# Patient Record
Sex: Female | Born: 1975 | Race: White | Hispanic: No | Marital: Single | Smoking: Never smoker
Health system: Southern US, Community
[De-identification: ages and names within clinical notes are randomized; demographics above are authoritative.]

## PROBLEM LIST (undated history)

## (undated) DIAGNOSIS — F319 Bipolar disorder, unspecified: Secondary | ICD-10-CM

## (undated) DIAGNOSIS — F419 Anxiety disorder, unspecified: Secondary | ICD-10-CM

## (undated) DIAGNOSIS — F329 Major depressive disorder, single episode, unspecified: Secondary | ICD-10-CM

## (undated) DIAGNOSIS — F32A Depression, unspecified: Secondary | ICD-10-CM

## (undated) DIAGNOSIS — F209 Schizophrenia, unspecified: Secondary | ICD-10-CM

## (undated) HISTORY — DX: Bipolar disorder, unspecified: F31.9

---

## 2002-10-21 ENCOUNTER — Other Ambulatory Visit: Admission: RE | Admit: 2002-10-21 | Discharge: 2002-10-21 | Payer: Self-pay | Admitting: Obstetrics and Gynecology

## 2007-08-31 ENCOUNTER — Emergency Department (HOSPITAL_COMMUNITY): Admission: EM | Admit: 2007-08-31 | Discharge: 2007-09-01 | Payer: Self-pay | Admitting: Emergency Medicine

## 2007-12-14 ENCOUNTER — Emergency Department (HOSPITAL_COMMUNITY): Admission: EM | Admit: 2007-12-14 | Discharge: 2007-12-14 | Payer: Self-pay | Admitting: Emergency Medicine

## 2007-12-18 ENCOUNTER — Emergency Department (HOSPITAL_COMMUNITY): Admission: EM | Admit: 2007-12-18 | Discharge: 2007-12-18 | Payer: Self-pay | Admitting: Emergency Medicine
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
## 2010-08-11 ENCOUNTER — Emergency Department (HOSPITAL_COMMUNITY)
Admission: EM | Admit: 2010-08-11 | Discharge: 2010-08-12 | Disposition: A | Discharge status: Other Acute Inpatient Hospital | Payer: Self-pay | Attending: Emergency Medicine | Admitting: Emergency Medicine

## 2010-08-11 DIAGNOSIS — R443 Hallucinations, unspecified: Secondary | ICD-10-CM | POA: Insufficient documentation

## 2010-08-11 DIAGNOSIS — F3130 Bipolar disorder, current episode depressed, mild or moderate severity, unspecified: Secondary | ICD-10-CM | POA: Insufficient documentation

## 2010-08-11 LAB — URINALYSIS, ROUTINE W REFLEX MICROSCOPIC
Ketones, ur: 15 mg/dL — AB
Nitrite: NEGATIVE
Protein, ur: NEGATIVE mg/dL
Specific Gravity, Urine: 1.033 — ABNORMAL HIGH (ref 1.005–1.030)

## 2010-08-11 LAB — URINE MICROSCOPIC-ADD ON

## 2010-08-11 LAB — RAPID URINE DRUG SCREEN, HOSP PERFORMED
Amphetamines: NOT DETECTED
Opiates: NOT DETECTED

## 2010-08-11 LAB — ETHANOL: Alcohol, Ethyl (B): <11 mg/dL (ref 0–11)

## 2010-08-11 LAB — DIFFERENTIAL
Monocytes Absolute: 0.8 10*3/uL (ref 0.1–1.0)
Neutro Abs: 7.4 10*3/uL (ref 1.7–7.7)
Neutrophils Relative %: 74 % (ref 43–77)

## 2010-08-11 LAB — COMPREHENSIVE METABOLIC PANEL
ALT: 12 U/L (ref 0–35)
AST: 15 U/L (ref 0–37)
Alkaline Phosphatase: 52 U/L (ref 39–117)
CO2: 23 mEq/L (ref 19–32)
Calcium: 9.2 mg/dL (ref 8.4–10.5)
Chloride: 103 mEq/L (ref 96–112)
GFR calc non Af Amer: >60 mL/min (ref >60)
Glucose, Bld: 118 mg/dL — ABNORMAL HIGH (ref 70–99)
Potassium: 3.7 mEq/L (ref 3.5–5.1)
Sodium: 140 mEq/L (ref 135–145)

## 2010-08-11 LAB — POCT PREGNANCY, URINE: Preg Test, Ur: NEGATIVE

## 2010-08-11 LAB — CBC: RDW: 12.9 % (ref 11.5–15.5)

## 2010-08-12 ENCOUNTER — Inpatient Hospital Stay (HOSPITAL_COMMUNITY)
Admission: EM | Admit: 2010-08-12 | Discharge: 2010-08-18 | DRG: 885 | Disposition: A | Discharge status: Home / Self Care | Payer: PRIVATE HEALTH INSURANCE | Source: Other Acute Inpatient Hospital | Attending: Psychiatry | Admitting: Psychiatry

## 2010-08-12 DIAGNOSIS — M549 Dorsalgia, unspecified: Secondary | ICD-10-CM

## 2010-08-12 DIAGNOSIS — F259 Schizoaffective disorder, unspecified: Principal | ICD-10-CM

## 2010-08-12 DIAGNOSIS — G8929 Other chronic pain: Secondary | ICD-10-CM

## 2010-08-12 DIAGNOSIS — N390 Urinary tract infection, site not specified: Secondary | ICD-10-CM

## 2010-08-12 DIAGNOSIS — F315 Bipolar disorder, current episode depressed, severe, with psychotic features: Secondary | ICD-10-CM

## 2010-08-13 DIAGNOSIS — F259 Schizoaffective disorder, unspecified: Secondary | ICD-10-CM

## 2010-08-13 DIAGNOSIS — F319 Bipolar disorder, unspecified: Secondary | ICD-10-CM

## 2010-08-18 NOTE — Discharge Summary (Signed)
Jessica [name], VRBA             ACCOUNT NO.:  [id number]  MEDICAL RECORD NO.:  [id number]  LOCATION:  0502                          FACILITY:  [facility]  PHYSICIAN:  Franchot Gallo, MD     DATE OF BIRTH:  11-28-1975  DATE OF ADMISSION:  08/12/2010 DATE OF DISCHARGE:  08/18/2010                              DISCHARGE SUMMARY   REASON FOR ADMISSION:  The patient was seen after being admitted for evaluation and treatment of her depression and command hallucinations instructing her to harm herself.  She admitted hearing voices but was unable to characterize what they were saying and having difficulty completing her sentences when trying to speak.  FINAL IMPRESSION:  Axis I:  Schizoaffective disorder, bipolar type. Axis II:  Deferred. Axis III: Chronic back pain. Axis IV:  Relationship problems secondary to chronic mental illness, and employment issues due to chronic mental illness. Axis V:  50-55.  PERTINENT LABS:  CBC within normal limits.  The patient had 21-50 WBC on the urinalysis.  SIGNIFICANT FINDINGS:  The patient was admitted to the adult milieu and was started on Risperdal 1 mg b.i.d. and one nightly.  We adjusted her medications to taking Risperdal at bedtime, adding Celexa for her depressive symptoms, and continuing with her Cipro for her urinary tract infection.  The patient was active in participating in groups.  We had contact with Marikay Alar, who the patient identified as her support person, to address any concerns and we provided information on suicide prevention.  She was having some difficulty with sleep and her appetite was fair; endorsing moderate depressive symptoms, rating at a 5 on a scale of 1-10, but denied any suicidal or homicidal thoughts, and her voices were continuing but much less.  She was having no side effects to her medications.  Her sleep was improved.  She was reporting some blurred vision.  Her appetite was fair but beginning to improve,  having no suicidal thoughts at all, and denied any auditory hallucinations.  We added Cogentin twice a day for her possible EPS.  On August 16, 2010, the patient's sleep was doing well until 3 a.m. and then she was having trouble sleeping and then was unable to return back to sleep.  She denied any suicidal thoughts, reporting "absolutely none."  Her blurred vision had resolved with the Cogentin.  On the day of discharge, the patient's sleep was good the night prior, her appetite was good and continuing to improve, and had mild depressive symptoms, rating at a 2 on a scale of 1-10.  She adamantly denied any suicidal or homicidal thoughts or auditory or visual hallucination or delusional thinking, having mild anxiety symptoms.  No medication side effects.  Her pain was at a zero and she was stable for discharged to aftercare.  DISCHARGE MEDICATIONS: 1. Cogentin 0.5 mg b.i.d. 2. Celexa 20 mg one daily. 3. Risperdal 2 mg nightly. 4. Tri-Sprintec 28-day one daily. 5. The patient was to stop taking her Wellbutrin and Abilify.  FOLLOW UP:  Her follow-up appointment was at [facility] on Monday, August 21, 2010, phone number [phone].  The patient was to walk in between the hours of 8 and 11.  Landry Corporal, N.P.   ______________________________ Franchot Gallo, MD    JO/MEDQ  D:  08/18/2010  T:  08/18/2010  Job:  [id number]  Electronically Signed by Limmie Patricia.P. on 08/18/2010 02:58:22 PM Electronically Signed by Franchot Gallo MD on 08/18/2010 04:03:20 PM
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
## 2010-11-23 LAB — CBC
Hemoglobin: 13.7
Platelets: 240
RBC: 4.37
RDW: 13.4
WBC: 11.9 — ABNORMAL HIGH

## 2010-11-23 LAB — URINALYSIS, ROUTINE W REFLEX MICROSCOPIC
Glucose, UA: NEGATIVE
Hgb urine dipstick: NEGATIVE
Nitrite: NEGATIVE
Protein, ur: NEGATIVE
Specific Gravity, Urine: 1.006

## 2010-11-23 LAB — COMPREHENSIVE METABOLIC PANEL
ALT: 16
AST: 30
Albumin: 4.4
Alkaline Phosphatase: 57
Calcium: 9.2
Creatinine, Ser: 0.7
Glucose, Bld: 127 — ABNORMAL HIGH
Potassium: 4.2
Sodium: 137
Total Bilirubin: 1

## 2010-11-23 LAB — DIFFERENTIAL
Basophils Absolute: 0.1
Basophils Relative: 1
Eosinophils Absolute: 0.1
Eosinophils Relative: 1
Lymphocytes Relative: 18
Lymphs Abs: 2.1
Monocytes Absolute: 0.9
Monocytes Relative: 8

## 2010-11-23 LAB — RAPID URINE DRUG SCREEN, HOSP PERFORMED
Benzodiazepines: NOT DETECTED
Opiates: NOT DETECTED

## 2010-11-23 LAB — POCT PREGNANCY, URINE: Preg Test, Ur: NEGATIVE

## 2011-05-19 ENCOUNTER — Ambulatory Visit: Payer: Self-pay | Admitting: Family Medicine

## 2011-05-19 VITALS — BP 146/84 | HR 97 | Temp 98.9°F | Resp 20 | Ht 65.0 in | Wt 203.0 lb

## 2011-05-19 DIAGNOSIS — R197 Diarrhea, unspecified: Secondary | ICD-10-CM

## 2011-05-19 NOTE — Progress Notes (Signed)
  Subjective:    Patient ID: Jessica [name], female    DOB: 1975/07/17, 36 y.o.   MRN: [id number]  HPI Jessica [name] is a 36 y.o. female Last week - Thursday - fatigue, bloating, stomach upset.  Slight diarrhea or loose stools.  No blood in stools.  no vomiting.  Subjective chills/subj fever.  Improved over 2-3 days.  (no further sx's 6 days go). Started again 2 days ago - diarrhea, loose stool, no blood, fatigue, no N/V, chills, but no known fever.  Diarrhea 2x/day.  TX: none.  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  No recent dose changes in meds.   Review of Systems  Constitutional: Positive for chills and appetite change. Negative for fever.       Drinking fluids.  HENT: Negative for congestion.   Respiratory: Negative for cough.   Gastrointestinal: Positive for diarrhea. Negative for nausea, vomiting, abdominal pain and abdominal distention.       Stomach upset, but no pain.  Genitourinary: Negative for dysuria, urgency, frequency, difficulty urinating and menstrual problem.   LMp 2/28 - normal.    Objective:   Physical Exam  Constitutional: She is oriented to person, place, and time. She appears well-developed and well-nourished. No distress.  HENT:  Head: Normocephalic and atraumatic.  Mouth/Throat: Oropharynx is clear and moist. No oropharyngeal exudate.  Eyes: EOM are normal. Pupils are equal, round, and reactive to light.  Neck: Normal range of motion.  Cardiovascular: Normal rate and regular rhythm.   Pulmonary/Chest: Effort normal.  Abdominal: Soft. She exhibits no distension. There is tenderness in the suprapubic area. There is no rebound and no guarding.    Neurological: She is alert and oriented to person, place, and time.  Skin: Skin is warm and dry. No rash noted.  Psychiatric: She has a normal mood and affect. Her behavior is normal.      Assessment &  Plan:  Jessica [name] is a 36 y.o. female Diarrheal illness with recurrence - suspected secondary viral gastroenteritis, including possible norovirus.  Appears well hydrated, minimal suprapubic ttp, but no urinary symptoms. H/o given on diarrhea tx, immodium +/- pepto otc prn, fluids and recheck in next 3 days if not improving - sooner if worse.

## 2011-06-08 ENCOUNTER — Encounter: Payer: Self-pay | Admitting: Family Medicine

## 2011-06-29 ENCOUNTER — Encounter: Payer: Self-pay | Admitting: Family Medicine

## 2011-06-29 ENCOUNTER — Ambulatory Visit: Payer: Self-pay | Admitting: Family Medicine

## 2011-06-29 VITALS — BP 120/83 | HR 65 | Temp 97.5°F | Resp 16 | Ht 64.0 in | Wt 199.8 lb

## 2011-06-29 DIAGNOSIS — Z0000 Encounter for general adult medical examination without abnormal findings: Secondary | ICD-10-CM

## 2011-06-29 NOTE — Progress Notes (Signed)
  Subjective:    Patient ID: Jessica [name], female    DOB: 07-27-1975, 36 y.o.   MRN: [id number]  HPI Jessica [name] is a 36 y.o. female Here for CPE. See patient health survey. Bipolar d/o - followed by [facility] ([place] [facility]).  Weight has been coming back down, off Risperdal, and Abilify as well.  Only taking lamictal, celexa, geodon.  Memory is less with these meds, but overall feels better on these.  45 minutes per day most days of the week.  No tobacco, no recent alcohol.  Review of Systems See scanned 13 point ROS on patient health survey.    Objective:   Physical Exam  Constitutional: She is oriented to person, place, and time. She appears well-developed and well-nourished.  HENT:  Head: Normocephalic and atraumatic.  Right Ear: External ear normal.  Left Ear: External ear normal.  Mouth/Throat: Oropharynx is clear and moist.  Eyes: Conjunctivae and EOM are normal. Pupils are equal, round, and reactive to light.  Neck: Normal range of motion. No thyromegaly present.  Cardiovascular: Normal rate, regular rhythm, normal heart sounds and intact distal pulses.   Pulmonary/Chest: Effort normal and breath sounds normal.  Abdominal: Soft. There is no tenderness.  Musculoskeletal: Normal range of motion. She exhibits no edema.  Lymphadenopathy:    She has no cervical adenopathy.  Neurological: She is alert and oriented to person, place, and time.  Skin: Skin is warm and dry.  Psychiatric: She has a normal mood and affect. Her speech is normal and behavior is normal. Judgment and thought content normal. Cognition and memory are normal.          Assessment & Plan:  Jessica [name] is a 36 y.o. female CPE.  Labwork defered as she has had lipids and electrolytes recently at [facility]). Will try to obtain copy of these. Hx of Bipolar disorder. Followed at [facility] for psychiatric care.   Follow up at [facility] for pap testing. Anticipatory guidance handout  given.

## 2012-06-13 ENCOUNTER — Telehealth: Payer: Self-pay

## 2012-06-13 NOTE — Telephone Encounter (Signed)
She is a pt of Dr. Neva Seat and she has a questions about what she should do because she was watching some cats, and the kittens got rabis after she was watching them.  Call back number is [phone]

## 2012-06-13 NOTE — Telephone Encounter (Signed)
To ER for Rabies vaccines. We do not do these here. Called her to advise.

## 2012-07-26 ENCOUNTER — Ambulatory Visit (INDEPENDENT_AMBULATORY_CARE_PROVIDER_SITE_OTHER): Payer: BC Managed Care – PPO | Admitting: Family Medicine

## 2012-07-26 VITALS — BP 138/88 | HR 102 | Temp 98.2°F | Resp 18 | Ht 65.0 in | Wt 214.0 lb

## 2012-07-26 DIAGNOSIS — J309 Allergic rhinitis, unspecified: Secondary | ICD-10-CM

## 2012-07-26 DIAGNOSIS — J301 Allergic rhinitis due to pollen: Secondary | ICD-10-CM

## 2012-07-26 MED ORDER — IPRATROPIUM BROMIDE 0.03 % NA SOLN: 2.0000 | Freq: Four times a day (QID) | NASAL | Status: DC

## 2012-07-26 MED ORDER — GUAIFENESIN ER 600 MG PO TB12: 1200.0000 mg | ORAL_TABLET | Freq: Two times a day (BID) | ORAL | Status: DC

## 2012-07-26 MED ORDER — FLUTICASONE PROPIONATE 50 MCG/ACT NA SUSP: 2.0000 | Freq: Every day | NASAL | Status: DC

## 2012-07-26 NOTE — Patient Instructions (Addendum)
Hot showers or breathing in steam may help loosen the congestion.  Using a netti pot or sinus rinse is also likely to help you feel better and keep this from progressing.  Use the atrovent nasal spray as needed throughout the day and use the fluticasone nasal spray every night before bed for at least 2 weeks.  I recommend augmenting with 12 hr sudafed (behind the counter) and generic mucinex to help you move out the congestion.  If no improvement or you are getting worse, come back as you might need a course of steroids but hopefully with all of the above, you can avoid it.  Continue on the 1/2 tab claritin every night before bed.  Hay Fever Hay fever is an allergic reaction to particles in the air. It cannot be passed from person to person. It cannot be cured, but it can be controlled. CAUSES  Hay fever is caused by something that triggers an allergic reaction (allergens). The following are examples of allergens:  Ragweed.  Feathers.  Animal dander.  Grass and tree pollens.  Cigarette smoke.  House dust.  Pollution. SYMPTOMS   Sneezing.  Runny or stuffy nose.  Tearing eyes.  Itchy eyes, nose, mouth, throat, skin, or other area.  Sore throat.  Headache.  Decreased sense of smell or taste. DIAGNOSIS Your caregiver will perform a physical exam and ask questions about the symptoms you are having.Allergy testing may be done to determine exactly what triggers your hay fever.  TREATMENT   Over-the-counter medicines may help symptoms. These include:  Antihistamines.  Decongestants. These may help with nasal congestion.  Your caregiver may prescribe medicines if over-the-counter medicines do not work.  Some people benefit from allergy shots when other medicines are not helpful. HOME CARE INSTRUCTIONS   Avoid the allergen that is causing your symptoms, if possible.  Take all medicine as told by your caregiver. SEEK MEDICAL CARE IF:   You have severe allergy symptoms  and your current medicines are not helping.  Your treatment was working at one time, but you are now experiencing symptoms.  You have sinus congestion and pressure.  You develop a fever or headache.  You have thick nasal discharge.  You have asthma and have a worsening cough and wheezing. SEEK IMMEDIATE MEDICAL CARE IF:   You have swelling of your tongue or lips.  You have trouble breathing.  You feel lightheaded or like you are going to faint.  You have cold sweats.  You have a fever. Document Released: 02/12/2005 Document Revised: 05/07/2011 Document Reviewed: 05/10/2010 [facility] Patient Information 2014 [place], [place].

## 2012-07-26 NOTE — Progress Notes (Signed)
Subjective:    Patient ID: Jessica [name], female    DOB: [birth year], 37 y.o.   MRN: [id number]   Chief Complaint  Patient presents with  . Cough  . Allergies    x Wednesday  . Fever    last night   HPI  Got illness from her mom who we saw last wk.  Developed sneezing, drainage, nasal congestion last wk - took zyrtec but was feeling more out of it so did have 1/2 tab claritin yesterday and had a fever last night up to 100 and has been coughing and sneezing more.  No sinus pressure but lots of clear mucous. No HA, ear pain, no sore throat, or jaw pain.  Is having cough productive of clear mucous.    Mom diagnosed w/ sinusitis - drainage etc by Dr. Conley Rolls.  Is also on Abilify but no other meds.  Past Medical History  Diagnosis Date  . Bipolar 1 disorder    Current Outpatient Prescriptions on File Prior to Visit  Medication Sig Dispense Refill  . ARIPiprazole (ABILIFY) 5 MG tablet Take 5 mg by mouth daily.      . citalopram (CELEXA) 40 MG tablet Take 40 mg by mouth daily.      Marland Kitchen lamoTRIgine (LAMICTAL) 25 MG tablet Take 25 mg by mouth daily.      . ziprasidone (GEODON) 40 MG capsule Take 40 mg by mouth 2 (two) times daily with a meal.       No current facility-administered medications on file prior to visit.   Allergies  Allergen Reactions  . Prednisone     Prednisone psychosis with anxiety.     Review of Systems  Constitutional: Positive for fever and fatigue. Negative for chills, diaphoresis, activity change and appetite change.  HENT: Positive for congestion, rhinorrhea, sneezing and postnasal drip. Negative for ear pain, nosebleeds, sore throat, trouble swallowing, neck pain, neck stiffness, dental problem, voice change, sinus pressure and ear discharge.   Eyes: Negative for discharge and itching.  Respiratory: Positive for cough. Negative for shortness of breath.   Cardiovascular: Negative for chest pain.  Gastrointestinal: Negative for nausea, vomiting and abdominal pain.   Skin: Negative for rash.  Neurological: Negative for dizziness, syncope and headaches.  Hematological: Positive for adenopathy.  Psychiatric/Behavioral: Positive for sleep disturbance.      BP 138/88  Pulse 102  Temp(Src) 98.2 F (36.8 C) (Oral)  Resp 18  Ht 5\' 5"  (1.651 m)  Wt 214 lb (97.07 kg)  BMI 35.61 kg/m2  SpO2 96%  LMP 07/07/2012 Objective:   Physical Exam  Constitutional: She is oriented to person, place, and time. She appears well-developed and well-nourished. She does not appear ill. No distress.  HENT:  Head: Normocephalic and atraumatic.  Right Ear: External ear and ear canal normal. Tympanic membrane is retracted.  Left Ear: External ear and ear canal normal. Tympanic membrane is retracted.  Nose: Mucosal edema and rhinorrhea present. Left sinus exhibits no maxillary sinus tenderness and no frontal sinus tenderness.  Mouth/Throat: Uvula is midline and mucous membranes are normal. Posterior oropharyngeal erythema present. No oropharyngeal exudate, posterior oropharyngeal edema or tonsillar abscesses.  Eyes: Conjunctivae are normal. Right eye exhibits no discharge. Left eye exhibits no discharge. No scleral icterus.  Neck: Normal range of motion. Neck supple.  Cardiovascular: Normal rate, regular rhythm, normal heart sounds and intact distal pulses.   Pulmonary/Chest: Effort normal and breath sounds normal.  Lymphadenopathy:       Head (right side): No  submandibular, no preauricular and no posterior auricular adenopathy present.       Head (left side): No submandibular, no preauricular and no posterior auricular adenopathy present.    She has no cervical adenopathy.       Right: No supraclavicular adenopathy present.       Left: No supraclavicular adenopathy present.  Neurological: She is alert and oriented to person, place, and time.  Skin: Skin is warm and dry. She is not diaphoretic. No erythema.  Psychiatric: She has a normal mood and affect. Her behavior is  normal.      Assessment & Plan:  Hay fever  Allergic rhinitis - prednisone allergy (causes agitation/anger) and no signs of bacterial infxn currently. Treat symptomatically. Try sinus rinse/netti pot. Very sensitive to med side effects so 1/2 tab claritin.   Meds ordered this encounter  Medications  . loratadine (CLARITIN) 5 MG chewable tablet    Sig: Chew 5 mg by mouth daily.  Marland Kitchen ipratropium (ATROVENT) 0.03 % nasal spray    Sig: Place 2 sprays into the nose 4 (four) times daily.    Dispense:  30 mL    Refill:  1  . fluticasone (FLONASE) 50 MCG/ACT nasal spray    Sig: Place 2 sprays into the nose at bedtime.    Dispense:  16 g    Refill:  6  . guaiFENesin (MUCINEX) 600 MG 12 hr tablet    Sig: Take 2 tablets (1,200 mg total) by mouth 2 (two) times daily.    Dispense:  60 tablet    Refill:  1

## 2013-03-19 ENCOUNTER — Ambulatory Visit (INDEPENDENT_AMBULATORY_CARE_PROVIDER_SITE_OTHER): Payer: BC Managed Care – PPO | Admitting: Emergency Medicine

## 2013-03-19 VITALS — BP 130/72 | HR 108 | Temp 98.0°F | Resp 16 | Ht 64.7 in | Wt 209.8 lb

## 2013-03-19 DIAGNOSIS — J111 Influenza due to unidentified influenza virus with other respiratory manifestations: Secondary | ICD-10-CM

## 2013-03-19 MED ORDER — OSELTAMIVIR PHOSPHATE 75 MG PO CAPS: 75.0000 mg | ORAL_CAPSULE | Freq: Two times a day (BID) | ORAL | Status: DC

## 2013-03-19 NOTE — Patient Instructions (Signed)

## 2013-03-19 NOTE — Progress Notes (Signed)
[facility] [address] [place] [address] [phone]  Date:  03/19/2013   Name:  Jessica [name]   DOB:  06/12/1975   MRN:  [id number]  PCP:  Shade Flood, MD    Chief Complaint: Chills, felt feverish and Generalized Body Aches   History of Present Illness:  Jessica [name] is a 38 y.o. very pleasant female patient who presents with the following:  Ill since yesterday with malaise, myalgias, and arthralgias.  Fatigued.  Today worse today.  Unable to go to work.  Has scant cough that is non productive.  No wheezing or shortness of breath.  No nausea or vomiting.  No stool change or rash.  Some nasal congestion and mild sore throat.  Had flu shot.  Extensive sick contact.  No improvement with over the counter medications or other home remedies. Denies other complaint or health concern today.   There are no active problems to display for this patient.   Past Medical History  Diagnosis Date  . Bipolar 1 disorder     History reviewed. No pertinent past surgical history.  History  Substance Use Topics  . Smoking status: Never Smoker   . Smokeless tobacco: Not on file  . Alcohol Use: No    Family History  Problem Relation Age of Onset  . Hypertension Mother     Allergies  Allergen Reactions  . Prednisone     Prednisone psychosis with anxiety.    Medication list has been reviewed and updated.  Current Outpatient Prescriptions on File Prior to Visit  Medication Sig Dispense Refill  . ARIPiprazole (ABILIFY) 5 MG tablet Take 15 mg by mouth daily.       Marland Kitchen lamoTRIgine (LAMICTAL) 25 MG tablet Take 25 mg by mouth daily.      . citalopram (CELEXA) 40 MG tablet Take 40 mg by mouth daily.      . fluticasone (FLONASE) 50 MCG/ACT nasal spray Place 2 sprays into the nose at bedtime.  16 g  6  . guaiFENesin (MUCINEX) 600 MG 12 hr tablet Take 2 tablets (1,200 mg total) by mouth 2 (two) times daily.  60 tablet  1  . ipratropium (ATROVENT) 0.03 %  nasal spray Place 2 sprays into the nose 4 (four) times daily.  30 mL  1  . loratadine (CLARITIN) 5 MG chewable tablet Chew 5 mg by mouth daily.      . ziprasidone (GEODON) 40 MG capsule Take 40 mg by mouth 2 (two) times daily with a meal.       No current facility-administered medications on file prior to visit.    Review of Systems:  As per HPI, otherwise negative.    Physical Examination: Filed Vitals:   03/19/13 1724  BP: 130/72  Pulse: 108  Temp: 98 F (36.7 C)  Resp: 16   Filed Vitals:   03/19/13 1724  Height: 5' 4.7" (1.643 m)  Weight: 209 lb 12.8 oz (95.165 kg)   Body mass index is 35.25 kg/(m^2). Ideal Body Weight: Weight in (lb) to have BMI = 25: 148.5  GEN: WDWN, NAD, Non-toxic, A & O x 3 HEENT: Atraumatic, Normocephalic. Neck supple. No masses, No LAD. Ears and Nose: No external deformity. CV: RRR, No M/G/R. No JVD. No thrill. No extra heart sounds. PULM: CTA B, no wheezes, crackles, rhonchi. No retractions. No resp. distress. No accessory muscle use. ABD: S, NT, ND, +BS. No rebound. No HSM. EXTR: No c/c/e NEURO Normal gait.  PSYCH: Normally interactive. Conversant. Not depressed or anxious appearing.  Calm demeanor.    Assessment and Plan: Influenza tamiflu  Signed,  Phillips OdorJeffery Zakariya Knickerbocker, MD

## 2015-06-27 DIAGNOSIS — F4310 Post-traumatic stress disorder, unspecified: Secondary | ICD-10-CM | POA: Diagnosis not present

## 2015-06-27 DIAGNOSIS — F3181 Bipolar II disorder: Secondary | ICD-10-CM | POA: Diagnosis not present

## 2015-07-25 ENCOUNTER — Encounter (HOSPITAL_COMMUNITY): Payer: Self-pay | Admitting: Emergency Medicine

## 2015-07-25 ENCOUNTER — Encounter (HOSPITAL_COMMUNITY): Payer: Self-pay | Admitting: *Deleted

## 2015-07-25 ENCOUNTER — Inpatient Hospital Stay (HOSPITAL_COMMUNITY)
Admission: AD | Admit: 2015-07-25 | Discharge: 2015-07-29 | DRG: 885 | Disposition: A | Discharge status: Home / Self Care | Payer: BLUE CROSS/BLUE SHIELD | Source: Intra-hospital | Attending: Psychiatry | Admitting: Psychiatry

## 2015-07-25 ENCOUNTER — Emergency Department (HOSPITAL_COMMUNITY)
Admission: EM | Admit: 2015-07-25 | Discharge: 2015-07-25 | Disposition: A | Discharge status: Other Acute Inpatient Hospital | Payer: BLUE CROSS/BLUE SHIELD | Attending: Emergency Medicine | Admitting: Emergency Medicine

## 2015-07-25 DIAGNOSIS — Z79899 Other long term (current) drug therapy: Secondary | ICD-10-CM | POA: Insufficient documentation

## 2015-07-25 DIAGNOSIS — F250 Schizoaffective disorder, bipolar type: Secondary | ICD-10-CM | POA: Diagnosis not present

## 2015-07-25 DIAGNOSIS — F3130 Bipolar disorder, current episode depressed, mild or moderate severity, unspecified: Secondary | ICD-10-CM | POA: Diagnosis not present

## 2015-07-25 DIAGNOSIS — Z915 Personal history of self-harm: Secondary | ICD-10-CM | POA: Diagnosis not present

## 2015-07-25 DIAGNOSIS — F329 Major depressive disorder, single episode, unspecified: Secondary | ICD-10-CM | POA: Diagnosis not present

## 2015-07-25 DIAGNOSIS — G4700 Insomnia, unspecified: Secondary | ICD-10-CM | POA: Diagnosis present

## 2015-07-25 DIAGNOSIS — F418 Other specified anxiety disorders: Secondary | ICD-10-CM | POA: Diagnosis not present

## 2015-07-25 LAB — RAPID URINE DRUG SCREEN, HOSP PERFORMED
Amphetamines: NOT DETECTED
Barbiturates: NOT DETECTED
Benzodiazepines: NOT DETECTED
Cocaine: NOT DETECTED
Opiates: NOT DETECTED
Tetrahydrocannabinol: NOT DETECTED

## 2015-07-25 LAB — CBC
HEMATOCRIT: 41 % (ref 36.0–46.0)
HEMOGLOBIN: 13.9 g/dL (ref 12.0–15.0)
MCH: 29.4 pg (ref 26.0–34.0)
MCHC: 33.9 g/dL (ref 30.0–36.0)
MCV: 86.9 fL (ref 78.0–100.0)
Platelets: 325 10*3/uL (ref 150–400)
RBC: 4.72 MIL/uL (ref 3.87–5.11)
RDW: 13.2 % (ref 11.5–15.5)
WBC: 12.3 10*3/uL — ABNORMAL HIGH (ref 4.0–10.5)

## 2015-07-25 LAB — COMPREHENSIVE METABOLIC PANEL
ALBUMIN: 5 g/dL (ref 3.5–5.0)
ALK PHOS: 82 U/L (ref 38–126)
ALT: 18 U/L (ref 14–54)
AST: 18 U/L (ref 15–41)
Anion gap: 12 (ref 5–15)
BUN: 15 mg/dL (ref 6–20)
CALCIUM: 8.8 mg/dL — AB (ref 8.9–10.3)
CO2: 21 mmol/L — AB (ref 22–32)
CREATININE: 0.72 mg/dL (ref 0.44–1.00)
Chloride: 107 mmol/L (ref 101–111)
GFR calc non Af Amer: >60 mL/min (ref >60)
GLUCOSE: 130 mg/dL — AB (ref 65–99)
Potassium: 3.8 mmol/L (ref 3.5–5.1)
SODIUM: 140 mmol/L (ref 135–145)
Total Bilirubin: 0.6 mg/dL (ref 0.3–1.2)
Total Protein: 8.1 g/dL (ref 6.5–8.1)

## 2015-07-25 LAB — URINALYSIS, ROUTINE W REFLEX MICROSCOPIC
Bilirubin Urine: NEGATIVE
Glucose, UA: NEGATIVE mg/dL
Hgb urine dipstick: NEGATIVE
Ketones, ur: >80 mg/dL — AB
Nitrite: NEGATIVE
Protein, ur: 30 mg/dL — AB
Specific Gravity, Urine: 1.041 — ABNORMAL HIGH (ref 1.005–1.030)
pH: 6.5 (ref 5.0–8.0)

## 2015-07-25 LAB — ACETAMINOPHEN LEVEL: Acetaminophen (Tylenol), Serum: <10 ug/mL — ABNORMAL LOW (ref 10–30)

## 2015-07-25 LAB — SALICYLATE LEVEL: Salicylate Lvl: <4 mg/dL (ref 2.8–30.0)

## 2015-07-25 LAB — ETHANOL: Alcohol, Ethyl (B): <5 mg/dL (ref <5)

## 2015-07-25 LAB — I-STAT BETA HCG BLOOD, ED (MC, WL, AP ONLY): I-stat hCG, quantitative: <5 m[IU]/mL (ref <5)

## 2015-07-25 LAB — URINE MICROSCOPIC-ADD ON

## 2015-07-25 MED ORDER — ALUM & MAG HYDROXIDE-SIMETH 200-200-20 MG/5ML PO SUSP: 30.0000 mL | ORAL | Status: DC | PRN

## 2015-07-25 MED ORDER — ACETAMINOPHEN 325 MG PO TABS: 650.0000 mg | ORAL_TABLET | Freq: Four times a day (QID) | ORAL | Status: DC | PRN

## 2015-07-25 MED ORDER — BUPROPION HCL ER (XL) 150 MG PO TB24
150.0000 mg | ORAL_TABLET | Freq: Every day | ORAL | Status: DC
  Administered 2015-07-25: 150 mg via ORAL
  Filled 2015-07-25: qty 1

## 2015-07-25 MED ORDER — MAGNESIUM HYDROXIDE 400 MG/5ML PO SUSP: 30.0000 mL | Freq: Every day | ORAL | Status: DC | PRN

## 2015-07-25 MED ORDER — BUPROPION HCL ER (XL) 150 MG PO TB24
150.0000 mg | ORAL_TABLET | Freq: Every day | ORAL | Status: DC
  Administered 2015-07-26 – 2015-07-29 (×4): 150 mg via ORAL
  Filled 2015-07-25 (×7): qty 1

## 2015-07-25 MED ORDER — HYDROXYZINE HCL 25 MG PO TABS
25.0000 mg | ORAL_TABLET | Freq: Four times a day (QID) | ORAL | Status: DC | PRN
  Administered 2015-07-25: 25 mg via ORAL
  Filled 2015-07-25 (×2): qty 1

## 2015-07-25 MED ORDER — VITAMIN D3 25 MCG (1000 UNIT) PO TABS
1000.0000 [IU] | ORAL_TABLET | Freq: Every day | ORAL | Status: DC
  Filled 2015-07-25: qty 1

## 2015-07-25 MED ORDER — LURASIDONE HCL 20 MG PO TABS
30.0000 mg | ORAL_TABLET | Freq: Every day | ORAL | Status: DC
  Filled 2015-07-25 (×2): qty 2

## 2015-07-25 MED ORDER — VITAMIN D3 25 MCG (1000 UNIT) PO TABS
1000.0000 [IU] | ORAL_TABLET | Freq: Every day | ORAL | Status: DC
  Administered 2015-07-26 – 2015-07-29 (×4): 1000 [IU] via ORAL
  Filled 2015-07-25 (×7): qty 1

## 2015-07-25 MED ORDER — LURASIDONE HCL 20 MG PO TABS
30.0000 mg | ORAL_TABLET | Freq: Every day | ORAL | Status: DC
  Filled 2015-07-25: qty 2

## 2015-07-25 MED ORDER — TRAZODONE HCL 50 MG PO TABS
50.0000 mg | ORAL_TABLET | Freq: Every evening | ORAL | Status: DC | PRN
  Administered 2015-07-25: 50 mg via ORAL
  Filled 2015-07-25 (×8): qty 1
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
## 2015-07-25 NOTE — ED Provider Notes (Signed)
CSN: [id number]     Arrival date & time 07/25/15  7829 History   First MD Initiated Contact with Patient 07/25/15 [phone]     Chief Complaint  Patient presents with  . Depression     (Consider location/radiation/quality/duration/timing/severity/associated sxs/prior Treatment) HPI   Patient is a 40 year old female with a history of bipolar 1 disorder who presents the ED with depression, anxiety, feeling overwhelmed for 3 days. Patient states she is not sure why she is feeling this way, she says nothing has changed recently in her life to cause these feelings. She states she is unsure if she's had thoughts of suicide or self-harm but she denies having a plan. She denies homicidal ideations. She endorses mild lower abdominal pain for 3 days that is crampy and constant. She states associated patient. She denies nausea, vomiting, urinary symptoms, blood in her stool, flank pain.  Past Medical History  Diagnosis Date  . Bipolar 1 disorder (HCC)    History reviewed. No pertinent past surgical history. Family History  Problem Relation Age of Onset  . Hypertension Mother    Social History  Substance Use Topics  . Smoking status: Never Smoker   . Smokeless tobacco: None  . Alcohol Use: No   OB History    No data available     Review of Systems  Constitutional: Negative for fever and chills.  Respiratory: Negative for cough and chest tightness.   Cardiovascular: Negative for chest pain.  Gastrointestinal: Positive for abdominal pain and constipation. Negative for nausea, vomiting and blood in stool.  Genitourinary: Negative for dysuria and hematuria.  Musculoskeletal: Negative for back pain.  Neurological: Negative for syncope.  Psychiatric/Behavioral: Positive for dysphoric mood. The patient is nervous/anxious.       Allergies  Prednisone  Home Medications   Prior to Admission medications   Medication Sig Start Date End Date Taking? Authorizing Provider  buPROPion (WELLBUTRIN  XL) 150 MG 24 hr tablet Take 150 mg by mouth daily.   Yes Historical Provider, MD  cholecalciferol (VITAMIN D) 1000 units tablet Take 1,000 Units by mouth daily.   Yes Historical Provider, MD  ibuprofen (ADVIL,MOTRIN) 200 MG tablet Take 200 mg by mouth every 6 (six) hours as needed for mild pain.   Yes Historical Provider, MD  Lurasidone HCl (LATUDA PO) Take 30 mg by mouth daily.   Yes Historical Provider, MD  fluticasone (FLONASE) 50 MCG/ACT nasal spray Place 2 sprays into the nose at bedtime. Patient not taking: Reported on 07/25/2015 07/26/12   Sherren Mocha, MD  guaiFENesin (MUCINEX) 600 MG 12 hr tablet Take 2 tablets (1,200 mg total) by mouth 2 (two) times daily. Patient not taking: Reported on 07/25/2015 07/26/12   Sherren Mocha, MD  ipratropium (ATROVENT) 0.03 % nasal spray Place 2 sprays into the nose 4 (four) times daily. Patient not taking: Reported on 07/25/2015 07/26/12   Sherren Mocha, MD  oseltamivir (TAMIFLU) 75 MG capsule Take 1 capsule (75 mg total) by mouth 2 (two) times daily. Patient not taking: Reported on 07/25/2015 03/19/13   Carmelina Dane, MD   BP 154/96 mmHg  Pulse 106  Temp(Src) 98 F (36.7 C) (Oral)  Resp 17  SpO2 98% Physical Exam  Constitutional: She appears well-developed and well-nourished. No distress.  Somber appearing, tearful at times  HENT:  Head: Normocephalic and atraumatic.  Eyes: Conjunctivae are normal.  Cardiovascular: Normal rate, regular rhythm and normal heart sounds.  Exam reveals no gallop and no friction rub.  No murmur heard. Pulmonary/Chest: Effort normal and breath sounds normal. No respiratory distress. She has no wheezes. She has no rales.  Abdominal: Soft. Normal appearance and bowel sounds are normal. She exhibits no distension. There is no rigidity and no guarding.  Mild TTP of the RLQ  Neurological: She is alert. Coordination normal.  Skin: She is not diaphoretic.  Psychiatric: Her speech is normal. She exhibits a depressed mood. She  expresses no homicidal ideation. She expresses no suicidal plans.    ED Course  Procedures (including critical care time) Labs Review Labs Reviewed  COMPREHENSIVE METABOLIC PANEL - Abnormal; Notable for the following:    CO2 21 (*)    Glucose, Bld 130 (*)    Calcium 8.8 (*)    All other components within normal limits  ACETAMINOPHEN LEVEL - Abnormal; Notable for the following:    Acetaminophen (Tylenol), Serum <10 (*)    All other components within normal limits  CBC - Abnormal; Notable for the following:    WBC 12.3 (*)    All other components within normal limits  URINALYSIS, ROUTINE W REFLEX MICROSCOPIC (NOT AT [facility]) - Abnormal; Notable for the following:    Color, Urine AMBER (*)    APPearance CLOUDY (*)    Specific Gravity, Urine 1.041 (*)    Ketones, ur >80 (*)    Protein, ur 30 (*)    Leukocytes, UA MODERATE (*)    All other components within normal limits  URINE MICROSCOPIC-ADD ON - Abnormal; Notable for the following:    Squamous Epithelial / LPF 6-30 (*)    Bacteria, UA RARE (*)    All other components within normal limits  ETHANOL  SALICYLATE LEVEL  URINE RAPID DRUG SCREEN, HOSP PERFORMED  I-STAT BETA HCG BLOOD, ED ([facility], [facility], [facility] ONLY)    Imaging Review No results found. I have personally reviewed and evaluated these images and lab results as part of my medical decision-making.   EKG Interpretation None      MDM   Final diagnoses:  Schizoaffective disorder, bipolar type (HCC)   Pt medically cleared and ready for psych evaluation and transfer. Labs unremarkable. Ketones in the urine with a high specific gravity indicate mild dehydration but no anion gap. Mild nonspecific leukocytosis, pt afebrile, VSS with mild tachycardia. Pt only complaint is constipation with mild abdominal discomfort.     Jerre SimonJessica L Aldahir Litaker, PA 07/25/15 1535  Melene Planan Floyd, DO 07/26/15 [phone]

## 2015-07-25 NOTE — ED Notes (Signed)
Patient unable to provide urine sample at this time

## 2015-07-25 NOTE — ED Notes (Signed)
Patient with Hx of bipolar disorder c/o depression and anxiety x 3 days, unsure if she has thoughts of self-harm, unable to contract for safety. No AVH/HI. No recent alcohol or drug use. Has been using mental health medications as prescribed, until this morning. Complains of some abdominal pain, attributes this to stress.

## 2015-07-25 NOTE — BH Assessment (Signed)
[facility] Assessment Progress Note  Per Thedore MinsMojeed Akintayo, MD, this pt requires psychiatric hospitalization at this time.  Berneice Heinrichina Tate, RN, [facility]; they will be ready to receive pt at 15:00.  Pt has signed Voluntary Admission and Consent for Treatment, as well as Consent to Release Information, and signed [facility].  Pt's nurse has been notified, and agrees to send original paperwork along with pt via [place], and to call report to [phone].  Doylene Canninghomas Jatasia Gundrum, MA Triage Specialist [phone]
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
## 2015-07-25 NOTE — Progress Notes (Signed)
Adult Psychoeducational Group Note  Date:  07/25/2015 Time:  8:20 PM  Group Topic/Focus:  Wrap-Up Group:   The focus of this group is to help patients review their daily goal of treatment and discuss progress on daily workbooks.  Participation Level:  Did Not Attend  Pt did not attend wrap-up group. Pt was in bed.    Cleotilde NeerJasmine S Seon Gaertner 07/25/2015, 8:57 PM

## 2015-07-25 NOTE — Progress Notes (Addendum)
Dr. Jannifer FranklinAkintayo and Nanine MeansJamison Lord, NP recommend INPT treatment. [facility] No [facility] beds at this time. TTS to seek placement.

## 2015-07-25 NOTE — Tx Team (Signed)
Initial Interdisciplinary Treatment Plan   PATIENT STRESSORS: Auditory Hallucinations   PATIENT STRENGTHS: Motivation for treatment/growth Physical Health Supportive family/friends   PROBLEM LIST: Problem List/Patient Goals Date to be addressed Date deferred Reason deferred Estimated date of resolution  Psychosis 07/25/2015  07/25/2015   D/C  Depression 07/25/2015  07/25/2015   D/C  Anxiety 07/25/2015  07/25/2015   D/C  "I want to work on my anxiety" 07/25/2015  07/25/2015   D/C  "I want to work on my depression" 07/25/2015  07/25/2015   D/C                           DISCHARGE CRITERIA:  Ability to meet basic life and health needs Adequate post-discharge living arrangements Improved stabilization in mood, thinking, and/or behavior Medical problems require only outpatient monitoring Motivation to continue treatment in a less acute level of care Need for constant or close observation no longer present  PRELIMINARY DISCHARGE PLAN: Outpatient therapy Return to previous living arrangement Return to previous work or school arrangements  PATIENT/FAMIILY INVOLVEMENT: This treatment plan has been presented to and reviewed with the patient, Jessica [name].  The patient and family have been given the opportunity to ask questions and make suggestions.  Jessica [name], Jessica [name] 07/25/2015, 7:32 PM

## 2015-07-26 ENCOUNTER — Encounter (HOSPITAL_COMMUNITY): Payer: Self-pay | Admitting: Psychiatry

## 2015-07-26 DIAGNOSIS — F3130 Bipolar disorder, current episode depressed, mild or moderate severity, unspecified: Secondary | ICD-10-CM | POA: Diagnosis present

## 2015-07-26 MED ORDER — LURASIDONE HCL 20 MG PO TABS
20.0000 mg | ORAL_TABLET | Freq: Every day | ORAL | Status: DC
  Administered 2015-07-26: 20 mg via ORAL
  Filled 2015-07-26 (×4): qty 1

## 2015-07-26 MED ORDER — TRAZODONE HCL 50 MG PO TABS: 50.0000 mg | ORAL_TABLET | Freq: Every evening | ORAL | Status: DC | PRN
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
## 2015-07-26 NOTE — Progress Notes (Signed)
[facility] Group Notes:  (Nursing/MHT/Case Management/Adjunct)  Date:  07/26/2015  Time:  2100  Type of Therapy:  wrap up group   Participation Level:  Minimal  Participation Quality:  Attentive, Resistant and Supportive  Affect:  Anxious and Flat  Cognitive:  Appropriate  Insight:  Lacking  Engagement in Group:  Engaged  Modes of Intervention:  Clarification, Education and Support  Summary of Progress/Problems: Pt reported being new to the unit and felt welcomed to the unit and slowly feeling more comfortable around the other patients. Pt reports having decreased sleep and increased anxiety and depression before coming in to the hospital. Pt shared that her mother saw warning signs of increased depression in her that the pt has experienced before and they came to the hospital. Pt shared that she wants to obtain more tools for coping with her depression while here and continue to see her therapist twice a week.   Marcille BuffyMcNeil, Anarie Kalish S 07/26/2015, 9:49 PM

## 2015-07-26 NOTE — H&P (Signed)
Psychiatric Admission Assessment Adult  Patient Identification: Jessica [name] MRN:  [id number] Date of Evaluation:  07/26/2015 Chief Complaint:   " I felt overwhelmed " Principal Diagnosis:  Bipolar Disorder , Depressed  Diagnosis:   Patient Active Problem List   Diagnosis Date Noted  . Schizoaffective disorder, bipolar type ([place]) [F25.0] 07/25/2015   History of Present Illness:40 year old  Female, who reports having history of  Bipolar Disorder, states she felt overwhelmed, anxious, and states she had not been sleeping well over the last couple of weeks. States she initially felt as if she was in a " high" with increased energy, feeling " energized", some increased irritability. The last few days , however, have been more consistent with depression, and at this time endorses some depression .  Of note, denies any psychotic symptoms, denies any suicidal ideations . Of note, she cannot identify and clear triggers that have contributed to current exacerbation. She is compliant with her medication regimen. She denies any alcohol or drug abuse.  Associated Signs/Symptoms: Depression Symptoms:  depressed mood, insomnia, decreased appetite, (Hypo) Manic Symptoms:  Denies any manic symptoms at this time  Anxiety Symptoms:  States she worries excessively, denies panic or agoraphobia Psychotic Symptoms:  Denies  PTSD Symptoms: Denies   Total Time spent with patient: 45 minutes  Past Psychiatric History:  History of Bipolar Disorder, two prior psychiatric admissions ( 2009, 2011 ) , but none recent . States " I had been doing pretty good for a while ". History of suicide attempt by overdosing, denies history of self cutting. Reports history of steroid psychosis episode years ago.  Denies history of violence   Is the patient at risk to self? No.  Has the patient been a risk to self in the past 6 months? No.  Has the patient been a risk to self within the distant past? Yes.    Is the  patient a risk to others? No.  Has the patient been a risk to others in the past 6 months? No.  Has the patient been a risk to others within the distant past? No.   Prior Inpatient Therapy:  as above  Prior Outpatient Therapy:  Sees Dr. Lita Mains for outpatient psychiatric medications, has a therapist at [facility]   Alcohol Screening: 1. How often do you have a drink containing alcohol?: Never 9. Have you or someone else been injured as a result of your drinking?: No 10. Has a relative or friend or a doctor or another health worker been concerned about your drinking or suggested you cut down?: No Alcohol Use Disorder Identification Test Final Score (AUDIT): 0 Brief Intervention: AUDIT score less than 7 or less-screening does not suggest unhealthy drinking-brief intervention not indicated Substance Abuse History in the last 12 months:  Denies any alcohol or drug abuse . Consequences of Substance Abuse: Denies  Previous Psychotropic Medications: she had been on combination of Abilify/ Wellbutrin up to a few months ago, when she was changed to Latuda/Wellbutrin.  States Abilify causing her increased anxiety.  Other medications in the past were Klonopin, Risperidone Psychological Evaluations: No  Past Medical History: history of L5 disc displacement, causing intermittent back pain. History of Prednisone induced psychosis. Past Medical History  Diagnosis Date  . Bipolar 1 disorder ([place])    History reviewed. No pertinent past surgical history. Family History:  Father passed away from complications of hip surgery, lives with mother, no siblings Family History  Problem Relation Age of Onset  . Hypertension  Mother    Family Psychiatric  History:  Suspects father was Bipolar but never formally diagnosed, no substance abuse, no suicides in family  Tobacco Screening:  Does not smoke  Social History: Single,  no children, lives with mother, employed,  Denies legal issues . States she has a good  relationship with her mother . History  Alcohol Use No     History  Drug Use No    Additional Social History:  Allergies:   Allergies  Allergen Reactions  . Prednisone     Prednisone psychosis with anxiety.   Lab Results:  Results for orders placed or performed during the hospital encounter of 07/25/15 (from the past 48 hour(s))  Comprehensive metabolic panel     Status: Abnormal   Collection Time: 07/25/15  9:14 AM  Result Value Ref Range   Sodium 140 135 - 145 mmol/L   Potassium 3.8 3.5 - 5.1 mmol/L   Chloride 107 101 - 111 mmol/L   CO2 21 (L) 22 - 32 mmol/L   Glucose, Bld 130 (H) 65 - 99 mg/dL   BUN 15 6 - 20 mg/dL   Creatinine, Ser 0.72 0.44 - 1.00 mg/dL   Calcium 8.8 (L) 8.9 - 10.3 mg/dL   Total Protein 8.1 6.5 - 8.1 g/dL   Albumin 5.0 3.5 - 5.0 g/dL   AST 18 15 - 41 U/L   ALT 18 14 - 54 U/L   Alkaline Phosphatase 82 38 - 126 U/L   Total Bilirubin 0.6 0.3 - 1.2 mg/dL   GFR calc non Af Amer >60 >60 mL/min   GFR calc Af Amer >60 >60 mL/min    Comment: (NOTE) The eGFR has been calculated using the CKD EPI equation. This calculation has not been validated in all clinical situations. eGFR's persistently <60 mL/min signify possible Chronic Kidney Disease.    Anion gap 12 5 - 15  Ethanol     Status: None   Collection Time: 07/25/15  9:14 AM  Result Value Ref Range   Alcohol, Ethyl (B) <5 <5 mg/dL    Comment:        LOWEST DETECTABLE LIMIT FOR SERUM ALCOHOL IS 5 mg/dL FOR MEDICAL PURPOSES ONLY   Salicylate level     Status: None   Collection Time: 07/25/15  9:14 AM  Result Value Ref Range   Salicylate Lvl <2.9 2.8 - 30.0 mg/dL  Acetaminophen level     Status: Abnormal   Collection Time: 07/25/15  9:14 AM  Result Value Ref Range   Acetaminophen (Tylenol), Serum <10 (L) 10 - 30 ug/mL    Comment:        THERAPEUTIC CONCENTRATIONS VARY SIGNIFICANTLY. A RANGE OF 10-30 ug/mL MAY BE AN EFFECTIVE CONCENTRATION FOR MANY PATIENTS. HOWEVER, SOME ARE BEST TREATED AT  CONCENTRATIONS OUTSIDE THIS RANGE. ACETAMINOPHEN CONCENTRATIONS >150 ug/mL AT 4 HOURS AFTER INGESTION AND >50 ug/mL AT 12 HOURS AFTER INGESTION ARE OFTEN ASSOCIATED WITH TOXIC REACTIONS.   cbc     Status: Abnormal   Collection Time: 07/25/15  9:14 AM  Result Value Ref Range   WBC 12.3 (H) 4.0 - 10.5 K/uL   RBC 4.72 3.87 - 5.11 MIL/uL   Hemoglobin 13.9 12.0 - 15.0 g/dL   HCT 41.0 36.0 - 46.0 %   MCV 86.9 78.0 - 100.0 fL   MCH 29.4 26.0 - 34.0 pg   MCHC 33.9 30.0 - 36.0 g/dL   RDW 13.2 11.5 - 15.5 %   Platelets 325 150 - 400 K/uL  I-Stat beta hCG blood, ED     Status: None   Collection Time: 07/25/15  9:23 AM  Result Value Ref Range   I-stat hCG, quantitative <5.0 <5 mIU/mL   Comment 3            Comment:   GEST. AGE      CONC.  (mIU/mL)   <=1 WEEK        5 - 50     2 WEEKS       50 - 500     3 WEEKS       100 - 10,000     4 WEEKS     1,000 - 30,000        FEMALE AND NON-PREGNANT FEMALE:     LESS THAN 5 mIU/mL   Rapid urine drug screen (hospital performed)     Status: None   Collection Time: 07/25/15  2:18 PM  Result Value Ref Range   Opiates NONE DETECTED NONE DETECTED   Cocaine NONE DETECTED NONE DETECTED   Benzodiazepines NONE DETECTED NONE DETECTED   Amphetamines NONE DETECTED NONE DETECTED   Tetrahydrocannabinol NONE DETECTED NONE DETECTED   Barbiturates NONE DETECTED NONE DETECTED    Comment:        DRUG SCREEN FOR MEDICAL PURPOSES ONLY.  IF CONFIRMATION IS NEEDED FOR ANY PURPOSE, NOTIFY LAB WITHIN 5 DAYS.        LOWEST DETECTABLE LIMITS FOR URINE DRUG SCREEN Drug Class       Cutoff (ng/mL) Amphetamine      1000 Barbiturate      200 Benzodiazepine   578 Tricyclics       469 Opiates          300 Cocaine          300 THC              50   Urinalysis, Routine w reflex microscopic (not at [facility])     Status: Abnormal   Collection Time: 07/25/15  2:18 PM  Result Value Ref Range   Color, Urine AMBER (A) YELLOW    Comment: BIOCHEMICALS MAY BE AFFECTED BY  COLOR   APPearance CLOUDY (A) CLEAR   Specific Gravity, Urine 1.041 (H) 1.005 - 1.030   pH 6.5 5.0 - 8.0   Glucose, UA NEGATIVE NEGATIVE mg/dL   Hgb urine dipstick NEGATIVE NEGATIVE   Bilirubin Urine NEGATIVE NEGATIVE   Ketones, ur >80 (A) NEGATIVE mg/dL   Protein, ur 30 (A) NEGATIVE mg/dL   Nitrite NEGATIVE NEGATIVE   Leukocytes, UA MODERATE (A) NEGATIVE  Urine microscopic-add on     Status: Abnormal   Collection Time: 07/25/15  2:18 PM  Result Value Ref Range   Squamous Epithelial / LPF 6-30 (A) NONE SEEN   WBC, UA 6-30 0 - 5 WBC/hpf   RBC / HPF 0-5 0 - 5 RBC/hpf   Bacteria, UA RARE (A) NONE SEEN   Urine-Other MUCOUS PRESENT     Blood Alcohol level:  Lab Results  Component Value Date   ETH <5 07/25/2015   ETH <11 62/95/2841    Metabolic Disorder Labs:  No results found for: HGBA1C, MPG No results found for: PROLACTIN No results found for: CHOL, TRIG, HDL, CHOLHDL, VLDL, LDLCALC  Current Medications: Current Facility-Administered Medications  Medication Dose Route Frequency Provider Last Rate Last Dose  . acetaminophen (TYLENOL) tablet 650 mg  650 mg Oral Q6H PRN Patrecia Pour, NP      . alum & mag hydroxide-simeth (  MAALOX/MYLANTA) 200-200-20 MG/5ML suspension 30 mL  30 mL Oral Q4H PRN Patrecia Pour, NP      . buPROPion (WELLBUTRIN XL) 24 hr tablet 150 mg  150 mg Oral Daily Patrecia Pour, NP   150 mg at 07/26/15 0745  . cholecalciferol (VITAMIN D) tablet 1,000 Units  1,000 Units Oral Daily Patrecia Pour, NP   1,000 Units at 07/26/15 0745  . hydrOXYzine (ATARAX/VISTARIL) tablet 25 mg  25 mg Oral Q6H PRN Laverle Hobby, PA-C   25 mg at 07/25/15 2208  . lurasidone (LATUDA) tablet 20 mg  20 mg Oral Q breakfast Jenne Campus, MD   20 mg at 07/26/15 1133  . magnesium hydroxide (MILK OF MAGNESIA) suspension 30 mL  30 mL Oral Daily PRN Patrecia Pour, NP      . traZODone (DESYREL) tablet 50 mg  50 mg Oral QHS,MR X 1 Laverle Hobby, PA-C   50 mg at 07/25/15 2208   PTA  Medications: Prescriptions prior to admission  Medication Sig Dispense Refill Last Dose  . buPROPion (WELLBUTRIN XL) 150 MG 24 hr tablet Take 150 mg by mouth daily.   07/24/2015 at Unknown time  . cholecalciferol (VITAMIN D) 1000 units tablet Take 1,000 Units by mouth daily.   07/24/2015 at Unknown time  . ibuprofen (ADVIL,MOTRIN) 200 MG tablet Take 200 mg by mouth every 6 (six) hours as needed for mild pain.   Past Month at Unknown time  . ipratropium (ATROVENT) 0.03 % nasal spray Place 2 sprays into the nose 4 (four) times daily. (Patient not taking: Reported on 07/25/2015) 30 mL 1   . Lurasidone HCl (LATUDA PO) Take 30 mg by mouth daily.   07/24/2015 at Unknown time    Musculoskeletal: Strength & Muscle Tone: within normal limits Gait & Station: normal Patient leans: N/A  Psychiatric Specialty Exam: Physical Exam  Review of Systems  Constitutional: Negative.   Eyes: Negative.   Respiratory: Negative.   Cardiovascular: Negative.   Gastrointestinal: Negative.   Genitourinary: Negative.   Musculoskeletal: Negative.   Skin: Negative.   Neurological: Positive for headaches. Negative for seizures.  Endo/Heme/Allergies: Negative.   Psychiatric/Behavioral: Positive for depression.  All other systems reviewed and are negative.   Blood pressure 129/82, pulse 98, temperature 98.2 F (36.8 C), temperature source Oral, resp. rate 18, height 5' 4"  (1.626 m), weight 207 lb (93.895 kg).Body mass index is 35.51 kg/(m^2).  General Appearance: Well Groomed  Eye Contact:  Good  Speech:  Normal Rate  Volume:  Normal  Mood:  presents vaguely depressed, at this time not presenting manic or hypomanic   Affect:  Appropriate, slightly constricted   Thought Process:  Linear- no flight of ideations   Orientation:  Other:  fully alert and attentive   Thought Content:  denies hallucinations, no delusions, not internally preoccupied   Suicidal Thoughts:  No denies any suicidal or self injurious ideations,  contracts for safety on unit   Homicidal Thoughts:  No denies any violent or homicidal ideations   Memory:  recent and remote grossly intact   Judgement:  Other:  improved  Insight:  Present  Psychomotor Activity:  Normal  Concentration:  Concentration: Good and Attention Span: Good  Recall:  Good  Fund of Knowledge:  Good  Language:  Good  Akathisia:  Negative  Handed:  Right  AIMS (if indicated):     Assets:  Desire for Improvement Resilience  ADL's:  Intact  Cognition:  WNL  Sleep:  Number of Hours: 6       Treatment Plan Summary: Daily contact with patient to assess and evaluate symptoms and progress in treatment, Medication management, Plan inpatient admission  and medications as below   Observation Level/Precautions:  15 minute checks  Laboratory: will order routine HgbA1C, Lipid Panel, and also TSH  Psychotherapy:  Milieu, support  Medications:  We dicussed options, at this time she prefers to stay on Latuda, Wellburin XL combination, at current doses,  as she feels that it has been helpful and well tolerated. She is reluctant to titrate Latuda dose due to history of " twitching " at higher doses.    Consultations:  As needed   Discharge Concerns:  -   Estimated LOS: 4-5 days   Other:     I certify that inpatient services furnished can reasonably be expected to improve the patient's condition.    Neita Garnet, MD 5/30/20172:05 PM

## 2015-07-26 NOTE — [facility] Group Notes (Signed)
[facility] LCSW Group Therapy 07/26/2015 1:15 PM  Type of Therapy: Group Therapy- Feelings about Diagnosis  Pt did not attend, declined invitation.   Jeriyah Granlund Carter, LCSWA 07/26/2015 3:33 PM  

## 2015-07-26 NOTE — Progress Notes (Signed)
D: Pt was in bed in her room upon initial approach.  Pt has anxious, depressed, preoccupied affect and mood.  She is tearful at times.  Pt reports her goal is "to get better."  Pt avoids eye contact and walks with her head down.  She looked up at the ceiling while speaking to Probation officer.  Pt appears to be responding to internal stimuli.  Pt denies SI/HI, denies pain.  When asked if she is having hallucinations, pt states "I'm not certain."  Pt has stayed in her room for the majority of the night.  She did not attend evening group. A: Introduced self to pt.  Met with pt and offered support and encouragement.  Actively listened to pt.  On-site provider notified that pt is anxious and reports difficulty sleeping.  New orders obtained.  Medications administered per order.  PRN medication administered for anxiety. R: Pt is compliant with medications.  Pt verbally contracts for safety.  Will continue to monitor and assess.

## 2015-07-26 NOTE — Progress Notes (Signed)
Pt attended spiritual care group on grief and loss facilitated by chaplain Tally Mattox   Group opened with brief discussion and psycho-social ed around grief and loss in relationships and in relation to self - identifying life patterns, circumstances, changes that cause losses. Established group norm of speaking from own life experience. Group goal of establishing open and affirming space for members to share loss and experience with grief, normalize grief experience and provide psycho social education and grief support.     

## 2015-07-26 NOTE — Tx Team (Signed)
Interdisciplinary Treatment Plan Update (Adult) Date: 07/26/2015   Date: 07/26/2015 11:43 AM  Progress in Treatment:  Attending groups: Pt is new to milieu, continuing to assess   Participating in groups: Pt is new to milieu, continuing to assess  Taking medication as prescribed: Yes  Tolerating medication: Yes  Family/Significant other contact made: No, CSW assessing for appropriate contact  Patient understands diagnosis: Yes AEB seeking help with depression and hallucinations Discussing patient identified problems/goals with staff: Yes  Medical problems stabilized or resolved: Yes  Denies suicidal/homicidal ideation: Yes Patient has not harmed self or Others: Yes   New problem(s) identified: None identified at this time.   Discharge Plan or Barriers: Pt will return home and follow-up with outpatient providers   Additional comments:  Patient and CSW reviewed pt's identified goals and treatment plan. Patient verbalized understanding and agreed to treatment plan. CSW reviewed [facility] "Discharge Process and Patient Involvement" Form. Pt verbalized understanding of information provided and signed form.   Reason for Continuation of Hospitalization:  Anxiety  Depression Medication stabilization Auditory Hallucinations  Estimated length of stay: 3-5 days  Review of initial/current patient goals per problem list:   1.  Goal(s): Patient will participate in aftercare plan  Met:  No  Target date: 3-5 days from date of admission   As evidenced by: Patient will participate within aftercare plan AEB aftercare provider and housing plan at discharge being identified.   07/26/15: Pt will return home and follow-up with outpatient providers   2.  Goal (s): Patient will exhibit decreased depressive symptoms and suicidal ideations.  Met:  No  Target date: 3-5 days from date of admission   As evidenced by: Patient will utilize self rating of depression at 3 or below and demonstrate decreased  signs of depression or be deemed stable for discharge by MD. 07/26/15: Pt was admitted with symptoms of depression, rating 10/10. Pt continues to present with flat affect and depressive symptoms.  Pt will demonstrate decreased symptoms of depression and rate depression at 3/10 or lower prior to discharge.  3.  Goal(s): Patient will demonstrate decreased signs and symptoms of anxiety.  Met:  No  Target date: 3-5 days from date of admission   As evidenced by: Patient will utilize self rating of anxiety at 3 or below and demonstrated decreased signs of anxiety, or be deemed stable for discharge by MD 07/26/15: Pt was admitted with increased levels of anxiety and is currently rating those symptoms highly. Pt will demonstrated decreased symptoms of anxiety and rate it at 3/10 prior to d/c.  5.  Goal(s): Patient will demonstrate decreased signs of psychosis  . Met:  No . Target date: 3-5 days from date of admission  . As evidenced by: Patient will demonstrate decreased frequency of AVH or return to baseline function   -07/26/15: Pt recently admitted with auditory   hallucinations.  Attendees:  Patient:    Family:    Physician: Dr. Parke Poisson, MD  07/26/2015 11:43 AM  Nursing: Lars Pinks, RN Case manager  07/26/2015 11:43 AM  Clinical Social Worker Peri Maris, [place] 07/26/2015 11:43 AM  Other: Tilden Fossa, LCSWA 07/26/2015 11:43 AM  Clinical:  Idell Pickles, RN; Darrol Angel, RN 07/26/2015 11:43 AM  Other: , RN Charge Nurse 07/26/2015 11:43 AM  Other: Hilda Lias, [place], [place] Social Work [phone]

## 2015-07-27 LAB — URINE MICROSCOPIC-ADD ON

## 2015-07-27 LAB — URINALYSIS, ROUTINE W REFLEX MICROSCOPIC
Bilirubin Urine: NEGATIVE
GLUCOSE, UA: NEGATIVE mg/dL
HGB URINE DIPSTICK: NEGATIVE
Ketones, ur: NEGATIVE mg/dL
Nitrite: NEGATIVE
PH: 6 (ref 5.0–8.0)
PROTEIN: NEGATIVE mg/dL
SPECIFIC GRAVITY, URINE: 1.036 — AB (ref 1.005–1.030)

## 2015-07-27 LAB — LIPID PANEL
CHOL/HDL RATIO: 3.2 ratio
Cholesterol: 161 mg/dL (ref 0–200)
HDL: 50 mg/dL (ref >40)
LDL CALC: 95 mg/dL (ref 0–99)
Triglycerides: 79 mg/dL (ref <150)
VLDL: 16 mg/dL (ref 0–40)

## 2015-07-27 LAB — TSH: TSH: 1.192 u[IU]/mL (ref 0.350–4.500)

## 2015-07-27 MED ORDER — LURASIDONE HCL 20 MG PO TABS
20.0000 mg | ORAL_TABLET | Freq: Every day | ORAL | Status: DC
  Administered 2015-07-27: 20 mg via ORAL
  Filled 2015-07-27 (×3): qty 1

## 2015-07-27 NOTE — Progress Notes (Signed)
D:  Patient's self inventory sheet, patient sleeps good, no sleep medication given.  Fair appetite, low energy level, good concentration.  Rated depression 5, hopeless 2, anxiety 3.  Denied withdrawals.  Denied SI.  Physical problems, lightheaded, dizziness, headaches.  No physical pain.  Goal is to learn how to relax and not be so stressed.  Plans to hopefully use the tools learned in groups to meet her goals.  No discharge plans. A:  Medications administered per MD orders.  Emotional support and encouragement given patient. R:  Denied SI and HI, contracts for safety.  Denied A/V hallucinations.  Safety maintained with 15 minute checks.

## 2015-07-27 NOTE — Progress Notes (Signed)
Adult Psychoeducational Group Note  Date:  07/27/2015 Time:  10:02 PM  Group Topic/Focus:  Wrap-Up Group:   The focus of this group is to help patients review their daily goal of treatment and discuss progress on daily workbooks.  Participation Level:  Active  Participation Quality:  Appropriate  Affect:  Appropriate  Cognitive:  Alert  Insight: Appropriate  Engagement in Group:  Engaged  Modes of Intervention:  Discussion  Additional Comments: Patient states, "my day started off horrible". Patient states something positive that happened today was her mom and uncle coming to visit. Patient goal for today was to try to de-stress a little more.   Bibi Economos L Brody Kump 07/27/2015, 10:02 PM

## 2015-07-27 NOTE — [facility] Group Notes (Signed)
[facility]  07/27/2015 8:45 AM  Participation Quality: Alert, Appropriate and Oriented  Mood/Affect: Appropriate  Depression Rating: 2.5  Anxiety Rating: 3  Thoughts of Suicide: Pt denies SI/HI  Will you contract for safety? Yes  Current AVH: Pt denies  Plan for Discharge/Comments: Pt attended discharge planning group and actively participated in group. CSW discussed suicide prevention education with the group and encouraged them to discuss discharge planning and any relevant barriers. Pt reports that she is feeling much better today and would like to engage in treatment.  Transportation Means: Pt reports access to transportation  Supports: No supports mentioned at this time  Chad CordialLauren Carter, LCSWA 07/27/2015 10:59 AM

## 2015-07-27 NOTE — Progress Notes (Signed)
Recreation Therapy Notes  Date: 05.31.2017 Time: 9:30am Location: [address] Group Room   Group Topic: Stress Management  Goal Area(s) Addresses:  Patient will actively participate in stress management techniques presented during session.   Behavioral Response: Did not attend.   Chanee Henrickson L Juri Dinning, LRT/CTRS        Quantia Grullon L 07/27/2015 2:09 PM 

## 2015-07-27 NOTE — [facility] Group Notes (Signed)
[facility] LCSW Group Therapy 07/27/2015 1:15 PM  Type of Therapy: Group Therapy- Emotion Regulation  Participation Level: Minimal  Participation Quality: Reserved  Affect: Appropriate  Cognitive: Alert and Oriented   Insight: Unable to Assess  Engagement in Therapy: Minimal  Modes of Intervention: Clarification, Confrontation, Discussion, Education, Exploration, Limit-setting, Orientation, Problem-solving, Rapport Building, Dance movement psychotherapisteality Testing, Socialization and Support  Summary of Progress/Problems: The topic for group today was emotional regulation. This group focused on both positive and negative emotion identification and allowed group members to process ways to identify feelings, regulate negative emotions, and find healthy ways to manage internal/external emotions. Group members were asked to reflect on a time when their reaction to an emotion led to a negative outcome and explored how alternative responses using emotion regulation would have benefited them. Group members were also asked to discuss a time when emotion regulation was utilized when a negative emotion was experienced. Pt did not participate verbally in group discussion, however was attentive throughout.   Jessica CordialLauren Carter, LCSWA 07/27/2015 4:01 PM

## 2015-07-27 NOTE — Progress Notes (Signed)
Patient stated Dr. [place] SinkBraden told her to take latuda after her dinner meal, usually takes at 1830.  Patient did not take latuda this morning but will discuss with MD this morning.

## 2015-07-27 NOTE — Progress Notes (Signed)
Pt was observed in the dayroom at change of shift watching TV and talking minimally with her peers.  She says she still has passive suicidal thoughts that come and go.  She denies HI/AVH.  She appears to be more engaged and brighter this evening that she was last evening.  She feels her meds are helping with her mood.  She says her evening is going well.  She voices no needs or concerns at this time.  Support and encouragement offered.  Pt plans to return home at discharge.  Discharge plans are in process.  Safety maintained with q15 minute checks.

## 2015-07-27 NOTE — Progress Notes (Addendum)
[facility] MD Progress Note  07/27/2015 6:20 PM Jessica [name]  MRN:  [id number] Subjective:   Patient reports feeling significantly better today. States " I am feeling better, calmer" and today denies severe depression or any major neuro-vegetative symptoms of depression. States she had good visit by mother yesterday evening . Denies medication side effects. Objective : I have discussed case with treatment team and have met with patient . Patient presenting with improvement compared to admission - improved mood and improved range of affect noted. She denies medication side effects. She does request to change Latuda to QHS dosing , as she feels it causes some sedation. Does not want to increase to doses above current, as she states that higher doses associated with emerging " tics and twitches". She denies any current abnormal/involuntary movements and none are apparent during session . No disruptive or agitated behaviors on unit. She has been going to groups, limited participation. Labs- Lipid profile WNL, TSH WNL   Principal Problem: Bipolar I disorder, most recent episode depressed ([place]) Diagnosis:   Patient Active Problem List   Diagnosis Date Noted  . Bipolar I disorder, most recent episode depressed ([place]) [F31.30]   . Schizoaffective disorder, bipolar type ([place]) [F25.0] 07/25/2015   Total Time spent with patient: 20 minutes    Past Medical History:  Past Medical History  Diagnosis Date  . Bipolar 1 disorder ([place])    History reviewed. No pertinent past surgical history. Family History:  Family History  Problem Relation Age of Onset  . Hypertension Mother    Social History:  History  Alcohol Use No     History  Drug Use No    Social History   Social History  . Marital Status: Single    Spouse Name: N/A  . Number of Children: N/A  . Years of Education: N/A   Social History Main Topics  . Smoking status: Never Smoker   . Smokeless tobacco: None  . Alcohol Use: No  .  Drug Use: No  . Sexual Activity: Not Asked   Other Topics Concern  . None   Social History Narrative   Additional Social History:   Sleep: Good  Appetite:  Good  Current Medications: Current Facility-Administered Medications  Medication Dose Route Frequency Provider Last Rate Last Dose  . acetaminophen (TYLENOL) tablet 650 mg  650 mg Oral Q6H PRN Patrecia Pour, NP      . alum & mag hydroxide-simeth (MAALOX/MYLANTA) 200-200-20 MG/5ML suspension 30 mL  30 mL Oral Q4H PRN Patrecia Pour, NP      . buPROPion (WELLBUTRIN XL) 24 hr tablet 150 mg  150 mg Oral Daily Patrecia Pour, NP   150 mg at 07/27/15 0813  . cholecalciferol (VITAMIN D) tablet 1,000 Units  1,000 Units Oral Daily Patrecia Pour, NP   1,000 Units at 07/27/15 9702  . hydrOXYzine (ATARAX/VISTARIL) tablet 25 mg  25 mg Oral Q6H PRN Laverle Hobby, PA-C   25 mg at 07/25/15 2208  . lurasidone (LATUDA) tablet 20 mg  20 mg Oral QHS Fernando A Cobos, MD      . magnesium hydroxide (MILK OF MAGNESIA) suspension 30 mL  30 mL Oral Daily PRN Patrecia Pour, NP      . traZODone (DESYREL) tablet 50 mg  50 mg Oral QHS PRN Jenne Campus, MD        Lab Results:  Results for orders placed or performed during the hospital encounter of 07/25/15 (from the past  48 hour(s))  Urinalysis, Routine w reflex microscopic (not at [facility])     Status: Abnormal   Collection Time: 07/26/15  9:30 PM  Result Value Ref Range   Color, Urine YELLOW YELLOW   APPearance TURBID (A) CLEAR   Specific Gravity, Urine 1.036 (H) 1.005 - 1.030   pH 6.0 5.0 - 8.0   Glucose, UA NEGATIVE NEGATIVE mg/dL   Hgb urine dipstick NEGATIVE NEGATIVE   Bilirubin Urine NEGATIVE NEGATIVE   Ketones, ur NEGATIVE NEGATIVE mg/dL   Protein, ur NEGATIVE NEGATIVE mg/dL   Nitrite NEGATIVE NEGATIVE   Leukocytes, UA MODERATE (A) NEGATIVE    Comment: Performed at [facility]  Urine microscopic-add on     Status: Abnormal   Collection Time: 07/26/15  9:30 PM   Result Value Ref Range   Squamous Epithelial / LPF 0-5 (A) NONE SEEN   WBC, UA 0-5 0 - 5 WBC/hpf   RBC / HPF 0-5 0 - 5 RBC/hpf   Bacteria, UA RARE (A) NONE SEEN   Crystals CA OXALATE CRYSTALS (A) NEGATIVE   Urine-Other AMORPHOUS URATES/PHOSPHATES     Comment: Performed at [facility]  Lipid panel     Status: None   Collection Time: 07/27/15  6:15 AM  Result Value Ref Range   Cholesterol 161 0 - 200 mg/dL   Triglycerides 79 <150 mg/dL   HDL 50 >40 mg/dL   Total CHOL/HDL Ratio 3.2 RATIO   VLDL 16 0 - 40 mg/dL   LDL Cholesterol 95 0 - 99 mg/dL    Comment:        Total Cholesterol/HDL:CHD Risk Coronary Heart Disease Risk Table                     Men   Women  1/2 Average Risk   3.4   3.3  Average Risk       5.0   4.4  2 X Average Risk   9.6   7.1  3 X Average Risk  23.4   11.0        Use the calculated Patient Ratio above and the CHD Risk Table to determine the patient's CHD Risk.        ATP III CLASSIFICATION (LDL):  <100     mg/dL   Optimal  100-129  mg/dL   Near or Above                    Optimal  130-159  mg/dL   Borderline  160-189  mg/dL   High  >190     mg/dL   Very High Performed at [facility]   TSH     Status: None   Collection Time: 07/27/15  6:15 AM  Result Value Ref Range   TSH 1.192 0.350 - 4.500 uIU/mL    Comment: Performed at [facility]    Blood Alcohol level:  Lab Results  Component Value Date   [facility] <5 07/25/2015   ETH <11 08/11/2010    Physical Findings: AIMS: Facial and Oral Movements Muscles of Facial Expression: None, normal Lips and Perioral Area: None, normal Jaw: None, normal Tongue: None, normal,Extremity Movements Upper (arms, wrists, hands, fingers): None, normal Lower (legs, knees, ankles, toes): None, normal, Trunk Movements Neck, shoulders, hips: None, normal, Overall Severity Severity of abnormal movements (highest score from questions above): None, normal Incapacitation due to  abnormal movements: None, normal Patient's awareness of abnormal movements (rate only patient's report):  No Awareness, Dental Status Current problems with teeth and/or dentures?: No Does patient usually wear dentures?: No  CIWA:    COWS:     Musculoskeletal: Strength & Muscle Tone: within normal limits Gait & Station: normal Patient leans: N/A  Psychiatric Specialty Exam: Physical Exam  ROS denies chest pain, no shortness of breath, no vomiting, no rash , at this time denies dysuria, denies frequency, no fever or chills  Blood pressure 121/80, pulse 83, temperature 98.5 F (36.9 C), temperature source Oral, resp. rate 18, height 5' 4"  (1.626 m), weight 207 lb (93.895 kg).Body mass index is 35.51 kg/(m^2).  General Appearance: Well Groomed  Eye Contact:  Good  Speech:  Normal Rate  Volume:  Normal  Mood:  improved mood, improved range of affect   Affect:  more reactive   Thought Process:  Linear  Orientation:  Full (Time, Place, and Person)  Thought Content:  denies hallucinations, no delusions   Suicidal Thoughts:  No denies any suicidal ideations, denies any self injurious ideations   Homicidal Thoughts:  No  Memory:  recent and remote grossly intact   Judgement:  Other:  improved   Insight:  improved   Psychomotor Activity:  Normal  Concentration:  Concentration: Good and Attention Span: Good  Recall:  Good  Fund of Knowledge:  Good  Language:  Good  Akathisia:  Negative  Handed:  Right  AIMS (if indicated):   no akathisia, no abnormal involuntary movements   Assets:  Desire for Improvement Resilience  ADL's:  Intact  Cognition:  WNL  Sleep:  Number of Hours: 5.75  Assessment - patient reports feeling better , less depressed, and presents with improved mood and fuller range of affect. Tolerating medications well , states she prefers to change Latuda to night time .   Treatment Plan Summary: Daily contact with patient to assess and evaluate symptoms and progress in  treatment, Medication management, Plan inpatient admission  and medications as below  Continue to encourage groups, milieu to work on coping skills and symptom reduction Continue Trazodone 50 mgrs QHS PRN for insomnia  Continue Wellbutrin XL 150 mgrs QAM for depression Change Latuda to 20 mgrs QHS for depression, mood disorder  Continue Vistaril 25 mgrs Q 6 hours PRN for anxiety Treatment team working on discharge [place], MD 07/27/2015, 6:20 PM

## 2015-07-28 LAB — URINE CULTURE: SPECIAL REQUESTS: NORMAL

## 2015-07-28 LAB — HEMOGLOBIN A1C
Hgb A1c MFr Bld: 5.7 % — ABNORMAL HIGH (ref 4.8–5.6)
Mean Plasma Glucose: 117 mg/dL

## 2015-07-28 LAB — PROLACTIN: PROLACTIN: 20.7 ng/mL (ref 4.8–23.3)

## 2015-07-28 MED ORDER — LURASIDONE HCL 40 MG PO TABS
20.0000 mg | ORAL_TABLET | Freq: Every day | ORAL | Status: DC
  Administered 2015-07-28: 20 mg via ORAL
  Filled 2015-07-28 (×3): qty 1

## 2015-07-28 NOTE — Progress Notes (Signed)
D: Pt presents with appropriate affect and pleasant mood.  She smiles occasionally and reports feeling "much better" than she did at admission.  Pt's body language has improved since admission.  She is no longer looking down at the floor, not tearful like she was on her first night.  She reports her goal is to "de-stress" and that she has been able to do this.  Pt reports "the group therapy seems to help with the tools they're giving us."  Pt reports she is making "baby steps in the right direction."  Pt denies SI/HI, denies hallucinations, denies pain.  She interacts with peers cautiously.    A: Introduced self to pt.  Actively listened to pt and offered support and encouragement.  Medication administered per order.  Medication education provided.  Pt continues to reports she usually takes [place]uda after dinner.  She reports she ate a snack prior to taking HS medication.  R: Pt is compliant with medication.  She verbally contracts for safety.  Will continue to monitor and assess.

## 2015-07-28 NOTE — [facility] Group Notes (Signed)
[facility] [employer] Group Therapy 07/28/2015 1:15pm  Type of Therapy: [employer] Presentation  Participation Level: Active  Participation Quality: Attentive  Affect: Appropriate  Cognitive: Oriented  Insight: Developing/Improving  Engagement in Therapy: Engaged  Modes of Intervention: Discussion, Education and Socialization  Summary of Progress/Problems: [employer] ([facility]) Speaker came to talk about his personal journey with substance abuse and addiction. The pt processed ways by which to relate to the speaker. MHA speaker provided handouts and educational information pertaining to groups and services offered by the MHA. Pt was engaged in speaker's presentation and was receptive to resources provided.    Kaidynce Pfister Carter, LCSWA 07/28/2015 2:08 PM  

## 2015-07-28 NOTE — Progress Notes (Signed)
[facility]  07/28/2015 1:37 PM Jessica ChaletJennifer L [name]  MRN:  [id number] Subjective:  "I feel pretty good. I'm definitely much better since I got here."  Objective: Pt seen and chart reviewed. Pt is alert/oriented x4, calm, cooperative, and appropriate to situation. Pt denies suicidal/homicidal ideation and psychosis and does not appear to be responding to internal stimuli. Pt reports that her sleep improved overall but was rough last night due to her roommate having the light on the entire evening. Pt reports that she feels her medications are working well and that she would like to continue as ordered at present.   Principal Problem: Bipolar I disorder, most recent episode depressed (HCC) Diagnosis:   Patient Active Problem List   Diagnosis Date Noted  . Bipolar I disorder, most recent episode depressed (HCC) [F31.30]     Priority: High  . Schizoaffective disorder, bipolar type (HCC) [F25.0] 07/25/2015    Priority: High   Total Time spent with patient: 15 minutes   Past Medical History:  Past Medical History  Diagnosis Date  . Bipolar 1 disorder (HCC)    History reviewed. No pertinent past surgical history. Family History:  Family History  Problem Relation Age of Onset  . Hypertension Mother    Social History:  History  Alcohol Use No     History  Drug Use No    Social History   Social History  . Marital Status: Single    Spouse Name: N/A  . Number of Children: N/A  . Years of Education: N/A   Social History Main Topics  . Smoking status: Never Smoker   . Smokeless tobacco: None  . Alcohol Use: No  . Drug Use: No  . Sexual Activity: Not Asked   Other Topics Concern  . None   Social History Narrative   Additional Social History:   Sleep: Good  Appetite:  Good  Current Medications: Current Facility-Administered Medications  Medication Dose Route Frequency Provider Last Rate Last Dose  . acetaminophen (TYLENOL) tablet 650 mg  650 mg Oral Q6H PRN  Charm RingsJamison Y Lord, NP      . alum & mag hydroxide-simeth (MAALOX/MYLANTA) 200-200-20 MG/5ML suspension 30 mL  30 mL Oral Q4H PRN Charm RingsJamison Y Lord, NP      . buPROPion (WELLBUTRIN XL) 24 hr tablet 150 mg  150 mg Oral Daily Charm RingsJamison Y Lord, NP   150 mg at 07/28/15 0825  . cholecalciferol (VITAMIN D) tablet 1,000 Units  1,000 Units Oral Daily Charm RingsJamison Y Lord, NP   1,000 Units at 07/28/15 0825  . hydrOXYzine (ATARAX/VISTARIL) tablet 25 mg  25 mg Oral Q6H PRN Kerry HoughSpencer E Simon, PA-C   25 mg at 07/25/15 2208  . lurasidone (LATUDA) tablet 20 mg  20 mg Oral QPC supper Craige CottaFernando A Cobos, MD      . magnesium hydroxide (MILK OF MAGNESIA) suspension 30 mL  30 mL Oral Daily PRN Charm RingsJamison Y Lord, NP      . traZODone (DESYREL) tablet 50 mg  50 mg Oral QHS PRN Craige CottaFernando A Cobos, MD        Lab Results:  Results for orders placed or performed during the hospital encounter of 07/25/15 (from the past 48 hour(s))  Urinalysis, Routine w reflex microscopic (not at [facility])     Status: Abnormal   Collection Time: 07/26/15  9:30 PM  Result Value Ref Range   Color, Urine YELLOW YELLOW   APPearance TURBID (A) CLEAR   Specific Gravity, Urine 1.036 (H) 1.005 -  1.030   pH 6.0 5.0 - 8.0   Glucose, UA NEGATIVE NEGATIVE mg/dL   Hgb urine dipstick NEGATIVE NEGATIVE   Bilirubin Urine NEGATIVE NEGATIVE   Ketones, ur NEGATIVE NEGATIVE mg/dL   Protein, ur NEGATIVE NEGATIVE mg/dL   Nitrite NEGATIVE NEGATIVE   Leukocytes, UA MODERATE (A) NEGATIVE    Comment: Performed at [facility]  Urine culture     Status: Abnormal   Collection Time: 07/26/15  9:30 PM  Result Value Ref Range   Specimen Description      URINE, RANDOM Performed at [facility]    Special Requests      Normal Performed at [facility]    Culture MULTIPLE SPECIES PRESENT, SUGGEST RECOLLECTION (A)    Report Status 07/28/2015 FINAL   Urine microscopic-add on     Status: Abnormal   Collection Time: 07/26/15  9:30  PM  Result Value Ref Range   Squamous Epithelial / LPF 0-5 (A) NONE SEEN   WBC, UA 0-5 0 - 5 WBC/hpf   RBC / HPF 0-5 0 - 5 RBC/hpf   Bacteria, UA RARE (A) NONE SEEN   Crystals CA OXALATE CRYSTALS (A) NEGATIVE   Urine-Other AMORPHOUS URATES/PHOSPHATES     Comment: Performed at [facility]  Hemoglobin A1c     Status: Abnormal   Collection Time: 07/27/15  6:15 AM  Result Value Ref Range   Hgb A1c MFr Bld 5.7 (H) 4.8 - 5.6 %    Comment: (NOTE)         Pre-diabetes: 5.7 - 6.4         Diabetes: >6.4         Glycemic control for adults with diabetes: <7.0    Mean Plasma Glucose 117 mg/dL    Comment: (NOTE) Performed At: [facility] [address] [place], [place] [id number] Mila Homer MD [id number] Performed at [facility]   Lipid panel     Status: None   Collection Time: 07/27/15  6:15 AM  Result Value Ref Range   Cholesterol 161 0 - 200 mg/dL   Triglycerides 79 <951 mg/dL   HDL 50 >88 mg/dL   Total CHOL/HDL Ratio 3.2 RATIO   VLDL 16 0 - 40 mg/dL   LDL Cholesterol 95 0 - 99 mg/dL    Comment:        Total Cholesterol/HDL:CHD Risk Coronary Heart Disease Risk Table                     Men   Women  1/2 Average Risk   3.4   3.3  Average Risk       5.0   4.4  2 X Average Risk   9.6   7.1  3 X Average Risk  23.4   11.0        Use the calculated Patient Ratio above and the CHD Risk Table to determine the patient's CHD Risk.        ATP III CLASSIFICATION (LDL):  <100     mg/dL   Optimal  416-606  mg/dL   Near or Above                    Optimal  130-159  mg/dL   Borderline  301-601  mg/dL   High  >093     mg/dL   Very High Performed at [facility]   Prolactin     Status:  None   Collection Time: 07/27/15  6:15 AM  Result Value Ref Range   Prolactin 20.7 4.8 - 23.3 ng/mL    Comment: (NOTE) Performed At: [facility] [address] [place], [place] [id number] Mila Homer MD  [id number] Performed at [facility]   TSH     Status: None   Collection Time: 07/27/15  6:15 AM  Result Value Ref Range   TSH 1.192 0.350 - 4.500 uIU/mL    Comment: Performed at [facility]    Blood Alcohol level:  Lab Results  Component Value Date   [facility] <5 07/25/2015   ETH <11 08/11/2010    Physical Findings: AIMS: Facial and Oral Movements Muscles of Facial Expression: None, normal Lips and Perioral Area: None, normal Jaw: None, normal Tongue: None, normal,Extremity Movements Upper (arms, wrists, hands, fingers): None, normal Lower (legs, knees, ankles, toes): None, normal, Trunk Movements Neck, shoulders, hips: None, normal, Overall Severity Severity of abnormal movements (highest score from questions above): None, normal Incapacitation due to abnormal movements: None, normal Patient's awareness of abnormal movements (rate only patient's report): No Awareness, Dental Status Current problems with teeth and/or dentures?: No Does patient usually wear dentures?: No  CIWA:  CIWA-Ar Total: 1 COWS:  COWS Total Score: 2  Musculoskeletal: Strength & Muscle Tone: within normal limits Gait & Station: normal Patient leans: N/A  Psychiatric Specialty Exam: Physical Exam  Review of Systems  Psychiatric/Behavioral: Positive for depression. Negative for suicidal ideas and hallucinations. The patient is nervous/anxious. The patient does not have insomnia.   All other systems reviewed and are negative.  denies chest pain, no shortness of breath, no vomiting, no rash , at this time denies dysuria, denies frequency, no fever or chills  Blood pressure 114/71, pulse 84, temperature 98.7 F (37.1 C), temperature source Oral, resp. rate 18, height  (1.626 m), weight 93.895 kg (207 lb).Body mass index is 35.51 kg/(m^2).  General Appearance: Casual and Well Groomed  Eye Contact:  Good  Speech:  Clear and Coherent and Normal Rate  Volume:   Normal  Mood:  Euthymic  Affect:  Appropriate, Congruent and more reactive   Thought Process:  Linear  Orientation:  Full (Time, Place, and Person)  Thought Content:  Symptoms worries concerns  Suicidal Thoughts:  No denies any suicidal ideations, denies any self injurious ideations ; able to contract for safety  Homicidal Thoughts:  No  Memory:  recent and remote grossly intact   Judgement:  Fair yet improving  Insight:  Good  Psychomotor Activity:  Normal  Concentration:  Concentration: Good and Attention Span: Good  Recall:  Good  Fund of Knowledge:  Good  Language:  Good  Akathisia:  Negative  Handed:  Right  AIMS (if indicated):   no akathisia, no abnormal involuntary movements   Assets:  Desire for Improvement Resilience  ADL's:  Intact  Cognition:  WNL  Sleep:  Number of Hours: 6.25   Treatment Plan Summary: Bipolar I disorder, most recent episode depressed (HCC), improving, treated as below on 07/28/2015   Daily contact with patient to assess and evaluate symptoms and progress in treatment, Medication management, Plan inpatient admission  and medications as below  Continue to encourage groups, milieu to work on coping skills and symptom reduction Continue Trazodone 50 mgrs QHS PRN for insomnia  Continue Wellbutrin XL 150 mgrs QAM for depression Change Latuda to 20 mgrs QHS for depression, mood disorder  Continue Vistaril 25 mgrs Q 6 hours  PRN for anxiety Treatment team working on discharge planning  On .07/28/15 I have reviewed treatment plan; no changes at this time as it is working well  Beau Fanny, FNP 07/28/2015, 1:37 PM Agree with NP Progress Note as above

## 2015-07-28 NOTE — Plan of Care (Signed)
Problem: Health Behavior/Discharge Planning: Goal: Compliance with prescribed medication regimen will improve Outcome: Progressing Pt was compliant with prescribed medication during this shift.

## 2015-07-28 NOTE — Progress Notes (Signed)
Patient ID: Jessica [name], female   DOB: 12/15/1975, 40 y.o.   MRN: [id number] D: Client smiling reports of her day "good, went outside" "groups good, good tools, refreshers" A: Writer provided emotional support, medication reviewed, administered as ordered. Staff will monitor q1415min for safety. R:Client is safe on the unit, attended karaoke.

## 2015-07-28 NOTE — Progress Notes (Signed)
D: Pt bright on approach. Pt reported decreased depression and anxiety. Pt reported that she feels stabilized this morning. Pt reported that prior to coming in she was unable to sleep for several days and her mother was able to recognize her increase in depression. Pt denies suicidal thoughts. Pt compliant with taking meds and attending groups. Pt requested to have Latuda changed to after supper to continue her home regimen. Dr. Jama Flavorsobos made aware and order modified by writer. A: Medications administered as ordered per MD. Verbal support provided. Pt encouraged to attend groups. 15 minute checks performed for safety.  R: Pt receptive to tx.

## 2015-07-29 DIAGNOSIS — F250 Schizoaffective disorder, bipolar type: Principal | ICD-10-CM

## 2015-07-29 MED ORDER — HYDROXYZINE HCL 25 MG PO TABS: 25.0000 mg | ORAL_TABLET | Freq: Four times a day (QID) | ORAL | Status: DC | PRN

## 2015-07-29 MED ORDER — TRAZODONE HCL 50 MG PO TABS: 50.0000 mg | ORAL_TABLET | Freq: Every evening | ORAL | Status: DC | PRN

## 2015-07-29 MED ORDER — LURASIDONE HCL 20 MG PO TABS: 20.0000 mg | ORAL_TABLET | Freq: Every day | ORAL | Status: DC

## 2015-07-29 MED ORDER — VITAMIN D3 25 MCG (1000 UNIT) PO TABS: 1000.0000 [IU] | ORAL_TABLET | Freq: Every day | ORAL | Status: DC

## 2015-07-29 MED ORDER — BUPROPION HCL ER (XL) 150 MG PO TB24: 150.0000 mg | ORAL_TABLET | Freq: Every day | ORAL | Status: DC

## 2015-07-29 NOTE — [facility] Suicide Risk Assessment (Signed)
[facility] INPATIENT:  Family/Significant Other Suicide Prevention Education  Suicide Prevention Education:  Education Completed; Marikay AlarLinda Lotspeich, Pt's mother [phone],  has been identified by the patient as the family member/significant other with whom the patient will be residing, and identified as the person(s) who will aid the patient in the event of a mental health crisis (suicidal ideations/suicide attempt).  With written consent from the patient, the family member/significant other has been provided the following suicide prevention education, prior to the and/or following the discharge of the patient.  The suicide prevention education provided includes the following:  Suicide risk factors  Suicide prevention and interventions  National Suicide Hotline telephone number  [facility] assessment telephone number  [facility] Emergency Assistance 911  [facility] and/or Residential Mobile Crisis Unit telephone number  Request made of family/significant other to:  Remove weapons (e.g., guns, rifles, knives), all items previously/currently identified as safety concern.    Remove drugs/medications (over-the-counter, prescriptions, illicit drugs), all items previously/currently identified as a safety concern.  The family member/significant other verbalizes understanding of the suicide prevention education information provided.  The family member/significant other agrees to remove the items of safety concern listed above.  Jessica [name], Jessica [name] 07/29/2015, 10:17 AM

## 2015-07-29 NOTE — Progress Notes (Signed)
Patient discharged per physician order; patient denies SI/HI and A/V hallucinations; patient received AVS, tranisition record, suicide risk assessment note, and prescriptions after it was reviewed; patient verbalized and signed that she received all belongings; patient left the unit ambulatory

## 2015-07-29 NOTE — Discharge Summary (Signed)
Physician Discharge Summary Note  Patient:  Jessica [name] is an 40 y.o., female MRN:  [id number] DOB:  1975-11-16 Patient phone:  [phone] (home)  Patient address:   [address] [place] [address],  Total Time spent with patient: 30 minutes  Date of Admission:  07/25/2015 Date of Discharge:  07/29/2015  Reason for Admission:  unable to sleep  Principal Problem: Schizoaffective disorder, bipolar type [facility]) Discharge Diagnoses: Patient Active Problem List   Diagnosis Date Noted  . Schizoaffective disorder, bipolar type (HCC) [F25.0] 07/25/2015    Past Psychiatric History:  See HPI  Past Medical History:  Past Medical History  Diagnosis Date  . Bipolar 1 disorder (HCC)    History reviewed. No pertinent past surgical history. Family History:  Family History  Problem Relation Age of Onset  . Hypertension Mother    Family Psychiatric  History:   See HPI Social History:  History  Alcohol Use No     History  Drug Use No    Social History   Social History  . Marital Status: Single    Spouse Name: N/A  . Number of Children: N/A  . Years of Education: N/A   Social History Main Topics  . Smoking status: Never Smoker   . Smokeless tobacco: None  . Alcohol Use: No  . Drug Use: No  . Sexual Activity: Not Asked   Other Topics Concern  . None   Social History Narrative    Hospital Course:  Jessica [name], a 40 year old Female reported having history of Bipolar Disorder.  She stated in the last few weeks, tshe felt overwhelmed, anxious, and states she had not been sleeping well over the last couple of weeks.  She stated no specific triggers.    Jessica [name] was admitted for Schizoaffective disorder, bipolar type (HCC) and crisis management.  She was treated with medications and their indications listed below.  Medical problems were identified and treated as needed.  Home medications were restarted as appropriate.  Improvement was monitored by  observation and Jessica [name] daily report of symptom reduction.  Emotional and mental status was monitored by daily self inventory reports completed by Jessica [name] and clinical staff.  Patient reported continued improvement, denied any new concerns.  Patient had been compliant on medications and denied side effects.  Support and encouragement was provided.    Patient did well during inpatient stay.  At time of discharge, patient rated both depression and anxiety levels to be manageable and minimal.  Patient encouraged to attend groups to help with recognizing triggers of emotional crises and de-stabilizations.  Patient encouraged to attend group to help identify the positive things in life that would help in dealing with feelings of loss, depression and unhealthy or abusive tendencies.         Jessica [name] was evaluated by the treatment team for stability and plans for continued recovery upon discharge.  She was offered further treatment options upon discharge including Residential, Intensive Outpatient and Outpatient treatment. She will follow up with agencies listed below for medication management and counseling.  Encouraged patient to maintain satisfactory support network and home environment.  Advised to adhere to medication compliance and outpatient treatment follow up.      Jessica [name] motivation was an integral factor for scheduling further treatment.  Employment, transportation, bed availability, health status, family support, and any pending legal issues were also considered during her hospital stay.  Upon completion of this admission  the patient was both mentally and medically stable for discharge denying suicidal/homicidal ideation, auditory/visual/tactile hallucinations, delusional thoughts and paranoia.      Physical Findings: AIMS: Facial and Oral Movements Muscles of Facial Expression: None, normal Lips and Perioral Area: None, normal Jaw: None, normal Tongue: None,  normal,Extremity Movements Upper (arms, wrists, hands, fingers): None, normal Lower (legs, knees, ankles, toes): None, normal, Trunk Movements Neck, shoulders, hips: None, normal, Overall Severity Severity of abnormal movements (highest score from questions above): None, normal Incapacitation due to abnormal movements: None, normal Patient's awareness of abnormal movements (rate only patient's report): No Awareness, Dental Status Current problems with teeth and/or dentures?: No Does patient usually wear dentures?: No  CIWA:  CIWA-Ar Total: 1 COWS:  COWS Total Score: 2  Musculoskeletal: Strength & Muscle Tone: within normal limits Gait & Station: normal Patient leans: N/A  Psychiatric Specialty Exam:  SEE MD SRA Physical Exam  Nursing note and vitals reviewed. Psychiatric: Her mood appears anxious. Thought content is not paranoid. She does not exhibit a depressed mood. She expresses no homicidal and no suicidal ideation.    Review of Systems  All other systems reviewed and are negative.   Blood pressure 134/82, pulse 90, temperature 98.7 F (37.1 C), temperature source Oral, resp. rate 16, height  (1.626 m), weight 93.895 kg (207 lb).Body mass index is 35.51 kg/(m^2).   Have you used any form of tobacco in the last 30 days? (Cigarettes, Smokeless Tobacco, Cigars, and/or Pipes): No  Has this patient used any form of tobacco in the last 30 days? (Cigarettes, Smokeless Tobacco, Cigars, and/or Pipes) Yes, NA  Blood Alcohol level:  Lab Results  Component Value Date   [facility] <5 07/25/2015   ETH <11 08/11/2010    Metabolic Disorder Labs:  Lab Results  Component Value Date   HGBA1C 5.7* 07/27/2015   MPG 117 07/27/2015   Lab Results  Component Value Date   PROLACTIN 20.7 07/27/2015   Lab Results  Component Value Date   CHOL 161 07/27/2015   TRIG 79 07/27/2015   HDL 50 07/27/2015   CHOLHDL 3.2 07/27/2015   VLDL 16 07/27/2015   LDLCALC 95 07/27/2015    See Psychiatric  Specialty Exam and Suicide Risk Assessment completed by Attending Physician prior to discharge.  Discharge destination:  Home  Is patient on multiple antipsychotic therapies at discharge:  No   Has Patient had three or more failed trials of antipsychotic monotherapy by history:  No  Recommended Plan for Multiple Antipsychotic Therapies: NA     Medication List    STOP taking these medications        ibuprofen 200 MG tablet  Commonly known as:  ADVIL,MOTRIN     ipratropium 0.03 % nasal spray  Commonly known as:  ATROVENT      TAKE these medications      Indication   buPROPion 150 MG 24 hr tablet  Commonly known as:  WELLBUTRIN XL  Take 1 tablet (150 mg total) by mouth daily.   Indication:  Major Depressive Disorder     cholecalciferol 1000 units tablet  Commonly known as:  VITAMIN D  Take 1 tablet (1,000 Units total) by mouth daily.   Indication:  Vitamin D Deficiency     hydrOXYzine 25 MG tablet  Commonly known as:  ATARAX/VISTARIL  Take 1 tablet (25 mg total) by mouth every 6 (six) hours as needed for anxiety.   Indication:  Anxiety Neurosis     lurasidone 20 MG Tabs  tablet  Commonly known as:  LATUDA  Take 1 tablet (20 mg total) by mouth daily after supper.   Indication:  Depressive Phase of Manic-Depression     traZODone 50 MG tablet  Commonly known as:  DESYREL  Take 1 tablet (50 mg total) by mouth at bedtime as needed for sleep.   Indication:  Trouble Sleeping           Follow-up Information    [facility] up with [facility] On 08/03/2015.   Why:  at 3:00pm with Dr. Alvarado SinkBraden for medication management.    Contact information:   [address], [address] [place], [place] [address] Phone: [phone] Fax: [phone]      Follow up with [facility].   Contact information:   [address], [address], [place], [place] [address] P: [phone]  Fax:       Follow-up recommendations:  Activity:  as tol Diet:  as  tol  Comments:  1.  Take all your medications as prescribed.   2.  Report any adverse side effects to outpatient provider. 3.  Patient instructed to not use alcohol or illegal drugs while on prescription medicines. 4.  In the event of worsening symptoms, instructed patient to call 911, the crisis hotline or go to nearest emergency room for evaluation of symptoms.  Signed: Lindwood QuaSheila May Orelia Brandstetter, NP [facility] 07/29/2015, 10:00 AM

## 2015-07-29 NOTE — [facility] Suicide Risk Assessment (Signed)
[facility]   Principal Problem: Schizoaffective disorder, bipolar type [facility](HCC) Discharge Diagnoses:  Patient Active Problem List   Diagnosis Date Noted  . Schizoaffective disorder, bipolar type (HCC) [F25.0] 07/25/2015    Total Time spent with patient: 30 minutes  Musculoskeletal: Strength & Muscle Tone: within normal limits Gait & Station: normal Patient leans: N/A  Psychiatric Specialty Exam: Review of Systems  Psychiatric/Behavioral: Negative for depression, suicidal ideas and hallucinations. The patient is not nervous/anxious.   All other systems reviewed and are negative.   Blood pressure 134/82, pulse 90, temperature 98.7 F (37.1 C), temperature source Oral, resp. rate 16, height 5\' 4"  (1.626 m), weight 93.895 kg (207 lb).Body mass index is 35.51 kg/(m^2).  General Appearance: Fairly Groomed  Patent attorneyye Contact::  Fair  Speech:  Normal [id number]  Volume:  Normal  Mood:  Euthymic  Affect:  Congruent  Thought Process:  Goal Directed  Orientation:  Full (Time, Place, and Person)  Thought Content:  Logical  Suicidal Thoughts:  No  Homicidal Thoughts:  No  Memory:  Immediate;   Fair Recent;   Fair Remote;   Fair  Judgement:  Fair  Insight:  Fair  Psychomotor Activity:  Normal  Concentration:  Fair  Recall:  FiservFair  Fund of Knowledge:Fair  Language: Fair  Akathisia:  No  Handed:  Right  AIMS (if indicated):     Assets:  Communication Skills Desire for Improvement  Sleep:  Number of Hours: 6.75  Cognition: WNL  ADL's:  Intact   Mental Status Per Nursing Assessment::   On Admission:  NA  Demographic Factors:  Caucasian  Loss Factors: NA  Historical Factors: Impulsivity  Risk Reduction Factors:   Positive social support  Continued Clinical Symptoms:  Previous Psychiatric Diagnoses and Treatments  Cognitive Features That Contribute To Risk:  None    Suicide Risk:  Minimal: No identifiable suicidal ideation.  Patients presenting  with no risk factors but with morbid ruminations; may be classified as minimal risk based on the severity of the depressive symptoms  Follow-up Information    [facility] up with [facility] On 08/03/2015.   Why:  at 3:00pm with Dr. Cornelia SinkBraden for medication management.    Contact information:   [address], [address] [place], [place] [address] Phone: [phone] Fax: [phone]      Follow up with [facility].   Contact information:   [address], [address], [place], [place] [address] P: [phone]  Fax:       Plan Of Care/Follow-up recommendations:  Activity:  no restrictions Diet:  regular Tests:  as needed Other:  follow up with aftercare  Araeya Lamb, MD 07/29/2015, 9:54 AM

## 2015-07-29 NOTE — Tx Team (Signed)
Interdisciplinary Treatment Plan Update (Adult) Date: 07/29/2015   Date: 07/29/2015 10:19 AM  Progress in Treatment:  Attending groups: Yes   Participating in groups: Yes Taking medication as prescribed: Yes  Tolerating medication: Yes  Family/Significant other contact made: Yes with mother Patient understands diagnosis: Yes AEB seeking help with depression and hallucinations Discussing patient identified problems/goals with staff: Yes  Medical problems stabilized or resolved: Yes  Denies suicidal/homicidal ideation: Yes Patient has not harmed self or Others: Yes   New problem(s) identified: None identified at this time.   Discharge Plan or Barriers: Pt will return home and follow-up with outpatient providers   Additional comments:  Patient and CSW reviewed pt's identified goals and treatment plan. Patient verbalized understanding and agreed to treatment plan. CSW reviewed [facility] "Discharge Process and Patient Involvement" Form. Pt verbalized understanding of information provided and signed form.   Reason for Continuation of Hospitalization:  Anxiety  Depression Medication stabilization Auditory Hallucinations  Estimated length of stay: 0 days  Review of initial/current patient goals per problem list:   1.  Goal(s): Patient will participate in aftercare plan  Met:  Yes  Target date: 3-5 days from date of admission   As evidenced by: Patient will participate within aftercare plan AEB aftercare provider and housing plan at discharge being identified.   07/26/15: Pt will return home and follow-up with outpatient providers   2.  Goal (s): Patient will exhibit decreased depressive symptoms and suicidal ideations.  Met:  Yes  Target date: 3-5 days from date of admission   As evidenced by: Patient will utilize self rating of depression at 3 or below and demonstrate decreased signs of depression or be deemed stable for discharge by MD. 07/26/15: Pt was admitted with symptoms of  depression, rating 10/10. Pt continues to present with flat affect and depressive symptoms.  Pt will demonstrate decreased symptoms of depression and rate depression at 3/10 or lower prior to discharge. 07/29/2015: Pt rates depression at 0/10; denies SI  3.  Goal(s): Patient will demonstrate decreased signs and symptoms of anxiety.  Met:  Yes  Target date: 3-5 days from date of admission   As evidenced by: Patient will utilize self rating of anxiety at 3 or below and demonstrated decreased signs of anxiety, or be deemed stable for discharge by MD 07/26/15: Pt was admitted with increased levels of anxiety and is currently rating those symptoms highly. Pt will demonstrated decreased symptoms of anxiety and rate it at 3/10 prior to d/c. 07/29/2015: Pt rates anxiety at 0/10  5.  Goal(s): Patient will demonstrate decreased signs of psychosis  . Met:  Yes . Target date: 3-5 days from date of admission  . As evidenced by: Patient will demonstrate decreased frequency of AVH or return to baseline function   -07/26/15: Pt recently admitted with auditory   Hallucinations.   -07/29/2015: Pt denies AVH and is not observed to be responding to internal stimuli  Attendees:  Patient:    Family:    Physician: Dr. Shea Evans, MD  07/29/2015 10:19 AM  Nursing: Lars Pinks, RN Case manager  07/29/2015 10:19 AM  Clinical Social Worker Peri Maris, [place] 07/29/2015 10:19 AM  Other:   07/29/2015 10:19 AM  Clinical:  Marilynne Halsted RN 07/29/2015 10:19 AM  Other: , RN Charge Nurse 07/29/2015 10:19 AM  Other:    Peri Maris, [place] Work [phone]

## 2015-07-29 NOTE — Plan of Care (Addendum)
Problem: Safety: Goal: Ability to remain free from injury will improve Outcome: Progressing Client is free from injury AEB [id number] safety checks and clients denies SI, client is compliant with medication regime.

## 2015-07-29 NOTE — Progress Notes (Signed)
[facility] :  Will you be returning to the same living situation after discharge: Yes, Pt returning home with family At discharge, do you have transportation home?: Yes, Pt uncle to pick up Do you have the ability to pay for your medications: Yes, Pt provided with prescriptions  Release of information consent forms completed and in the chart; Patient's signature needed at discharge.  Patient to Follow up at: Follow-up Information    [facility] with [facility] On 08/03/2015.   Why:  at 3:00pm with Dr. Sisters SinkBraden for medication management.    Contact information:   [address], [address] [place], [place] [address] Phone: [phone] Fax: [phone]      Follow up with [facility] On 08/08/2015.   Why:  at 11:00am for therapy with Marcelino DusterMichelle.   Contact information:   [address], [address], [place], [place] [address] P: [phone]  Fax: [phone]                               Next level of care provider [facility] to [facility] Link:no  Safety Planning and Suicide Prevention discussed: Yes, with mother; see SPE note  Have you used any form of tobacco in the last 30 days? (Cigarettes, Smokeless Tobacco, Cigars, and/or Pipes): No  Has patient been referred to the Quitline?: N/A patient is not a smoker  Patient has been referred for addiction treatment: N/A  Jessica HoopsCarter, Jessica [name] 07/29/2015, 10:24 AM

## 2015-07-29 NOTE — Progress Notes (Deleted)
  [facility]  Will you be returning to the same living situation after discharge:  Yes,  Pt returning home with family At discharge, do you have transportation home?: Yes,  Pt uncle to pick up Do you have the ability to pay for your medications: Yes,  Pt provided with prescriptions  Release of information consent forms completed and in the chart;  Patient's signature needed at discharge.  Patient to Follow up at: Follow-up Information    [facility] up with [facility] On 08/03/2015.   Why:  at 3:00pm with Dr. [place] SinkBraden for medication management.    Contact information:   [address], [address] [place], [place] [address] Phone: [phone] Fax: [phone]      Follow up with [facility].   Contact information:   [address], [address], [place], [place] [address] P: [phone]  Fax: [phone]      Next level of care provider [facility] access to [facility] Link:no  Safety Planning and Suicide Prevention discussed: Yes,  with mother; see SPE note  Have you used any form of tobacco in the last 30 days? (Cigarettes, Smokeless Tobacco, Cigars, and/or Pipes): No  Has patient been referred to the Quitline?: N/A patient is not a smoker  Patient has been referred for addiction treatment: N/A  Elaina HoopsCarter, Matisyn Cabeza M 07/29/2015, 10:24 AM

## 2015-08-03 ENCOUNTER — Encounter (HOSPITAL_COMMUNITY): Payer: Self-pay

## 2015-08-03 ENCOUNTER — Encounter (HOSPITAL_COMMUNITY): Payer: Self-pay | Admitting: Behavioral Health

## 2015-08-03 ENCOUNTER — Ambulatory Visit (HOSPITAL_COMMUNITY)
Admission: RE | Admit: 2015-08-03 | Discharge: 2015-08-03 | Disposition: A | Discharge status: Home / Self Care | Payer: BLUE CROSS/BLUE SHIELD | Attending: Psychiatry | Admitting: Psychiatry

## 2015-08-03 ENCOUNTER — Emergency Department (HOSPITAL_COMMUNITY)
Admission: EM | Admit: 2015-08-03 | Discharge: 2015-08-04 | Disposition: A | Discharge status: Other Acute Inpatient Hospital | Payer: BLUE CROSS/BLUE SHIELD | Attending: Emergency Medicine | Admitting: Emergency Medicine

## 2015-08-03 DIAGNOSIS — F319 Bipolar disorder, unspecified: Secondary | ICD-10-CM | POA: Insufficient documentation

## 2015-08-03 DIAGNOSIS — F250 Schizoaffective disorder, bipolar type: Secondary | ICD-10-CM | POA: Diagnosis not present

## 2015-08-03 DIAGNOSIS — R443 Hallucinations, unspecified: Secondary | ICD-10-CM | POA: Diagnosis not present

## 2015-08-03 DIAGNOSIS — Z79899 Other long term (current) drug therapy: Secondary | ICD-10-CM | POA: Insufficient documentation

## 2015-08-03 DIAGNOSIS — N390 Urinary tract infection, site not specified: Secondary | ICD-10-CM | POA: Insufficient documentation

## 2015-08-03 DIAGNOSIS — R1031 Right lower quadrant pain: Secondary | ICD-10-CM | POA: Diagnosis not present

## 2015-08-03 LAB — URINE MICROSCOPIC-ADD ON

## 2015-08-03 LAB — URINALYSIS, ROUTINE W REFLEX MICROSCOPIC
Bilirubin Urine: NEGATIVE
GLUCOSE, UA: NEGATIVE mg/dL
HGB URINE DIPSTICK: NEGATIVE
Ketones, ur: 15 mg/dL — AB
Nitrite: NEGATIVE
PH: 5.5 (ref 5.0–8.0)
Protein, ur: NEGATIVE mg/dL
Specific Gravity, Urine: 1.034 — ABNORMAL HIGH (ref 1.005–1.030)

## 2015-08-03 LAB — CBC WITH DIFFERENTIAL/PLATELET
BASOS ABS: 0 10*3/uL (ref 0.0–0.1)
BASOS PCT: 0 %
Eosinophils Absolute: 0.1 10*3/uL (ref 0.0–0.7)
Eosinophils Relative: 1 %
HEMATOCRIT: 40.8 % (ref 36.0–46.0)
Hemoglobin: 14 g/dL (ref 12.0–15.0)
LYMPHS PCT: 17 %
Lymphs Abs: 2.8 10*3/uL (ref 0.7–4.0)
MCH: 30.1 pg (ref 26.0–34.0)
MCHC: 34.3 g/dL (ref 30.0–36.0)
MCV: 87.7 fL (ref 78.0–100.0)
MONO ABS: 1.5 10*3/uL — AB (ref 0.1–1.0)
Monocytes Relative: 9 %
NEUTROS ABS: 11.9 10*3/uL — AB (ref 1.7–7.7)
NEUTROS PCT: 73 %
Platelets: 346 10*3/uL (ref 150–400)
RBC: 4.65 MIL/uL (ref 3.87–5.11)
RDW: 13.7 % (ref 11.5–15.5)
WBC: 16.3 10*3/uL — AB (ref 4.0–10.5)

## 2015-08-03 LAB — COMPREHENSIVE METABOLIC PANEL
ALT: 16 U/L (ref 14–54)
ANION GAP: 9 (ref 5–15)
AST: 15 U/L (ref 15–41)
Albumin: 4.6 g/dL (ref 3.5–5.0)
Alkaline Phosphatase: 79 U/L (ref 38–126)
BILIRUBIN TOTAL: 0.8 mg/dL (ref 0.3–1.2)
BUN: 17 mg/dL (ref 6–20)
CHLORIDE: 106 mmol/L (ref 101–111)
CO2: 22 mmol/L (ref 22–32)
Calcium: 8.8 mg/dL — ABNORMAL LOW (ref 8.9–10.3)
Creatinine, Ser: 0.66 mg/dL (ref 0.44–1.00)
Glucose, Bld: 113 mg/dL — ABNORMAL HIGH (ref 65–99)
POTASSIUM: 3.8 mmol/L (ref 3.5–5.1)
Sodium: 137 mmol/L (ref 135–145)
TOTAL PROTEIN: 8.2 g/dL — AB (ref 6.5–8.1)

## 2015-08-03 LAB — RAPID URINE DRUG SCREEN, HOSP PERFORMED
AMPHETAMINES: NOT DETECTED
BARBITURATES: NOT DETECTED
BENZODIAZEPINES: NOT DETECTED
COCAINE: NOT DETECTED
Opiates: NOT DETECTED
Tetrahydrocannabinol: NOT DETECTED

## 2015-08-03 LAB — POC URINE PREG, ED: PREG TEST UR: NEGATIVE

## 2015-08-03 LAB — ETHANOL

## 2015-08-03 MED ORDER — HYDROXYZINE HCL 25 MG PO TABS
25.0000 mg | ORAL_TABLET | Freq: Four times a day (QID) | ORAL | Status: DC | PRN
  Administered 2015-08-04: 25 mg via ORAL
  Filled 2015-08-03: qty 1

## 2015-08-03 MED ORDER — LURASIDONE HCL 20 MG PO TABS
20.0000 mg | ORAL_TABLET | Freq: Every day | ORAL | Status: DC
  Filled 2015-08-03: qty 1

## 2015-08-03 MED ORDER — TRAZODONE HCL 50 MG PO TABS: 50.0000 mg | ORAL_TABLET | Freq: Every evening | ORAL | Status: DC | PRN

## 2015-08-03 MED ORDER — VITAMIN D3 25 MCG (1000 UNIT) PO TABS
1000.0000 [IU] | ORAL_TABLET | Freq: Every day | ORAL | Status: DC
  Filled 2015-08-03: qty 1

## 2015-08-03 MED ORDER — CEPHALEXIN 500 MG PO CAPS
500.0000 mg | ORAL_CAPSULE | Freq: Three times a day (TID) | ORAL | Status: DC
Start: 2015-08-04 — End: 2015-08-04
  Administered 2015-08-04: 500 mg via ORAL
  Filled 2015-08-03 (×2): qty 1

## 2015-08-03 MED ORDER — LURASIDONE HCL 20 MG PO TABS
20.0000 mg | ORAL_TABLET | Freq: Once | ORAL | Status: AC
  Administered 2015-08-03: 20 mg via ORAL
  Filled 2015-08-03: qty 1

## 2015-08-03 MED ORDER — CEPHALEXIN 500 MG PO CAPS: 500.0000 mg | ORAL_CAPSULE | Freq: Two times a day (BID) | ORAL | Status: DC

## 2015-08-03 MED ORDER — BUPROPION HCL ER (XL) 150 MG PO TB24
150.0000 mg | ORAL_TABLET | Freq: Every day | ORAL | Status: DC
  Filled 2015-08-03: qty 1

## 2015-08-03 NOTE — Progress Notes (Signed)
This writer completed a chart review for disposition.    Kennya Schwenn, MSW, LCSW, LCAS [facility] Triage Specialist [phone] 

## 2015-08-03 NOTE — ED Notes (Signed)
Pt here with auditory hallucinations.  Pt is bipolar and left [facility] inpatient last week.  Went home and was doing well for a couple of days.  Hallucinations and depression is back.  Pt tearful on assessment.  Voices do not give command.  Hx of suicidal ideation but pt denies at this time.  Mom at bedside.

## 2015-08-03 NOTE — Discharge Instructions (Signed)
Take your antibiotic as prescribed be sure to complete the full course.  Return to emergency department if you experience bloody urine, fever, chills, back pain, nausea, vomiting.  Urinary Tract Infection Urinary tract infections (UTIs) can develop anywhere along your urinary tract. Your urinary tract is your body's drainage system for removing wastes and extra water. Your urinary tract includes two kidneys, two ureters, a bladder, and a urethra. Your kidneys are a pair of bean-shaped organs. Each kidney is about the size of your fist. They are located below your ribs, one on each side of your spine. CAUSES Infections are caused by microbes, which are microscopic organisms, including fungi, viruses, and bacteria. These organisms are so small that they can only be seen through a microscope. Bacteria are the microbes that most commonly cause UTIs. SYMPTOMS  Symptoms of UTIs may vary by age and gender of the patient and by the location of the infection. Symptoms in young women typically include a frequent and intense urge to urinate and a painful, burning feeling in the bladder or urethra during urination. Older women and men are more likely to be tired, shaky, and weak and have muscle aches and abdominal pain. A fever may mean the infection is in your kidneys. Other symptoms of a kidney infection include pain in your back or sides below the ribs, nausea, and vomiting. DIAGNOSIS To diagnose a UTI, your caregiver will ask you about your symptoms. Your caregiver will also ask you to provide a urine sample. The urine sample will be tested for bacteria and white blood cells. White blood cells are made by your body to help fight infection. TREATMENT  Typically, UTIs can be treated with medication. Because most UTIs are caused by a bacterial infection, they usually can be treated with the use of antibiotics. The choice of antibiotic and length of treatment depend on your symptoms and the type of bacteria causing  your infection. HOME CARE INSTRUCTIONS  If you were prescribed antibiotics, take them exactly as your caregiver instructs you. Finish the medication even if you feel better after you have only taken some of the medication.  Drink enough water and fluids to keep your urine clear or pale yellow.  Avoid caffeine, tea, and carbonated beverages. They tend to irritate your bladder.  Empty your bladder often. Avoid holding urine for long periods of time.  Empty your bladder before and after sexual intercourse.  After a bowel movement, women should cleanse from front to back. Use each tissue only once. SEEK MEDICAL CARE IF:   You have back pain.  You develop a fever.  Your symptoms do not begin to resolve within 3 days. SEEK IMMEDIATE MEDICAL CARE IF:   You have severe back pain or lower abdominal pain.  You develop chills.  You have nausea or vomiting.  You have continued burning or discomfort with urination. MAKE SURE YOU:   Understand these instructions.  Will watch your condition.  Will get help right away if you are not doing well or get worse.   This information is not intended to replace advice given to you by your health care provider. Make sure you discuss any questions you have with your health care provider.   Document Released: 11/22/2004 Document Revised: 11/03/2014 Document Reviewed: 03/23/2011 Elsevier Interactive Patient Education [employer] Elsevier Inc.

## 2015-08-03 NOTE — BH Assessment (Signed)
Assessment Note  Jessica [name] is a Caucasian 40 y.o. female who presented to [facility] voluntarily as a walk-in.  She complained of depressed mood and auditory/visual hallucination.  Pt was recently discharged from [facility] (last assessed on 07/15/15) where she was treated for Bipolar I.  History collected from Pt and from Pt's mother Jessica [name] -- [phone]).  Pt reported as follows:  That she has been experiencing auditory/visual hallucinations -- hearing voices, seeing people.  Pt had very poor eye contact, stared at the floor during the entire assessment, rocked back and forth, was very slow in responding to questions, appeared to experience internal cues and thought-blocking, and appeared to author to be preoccupied and anxious.  When asked if Pt heard voices commanding her to harm herself, she paused a long period of time and responded, "I'm not sure."  Pt reported that she is med compliant (takes approx. 100 mg Wellbutrin in AM, and Latuda in evening).  When asked about triggers for her mood, Pt said she is "stressed out," but could not point to any particular trigger.  Pt said she would like to come inpatient.  Out of Pt's presence, author spoke with Pt's mother.  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  During assessment, Pt's demeanor was very anxious.  She was polite and cooperative, had very poor eye contact, appeared hunched over, and spoke very slowly and after long pauses.  Pt reported mood as depressed and affect was preoccupied.  Pt endorsed despondency, insomnia, fatigue, and self-isolation.  Mother reported that Pt is not tending to self-care.  Pt denied suicidal or homicidal ideation, and she  also denied substance use.  Pt stated that she is experiencing auditory and visual hallucinations -- voices of people, visions of people.  She would not confirm if they were commanding her to self-injure or injure others.  Pt denied self-injurious behavior such as cutting, burning, or punching.  Thought processes were slow and consistent with thought-blocking; thought content indicated hallucination.  Memory was grossly intact.  Concentration was very poor.  Author had to repeat questions several times, and Pt would pause and grimace before answering.  Pt's speech was slow and soft.  Judgment and insight were deemed poor.  Impulse control was deemed fair.  Consulted with A. Nwoko, NP, who determined that Pt meets inpatient criteria due to ongoing hallucination.  Diagnosis: Bipolar depressed, severe with psychosis  Past Medical History:  Past Medical History  Diagnosis Date  . Bipolar 1 disorder (HCC)     No past surgical history on file.  Family History:  Family History  Problem Relation Age of Onset  . Hypertension Mother     Social History:  reports that she has never smoked. She does not have any smokeless tobacco history on file. She reports that she does not drink alcohol or use illicit drugs.  Additional Social History:  Alcohol / Drug Use Pain Medications: See PTA Prescriptions: See PTA Over the Counter: See PTA History of alcohol / drug use?: No history of alcohol / drug abuse  CIWA:   COWS:    Allergies:  Allergies  Allergen Reactions  . Prednisone     Prednisone psychosis with anxiety.  Home Medications:  (Not in a hospital admission)  OB/GYN Status:  No LMP recorded.  General Assessment Data [facility] TTS Assessment: In system Is this a Tele or Face-to-Face Assessment?: Face-to-Face Is this an Initial Assessment or a Re-assessment for this encounter?: Initial Assessment Marital status: Single Is patient pregnant?:  No Pregnancy Status: No Living Arrangements: Parent (Mother Jessica [name] -- [phone]) Can pt return to current living arrangement?: Yes Admission Status: Voluntary Is patient capable of signing voluntary admission?: Yes Referral Source: Self/Family/Friend Insurance type: BCBS  Medical Screening Exam [facility] Walk[facility]in ONLY) Medical Exam completed: No Reason for MSE not completed: Other: (Walk[facility])  Crisis Care Plan Living Arrangements: Parent (Mother Jessica [name] -- [phone]) Name of Psychiatrist: Dr. Geannie RisenBuck Braden, DO Name of Therapist: [facility]  Education Status Is patient currently in school?: No Highest grade of school patient has completed: Master's Degree in Horticulture   Risk to self with the past 6 months Suicidal Ideation: No Has patient been a risk to self within the past 6 months prior to admission? : Yes Suicidal Intent: No Has patient had any suicidal intent within the past 6 months prior to admission? : No Is patient at risk for suicide?: No Suicidal Plan?: No Has patient had any suicidal plan within the past 6 months prior to admission? : No Access to Means: No What has been your use of drugs/alcohol within the last 12 months?: Denies Previous Attempts/Gestures: No Intentional Self Injurious Behavior: None Family Suicide History: No Recent stressful life event(s): Other (Comment) (Not sleeping; possible conflict at work) Persecutory voices/beliefs?: Yes Depression: Yes Depression Symptoms: Despondent, Insomnia, Tearfulness, Isolating Substance abuse history and/or treatment for substance abuse?: No Suicide prevention information given to non-admitted patients: Not applicable  Risk to Others within the past 6 months Homicidal Ideation: No Does patient have any lifetime risk of violence toward others beyond the six months prior to admission? : No Thoughts of Harm to Others: No Current Homicidal Intent: No Current  Homicidal Plan: No Access to Homicidal Means: No History of harm to others?: No Assessment of Violence: None Noted Does patient have access to weapons?: No Criminal Charges Pending?: No Does patient have a court date: No Is patient on probation?: No  Psychosis Hallucinations: Auditory, With command, Visual Delusions: Unspecified (Possible religious delusion, per mother)  Mental Status Report Appearance/Hygiene: Unremarkable Eye Contact: Poor Motor Activity: Mannerisms Speech: Soft, Slow Level of Consciousness: Alert Mood: Preoccupied, Helpless, Apprehensive Affect: Preoccupied, Anxious Anxiety Level: Severe Thought Processes: Thought Blocking Judgement: Impaired Orientation: Person, Place, Time, Situation Obsessive Compulsive Thoughts/Behaviors: None  Cognitive Functioning Concentration: Poor Memory: Remote Intact, Recent Intact IQ: Average Insight: Poor Impulse Control: Fair Appetite: Poor Sleep: Decreased Vegetative Symptoms: Staying in bed, Decreased grooming (Per mother's report)  ADLScreening [facility] Assessment Services) Patient's cognitive ability adequate to safely complete daily activities?: Yes Patient able to express need for assistance with ADLs?: Yes Independently performs ADLs?: Yes (appropriate for developmental age)  Prior Inpatient Therapy Prior Inpatient Therapy: Yes Prior Therapy Dates: May 2017 [facility] Reason for Treatment: Bipolar   Prior Outpatient Therapy Prior Outpatient Therapy: Yes Prior Therapy Dates: Ongoing Prior Therapy Facilty/Provider(s): [facility] Reason for Treatment: Bipolar Does patient have an ACCT team?: No Does patient have Intensive In-House Services?  : No Does patient have [facility] services? : No Does patient have P4CC services?: No  ADL Screening (condition at time of admission) Patient's cognitive ability adequate to safely  complete daily activities?: Yes Is the patient deaf or  have difficulty hearing?: No Does the patient have difficulty seeing, even when wearing glasses/contacts?: No Does the patient have difficulty concentrating, remembering, or making decisions?: No Patient able to express need for assistance with ADLs?: Yes Does the patient have difficulty dressing or bathing?: No Independently performs ADLs?: Yes (appropriate for developmental age) Does the patient have difficulty walking or climbing stairs?: No Weakness of Legs: None Weakness of Arms/Hands: None  Home Assistive Devices/Equipment Home Assistive Devices/Equipment: None  Therapy Consults (therapy consults require a physician order) PT Evaluation Needed: No OT Evalulation Needed: No SLP Evaluation Needed: No Abuse/Neglect Assessment (Assessment to be complete while patient is alone) Physical Abuse: Denies Verbal Abuse: Denies Sexual Abuse: Denies Values / Beliefs Cultural Requests During Hospitalization: None Spiritual Requests During Hospitalization: None Consults Spiritual Care Consult Needed: No Social Work Consult Needed: No Merchant navy officer (For Healthcare) Does patient have an advance directive?: No Would patient like information on creating an advanced directive?: No - patient declined information    Additional Information 1:1 In Past 12 Months?: No CIRT Risk: No Elopement Risk: No Does patient have medical clearance?: No     Disposition:  Disposition Initial Assessment Completed for this Encounter: Yes Disposition of Patient: Inpatient treatment program Type of inpatient treatment program: Adult (Per A Nwoko, NP, Pt meets inpt criteria)  On Site Evaluation by:   Reviewed with Physician:    Dorris Fetch Ritu Gagliardo 08/03/2015 3:17 PM

## 2015-08-03 NOTE — ED Notes (Addendum)
Patient has been wanded by security.  

## 2015-08-03 NOTE — ED Provider Notes (Signed)
CSN: [id number]     Arrival date & time 08/03/15  1522 History   First MD Initiated Contact with Patient 08/03/15 [phone]     Chief Complaint  Patient presents with  . Hallucinations     (Consider location/radiation/quality/duration/timing/severity/associated sxs/prior Treatment) HPI   Patient is a 40 year old female with a history of depression, bipolar 1 disorder who presents with intermittent hallucinations since 2008. Mom was the main historian. Mom states a concern of "disconnected". She states the patient will ask questions and 10 minutes later asked the same questions. Mom is concerned that there is decreased cognitive thinking. Mom states patient was picked up from [facility] 5 days ago and was doing quite well. She states 2 days ago the patient became more lethargic, depressed, slept throughout most of the day, did not go to work yesterday and will not eat. Patient states she just needs help. Patient is complaining of right lower quadrant abdominal pain that's been intermittent for over a week now. She said pain is nonradiating, relieved with flatulence, and more of a discomfort. Patient denies nausea, vomiting, diarrhea, fever, chills. She does endorse mild constipation. Mom states patient is taking her medications. Patient states she has no suicidal or homicidal ideations.   Past Medical History  Diagnosis Date  . Bipolar 1 disorder (HCC)    History reviewed. No pertinent past surgical history. Family History  Problem Relation Age of Onset  . Hypertension Mother    Social History  Substance Use Topics  . Smoking status: Never Smoker   . Smokeless tobacco: None  . Alcohol Use: No   OB History    No data available     Review of Systems  Constitutional: Negative for fever and chills.  Respiratory: Negative for shortness of breath.   Cardiovascular: Negative for chest pain.  Gastrointestinal: Positive for constipation. Negative for nausea, vomiting, diarrhea and blood in stool.   Genitourinary: Negative for dysuria and hematuria.  Psychiatric/Behavioral: Positive for hallucinations, sleep disturbance and dysphoric mood. Negative for suicidal ideas.      Allergies  Prednisone  Home Medications   Prior to Admission medications   Medication Sig Start Date End Date Taking? Authorizing Provider  buPROPion (WELLBUTRIN XL) 150 MG 24 hr tablet Take 1 tablet (150 mg total) by mouth daily. 07/29/15  Yes Adonis Brook, NP  cholecalciferol (VITAMIN D) 1000 units tablet Take 1 tablet (1,000 Units total) by mouth daily. 07/29/15  Yes Adonis Brook, NP  lurasidone (LATUDA) 20 MG TABS tablet Take 1 tablet (20 mg total) by mouth daily after supper. 07/29/15  Yes Adonis Brook, NP  cephALEXin (KEFLEX) 500 MG capsule Take 1 capsule (500 mg total) by mouth 2 (two) times daily. 08/03/15   Jerre Simon, PA  hydrOXYzine (ATARAX/VISTARIL) 25 MG tablet Take 1 tablet (25 mg total) by mouth every 6 (six) hours as needed for anxiety. 07/29/15   Adonis Brook, NP  traZODone (DESYREL) 50 MG tablet Take 1 tablet (50 mg total) by mouth at bedtime as needed for sleep. 07/29/15   Adonis Brook, NP   BP 139/103 mmHg  Pulse 94  Temp(Src) 98.2 F (36.8 C) (Oral)  SpO2 94% Physical Exam  Constitutional: She appears well-developed and well-nourished. No distress.  HENT:  Head: Normocephalic and atraumatic.  Eyes: Conjunctivae are normal.  Cardiovascular: Normal rate, regular rhythm and normal heart sounds.  Exam reveals no gallop and no friction rub.   No murmur heard. Pulmonary/Chest: Effort normal.  Abdominal: Soft. Bowel sounds are normal. She exhibits  no distension. There is no rebound and no guarding.  Mild TTP to RLQ  Musculoskeletal: Normal range of motion.  Neurological: She is alert. Coordination normal.  Skin: Skin is warm and dry. She is not diaphoretic.  Psychiatric: Thought content normal. Her speech is delayed. She is withdrawn. She exhibits a depressed mood.  Nursing note and  vitals reviewed.   ED Course  Procedures (including critical care time) Labs Review Labs Reviewed  COMPREHENSIVE METABOLIC PANEL - Abnormal; Notable for the following:    Glucose, Bld 113 (*)    Calcium 8.8 (*)    Total Protein 8.2 (*)    All other components within normal limits  CBC WITH DIFFERENTIAL/PLATELET - Abnormal; Notable for the following:    WBC 16.3 (*)    Neutro Abs 11.9 (*)    Monocytes Absolute 1.5 (*)    All other components within normal limits  URINALYSIS, ROUTINE W REFLEX MICROSCOPIC (NOT AT [facility]) - Abnormal; Notable for the following:    Color, Urine AMBER (*)    APPearance TURBID (*)    Specific Gravity, Urine 1.034 (*)    Ketones, ur 15 (*)    Leukocytes, UA SMALL (*)    All other components within normal limits  URINE MICROSCOPIC-ADD ON - Abnormal; Notable for the following:    Squamous Epithelial / LPF 6-30 (*)    Bacteria, UA MANY (*)    All other components within normal limits  ETHANOL  URINE RAPID DRUG SCREEN, HOSP PERFORMED  POC URINE PREG, ED    Imaging Review No results found. I have personally reviewed and evaluated these images and lab results as part of my medical decision-making.   EKG Interpretation None      MDM   Final diagnoses:  UTI (lower urinary tract infection)  Hallucinations   Patient presents with hallucinations, depressed mood and mild right lower quadrant abdominal pain. [facility]. Patient with a white blood cell count of 16.3. UA revealed UTI likely the cause of her right lower quadrant abdominal pain. Patient is afebrile, VSS, no rebound pain no guarding, no vomiting less concerning for appendicitis. Patient denies vaginal discharge or bleeding so less likely GU etiology. Patient states her right lower quadrant pain as discomfort in no acute abdomen on exam. [facility] Keflex. Will culture her urine.  Patient and mother states she takes her  Latuda around 7:30 PM. We will give patient her dose of Latuda before transfer. Patient with elevated blood pressure upon arrival 139/103 but repeat was 133/93. Blood pressure elevated likely 2/2 anxiety.  Patient is stable and medically cleared.    Jerre SimonJessica L Madicyn Mesina, PA 08/03/15 2102  Rolland PorterMark James, MD 08/04/15 [phone]

## 2015-08-04 ENCOUNTER — Inpatient Hospital Stay (HOSPITAL_COMMUNITY): Admission: AD | Admit: 2015-08-04 | Payer: BLUE CROSS/BLUE SHIELD | Source: Intra-hospital | Admitting: Psychiatry

## 2015-08-04 DIAGNOSIS — F250 Schizoaffective disorder, bipolar type: Secondary | ICD-10-CM

## 2015-08-04 MED ORDER — LURASIDONE HCL 40 MG PO TABS
40.0000 mg | ORAL_TABLET | Freq: Every day | ORAL | Status: DC
  Filled 2015-08-04: qty 1

## 2015-08-04 MED ORDER — OXCARBAZEPINE 300 MG PO TABS
300.0000 mg | ORAL_TABLET | Freq: Two times a day (BID) | ORAL | Status: DC
  Administered 2015-08-04: 300 mg via ORAL
  Filled 2015-08-04: qty 1

## 2015-08-04 NOTE — ED Notes (Addendum)
Pt is very withdrawn.  I have encouraged patient multiple times to eat and or drink.  Pt states she will eat at some point but there is no evidence of anything having been eaten.  She denies S/I and H/I but refused to answer about AVH.  15 minute checks and video monitoring continue.

## 2015-08-04 NOTE — Progress Notes (Signed)
CSW faxed magistrate IVC document.  CSW followed up with magistrate to ensure that they received the document. Magistrate states that she has received the document, but does not feel the description meets criteria to commit the patient.   CSW made EDP aware. [facility] aware.  Trish MageBrittney Yessica Putnam, LCSWA [phone] ED CSW 08/04/2015 8:11 PM

## 2015-08-04 NOTE — Consult Note (Signed)
[facility] Consult   Reason for Consult:  Psychosis  Referring Physician:  EDP Patient Identification: Jessica [name] MRN:  [id number] Principal Diagnosis: Schizoaffective disorder, bipolar type [facility]) Diagnosis:   Patient Active Problem List   Diagnosis Date Noted  . Schizoaffective disorder, bipolar type ([name]) [F25.0] 07/25/2015    Priority: High    Total Time spent with patient: 45 minutes  Subjective:   Jessica [name] is a 40 y.o. female patient admitted with psychosis.  HPI:  On admission:  40 y.o. female who presented to [facility] voluntarily as a walk-in. She complained of depressed mood and auditory/visual hallucination. Pt was recently discharged from [facility] (last assessed on 07/15/15) where she was treated for Bipolar I. History collected from Pt and from Pt's mother Jessica [name] -- [phone]). Pt reported as follows: That she has been experiencing auditory/visual hallucinations -- hearing voices, seeing people. Pt had very poor eye contact, stared at the floor during the entire assessment, rocked back and forth, was very slow in responding to questions, appeared to experience internal cues and thought-blocking, and appeared to author to be preoccupied and anxious. When asked if Pt heard voices commanding her to harm herself, she paused a long period of time and responded, "I'm not sure." Pt reported that she is med compliant (takes approx. 100 mg Wellbutrin in AM, and Latuda in evening). When asked about triggers for her mood, Pt said she is "stressed out," but could not point to any particular trigger. Pt said she would like to come inpatient.  Out of Pt's presence, author spoke with Pt's mother. 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  During assessment, Pt's demeanor was very anxious. She was polite and cooperative, had very poor eye contact, appeared hunched over, and spoke very slowly and after long pauses. Pt reported mood as depressed and affect was preoccupied. Pt endorsed despondency, insomnia, fatigue, and self-isolation. Mother reported that Pt is not tending to self-care. Pt denied suicidal or homicidal ideation, and she also denied substance use. Pt stated that she is experiencing auditory and visual hallucinations -- voices of people, visions of people. She would not confirm if they were commanding her to self-injure or injure others. Pt denied self-injurious behavior such as cutting, burning, or punching. Thought processes were slow and consistent with thought-blocking; thought content indicated hallucination. Memory was grossly intact. Concentration was very poor. Author had to repeat questions several times, and Pt would pause and grimace before answering. Pt's speech was slow and soft. Judgment and insight were deemed poor. Impulse control was deemed fair.  Today:  Patient remains depressed and responding to internal stimuli.  She keeps her head down throughout the assessment and while walking around, does not look up.   Past Psychiatric History: schizoaffective disorder, bipolar type  Risk to Self: Is patient at risk for suicide?: No, but patient needs Medical Clearance Risk to Others:   None Prior Inpatient Therapy:  [facility] Prior Outpatient Therapy:  Yes  Past Medical History:  Past Medical History  Diagnosis Date  . Bipolar 1 disorder ([name])    History reviewed. No pertinent past surgical history. Family History:  Family History  Problem Relation Age of Onset  .  Hypertension Mother    Family Psychiatric  History: none Social History:  History  Alcohol Use No     History  Drug Use No    Social History    Social History  . Marital Status: Single    Spouse Name: N/A  . Number of Children: N/A  . Years of Education: N/A   Social History Main Topics  . Smoking status: Never Smoker   . Smokeless tobacco: None  . Alcohol Use: No  . Drug Use: No  . Sexual Activity: No   Other Topics Concern  . None   Social History Narrative   Additional Social History:    Allergies:   Allergies  Allergen Reactions  . Prednisone     Prednisone psychosis with anxiety.    Labs:  Results for orders placed or performed during the hospital encounter of 08/03/15 (from the past 48 hour(s))  Ethanol     Status: None   Collection Time: 08/03/15  5:09 PM  Result Value Ref Range   Alcohol, Ethyl (B) <5 <5 mg/dL    Comment:        LOWEST DETECTABLE LIMIT FOR SERUM ALCOHOL IS 5 mg/dL FOR MEDICAL PURPOSES ONLY   Comprehensive metabolic panel     Status: Abnormal   Collection Time: 08/03/15  5:12 PM  Result Value Ref Range   Sodium 137 135 - 145 mmol/L   Potassium 3.8 3.5 - 5.1 mmol/L   Chloride 106 101 - 111 mmol/L   CO2 22 22 - 32 mmol/L   Glucose, Bld 113 (H) 65 - 99 mg/dL   BUN 17 6 - 20 mg/dL   Creatinine, Ser 0.66 0.44 - 1.00 mg/dL   Calcium 8.8 (L) 8.9 - 10.3 mg/dL   Total Protein 8.2 (H) 6.5 - 8.1 g/dL   Albumin 4.6 3.5 - 5.0 g/dL   AST 15 15 - 41 U/L   ALT 16 14 - 54 U/L   Alkaline Phosphatase 79 38 - 126 U/L   Total Bilirubin 0.8 0.3 - 1.2 mg/dL   GFR calc non Af Amer >60 >60 mL/min   GFR calc Af Amer >60 >60 mL/min    Comment: (NOTE) The eGFR has been calculated using the CKD EPI equation. This calculation has not been validated in all clinical situations. eGFR's persistently <60 mL/min signify possible Chronic Kidney Disease.    Anion gap 9 5 - 15  CBC with Diff     Status: Abnormal   Collection Time: 08/03/15  5:12 PM  Result Value Ref Range   WBC 16.3 (H) 4.0 - 10.5 K/uL   RBC 4.65 3.87 - 5.11 MIL/uL   Hemoglobin 14.0 12.0 - 15.0 g/dL   HCT 40.8 36.0 - 46.0 %   MCV  87.7 78.0 - 100.0 fL   MCH 30.1 26.0 - 34.0 pg   MCHC 34.3 30.0 - 36.0 g/dL   RDW 13.7 11.5 - 15.5 %   Platelets 346 150 - 400 K/uL   Neutrophils Relative % 73 %   Neutro Abs 11.9 (H) 1.7 - 7.7 K/uL   Lymphocytes Relative 17 %   Lymphs Abs 2.8 0.7 - 4.0 K/uL   Monocytes Relative 9 %   Monocytes Absolute 1.5 (H) 0.1 - 1.0 K/uL   Eosinophils Relative 1 %   Eosinophils Absolute 0.1 0.0 - 0.7 K/uL   Basophils Relative 0 %   Basophils Absolute 0.0 0.0 - 0.1 K/uL  POC Urine Pregnancy, ED  (not at [facility])  Status: None   Collection Time: 08/03/15  5:33 PM  Result Value Ref Range   Preg Test, Ur NEGATIVE NEGATIVE    Comment:        THE SENSITIVITY OF THIS METHODOLOGY IS >24 mIU/mL   Urine rapid drug screen (hosp performed)not at [facility]     Status: None   Collection Time: 08/03/15  5:44 PM  Result Value Ref Range   Opiates NONE DETECTED NONE DETECTED   Cocaine NONE DETECTED NONE DETECTED   Benzodiazepines NONE DETECTED NONE DETECTED   Amphetamines NONE DETECTED NONE DETECTED   Tetrahydrocannabinol NONE DETECTED NONE DETECTED   Barbiturates NONE DETECTED NONE DETECTED    Comment:        DRUG SCREEN FOR MEDICAL PURPOSES ONLY.  IF CONFIRMATION IS NEEDED FOR ANY PURPOSE, NOTIFY LAB WITHIN 5 DAYS.        LOWEST DETECTABLE LIMITS FOR URINE DRUG SCREEN Drug Class       Cutoff (ng/mL) Amphetamine      1000 Barbiturate      200 Benzodiazepine   384 Tricyclics       665 Opiates          300 Cocaine          300 THC              50   Urinalysis, Routine w reflex microscopic     Status: Abnormal   Collection Time: 08/03/15  5:44 PM  Result Value Ref Range   Color, Urine AMBER (A) YELLOW    Comment: BIOCHEMICALS MAY BE AFFECTED BY COLOR   APPearance TURBID (A) CLEAR   Specific Gravity, Urine 1.034 (H) 1.005 - 1.030   pH 5.5 5.0 - 8.0   Glucose, UA NEGATIVE NEGATIVE mg/dL   Hgb urine dipstick NEGATIVE NEGATIVE   Bilirubin Urine NEGATIVE NEGATIVE   Ketones, ur 15 (A) NEGATIVE mg/dL    Protein, ur NEGATIVE NEGATIVE mg/dL   Nitrite NEGATIVE NEGATIVE   Leukocytes, UA SMALL (A) NEGATIVE  Urine microscopic-add on     Status: Abnormal   Collection Time: 08/03/15  5:44 PM  Result Value Ref Range   Squamous Epithelial / LPF 6-30 (A) NONE SEEN   WBC, UA 6-30 0 - 5 WBC/hpf   RBC / HPF 0-5 0 - 5 RBC/hpf   Bacteria, UA MANY (A) NONE SEEN   Urine-Other AMORPHOUS URATES/PHOSPHATES     Current Facility-Administered Medications  Medication Dose Route Frequency Provider Last Rate Last Dose  . buPROPion (WELLBUTRIN XL) 24 hr tablet 150 mg  150 mg Oral Daily Jessica L Focht, PA      . cephALEXin (KEFLEX) capsule 500 mg  500 mg Oral TID Kalman Drape, PA   500 mg at 08/04/15 0059  . cholecalciferol (VITAMIN D) tablet 1,000 Units  1,000 Units Oral Daily Jessica L Focht, PA      . hydrOXYzine (ATARAX/VISTARIL) tablet 25 mg  25 mg Oral Q6H PRN Kalman Drape, PA   25 mg at 08/04/15 0305  . lurasidone (LATUDA) tablet 20 mg  20 mg Oral QPC supper Kalman Drape, PA      . traZODone (DESYREL) tablet 50 mg  50 mg Oral QHS PRN Kalman Drape, PA       Current Outpatient Prescriptions  Medication Sig Dispense Refill  . buPROPion (WELLBUTRIN XL) 150 MG 24 hr tablet Take 1 tablet (150 mg total) by mouth daily. 30 tablet 0  . cholecalciferol (VITAMIN D) 1000 units tablet Take 1  tablet (1,000 Units total) by mouth daily. 30 tablet 0  . lurasidone (LATUDA) 20 MG TABS tablet Take 1 tablet (20 mg total) by mouth daily after supper. 30 tablet 0  . cephALEXin (KEFLEX) 500 MG capsule Take 1 capsule (500 mg total) by mouth 2 (two) times daily. 14 capsule 0  . hydrOXYzine (ATARAX/VISTARIL) 25 MG tablet Take 1 tablet (25 mg total) by mouth every 6 (six) hours as needed for anxiety. 30 tablet 0  . traZODone (DESYREL) 50 MG tablet Take 1 tablet (50 mg total) by mouth at bedtime as needed for sleep. 30 tablet 0    Musculoskeletal: Strength & Muscle Tone: within normal limits Gait & Station:  normal Patient leans: N/A  Psychiatric Specialty Exam: Physical Exam  Constitutional: She is oriented to person, place, and time. She appears well-developed and well-nourished.  HENT:  Head: Normocephalic.  Neck: Normal range of motion.  Respiratory: Effort normal.  Musculoskeletal: Normal range of motion.  Neurological: She is alert and oriented to person, place, and time.  Skin: Skin is warm and dry.  Psychiatric: Her mood appears anxious. Her speech is delayed. She is actively hallucinating. Thought content is paranoid. Cognition and memory are impaired. She expresses inappropriate judgment. She exhibits a depressed mood.    Review of Systems  Constitutional: Negative.   HENT: Negative.   Eyes: Negative.   Respiratory: Negative.   Cardiovascular: Negative.   Gastrointestinal: Negative.   Genitourinary: Negative.   Musculoskeletal: Negative.   Skin: Negative.   Neurological: Negative.   Endo/Heme/Allergies: Negative.   Psychiatric/Behavioral: Positive for depression and hallucinations. The patient is nervous/anxious.     Blood pressure 136/90, pulse 91, temperature 98.2 F (36.8 C), temperature source Oral, resp. rate 20, SpO2 98 %.There is no weight on file to calculate BMI.  General Appearance: Disheveled  Eye Contact:  Poor  Speech:  Slow  Volume:  Decreased  Mood:  Anxious and Depressed  Affect:  Blunt  Thought Process:  Descriptions of Associations: Intact  Orientation:  Full (Time, Place, and Person)  Thought Content:  Hallucinations: Auditory Visual  Suicidal Thoughts:  No  Homicidal Thoughts:  No  Memory:  Immediate;   Fair Recent;   Fair Remote;   Fair  Judgement:  Impaired  Insight:  Fair  Psychomotor Activity:  Normal  Concentration:  Concentration: Fair and Attention Span: Fair  Recall:  [employer] of Knowledge:  Fair  Language:  Fair  Akathisia:  No  Handed:  Right  AIMS (if indicated):     Assets:  Housing Leisure Time Physical  Health Resilience Social Support  ADL's:  Intact  Cognition:  Impaired,  Mild  Sleep:        Treatment Plan Summary: Daily contact with patient to assess and evaluate symptoms and progress in treatment, Medication management and Plan schizoaffective disorder, bipolar type:  -Crisis stabilization -Medication management:  Increase Latuda 20 mg to 40 mg for psychosis, Discontinue Wellbutrin 150 mg daily for depression, and start Trileptal 300 mg BID for mood stabilization.  Continue Trazodone 50 mg at bedtime for sleep.  Keflex 500 mg TID for UTI started. -Individual counseling  Disposition: Recommend psychiatric Inpatient admission when medically cleared.  Waylan Boga, NP 08/04/2015 10:26 AM Patient seen face-to-face for psychiatric evaluation, chart reviewed and case discussed with the physician extender and developed treatment plan. Reviewed the information documented and agree with the treatment plan. Corena Pilgrim, MD

## 2015-08-04 NOTE — ED Notes (Signed)
Patient appears anxious. Poor eye contact. Reports AH and negative thoughts. Patient will not elaborate on the details of the thoughts or who they are directed at.   Encouragement offered.  Q 15 safety checks in place.

## 2015-08-04 NOTE — ED Notes (Signed)
Pt discharged ambulatory with [place] driver.  All belongings were sent with patient. 

## 2015-08-04 NOTE — Progress Notes (Signed)
Writer attempted to contact the patient in hopes of convincing the pt to return for care. The writer called both numbers [phone] and [phone].  Pt did not answer either of them. No messages were left for the pt. Will attempt to reach later in the shift.

## 2015-08-04 NOTE — ED Notes (Signed)
Patient appears cooperative. Oriented to the unit. [facility].   Encouragement offered. Environment adjusted.   Q 15 safety checks in place.

## 2015-08-05 ENCOUNTER — Encounter (HOSPITAL_COMMUNITY): Payer: Self-pay | Admitting: *Deleted

## 2015-08-05 ENCOUNTER — Inpatient Hospital Stay (HOSPITAL_COMMUNITY)
Admission: AD | Admit: 2015-08-05 | Discharge: 2015-08-28 | DRG: 885 | Disposition: A | Discharge status: Home / Self Care | Payer: BLUE CROSS/BLUE SHIELD | Attending: Psychiatry | Admitting: Psychiatry

## 2015-08-05 DIAGNOSIS — N390 Urinary tract infection, site not specified: Secondary | ICD-10-CM

## 2015-08-05 DIAGNOSIS — Z79899 Other long term (current) drug therapy: Secondary | ICD-10-CM | POA: Diagnosis not present

## 2015-08-05 DIAGNOSIS — F250 Schizoaffective disorder, bipolar type: Secondary | ICD-10-CM | POA: Diagnosis not present

## 2015-08-05 DIAGNOSIS — Z915 Personal history of self-harm: Secondary | ICD-10-CM

## 2015-08-05 DIAGNOSIS — F05 Delirium due to known physiological condition: Secondary | ICD-10-CM | POA: Clinically undetermined

## 2015-08-05 DIAGNOSIS — Z818 Family history of other mental and behavioral disorders: Secondary | ICD-10-CM | POA: Diagnosis not present

## 2015-08-05 LAB — URINE CULTURE: Special Requests: NORMAL

## 2015-08-05 MED ORDER — ZIPRASIDONE HCL 20 MG PO CAPS
20.0000 mg | ORAL_CAPSULE | Freq: Two times a day (BID) | ORAL | Status: DC
  Administered 2015-08-05 – 2015-08-08 (×6): 20 mg via ORAL
  Filled 2015-08-05 (×8): qty 1

## 2015-08-05 MED ORDER — OLANZAPINE 10 MG IM SOLR: 5.0000 mg | Freq: Three times a day (TID) | INTRAMUSCULAR | Status: DC | PRN

## 2015-08-05 MED ORDER — ACETAMINOPHEN 325 MG PO TABS
650.0000 mg | ORAL_TABLET | Freq: Four times a day (QID) | ORAL | Status: DC | PRN
  Administered 2015-08-09: 650 mg via ORAL
  Filled 2015-08-05: qty 2

## 2015-08-05 MED ORDER — NICOTINE 21 MG/24HR TD PT24
21.0000 mg | MEDICATED_PATCH | Freq: Every day | TRANSDERMAL | Status: DC
  Filled 2015-08-05 (×2): qty 1

## 2015-08-05 MED ORDER — OLANZAPINE 5 MG PO TBDP: 5.0000 mg | ORAL_TABLET | Freq: Three times a day (TID) | ORAL | Status: DC | PRN

## 2015-08-05 MED ORDER — TRAZODONE HCL 50 MG PO TABS
50.0000 mg | ORAL_TABLET | Freq: Every day | ORAL | Status: DC
  Administered 2015-08-05 – 2015-08-07 (×3): 50 mg via ORAL
  Filled 2015-08-05 (×5): qty 1

## 2015-08-05 MED ORDER — OXCARBAZEPINE 150 MG PO TABS
150.0000 mg | ORAL_TABLET | Freq: Two times a day (BID) | ORAL | Status: DC
  Administered 2015-08-05 – 2015-08-08 (×6): 150 mg via ORAL
  Filled 2015-08-05 (×8): qty 1

## 2015-08-05 MED ORDER — MAGNESIUM HYDROXIDE 400 MG/5ML PO SUSP
30.0000 mL | Freq: Every day | ORAL | Status: DC | PRN
  Administered 2015-08-12 – 2015-08-27 (×2): 30 mL via ORAL
  Filled 2015-08-05 (×2): qty 30

## 2015-08-05 MED ORDER — CEPHALEXIN 500 MG PO CAPS
500.0000 mg | ORAL_CAPSULE | Freq: Three times a day (TID) | ORAL | Status: DC
  Administered 2015-08-05 – 2015-08-22 (×40): 500 mg via ORAL
  Filled 2015-08-05 (×50): qty 1
  Filled 2015-08-05: qty 2
  Filled 2015-08-05 (×4): qty 1

## 2015-08-05 MED ORDER — HYDROXYZINE HCL 25 MG PO TABS: 25.0000 mg | ORAL_TABLET | Freq: Four times a day (QID) | ORAL | Status: DC | PRN

## 2015-08-05 MED ORDER — ALUM & MAG HYDROXIDE-SIMETH 200-200-20 MG/5ML PO SUSP: 30.0000 mL | ORAL | Status: DC | PRN

## 2015-08-05 NOTE — [facility] Group Notes (Signed)
[facility] LCSW Group Therapy  08/05/2015  1:05 PM  Type of Therapy:  Group therapy  Participation Level:  Active  Participation Quality:  Attentive  Affect:  Flat  Cognitive:  Oriented  Insight:  Limited  Engagement in Therapy:  Limited  Modes of Intervention:  Discussion, Socialization  Summary of Progress/Problems:  Chaplain was here to lead a group on themes of hope and courage. Invited.  Chose to not attend. Jessica [name], Jessica [name] 08/05/2015 1:53 PM

## 2015-08-05 NOTE — BH Assessment (Signed)
Assessment Note  Patient is a 40 year old white female that is a walk in at [facility].  Patient reports that she eloped from the [place] transportation yesterday.  After patient walked away from the [place] transportation she was picked up by a police officer and was taken to her mother's home.   Per the patients mother she allowed her daughter to sleep and then [facility]  Per documentation in the epic chart patient met criteria for inpatient hospitalization.  Patient continues to endorse the same symptoms.   Patient endorses auditory hallucinations.  Patient appeared to be responding to internal stimuli. Patient reports that she is not able to contract for safety.   Patient continues to reports that, "she is not sure", if voices are commanding her to harm herself.  Patient denies HI/Substance Abuse.  Patient denies physical, sexual or emotional abuse.   Documentation from previous assessment on 08/03/2015 is reported below: TYTIANA COLES is a Caucasian 40 y.o. female who presented to [facility] voluntarily as a walk-in.  She complained of depressed mood and auditory/visual hallucination.  Pt was recently discharged from [facility] (last assessed on 07/15/15) where she was treated for Bipolar I.  History collected from Pt and from Pt's mother Deshia Vanderhoof -- [phone]).  Pt reported as follows:    That she has been experiencing auditory/visual hallucinations -- hearing voices, seeing people.  Pt had very poor eye contact, stared at the floor during the entire assessment, rocked back and forth, was very slow in responding to questions, appeared to experience internal cues and thought-blocking, and appeared to author to be preoccupied and anxious.  When asked if Pt heard voices commanding her to harm herself, she paused a long period of time and responded, "I'm not sure."  Pt reported that she is med compliant (takes approx. 100 mg Wellbutrin in AM, and Latuda in evening).  When asked about triggers  for her mood, Pt said she is "stressed out," but could not point to any particular trigger.  Pt said she would like to come inpatient.  Out of Pt's presence, author spoke with Pt's mother.  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  During assessment, Pt's demeanor was very anxious.  She was polite and cooperative, had very poor eye contact, appeared hunched over, and spoke very slowly and after long pauses.  Pt reported mood as depressed and affect was preoccupied.  Pt endorsed despondency, insomnia, fatigue, and self-isolation.  Mother reported that Pt is not tending to self-care.  Pt denied suicidal or homicidal ideation, and she also denied substance use.  Pt stated that she is experiencing auditory and visual hallucinations -- voices of people, visions of people.  She would not confirm if they were commanding her to self-injure or injure others.  Pt denied self-injurious behavior such as cutting, burning, or punching.  Thought processes were slow and consistent with thought-blocking; thought content indicated hallucination.  Memory was grossly intact.  Concentration was very poor.  Author had to repeat questions several times, and Pt would pause and grimace before answering.  Pt's speech was slow and soft.  Judgment and insight were deemed poor.  Impulse control was  deemed fair.  Diagnosis:   Past Medical History:  Past Medical History  Diagnosis Date  . Bipolar 1 disorder ([place])     No past surgical history on file.  Family History:  Family History  Problem Relation Age of Onset  . Hypertension Mother     Social History:  reports that she has never smoked. She does not have any smokeless tobacco  history on file. She reports that she does not drink alcohol or use illicit drugs.  Additional Social History:     CIWA:   COWS:    Allergies:  Allergies  Allergen Reactions  . Prednisone     Prednisone psychosis with anxiety.    Home Medications:  Medications Prior to Admission  Medication Sig Dispense Refill  . cephALEXin (KEFLEX) 500 MG capsule Take 1 capsule (500 mg total) by mouth 2 (two) times daily. 14 capsule 0  . cholecalciferol (VITAMIN D) 1000 units tablet Take 1 tablet (1,000 Units total) by mouth daily. 30 tablet 0  . hydrOXYzine (ATARAX/VISTARIL) 25 MG tablet Take 1 tablet (25 mg total) by mouth every 6 (six) hours as needed for anxiety. 30 tablet 0  . lurasidone (LATUDA) 20 MG TABS tablet Take 1 tablet (20 mg total) by mouth daily after supper. 30 tablet 0  . traZODone (DESYREL) 50 MG tablet Take 1 tablet (50 mg total) by mouth at bedtime as needed for sleep. 30 tablet 0    OB/GYN Status:  No LMP recorded.  General Assessment Data Location of Assessment: [facility] TTS Assessment: In system Is this a Tele or Face-to-Face Assessment?: Face-to-Face Is this an Initial Assessment or a Re-assessment for this encounter?: Initial Assessment Marital status: Single Maiden name: NA Is patient pregnant?: No Pregnancy Status: No Living Arrangements: Parent Can pt return to current living arrangement?: Yes Admission Status: Voluntary Is patient capable of signing voluntary admission?: Yes Referral Source: Self/Family/Friend Insurance type: [place] Exam ([place]) Medical Exam completed: No Reason for MSE not completed: Other:  Crisis Care Plan Living Arrangements: Parent Name of Psychiatrist: Dr. Lita Mains  Name of Therapist: Jessy Oto  Education Status Is patient currently in school?: No Current Grade: NA Highest grade of school patient has completed: Master's Degree Name of school: NA Contact person: NA  Risk to  self with the past 6 months Suicidal Ideation: No Has patient been a risk to self within the past 6 months prior to admission? : Yes Suicidal Intent: No Has patient had any suicidal intent within the past 6 months prior to admission? : No Is patient at risk for suicide?: No Suicidal Plan?: No Has patient had any suicidal plan within the past 6 months prior to admission? : No Access to Means: No What has been your use of drugs/alcohol within the last 12 months?: DENIES Previous Attempts/Gestures: No How many times?: 0 Other Self Harm Risks: NA Triggers for Past Attempts: None known Intentional Self Injurious Behavior: None Family Suicide History: No Recent stressful life event(s): Other (Comment) Persecutory voices/beliefs?: Yes Depression: Yes Depression Symptoms: Despondent, Insomnia, Tearfulness, Feeling worthless/self pity Substance abuse history and/or treatment for substance abuse?: No Suicide prevention information given to non-admitted patients: Not applicable  Risk to Others within the past 6 months Homicidal Ideation: No Does patient have any lifetime risk of violence toward others beyond the six months prior to admission? : No Thoughts of Harm to Others: No Current Homicidal Intent: No Current Homicidal Plan: No Access to Homicidal Means: No Identified  Victim: NA History of harm to others?: No Assessment of Violence: None Noted Violent Behavior Description: Anxious Does patient have access to weapons?: No Criminal Charges Pending?: No Does patient have a court date: No Is patient on probation?: No  Psychosis Hallucinations: Auditory, Visual, With command Delusions: Unspecified  Mental Status Report Appearance/Hygiene: Unremarkable Eye Contact: Poor Motor Activity: Restlessness Speech: Soft, Slow Level of Consciousness: Alert Mood: Depressed, Suspicious, Preoccupied Affect: Depressed, Preoccupied Anxiety Level: Moderate Panic attack frequency: Daily Most  recent panic attack: Yesterday Thought Processes: Thought Blocking Judgement: Impaired Orientation: Person, Place, Time, Situation Obsessive Compulsive Thoughts/Behaviors: None  Cognitive Functioning Concentration: Poor Memory: Recent Intact, Remote Intact IQ: Average Insight: Poor Impulse Control: Fair Appetite: Poor Weight Loss: 0 Weight Gain: 0 Sleep: Decreased Total Hours of Sleep: 3 Vegetative Symptoms: Staying in bed, Decreased grooming  ADLScreening [facility] Assessment Services) Patient's cognitive ability adequate to safely complete daily activities?: Yes Patient able to express need for assistance with ADLs?: Yes Independently performs ADLs?: Yes (appropriate for developmental age)  Prior Inpatient Therapy Prior Inpatient Therapy: Yes Prior Therapy Dates: May 2017 Prior Therapy Facilty/Provider(s): [facility] Reason for Treatment: Bipolar  Prior Outpatient Therapy Prior Outpatient Therapy: Yes Prior Therapy Dates: Ongoing  Prior Therapy Facilty/Provider(s): [facility] Reason for Treatment: Bipolar Does patient have an ACCT team?: No Does patient have Intensive In-House Services?  : No Does patient have [facility] services? : No Does patient have P4CC services?: No  ADL Screening (condition at time of admission) Patient's cognitive ability adequate to safely complete daily activities?: Yes Patient able to express need for assistance with ADLs?: Yes Independently performs ADLs?: Yes (appropriate for developmental age)                  Additional Information 1:1 In Past 12 Months?: No CIRT Risk: No Elopement Risk: No Does patient have medical clearance?: Yes     Disposition: Per Dr. Dwyane Dee - patient meets criteria for inpatient hospitalization.  Per Otila Kluver, NP - patient accepted to [facility]   Disposition Initial Assessment Completed for this Encounter: Yes Disposition of Patient: Inpatient treatment program Type of inpatient treatment program: Adult  On  Site Evaluation by:   Reviewed with Physician:    Graciella Freer [place] 08/05/2015 10:17 AM
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
## 2015-08-05 NOTE — Tx Team (Addendum)
Initial Interdisciplinary Treatment Plan   PATIENT STRESSORS: increased work load   PATIENT STRENGTHS: Ability for insight Average or above average intelligence Capable of independent living Barrister's clerkCommunication skills Motivation for treatment/growth Physical Health Supportive family/friends Work skills   PROBLEM LIST: Problem List/Patient Goals Date to be addressed Date deferred Reason deferred Estimated date of resolution  "I want to be able to manage my stress" Ineffective coping skills 08-05-15     Alteration in Mood (Depression) 08-05-15     Alteration in thought process (Auditory hallucination). 08-05-15     Alteration in sleep pattern 08-05-15                                    DISCHARGE CRITERIA:  Improved stabilization in mood, thinking, and/or behavior Motivation to continue treatment in a less acute level of care Need for constant or close observation no longer present Verbal commitment to aftercare and medication compliance  PRELIMINARY DISCHARGE PLAN: Outpatient therapy Return to previous living arrangement  PATIENT/FAMIILY INVOLVEMENT: This treatment plan has been presented to and reviewed with the patient, Jessica ChaletJennifer L [name].  The patient have been given the opportunity to ask questions and make suggestions.  Jessica MangesWesseh, Jessica [name] 08/05/2015, 6:11 PM

## 2015-08-05 NOTE — Progress Notes (Signed)
Pt is a 40 y/o caucasian [facility] with c/o depression and auditory hallucinations at this time. Per chart, pt does have a h/o Bipolar 1 disorder. Pt presents anxious with blunt affect on initial contact. Denies SI, HI, VH and pain when assessed. Endorsed AH not are not commanding in nature. Pt reported intense workload at her job as her main stressor in conjunction with poor sleep (3-4 hrs / night) since she started taking Latuda. [facility]. Pt does live with her mother who is supportive and has no h/o of drinking, smoking or drug use. Pt was cooperative with skin assessment and belonging search done as per protocol. Pt's skin is intact. Hair clasp secured in locker locker. Unit orientation done and routines discussed with pt, understanding verbalized. Pt tolerated meals and fluids well when offered. Q 15 minutes checks maintained for safety on and off unit without self injurious behavior or outburst to note at this time.
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
## 2015-08-05 NOTE — Progress Notes (Signed)
Adult Psychoeducational Group Note  Date:  08/05/2015 Time:  8:37 PM  Group Topic/Focus:  Wrap-Up Group:   The focus of this group is to help patients review their daily goal of treatment and discuss progress on daily workbooks.  Participation Level:  Active  Participation Quality:  Appropriate  Affect:  Appropriate  Cognitive:  Appropriate  Insight: Appropriate  Engagement in Group:  Engaged  Modes of Intervention:  Discussion  Additional Comments: The patient expressed that she did not attend group.The patient also said that she rates today a 7.  Octavio Mannshigpen, Tahsin Benyo Lee 08/05/2015, 8:37 PM

## 2015-08-05 NOTE — [facility] Suicide Risk Assessment (Signed)
[facility]   Nursing information obtained from:    Demographic factors:    Current Mental Status:    Loss Factors:    Historical Factors:    Risk Reduction Factors:     Total Time spent with patient: 30 minutes Principal Problem: Schizoaffective disorder, bipolar type ([facility]) Diagnosis:   Patient Active Problem List   Diagnosis Date Noted  . UTI (lower urinary tract infection) [N39.0]   . Schizoaffective disorder, bipolar type ([facility]) [F25.0] 07/25/2015   Subjective Data: Please see H&P.   Continued Clinical Symptoms:  Alcohol Use Disorder Identification Test Final Score (AUDIT): 0 The "Alcohol Use Disorders Identification Test", Guidelines for Use in Primary Care, Second Edition.  World Science writerHealth Organization [facility](WHO). Score between 0-7:  no or low risk or alcohol related problems. Score between 8-15:  moderate risk of alcohol related problems. Score between 16-19:  high risk of alcohol related problems. Score 20 or above:  warrants further diagnostic evaluation for alcohol dependence and treatment.   CLINICAL FACTORS:   Currently Psychotic Previous Psychiatric Diagnoses and Treatments Medical Diagnoses and Treatments/Surgeries   Musculoskeletal: Strength & Muscle Tone: within normal limits Gait & Station: normal Patient leans: N/A  Psychiatric Specialty Exam: Physical Exam  Vitals reviewed.   Review of Systems  Psychiatric/Behavioral: Positive for hallucinations. The patient is nervous/anxious and has insomnia.   All other systems reviewed and are negative.   Blood pressure 116/78, pulse 103, temperature 98.2 F (36.8 C), temperature source Oral, height 5' 4.02" (1.626 m), weight 90.447 kg (199 lb 6.4 oz), last menstrual period 08/05/2015, SpO2 98 %.Body mass index is 34.21 kg/(m^2).                Please see H&P.                                           COGNITIVE FEATURES THAT CONTRIBUTE TO RISK:   Closed-mindedness, Polarized thinking and Thought constriction (tunnel vision)    SUICIDE RISK:   Moderate:  Frequent suicidal ideation with limited intensity, and duration, some specificity in terms of plans, no associated intent, good self-control, limited dysphoria/symptomatology, some risk factors present, and identifiable protective factors, including available and accessible social support.  PLAN OF CARE: Please see H&P.   I certify that inpatient services furnished can reasonably be expected to improve the patient's condition.   Marylen Zuk, MD 08/05/2015, 11:34 AM

## 2015-08-05 NOTE — Tx Team (Signed)
Interdisciplinary Treatment Plan Update (Adult) Date: 08/05/2015   Date: 08/05/2015 11:42 AM  Progress in Treatment:  Attending groups: No Participating in groups: No Taking medication as prescribed: Yes  Tolerating medication: Yes  Family/Significant other contact made: No,  Patient understands diagnosis: Yes AEB seeking help with depression and hallucinations Discussing patient identified problems/goals with staff: Yes  Medical problems stabilized or resolved: Yes  Denies suicidal/homicidal ideation: Yes Patient has not harmed self or Others: Yes   New problem(s) identified: None identified at this time.   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    Reason for Continuation of Hospitalization:  Anxiety  Depression Medication stabilization Auditory Hallucinations  Estimated length of stay: 4-5 days  Review of initial/current patient goals per problem list:   1.  Goal(s): Patient will participate in aftercare plan  Met:  No  Target date: 3-5 days from date of admission   As evidenced by: Patient will participate within aftercare plan AEB aftercare provider and housing plan at discharge being identified.   07/26/15: Pt will return home and follow-up with outpatient providers   2.  Goal (s): Patient will exhibit decreased depressive symptoms and suicidal ideations.  Met:  No  Target date: 3-5 days from date of admission   As evidenced by: Patient will utilize self rating of depression at 3 or below and demonstrate decreased signs of depression or be deemed stable for  discharge by MD. 08/05/15: Pt was admitted with symptoms of depression, rating 10/10.   3.  Goal(s): Patient will demonstrate decreased signs and symptoms of anxiety.  Met:  No  Target date: 3-5 days from date of admission   As evidenced by: Patient will utilize self rating of anxiety at 3 or below and demonstrated decreased signs of anxiety, or be deemed stable for discharge by MD 08/05/15;  Rates anxiety a 7 today  5.  Goal(s): Patient will demonstrate decreased signs of psychosis  . Met:  No . Target date: 3-5 days from date of admission  . As evidenced by: Patient will demonstrate decreased frequency of AVH or return to baseline function   -08/05/15 Pt endorses auditory hallucinations.  Attendees:  Patient:    Family:    Physician: Ursula Alert  08/05/2015 11:42 AM  Nursing: Manuella Ghazi  08/05/2015 11:42 AM  Clinical Social Worker Rod Trinity 08/05/2015 11:42 AM  Other:  08/05/2015 11:42 AM  Clinical:   08/05/2015 11:42 AM  Other: , RN Charge Nurse 08/05/2015 11:42 AM  Other: Hilda Lias, P4CC

## 2015-08-06 MED ORDER — ENSURE ENLIVE PO LIQD: 237.0000 mL | ORAL | Status: DC

## 2015-08-06 MED ORDER — ENSURE ENLIVE PO LIQD
237.0000 mL | Freq: Every day | ORAL | Status: DC | PRN
  Administered 2015-08-19 – 2015-08-23 (×3): 237 mL via ORAL
  Filled 2015-08-06 (×3): qty 237

## 2015-08-06 NOTE — [facility] Group Notes (Signed)
[facility] Group Notes:  (Clinical Social Work)  08/06/2015  9:30-10:00AM  Summary of Progress/Problems:   Today's process group involved patients discussing their feelings related to being hospitalized, which was then followed by a discussion about anger feelings and how they dealt with the last angry feelings they experienced.  The patient expressed her primary feeling about being hospitalized is that it is easier than when she has been hospitalized in the past, where she has been very frightened.  She feels cared for at this point in her stay.  The most recent time she was angry was when her mother went into her room and started cleaning it, even though she had explicitly told her mother how important her privacy is to her.  The group helped her to brainstorm possible solutions to this issue with her mother.  Type of Therapy:  Group Therapy - Process  Participation Level:  Active  Participation Quality:  Attentive, Sharing and Supportive  Affect:  Appropriate  Cognitive:  Appropriate  Insight:  Engaged  Engagement in Therapy:  Engaged  Modes of Intervention:  Exploration, Discussion  Ambrose MantleMareida Grossman-Orr, LCSW 08/06/2015, 11:41 AM

## 2015-08-06 NOTE — Progress Notes (Signed)
NUTRITION ASSESSMENT  Pt identified as at risk on the Malnutrition Screen Tool  INTERVENTION: 1. Educated patient on the importance of nutrition and encouraged intake of food and beverages. 2. Discussed weight goals. 3. Supplements: none at this time; if pt requires/requests supplement during admission please provide with Ensure Enlive once/day (350 kcal and 20 grams of protein).  NUTRITION DIAGNOSIS: Unintentional weight loss related to sub-optimal intake as evidenced by pt report.   Goal: Pt to meet >/= 90% of their estimated nutrition needs.  Monitor:  PO intake  Assessment:  Pt screened for MST. Pt admitted with auditory hallucinations with hx of bipolar disorder; [facility]. Per review, pt has lost 8 lbs (4% body weight) in the past <2 weeks which is significant for time frame; will order Ensure Enlive once/day PRN should pt need.  40 y.o. female  Height: Ht Readings from Last 1 Encounters:  08/05/15 5' 4.02" (1.626 m)    Weight: Wt Readings from Last 1 Encounters:  08/05/15 199 lb 6.4 oz (90.447 kg)    Weight Hx: Wt Readings from Last 10 Encounters:  08/05/15 199 lb 6.4 oz (90.447 kg)  07/25/15 207 lb (93.895 kg)  03/19/13 209 lb 12.8 oz (95.165 kg)  07/26/12 214 lb (97.07 kg)  06/29/11 199 lb 12.8 oz (90.629 kg)  05/19/11 203 lb (92.08 kg)    BMI:  Body mass index is 34.21 kg/(m^2). Pt meets criteria for obesity based on current BMI.  Estimated Nutritional Needs: Kcal: 25-30 kcal/kg Protein: > 1 gram protein/kg Fluid: 1 ml/kcal  Diet Order: Diet heart healthy/carb modified Room service appropriate?: Yes; Fluid consistency:: Thin Pt is also offered choice of unit snacks mid-morning and mid-afternoon.  Pt is eating as desired.   Lab results and medications reviewed.      Trenton GammonJessica Clayvon Parlett, RD, LDN Inpatient Clinical Dietitian Pager # [phone] After hours/weekend pager # [phone]

## 2015-08-06 NOTE — Progress Notes (Signed)
[facility]  08/06/2015 2:04 PM Jessica ChaletJennifer L [name]  MRN:  [id number]   Subjective:  "Im good. I am drowsy and sleepy because my roommate snores. Got to sleep just fine but when I wake up its hard to go back because of the snoring. "  Objective: Pt seen and chart reviewed. Pt is alert/oriented x4, calm, cooperative, and appropriate to situation. Pt denies suicidal/homicidal ideation and psychosis and does not appear to be responding to internal stimuli. Pt reports that her sleep improved overall but was rough last night due to her roommate snoring. Pt reports that she feels her medications are working well and that she would like to continue as ordered at present. She is attending groups and reports her goal today is to work on Optician, dispensingstress management. She also notes that her appetite is improving since she got here. She denies depression and anxiety symptoms at this time.   Principal Problem: Delirium due to another medical condition Diagnosis:   Patient Active Problem List   Diagnosis Date Noted  . Delirium due to another medical condition [F05] 08/05/2015  . UTI (lower urinary tract infection) [N39.0]   . Schizoaffective disorder, bipolar type (HCC) [F25.0] 07/25/2015   Total Time spent with patient: 15 minutes  Past Medical History:  Past Medical History  Diagnosis Date  . Bipolar 1 disorder (HCC)    History reviewed. No pertinent past surgical history. Family History:  Family History  Problem Relation Age of Onset  . Hypertension Mother   . Mental illness Father    Social History:  History  Alcohol Use No     History  Drug Use No    Social History   Social History  . Marital Status: Single    Spouse Name: N/A  . Number of Children: N/A  . Years of Education: N/A   Social History Main Topics  . Smoking status: Never Smoker   . Smokeless tobacco: None  . Alcohol Use: No  . Drug Use: No  . Sexual Activity: No   Other Topics Concern  . None   Social History  Narrative   Additional Social History:   Sleep: Good  Appetite:  Good  Current Medications: Current Facility-Administered Medications  Medication Dose Route Frequency Provider Last Rate Last Dose  . acetaminophen (TYLENOL) tablet 650 mg  650 mg Oral Q6H PRN Sanjuana KavaAgnes I Nwoko, NP      . alum & mag hydroxide-simeth (MAALOX/MYLANTA) 200-200-20 MG/5ML suspension 30 mL  30 mL Oral Q4H PRN Sanjuana KavaAgnes I Nwoko, NP      . cephALEXin (KEFLEX) capsule 500 mg  500 mg Oral Q8H Saramma Eappen, MD   500 mg at 08/06/15 1355  . feeding supplement (ENSURE ENLIVE) (ENSURE ENLIVE) liquid 237 mL  237 mL Oral Daily PRN [name] LongsSaramma Eappen, MD      . hydrOXYzine (ATARAX/VISTARIL) tablet 25 mg  25 mg Oral Q6H PRN Saramma Eappen, MD      . magnesium hydroxide (MILK OF MAGNESIA) suspension 30 mL  30 mL Oral Daily PRN Sanjuana KavaAgnes I Nwoko, NP      . OLANZapine zydis (ZYPREXA) disintegrating tablet 5 mg  5 mg Oral TID PRN [name] LongsSaramma Eappen, MD       Or  . OLANZapine (ZYPREXA) injection 5 mg  5 mg Intramuscular TID PRN [name] LongsSaramma Eappen, MD      . OXcarbazepine (TRILEPTAL) tablet 150 mg  150 mg Oral BID [name] LongsSaramma Eappen, MD   150 mg at 08/06/15 0748  . traZODone (DESYREL)  tablet 50 mg  50 mg Oral QHS Sanjuana Kava, NP   50 mg at 08/05/15 2152  . ziprasidone (GEODON) capsule 20 mg  20 mg Oral BID WC [name] Longs, MD   20 mg at 08/06/15 0748    Lab Results:  No results found for this or any previous visit (from the past 48 hour(s)).  Blood Alcohol level:  Lab Results  Component Value Date   ETH <5 08/03/2015   ETH <5 07/25/2015    Physical Findings: AIMS: Facial and Oral Movements Muscles of Facial Expression: None, normal Lips and Perioral Area: None, normal Jaw: None, normal Tongue: None, normal,Extremity Movements Upper (arms, wrists, hands, fingers): None, normal Lower (legs, knees, ankles, toes): None, normal, Trunk Movements Neck, shoulders, hips: None, normal, Overall Severity Severity of abnormal movements (highest score  from questions above): None, normal Incapacitation due to abnormal movements: None, normal Patient's awareness of abnormal movements (rate only patient's report): No Awareness, Dental Status Current problems with teeth and/or dentures?: No Does patient usually wear dentures?: No  CIWA:  CIWA-Ar Total: 0 COWS:  COWS Total Score: 0  Musculoskeletal: Strength & Muscle Tone: within normal limits Gait & Station: normal Patient leans: N/A  Psychiatric Specialty Exam: Physical Exam   Review of Systems  Psychiatric/Behavioral: Positive for depression. Negative for suicidal ideas and hallucinations. The patient is nervous/anxious. The patient does not have insomnia.   All other systems reviewed and are negative.  denies chest pain, no shortness of breath, no vomiting, no rash , at this time denies dysuria, denies frequency, no fever or chills  Blood pressure 117/78, pulse 101, temperature 97.6 F (36.4 C), temperature source Oral, resp. rate 16, height 5' 4.02" (1.626 m), weight 90.447 kg (199 lb 6.4 oz), last menstrual period 08/05/2015, SpO2 98 %.Body mass index is 34.21 kg/(m^2).  General Appearance: Casual and Well Groomed  Eye Contact:  Good  Speech:  Clear and Coherent and Normal Rate  Volume:  Normal  Mood:  Euthymic  Affect:  Appropriate, Congruent and more reactive   Thought Process:  Linear  Orientation:  Full (Time, Place, and Person)  Thought Content:  Symptoms worries concerns  Suicidal Thoughts:  No denies any suicidal ideations, denies any self injurious ideations ; able to contract for safety  Homicidal Thoughts:  No  Memory:  recent and remote grossly intact   Judgement:  Fair yet improving  Insight:  Good  Psychomotor Activity:  Normal  Concentration:  Concentration: Good and Attention Span: Good  Recall:  Good  Fund of Knowledge:  Good  Language:  Good  Akathisia:  Negative  Handed:  Right  AIMS (if indicated):   no akathisia, no abnormal involuntary movements    Assets:  Desire for Improvement Resilience  ADL's:  Intact  Cognition:  WNL  Sleep:  Number of Hours: 6.75   Treatment Plan Summary: Delirium due to another medical condition, improving, treated as below on 08/06/2015   Daily contact with patient to assess and evaluate symptoms and progress in treatment, Medication management, Plan inpatient admission  and medications as below   Continue to encourage groups, milieu to work on coping skills and symptom reduction Continue Trazodone 50 mgrs QHS PRN for insomnia  D/C Wellbutrin XL 150 mgrs QAM for depression Change Geodon to 20mg  po BID for depression, mood disorder  Continue Vistaril 25 mgrs Q 6 hours PRN for anxiety Will continue Trileptal 150mg  po BID for mood disorder.  Treatment team working on discharge planning  Truman Hayward, FNP 08/06/2015, 2:04 PM  Reviewed the information documented and agree with the treatment plan.  Jessica [name] 08/08/2015 1:47 PM

## 2015-08-06 NOTE — Plan of Care (Signed)
Problem: Safety: Goal: Ability to identify and utilize support systems that promote safety will improve Outcome: Progressing Pt denies SI/HI. Pt encouraged to attend group and engage in milieu

## 2015-08-06 NOTE — [facility] Group Notes (Signed)
Adult Psychoeducational Group Note  Date:  08/06/2015 Time:  8:59 PM  Group Topic/Focus:  Wrap-Up Group:   The focus of this group is to help patients review their daily goal of treatment and discuss progress on daily workbooks.  Participation Level:  Active  Participation Quality:  Appropriate  Affect:  Appropriate  Cognitive:  Appropriate  Insight: Good  Engagement in Group:  Engaged  Modes of Intervention:  Discussion  Additional Comments:  Pt rated her day a 6.  Pt stated she went to groups for the first time and went outside.  Her goal was to work on Optician, dispensingstress management.  Caroll RancherLindsay, Devin Ganaway A 08/06/2015, 8:59 PM

## 2015-08-06 NOTE — Progress Notes (Signed)
Patient ID: Jessica [name], female   DOB: 02/07/1976, 40 y.o.   MRN: [id number] D: Patient endorses AH without command. Pt staed "the the voices ares not bad than today in the car".  Pt denies SI/HI/VH and pain. Pt reports goal in to learning coping skills to better deal with her job. Mood and affect appeared anxious. No behavioral issues noted.  A: Support and encouragement offered as needed. Medications administered as prescribed.  R: Patient cooperative and safe on unit. Will continue to monitor patient for safety and stability.

## 2015-08-06 NOTE — Progress Notes (Signed)
D-  Patient denies SI, HI and AVH this shift.  Patient stated that she needs to have a medications adjusted.  Patient has had been visible on the milieu and has had no behavioral dyscontrol this shift.   A- Assess patient for safety, offer medications for safety, engage patient in 1:1 staff talks   R-  Continue to monitor patient for safety.

## 2015-08-07 NOTE — Progress Notes (Signed)
[facility] Group Notes:  (Nursing/MHT/Case Management/Adjunct)  Date:  08/07/2015  Time:  9:32 PM  Type of Therapy:  Psychoeducational Skills  Participation Level:  Active  Participation Quality:  Appropriate  Affect:  Appropriate  Cognitive:  Appropriate  Insight:  Appropriate  Engagement in Group:  Developing/Improving  Modes of Intervention:  Education  Summary of Progress/Problems: The patient had little to share in group but was appropriate.   Hazle CocaGOODMAN, Maleeka Sabatino S 08/07/2015, 9:32 PM

## 2015-08-07 NOTE — Progress Notes (Signed)
D: Patient is alert and oriented. Pt's mood and affect are pleasant and blunted. Pt denies SI/HI and AVH. Pt rates depression 2/10, hopelessness 1/10, and anxiety 2/10. Pt reports her goal for the day is "to try to feel stronger inside." Pt currently on treatment for UTI, denies symptoms. Pt is attending unit groups. A: Active listening by RN. Encouragement/Support provided to pt. Scheduled medications administered per providers orders (See MAR). 15 minute checks continued per protocol for patient safety.  R: Patient cooperative and receptive to nursing interventions. Pt remains safe.

## 2015-08-07 NOTE — Plan of Care (Signed)
Problem: Activity: Goal: Interest or engagement in activities will improve Outcome: Progressing Patient is engaged during groups and participates.  Problem: Coping: Goal: Ability to verbalize feelings will improve Outcome: Progressing Patient reports in RN group that she plans to verbalize her emotions more in groups.  Problem: Self-Concept: Goal: Level of anxiety will decrease Outcome: Progressing Patient reports low levels of anxiety today.

## 2015-08-07 NOTE — Progress Notes (Signed)
[facility]  08/07/2015 10:23 AM Jessica ChaletJennifer L [name]  MRN:  [id number]   Subjective:  "Im doing all right. Taking it hour by hour and day day baby steps. It is second trip in two weeks, I thought I was ready last time. But things started going downhill too fast last time. I had goals and everything in place, stress management and I was going outside walking and nothing helped. 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 I did get better but it took awhile."  Objective: Pt seen and chart reviewed. Pt is alert/oriented x4, calm, cooperative, and appropriate to situation. Pt denies suicidal/homicidal ideation and psychosis and does not appear to be responding to internal stimuli. Pt reports that her sleep improved, she was given earplugs to help sleep.  Pt reports that she feels her medications are working well and that she would like to continue as ordered at present. She is attending groups and reports her goal today is to be a stronger woman. " I been working with Fenton MallingMichelle Gallimore at [facility], helping my faith and moving stronger.  She also notes that her appetite is fair, but continues to improve since she got here. She denies depression and anxiety symptoms at this time. Pt is observed staring at the floor, doesn't know why. At the start of the evaluation she was making good eye contact, but unable to identify the reason for her acute changes. Will continue to monitor.   Principal Problem: Delirium due to another medical condition Diagnosis:   Patient Active Problem List   Diagnosis Date Noted  . Delirium due to another medical condition [F05] 08/05/2015  . UTI (lower urinary tract infection) [N39.0]   . Schizoaffective disorder, bipolar type (HCC) [F25.0] 07/25/2015   Total Time spent with patient: 15 minutes  Past Medical History:  Past Medical History  Diagnosis Date  . Bipolar 1 disorder (HCC)    History reviewed.  No pertinent past surgical history. Family History:  Family History  Problem Relation Age of Onset  . Hypertension Mother   . Mental illness Father    Social History:  History  Alcohol Use No     History  Drug Use No    Social History   Social History  . Marital Status: Single    Spouse Name: N/A  . Number of Children: N/A  . Years of Education: N/A   Social History Main Topics  . Smoking status: Never Smoker   . Smokeless tobacco: None  . Alcohol Use: No  . Drug Use: No  . Sexual Activity: No   Other Topics Concern  . None   Social History Narrative   Additional Social History:   Sleep: Good  Appetite:  Good  Current Medications: Current Facility-Administered Medications  Medication Dose Route Frequency Provider Last Rate Last Dose  . acetaminophen (TYLENOL) tablet 650 mg  650 mg Oral Q6H PRN Sanjuana KavaAgnes I Nwoko, NP      . alum & mag hydroxide-simeth (MAALOX/MYLANTA) 200-200-20 MG/5ML suspension 30 mL  30 mL Oral Q4H PRN Sanjuana KavaAgnes I Nwoko, NP      . cephALEXin (KEFLEX) capsule 500 mg  500 mg Oral Q8H Saramma Eappen, MD   500 mg at 08/07/15 0629  . feeding supplement (ENSURE ENLIVE) (ENSURE ENLIVE) liquid 237 mL  237 mL Oral Daily PRN [name] LongsSaramma Eappen, MD      . hydrOXYzine (ATARAX/VISTARIL) tablet 25 mg  25 mg Oral Q6H PRN  [name] Longs, MD      . magnesium hydroxide (MILK OF MAGNESIA) suspension 30 mL  30 mL Oral Daily PRN Sanjuana Kava, NP      . OLANZapine zydis (ZYPREXA) disintegrating tablet 5 mg  5 mg Oral TID PRN [name] Longs, MD       Or  . OLANZapine (ZYPREXA) injection 5 mg  5 mg Intramuscular TID PRN [name] Longs, MD      . OXcarbazepine (TRILEPTAL) tablet 150 mg  150 mg Oral BID [name] Longs, MD   150 mg at 08/07/15 0825  . traZODone (DESYREL) tablet 50 mg  50 mg Oral QHS Sanjuana Kava, NP   50 mg at 08/06/15 2116  . ziprasidone (GEODON) capsule 20 mg  20 mg Oral BID WC [name] Longs, MD   20 mg at 08/07/15 0825    Lab Results:  No results found for  this or any previous visit (from the past 48 hour(s)).  Blood Alcohol level:  Lab Results  Component Value Date   ETH <5 08/03/2015   ETH <5 07/25/2015    Physical Findings: AIMS: Facial and Oral Movements Muscles of Facial Expression: None, normal Lips and Perioral Area: None, normal Jaw: None, normal Tongue: None, normal,Extremity Movements Upper (arms, wrists, hands, fingers): None, normal Lower (legs, knees, ankles, toes): None, normal, Trunk Movements Neck, shoulders, hips: None, normal, Overall Severity Severity of abnormal movements (highest score from questions above): None, normal Incapacitation due to abnormal movements: None, normal Patient's awareness of abnormal movements (rate only patient's report): No Awareness, Dental Status Current problems with teeth and/or dentures?: No Does patient usually wear dentures?: No  CIWA:  CIWA-Ar Total: 0 COWS:  COWS Total Score: 0  Musculoskeletal: Strength & Muscle Tone: within normal limits Gait & Station: normal Patient leans: N/A  Psychiatric Specialty Exam: Physical Exam   Review of Systems  Psychiatric/Behavioral: Positive for depression. Negative for suicidal ideas and hallucinations. The patient is nervous/anxious. The patient does not have insomnia.   All other systems reviewed and are negative.  denies chest pain, no shortness of breath, no vomiting, no rash , at this time denies dysuria, denies frequency, no fever or chills  Blood pressure 115/71, pulse 88, temperature 98.4 F (36.9 C), temperature source Oral, resp. rate 14, height 5' 4.02" (1.626 m), weight 90.447 kg (199 lb 6.4 oz), last menstrual period 08/05/2015, SpO2 98 %.Body mass index is 34.21 kg/(m^2).  General Appearance: Casual and Well Groomed  Eye Contact:  Good  Speech:  Clear and Coherent and Normal Rate  Volume:  Normal  Mood:  Depressed  Affect:  Appropriate, Congruent and more reactive   Thought Process:  Linear  Orientation:  Full (Time,  Place, and Person)  Thought Content:  Symptoms worries concerns  Suicidal Thoughts:  No denies any suicidal ideations, denies any self injurious ideations ; able to contract for safety  Homicidal Thoughts:  No  Memory:  recent and remote grossly intact   Judgement:  Fair yet improving  Insight:  Good  Psychomotor Activity:  Normal  Concentration:  Concentration: Good and Attention Span: Good  Recall:  Good  Fund of Knowledge:  Good  Language:  Good  Akathisia:  Negative  Handed:  Right  AIMS (if indicated):   no akathisia, no abnormal involuntary movements   Assets:  Desire for Improvement Resilience  ADL's:  Intact  Cognition:  WNL  Sleep:  Number of Hours: 6.25   Treatment Plan Summary: Delirium due to another medical  condition, improving, treated as below on 08/07/2015   Daily contact with patient to assess and evaluate symptoms and progress in treatment, Medication management, Plan inpatient admission  and medications as below  Continue to encourage groups, milieu to work on coping skills and symptom reduction Continue Trazodone 50 mgrs QHS PRN for insomnia  D/C Wellbutrin XL 150 mgrs QAM for depression Change Geodon to  po BID for depression, mood disorder  Continue Vistaril 25 mgrs Q 6 hours PRN for anxiety Will continue Trileptal  po BID for mood disorder.  Treatment team working on discharge planning    Truman Hayward, FNP 08/07/2015, 10:23 AM  Reviewed the information documented and agree with the treatment plan.  Alick Lecomte 08/08/2015 1:48 PM

## 2015-08-07 NOTE — [facility] Group Notes (Signed)
[facility] Group Notes:  (Clinical Social Work)  08/07/2015  11:00AM-12:00PM  Summary of Progress/Problems:  The main focus of today's process group was to listen to a variety of genres of music and to identify that different types of music provoke different responses.  The patient then was able to identify personally what was soothing for them, as well as energizing, as well as how patient can personally use this knowledge in sleep habits, with depression, and with other symptoms.  The patient expressed at the beginning of group the overall feeling of "somber" and at the end stated she felt better and liked using music in this manner.  Type of Therapy:  Music Therapy   Participation Level:  Active  Participation Quality:  Attentive and Sharing  Affect:  Blunted  Cognitive:  Oriented  Insight:  Engaged  Engagement in Therapy:  Engaged  Modes of Intervention:   Activity, Exploration  Ambrose MantleMareida Grossman-Orr, LCSW 08/07/2015

## 2015-08-07 NOTE — Progress Notes (Signed)
Patient ID: Jessica [name], female   DOB: 11/06/1975, 40 y.o.   MRN: [id number] D: Patient appeared calm and cooperative sitting in dayroom interacting with peers. Pt reports tolerating medication well. Pt denies SI/HI/AVH and pain. Pt attended and engaged in evening wrap up group.   A: Support and encouragement offered as needed. Medications administered as prescribed.  R: Patient remain safe and compliant with medications.

## 2015-08-07 NOTE — Progress Notes (Addendum)
D:Patient in the dayroom on approach.  Patient states she had a good day.  Patient rates her depression and anxiety both 2/10.  Patient states she has been eating well.  Patient states her goal for today was to be a stronger woman.  Patient states she met her goal by thinking of times she has been strong such as when she played sports in the past.  Patient denies SI/HI and denies AVH. A: Staff to monitor Q 15 mins for safety.  Encouragement and support offered.  Scheduled medications administered per orders. R: Patient remains safe on the unit.  Patient attended group tonight.  Patient visible on the unit.  Patient taking administered medications.

## 2015-08-07 NOTE — [facility] Group Notes (Signed)
[facility] Group Notes:  (Nursing/MHT/Case Management/Adjunct)  Date:  08/07/2015  Time:  0930am  Type of Therapy:  Nurse Education  Participation Level:  Active  Participation Quality:  Appropriate and Attentive  Affect:  Appropriate  Cognitive:  Alert and Appropriate  Insight:  Appropriate and Good  Engagement in Group:  Engaged  Modes of Intervention:  Discussion, Education and Socialization  Summary of Progress/Problems: Patient attended group, remained engaged, and responded appropriately when prompted.  Jonathon BellowsBrittany G Jammy Plotkin 08/07/2015, 10:38 AM

## 2015-08-08 MED ORDER — OXCARBAZEPINE 300 MG PO TABS
300.0000 mg | ORAL_TABLET | Freq: Two times a day (BID) | ORAL | Status: DC
  Administered 2015-08-08 – 2015-08-15 (×13): 300 mg via ORAL
  Filled 2015-08-08 (×20): qty 1

## 2015-08-08 MED ORDER — ZIPRASIDONE HCL 40 MG PO CAPS
40.0000 mg | ORAL_CAPSULE | Freq: Two times a day (BID) | ORAL | Status: DC
  Administered 2015-08-08 – 2015-08-09 (×2): 40 mg via ORAL
  Filled 2015-08-08 (×4): qty 1

## 2015-08-08 MED ORDER — TRAZODONE HCL 100 MG PO TABS
100.0000 mg | ORAL_TABLET | Freq: Every day | ORAL | Status: DC
  Administered 2015-08-08: 100 mg via ORAL
  Filled 2015-08-08 (×3): qty 1

## 2015-08-08 NOTE — Progress Notes (Signed)
Did not attend group 

## 2015-08-08 NOTE — Progress Notes (Signed)
Recreation Therapy Notes  06.12.2017 approximately 3:05pm. Per MD order LRT met with patient to investigate ways to enhance tx during admission. Patient in bed sleeping when LRT arrived to unit. LRT called patient name, patient did not respond. LRT to continue to attempt to assess during admission.   Laureen Ochs Tahjai Schetter, LRT/CTRS   Zikeria Keough L 08/08/2015 4:11 PM

## 2015-08-08 NOTE — Plan of Care (Signed)
Problem: Education: Goal: Mental status will improve Outcome: Not Progressing Patient endorsing AH.  Problem: Medication: Goal: Compliance with prescribed medication regimen will improve Outcome: Progressing Patient is med compliant.

## 2015-08-08 NOTE — Progress Notes (Addendum)
D:Patient in her room on approach.  Patient appears to be hearing voices.  Patient appears frightened.  Patient states she cannot make out what the voices are saying. Patient does not engage in conversation with Clinical research associatewriter Patient denies SI/HI.  Patient verbally contracts for safety. A: Staff to monitor Q 15 mins for safety.  Encouragement and support offered.  Scheduled medications administered per orders. R: Patient remains safe on the unit.  Patient did not attend group tonight.  Patient only visible on the unit for something to drink tonight.  Patient taking administered medications.

## 2015-08-08 NOTE — [facility] Group Notes (Signed)
[facility] LCSW Group Therapy  08/08/2015 1:15 pm  Type of Therapy: Process Group Therapy  Participation Level:  Active  Participation Quality:  Appropriate  Affect:  Flat  Cognitive:  Oriented  Insight:  Improving  Engagement in Group:  Limited  Engagement in Therapy:  Limited  Modes of Intervention:  Activity, Clarification, Education, Problem-solving and Support  Summary of Progress/Problems: Today's group addressed the issue of overcoming obstacles.  Patients were asked to identify their biggest obstacle post d/c that stands in the way of their on-going success, and then problem solve as to how to manage this.  Invited.  In bed asleep.  Jessica [name], Jessica [name] B 08/08/2015   3:32 PM

## 2015-08-08 NOTE — Progress Notes (Signed)
Patient extremely withdrawn this AM, complains of AH however does not expand as to what the voices are saying. Avoids all contact. Apprehensive in interactions. Forwards minimal information and displays thought blocking. Rates her depression at a 1/10, hopelessness and anxiety both at a 0/10 however incongruent with presentation. Denies VH. No pain, physical complaints. Patient offered emotional support and reassurance. Medicated per orders. Self inventory reviewed. Will continue to encourage participation, interaction. She remains safe on level III obs.

## 2015-08-08 NOTE — Progress Notes (Signed)
[facility]  08/08/2015 1:37 PM Jessica [name]  MRN:  [id number] Subjective:  "I am not doing so well. I did not sleep last night and I am hearing voices.'   Objective:Jessica [name] is a Caucasian 40 y.o. female , who is single , employed , lives with her mother in [place] , who has a hx of schizoaffective do , as well as a current diagnosis of UTI , [facility] . However pt later on eloped from [place] - was in the woods confused and was found by police officers and returned to ED.  Pt seen and chart reviewed.  Pt is alert/oriented x4,however appears to be anxious , with closed eyes , avoids eye contact - appears paranoid and tearful. Pt reports AH as increased today and reports feeling sad and restless. Pt also reports sleep issues. Per staff - pt started feeling anxious since last night - unknown what could have triggered this. Has been compliant on medications - denies ADRs.      Principal Problem: Schizoaffective disorder, bipolar type (HCC) superimposed by possible delirium 2/2 UTI Diagnosis:   Patient Active Problem List   Diagnosis Date Noted  . Delirium due to another medical condition [F05] 08/05/2015  . UTI (lower urinary tract infection) [N39.0]   . Schizoaffective disorder, bipolar type (HCC) [F25.0] 07/25/2015   Total Time spent with patient: 25 minutes  Past Medical History: Please see H&P.  Past Medical History  Diagnosis Date  . Bipolar 1 disorder (HCC)    History reviewed. No pertinent past surgical history. Family History:  Family History  Problem Relation Age of Onset  . Hypertension Mother   . Mental illness Father    Social History: Please see H&P.  History  Alcohol Use No     History  Drug Use No    Social History   Social History  . Marital Status: Single    Spouse Name: N/A  . Number of Children: N/A  . Years of Education: N/A   Social History Main Topics  . Smoking status: Never  Smoker   . Smokeless tobacco: None  . Alcohol Use: No  . Drug Use: No  . Sexual Activity: No   Other Topics Concern  . None   Social History Narrative   Additional Social History:   Sleep: Poor  Appetite:  Fair  Current Medications: Current Facility-Administered Medications  Medication Dose Route Frequency Provider Last Rate Last Dose  . acetaminophen (TYLENOL) tablet 650 mg  650 mg Oral Q6H PRN Sanjuana KavaAgnes I Nwoko, NP      . alum & mag hydroxide-simeth (MAALOX/MYLANTA) 200-200-20 MG/5ML suspension 30 mL  30 mL Oral Q4H PRN Sanjuana KavaAgnes I Nwoko, NP      . cephALEXin (KEFLEX) capsule 500 mg  500 mg Oral Q8H Dempsey Knotek, MD   500 mg at 08/08/15 0754  . feeding supplement (ENSURE ENLIVE) (ENSURE ENLIVE) liquid 237 mL  237 mL Oral Daily PRN [name] LongsSaramma Yuliet Needs, MD      . hydrOXYzine (ATARAX/VISTARIL) tablet 25 mg  25 mg Oral Q6H PRN Kerri Asche, MD      . magnesium hydroxide (MILK OF MAGNESIA) suspension 30 mL  30 mL Oral Daily PRN Sanjuana KavaAgnes I Nwoko, NP      . OLANZapine zydis (ZYPREXA) disintegrating tablet 5 mg  5 mg Oral TID PRN [name] LongsSaramma Jazyiah Yiu, MD       Or  . OLANZapine (ZYPREXA) injection 5 mg  5 mg  Intramuscular TID PRN [name] Longs, MD      . Oxcarbazepine (TRILEPTAL) tablet 300 mg  300 mg Oral BID [name] Longs, MD      . traZODone (DESYREL) tablet 100 mg  100 mg Oral QHS Quenton Recendez, MD      . ziprasidone (GEODON) capsule 40 mg  40 mg Oral BID WC [name] Longs, MD        Lab Results:  No results found for this or any previous visit (from the past 48 hour(s)).  Blood Alcohol level:  Lab Results  Component Value Date   ETH <5 08/03/2015   ETH <5 07/25/2015    Physical Findings: AIMS: Facial and Oral Movements Muscles of Facial Expression: None, normal Lips and Perioral Area: None, normal Jaw: None, normal Tongue: None, normal,Extremity Movements Upper (arms, wrists, hands, fingers): None, normal Lower (legs, knees, ankles, toes): None, normal, Trunk Movements Neck,  shoulders, hips: None, normal, Overall Severity Severity of abnormal movements (highest score from questions above): None, normal Incapacitation due to abnormal movements: None, normal Patient's awareness of abnormal movements (rate only patient's report): No Awareness, Dental Status Current problems with teeth and/or dentures?: No Does patient usually wear dentures?: No  CIWA:  CIWA-Ar Total: 0 COWS:  COWS Total Score: 0  Musculoskeletal: Strength & Muscle Tone: within normal limits Gait & Station: normal Patient leans: N/A  Psychiatric Specialty Exam: Physical Exam  Review of Systems  Psychiatric/Behavioral: Positive for depression and hallucinations. Negative for suicidal ideas. The patient is nervous/anxious and has insomnia.   All other systems reviewed and are negative.  denies chest pain, no shortness of breath, no vomiting, no rash , at this time denies dysuria, denies frequency, no fever or chills  Blood pressure 124/87, pulse 97, temperature 98.4 F (36.9 C), temperature source Oral, resp. rate 18, height 5' 4.02" (1.626 m), weight 90.447 kg (199 lb 6.4 oz), last menstrual period 08/05/2015, SpO2 98 %.Body mass index is 34.21 kg/(m^2).  General Appearance: Guarded  Eye Contact:  None  Speech:  Slow  Volume:  Normal  Mood:  Anxious and Depressed  Affect:  Labile and Tearful  Thought Process:  Linear and Descriptions of Associations: Intact  Orientation:  Full (Time, Place, and Person)  Thought Content:  Rumination, AH - loud - does not elaborate  Suicidal Thoughts:  No denies any suicidal ideations, denies any self injurious ideations ; able to contract for safety  Homicidal Thoughts:  No  Memory:  recent and remote grossly intact immediate - fair  Judgement:  Fair yet improving  Insight:  Good  Psychomotor Activity:  Restlessness  Concentration:  Concentration: Fair and Attention Span: Fair  Recall:  Good  Fund of Knowledge:  Good  Language:  Good  Akathisia:   Negative  Handed:  Right  AIMS (if indicated):   no akathisia, no abnormal involuntary movements   Assets:  Desire for Improvement Resilience  ADL's:  Intact  Cognition:  WNL  Sleep:  Number of Hours: 6.75   Treatment Plan Summary:Shyia L Yerian is a Caucasian 40 y.o. female , who is single , employed , lives with her mother in [place] , who has a hx of schizoaffective do , as well as a current diagnosis of UTI , who presented to [facility] voluntarily as a walk-in initially . However pt later on eloped from [place] - was in the woods confused and was found by police officers and returned to ED. Pt was found to have  UTI started on antibiotics.  Pt reports several such episodes of confusion in the past when she has OD sed on medications and does not remember why. Pt today is labile , tearful and psychotic. Will continue treatment.   Daily contact with patient to assess and evaluate symptoms and progress in treatment, Medication management, Plan inpatient admission  and medications as below  Will increase Geodon to 40 mg po BID for psychosis - titrate higher slowly. EKG for qtc - wnl  Will increase Trileptal to 300 mg po bid for mood sx. Trileptal was initiated in the ED. Will increase Trazodone to 100 mg po qhs for sleep. Continue Keflex 500 mg po tid for UTI. Will make available prn medications as per agitation protocol. Will continue to monitor vitals ,medication compliance and treatment side effects while patient is here.  Will monitor for medical issues as well as call consult as needed.  CSW will continue working on disposition.  Recreational therapy consult. Patient to participate in therapeutic milieu    Trebor Galdamez, MD 08/08/2015, 1:37 PM

## 2015-08-09 MED ORDER — TRAZODONE HCL 100 MG PO TABS
125.0000 mg | ORAL_TABLET | Freq: Every day | ORAL | Status: DC
  Administered 2015-08-09 – 2015-08-14 (×5): 125 mg via ORAL
  Filled 2015-08-09 (×9): qty 1

## 2015-08-09 MED ORDER — ZIPRASIDONE HCL 60 MG PO CAPS
60.0000 mg | ORAL_CAPSULE | Freq: Two times a day (BID) | ORAL | Status: DC
  Administered 2015-08-10 – 2015-08-15 (×11): 60 mg via ORAL
  Filled 2015-08-09 (×18): qty 1

## 2015-08-09 NOTE — Progress Notes (Signed)
[facility] MD Progress Note  08/09/2015 8:39 AM Jessica [name]  MRN:  [id number] Subjective:  "I had a psychotic episode last night. I started hearing voices."    Objective:Jessica [name] is a Caucasian 40 y.o. female , who is single , employed , lives with her mother in [place] , who has a hx of schizoaffective do , as well as a current diagnosis of UTI , who presented to [facility] voluntarily as a walk-in initially . However pt later on eloped from [place] - was in the woods confused and was found by police officers and returned to ED.  Pt seen and chart reviewed.  Pt is alert/oriented x4, however is anxious , tearful , speech is appropriate, but volume is soft . Pt reports having an episode last night when she was having AH - which she could not elaborate . However pt started crying when she started talking about it. Pt seems to be distressed by these voices. Pt also reports sleep as disrupted due to her having AH. Per staff - pt continues to be labile and psychotic. Will continue to need medications and support.    Principal Problem: Schizoaffective disorder, bipolar type (HCC) superimposed by possible delirium 2/2 UTI Diagnosis:   Patient Active Problem List   Diagnosis Date Noted  . Delirium due to another medical condition [F05] 08/05/2015  . UTI (lower urinary tract infection) [N39.0]   . Schizoaffective disorder, bipolar type (HCC) [F25.0] 07/25/2015   Total Time spent with patient: 25 minutes  Past Medical History: Please see H&P.  Past Medical History  Diagnosis Date  . Bipolar 1 disorder (HCC)    History reviewed. No pertinent past surgical history. Family History:  Family History  Problem Relation Age of Onset  . Hypertension Mother   . Mental illness Father    Social History: Please see H&P.  History  Alcohol Use No     History  Drug Use No    Social History   Social History  . Marital Status: Single    Spouse Name: N/A  . Number of Children: N/A  . Years of  Education: N/A   Social History Main Topics  . Smoking status: Never Smoker   . Smokeless tobacco: None  . Alcohol Use: No  . Drug Use: No  . Sexual Activity: No   Other Topics Concern  . None   Social History Narrative   Additional Social History:   Sleep: Poor  Appetite:  Fair  Current Medications: Current Facility-Administered Medications  Medication Dose Route Frequency Provider Last Rate Last Dose  . acetaminophen (TYLENOL) tablet 650 mg  650 mg Oral Q6H PRN Sanjuana Kava, NP   650 mg at 08/09/15 0108  . alum & mag hydroxide-simeth (MAALOX/MYLANTA) 200-200-20 MG/5ML suspension 30 mL  30 mL Oral Q4H PRN Sanjuana Kava, NP      . cephALEXin (KEFLEX) capsule 500 mg  500 mg Oral Q8H Charae Depaolis, MD   500 mg at 08/08/15 2128  . feeding supplement (ENSURE ENLIVE) (ENSURE ENLIVE) liquid 237 mL  237 mL Oral Daily PRN [name] Longs, MD      . hydrOXYzine (ATARAX/VISTARIL) tablet 25 mg  25 mg Oral Q6H PRN Tayra Dawe, MD      . magnesium hydroxide (MILK OF MAGNESIA) suspension 30 mL  30 mL Oral Daily PRN Sanjuana Kava, NP      . OLANZapine zydis (ZYPREXA) disintegrating tablet 5 mg  5 mg Oral TID PRN [name] Longs, MD  Or  . OLANZapine (ZYPREXA) injection 5 mg  5 mg Intramuscular TID PRN [name] Longs, MD      . Oxcarbazepine (TRILEPTAL) tablet 300 mg  300 mg Oral BID [name] Longs, MD   300 mg at 08/08/15 1706  . traZODone (DESYREL) tablet 100 mg  100 mg Oral QHS [name] Longs, MD   100 mg at 08/08/15 2128  . ziprasidone (GEODON) capsule 60 mg  60 mg Oral BID WC [name] Longs, MD        Lab Results:  No results found for this or any previous visit (from the past 48 hour(s)).  Blood Alcohol level:  Lab Results  Component Value Date   ETH <5 08/03/2015   ETH <5 07/25/2015    Physical Findings: AIMS: Facial and Oral Movements Muscles of Facial Expression: None, normal Lips and Perioral Area: None, normal Jaw: None, normal Tongue: None, normal,Extremity  Movements Upper (arms, wrists, hands, fingers): None, normal Lower (legs, knees, ankles, toes): None, normal, Trunk Movements Neck, shoulders, hips: None, normal, Overall Severity Severity of abnormal movements (highest score from questions above): None, normal Incapacitation due to abnormal movements: None, normal Patient's awareness of abnormal movements (rate only patient's report): No Awareness, Dental Status Current problems with teeth and/or dentures?: No Does patient usually wear dentures?: No  CIWA:  CIWA-Ar Total: 0 COWS:  COWS Total Score: 0  Musculoskeletal: Strength & Muscle Tone: within normal limits Gait & Station: normal Patient leans: N/A  Psychiatric Specialty Exam: Physical Exam  Review of Systems  Psychiatric/Behavioral: Positive for depression and hallucinations. Negative for suicidal ideas. The patient is nervous/anxious and has insomnia.   All other systems reviewed and are negative.  denies chest pain, no shortness of breath, no vomiting, no rash , at this time denies dysuria, denies frequency, no fever or chills  Blood pressure 135/90, pulse 97, temperature 98.6 F (37 C), temperature source Oral, resp. rate 16, height 5' 4.02" (1.626 m), weight 90.447 kg (199 lb 6.4 oz), last menstrual period 08/05/2015, SpO2 98 %.Body mass index is 34.21 kg/(m^2).  General Appearance: Guarded  Eye Contact:  None  Speech:  Slow  Volume:  Normal  Mood:  Anxious and Depressed  Affect:  Labile and Tearful  Thought Process:  Linear and Descriptions of Associations: Intact  Orientation:  Full (Time, Place, and Person)  Thought Content:  Rumination, AH - loud last night ,does not elaborate  Suicidal Thoughts:  No denies any suicidal ideations, denies any self injurious ideations ; able to contract for safety- is distressed by [facility]  Homicidal Thoughts:  No  Memory:  recent and remote grossly intact immediate - fair  Judgement:  Fair yet improving  Insight:  Good  Psychomotor  Activity:  Restlessness  Concentration:  Concentration: Fair and Attention Span: Fair  Recall:  Good  Fund of Knowledge:  Good  Language:  Good  Akathisia:  Negative  Handed:  Right  AIMS (if indicated):   no akathisia, no abnormal involuntary movements   Assets:  Desire for Improvement Resilience  ADL's:  Intact  Cognition:  WNL  Sleep:  Number of Hours: 4.25   Treatment Plan Summary:Jessica [name] is a Caucasian 40 y.o. female , who is single , employed , lives with her mother in [place] , who has a hx of schizoaffective do , as well as a current diagnosis of UTI , who presented to [facility] voluntarily as a walk-in initially . However pt later on eloped from [place] - was in the  woods confused and was found by police officers and returned to ED. Pt was found to have  UTI started on antibiotics. Pt reports several such episodes of confusion in the past when she has OD sed on medications and does not remember why. Pt today continues to be  labile , tearful and psychotic. Will continue treatment.   Daily contact with patient to assess and evaluate symptoms and progress in treatment, Medication management, Plan inpatient admission  and medications as below  Will increase Geodon to 60 mg po BID for psychosis - titrate higher slowly. EKG for qtc - wnl  Will continue Trileptal 300 mg po bid for mood sx. Trileptal was initiated in the ED. Will increase Trazodone to 125 mg po qhs for sleep. Continue Keflex 500 mg po tid for UTI. Will make available prn medications as per agitation protocol. Will continue to monitor vitals ,medication compliance and treatment side effects while patient is here.  Will monitor for medical issues as well as call consult as needed.  CSW will continue working on disposition.  Recreational therapy consult. Patient to participate in therapeutic milieu    Ajai Harville, MD 08/09/2015, 8:39 AM

## 2015-08-09 NOTE — Progress Notes (Signed)
Patient ID: Jessica [name], female   DOB: 05/30/1975, 40 y.o.   MRN: [id number] D: Client reports "I'm getting there" rates depression "3" of 10. Client lies supine looking up at ceiling, minimal interaction. A: Writer provides emotional support, encourages client to get up and walk, also reminds her to get a snack and drink fluids. Medications reviewed administered as ordered. Staff will monitor q4015min for safety. R: Client gets up and walks up and down the hall twice. When commended client responds "yes maam, thank you maam" Client is safe on the unit.

## 2015-08-09 NOTE — Progress Notes (Signed)
Adult Psychoeducational Group Note  Date:  08/09/2015 Time:  8:15 PM  Group Topic/Focus:  Wrap-Up Group:   The focus of this group is to help patients review their daily goal of treatment and discuss progress on daily workbooks.  Participation Level:  Did Not Attend  Pt did not attend wrap-up group.    Cleotilde NeerJasmine S Bobbijo Holst 08/09/2015, 8:38 PM

## 2015-08-09 NOTE — Progress Notes (Signed)
D- Patient denies SI, HI and AVH this shift. Patient has been isolative to her room not coming out for groups or meals.  When talking to the patient she closes her eyes tightly and does not respond.   A- Assess patient for safety, offer medications for safety, engage patient in 1:1 staff talks   R- Continue to monitor patient for safety.

## 2015-08-09 NOTE — [facility] Group Notes (Signed)
[facility] LCSW Group Therapy  08/09/2015 , 12:59 PM   Type of Therapy:  Group Therapy  Participation Level:  Active  Participation Quality:  Attentive  Affect:  Appropriate  Cognitive:  Alert  Insight:  Improving  Engagement in Therapy:  Engaged  Modes of Intervention:  Discussion, Exploration and Socialization  Summary of Progress/Problems: Today's group focused on the term Diagnosis.  Participants were asked to define the term, and then pronounce whether it is a negative, positive or neutral term. Invited.  Chose to not attend.  Jessica [name], Jessica [name] B 08/09/2015 , 12:59 PM

## 2015-08-10 DIAGNOSIS — F250 Schizoaffective disorder, bipolar type: Principal | ICD-10-CM

## 2015-08-10 NOTE — Progress Notes (Signed)
Recreation Therapy Notes  06.14.2017 approximately 2:45pm. Per MD order LRT met with patient to investigate ways to enhance tx during admission. Patient awake and in dayroom when LRT arrived to unit. Patient agreed to speak with LRT, patient made no eye contact with LRT during interaction, some psychomotor agitation present. 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 Patient shared her boss is a perfectionist which is stressful for her and that she struggles to multitask as much as the job requires. Patient reports she uses therapy as a coping skill, deep breathing, petting her cat and getting fresh air as a coping skill. Leisure interest of reading and anything nature related.   LRT to provide patient with community resources that align with leisure interest as well as attempt to introduce stress management techniques during patient admission.    L , LRT/CTRS    ,  L 08/10/2015 3:47 PM 

## 2015-08-10 NOTE — Progress Notes (Signed)
Recreation Therapy Notes  Animal-Assisted Activity (AAA) Program Checklist/Progress Notes Patient Eligibility Criteria Checklist & Daily Group note for Rec Tx Intervention  Date: 06.13.2017 Time: 2:45pm [address]: [address]    AAA/T Program Assumption of Risk Form signed by Patient/ or Parent Legal Guardian NO  Behavioral Response: Did not attend. Patient discussed with MD for appropriateness in pet therapy session. Both LRT and MD agree patient is appropriate for participation. Patient offered attendance to session, patient declined, stating "not at this time, but thank you ma'am." Patient appeared to experience thought blocking during interaction.   Marykay Lexenise L Lariyah Shetterly, LRT/CTRS        Kady Toothaker L 08/10/2015 9:21 AM

## 2015-08-10 NOTE — Tx Team (Signed)
Interdisciplinary Treatment Plan Update (Adult) Date: 08/10/2015   Date: 08/10/2015 10:29 AM  Progress in Treatment:  Attending groups: No Participating in groups: No Taking medication as prescribed: Yes  Tolerating medication: Yes  Family/Significant other contact made: No,  Patient understands diagnosis: Yes AEB seeking help with depression and hallucinations Discussing patient identified problems/goals with staff: Yes  Medical problems stabilized or resolved: Yes  Denies suicidal/homicidal ideation: Yes Patient has not harmed self or Others: Yes   New problem(s) identified: None identified at this time.   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    08/10/15: Pt is alert/oriented x4, however is anxious , tearful , speech is appropriate, but volume is soft . Pt reports having an episode last night when she was having AH - which she could not elaborate . However pt started crying when she started talking about it. Pt seems to be distressed by these voices. Pt also reports sleep as disrupted due to her having AH. Will increase Geodon to 60 mg po BID for psychosis - titrate higher slowly. EKG for qtc - wnl  Will continue Trileptal 300 mg po bid for mood sx. Trileptal was initiated in the ED. Will increase Trazodone to 125 mg po qhs for sleep. Continue Keflex 500 mg po tid for UTI.  Reason for Continuation of Hospitalization:  Anxiety  Depression Medication stabilization Auditory Hallucinations  Estimated length of stay: 4-5 days  Review of initial/current patient goals per  problem list:   1.  Goal(s): Patient will participate in aftercare plan  Met:  No  Target date: 3-5 days from date of admission   As evidenced by: Patient will participate within aftercare plan AEB aftercare provider and housing plan at discharge being identified.   07/26/15: Pt will return home and follow-up with outpatient providers   2.  Goal (s): Patient will exhibit decreased depressive symptoms and suicidal ideations.  Met:  No  Target date: 3-5 days from date of admission   As evidenced by: Patient will utilize self rating of depression at 3 or below and demonstrate decreased signs of depression or be deemed stable for discharge by MD. 08/05/15: Pt was admitted with symptoms of depression, rating 10/10.  08/10/15:  Rates depression a 5 today  3.  Goal(s): Patient will demonstrate decreased signs and symptoms of anxiety.  Met:  Yes  Target date: 3-5 days from date of admission   As evidenced by: Patient will utilize self rating of anxiety at 3 or below and demonstrated decreased signs of anxiety, or be deemed stable for discharge by MD 08/05/15;  Rates anxiety a 7 today 08/10/15:  Rates anxiety a 2 today  5.  Goal(s): Patient will demonstrate decreased signs of psychosis  . Met:  No . Target date: 3-5 days from date of admission  . As evidenced by: Patient will demonstrate decreased frequency of AVH or return to baseline function   -08/05/15 Pt endorses auditory hallucinations.   08/10/15:  Symptoms persist  Attendees:  Patient:    Family:    Physician: Ursula Alert  08/10/2015 10:29 AM  Nursing: Manuella Ghazi  08/10/2015 10:29 AM  Clinical Social Worker [place] 08/10/2015 10:29 AM  Other:  08/10/2015 10:29 AM  Clinical:   08/10/2015 10:29 AM  Other: , RN Charge Nurse 08/10/2015 10:29 AM  Other: Hilda Lias, P4CC

## 2015-08-10 NOTE — Progress Notes (Signed)
D- Patient observed lying in the bed.  Patient is in a depressed mood with a flat affect.  Patient brightens on approach and laughed with the nurse stating "I'm glad you made me laugh today". Patient is pleasant.  Patient currently denies SI/HI.  Patient positive for AH and states "They are getting quieter".  Patient did not attend wrap-up group this evening.    Patient verbalized that she wanted to try and go to sleep without her sleep medication and refused her Trazodone. No complaints.  A- Scheduled medications administered to patient, per MD orders. Support and encouragement provided.  Routine safety checks conducted every 15 minutes.  Patient informed to notify staff with problems or concerns. R- No adverse drug reactions noted. Patient contracts for safety at this time. Patient receptive, calm, and cooperative.  Patient remains safe at this time.

## 2015-08-10 NOTE — Progress Notes (Signed)
Patient ID: Jessica [name], female   DOB: 1975-03-31, 40 y.o.   MRN: [id number] [facility] MD Progress Note  08/10/2015 11:20 AM Jessica [name]  MRN:  [id number]  Subjective:  Jessica [name] reports, "I have been since last Friday. I was not doing well, stressed. Today, I'm doing so, so. Sleep is fair. I'm still hearing some voices"    Objective: Jessica [name] is a Caucasian 40 y.o. female, who is single, employed, lives with her mother in [place], [place] area , who has a hx of schizoaffective do, as well as a current diagnosis of UTI, who presented to [facility] voluntarily as a walk-in initially . However Jessica [name] later eloped from the [employer] - was in the woods confused and was found by the police officers and was returned to ED.   Jessica [name] is seen and chart reviewed. She lying down in her bed, alert & oriented x 4, however, she is not making any eye contact. Her speech is clear, soft, with decreased volume, not spontanueous. Jessica [name] reports that she is still hearing voices. She remains on her medications, denies any adverse effects or reactions. She reports sleep as fair due to her having AH. She continues to be labile and psychotic. Will continue to need medications and support.  Principal Problem: Schizoaffective disorder, bipolar type (HCC) superimposed by possible delirium 2/2 UTI Diagnosis:   Patient Active Problem List   Diagnosis Date Noted  . Schizoaffective disorder, bipolar type (HCC) [F25.0] 07/25/2015    Priority: High  . Delirium due to another medical condition [F05] 08/05/2015  . UTI (lower urinary tract infection) [N39.0]    Total Time spent with patient: 25 minutes  Past Medical History: Please see H&P.  Past Medical History  Diagnosis Date  . Bipolar 1 disorder (HCC)    History reviewed. No pertinent past surgical history. Family History:  Family History  Problem Relation Age of Onset  . Hypertension Mother   . Mental illness Father    Social History: Please see  H&P.  History  Alcohol Use No     History  Drug Use No    Social History   Social History  . Marital Status: Single    Spouse Name: N/A  . Number of Children: N/A  . Years of Education: N/A   Social History Main Topics  . Smoking status: Never Smoker   . Smokeless tobacco: None  . Alcohol Use: No  . Drug Use: No  . Sexual Activity: No   Other Topics Concern  . None   Social History Narrative   Additional Social History:   Sleep: Fair  Appetite:  Fair  Current Medications: Current Facility-Administered Medications  Medication Dose Route Frequency Provider Last Rate Last Dose  . acetaminophen (TYLENOL) tablet 650 mg  650 mg Oral Q6H PRN Sanjuana Kava, NP   650 mg at 08/09/15 0108  . alum & mag hydroxide-simeth (MAALOX/MYLANTA) 200-200-20 MG/5ML suspension 30 mL  30 mL Oral Q4H PRN Sanjuana Kava, NP      . cephALEXin (KEFLEX) capsule 500 mg  500 mg Oral Q8H Saramma Eappen, MD   500 mg at 08/10/15 0629  . feeding supplement (ENSURE ENLIVE) (ENSURE ENLIVE) liquid 237 mL  237 mL Oral Daily PRN [name] Longs, MD      . hydrOXYzine (ATARAX/VISTARIL) tablet 25 mg  25 mg Oral Q6H PRN Saramma Eappen, MD      . magnesium hydroxide (MILK OF MAGNESIA) suspension 30 mL  30 mL Oral Daily PRN  Sanjuana KavaAgnes I Nwoko, NP      . OLANZapine zydis (ZYPREXA) disintegrating tablet 5 mg  5 mg Oral TID PRN [name] LongsSaramma Eappen, MD       Or  . OLANZapine (ZYPREXA) injection 5 mg  5 mg Intramuscular TID PRN [name] LongsSaramma Eappen, MD      . Oxcarbazepine (TRILEPTAL) tablet 300 mg  300 mg Oral BID [name] LongsSaramma Eappen, MD   300 mg at 08/10/15 0744  . traZODone (DESYREL) tablet 125 mg  125 mg Oral QHS [name] LongsSaramma Eappen, MD   125 mg at 08/09/15 2136  . ziprasidone (GEODON) capsule 60 mg  60 mg Oral BID WC [name] LongsSaramma Eappen, MD   60 mg at 08/10/15 0744    Lab Results:  No results found for this or any previous visit (from the past 48 hour(s)).  Blood Alcohol level:  Lab Results  Component Value Date   ETH <5 08/03/2015    ETH <5 07/25/2015   Physical Findings: AIMS: Facial and Oral Movements Muscles of Facial Expression: None, normal Lips and Perioral Area: None, normal Jaw: None, normal Tongue: None, normal,Extremity Movements Upper (arms, wrists, hands, fingers): None, normal Lower (legs, knees, ankles, toes): None, normal, Trunk Movements Neck, shoulders, hips: None, normal, Overall Severity Severity of abnormal movements (highest score from questions above): None, normal Incapacitation due to abnormal movements: None, normal Patient's awareness of abnormal movements (rate only patient's report): No Awareness, Dental Status Current problems with teeth and/or dentures?: No Does patient usually wear dentures?: No  CIWA:  CIWA-Ar Total: 0 COWS:  COWS Total Score: 0  Musculoskeletal: Strength & Muscle Tone: within normal limits Gait & Station: normal Patient leans: N/A  Psychiatric Specialty Exam: Physical Exam  Review of Systems  Psychiatric/Behavioral: Positive for depression and hallucinations. Negative for suicidal ideas. The patient is nervous/anxious and has insomnia.   All other systems reviewed and are negative.  denies chest pain, no shortness of breath, no vomiting, no rash , at this time denies dysuria, denies frequency, no fever or chills  Blood pressure 115/84, pulse 123, temperature 98.7 F (37.1 C), temperature source Oral, resp. rate 16, height 5' 4.02" (1.626 m), weight 90.447 kg (199 lb 6.4 oz), last menstrual period 08/05/2015, SpO2 98 %.Body mass index is 34.21 kg/(m^2).  General Appearance: Guarded  Eye Contact:  None  Speech:  Slow  Volume:  Normal  Mood:  Anxious and Depressed  Affect:  Labile and Tearful  Thought Process:  Linear and Descriptions of Associations: Intact  Orientation:  Full (Time, Place, and Person)  Thought Content:  Rumination, AH - loud last night ,does not elaborate  Suicidal Thoughts:  No denies any suicidal ideations, denies any self injurious  ideations ; able to contract for safety- [facility]  Homicidal Thoughts:  No  Memory:  recent and remote grossly intact immediate - fair  Judgement:  Fair yet improving  Insight:  Good  Psychomotor Activity:  Restlessness  Concentration:  Concentration: Fair and Attention Span: Fair  Recall:  Good  Fund of Knowledge:  Good  Language:  Good  Akathisia:  Negative  Handed:  Right  AIMS (if indicated):   no akathisia, no abnormal involuntary movements   Assets:  Desire for Improvement Resilience  ADL's:  Intact  Cognition:  WNL  Sleep:  Number of Hours: 4.25   Treatment Plan Summary: Jessica [name] is a Caucasian 40 y.o. female , who is single , employed , [place] , who has a  hx of schizoaffective do , as well as a current diagnosis of UTI , who presented to [facility] voluntarily as a walk-in initially . However pt later on eloped from [place] - was in the woods confused and was found by police officers and returned to ED. Pt was found to have  UTI started on antibiotics. Pt reports several such episodes of confusion in the past when she has OD sed on medications and does not remember why. Pt today continues to be  labile , tearful and psychotic. Will continue treatment.  Daily contact with patient to assess and evaluate symptoms and progress in treatment, Medication management, Plan inpatient admission  and medications as below  Will continue Geodon to 60 mg po BID for psychosis - titrate higher slowly. EKG for qtc - wnl  Will continue Trileptal 300 mg po bid for mood sx. Trileptal was initiated in the ED. Will continue Trazodone125 mg po qhs for sleep. Will continue Keflex 500 mg po tid for UTI. Will make available prn medications as per agitation protocol. Will continue to monitor vitals ,medication compliance and treatment side effects while patient is here.  Will monitor for medical issues as well as call consult as needed.  CSW will continue working on  disposition.  Recreational therapy consult. Patient to participate in therapeutic milieu   Sanjuana Kava, NP, PMHNP, FNP-BC 08/10/2015, 11:20 AM Agree with NP progress note as above

## 2015-08-10 NOTE — Progress Notes (Signed)
Data Reports sleeping fair with PRN sleep med.  Rates depression 2/10, hopelessness 1/10, and anxiety 1/10. Affect blunted & sullen mood "depressed."  Denies HI, SI, AVH.  Denies physical complaints.  Today says she says she will work on relaxation by practicing deep breathing. Minimal interaction, poor eye contact- looks down while speaking with nurse, addresses nurse as "sir," guarded.  Spent most of day in bed resting. Did not attend any groups today.  Action Continued on Keflex for UTI.  Remained on 15 minute checks.  Encouraged to get up and out of bed and spend time in the day area.  Response Patient out of bed for meals in day room, remained guarded most of the day, remains safe on unit. This evening before supper interacted with nurse and when asked how she was doing patient smiled, made eye contact with nurse, and said "I'm doing better.

## 2015-08-11 NOTE — Progress Notes (Addendum)
D: Pt presents with flat affect and depressed mood on contact. Eye contact poor to none;  pt continues to look on the floor during conversations. Pt rated her depression 3/10, hopelessness 1/10 and anxiety 2/10 on self inventory sheet. Pt's goal for today is to "work on balance".  A: Scheduled medications administered as prescribed. Support and availability provided to pt throughout this shift. Writer encouraged pt to voice concerns / needs throughout this shift. Q 15 minutes checks maintained for safety as ordered without outburst or self harm gestures to note thus far. R: Pt A & O X3, denies SI, HI, VH and pain when assessed. Reports the voices are quieter. Compliant with medications as ordered when offered. Denies adverse drug reactions thus far. Attended scheduled groups. Remains safe on unit and off unit.

## 2015-08-11 NOTE — Progress Notes (Signed)
D- Patient is in a depressed mood with a flat affect.  Patient avoids eye contact and looks down at the ground during conversation.  Patient had minimal interaction with peers.  Patient denies SI/HI. Patient reports AH stating "It's quieter. Much better".  No complaints.  A- Scheduled medications administered.  Support and encouragement provided.  Routine safety checks conducted every 15 minutes.   R- Patient contracts for safety at this time. Patient remains safe at this time.

## 2015-08-11 NOTE — Plan of Care (Signed)
Problem: Activity: Goal: Interest or engagement in leisure activities will improve Outcome: Not Progressing Pt is with drawned and isolative to her room. Presents very flat with depressed mood and poor to no eye contact (looks on the floor) when approached.   Problem: Health Behavior/Discharge Planning: Goal: Compliance with therapeutic regimen will improve Outcome: Progressing Pt remains compliant with medications regimen as ordered. Attended scheduled groups.

## 2015-08-11 NOTE — [facility] Group Notes (Signed)
[facility] Group Notes:  (Counselor/Nursing/MHT/Case Management/Adjunct)  08/11/2015 1:15PM  Type of Therapy:  Group Therapy  Participation Level:  Active  Participation Quality:  Appropriate  Affect:  Flat  Cognitive:  Oriented  Insight:  Improving  Engagement in Group:  Limited  Engagement in Therapy:  Limited  Modes of Intervention:  Discussion, Exploration and Socialization  Summary of Progress/Problems: The topic for group was balance in life.  Pt participated in the discussion about when their life was in balance and out of balance and how this feels.  Pt discussed ways to get back in balance and short term goals they can work on to get where they want to be.  Stayed the entire time, engaged throughout.  States she is "so-so" re: balance.  She knows she will be balanced when her mood is better and she has more energy.  Talked about her AM routine of drinking coffee on the patio and watching the birds as something that brings balance, "or doing anything in nature like hikes or strolls."  [name], Luismiguel Lamere B 08/11/2015 4:24 PM

## 2015-08-11 NOTE — Progress Notes (Signed)
Patient ID: TINITA BROOKER, female   DOB: 1975/07/19, 40 y.o.   MRN: [id number] Patient ID: DAMIYAH DITMARS, female   DOB: February 02, 1976, 40 y.o.   MRN: [id number] [facility] MD Progress Note  08/11/2015 11:48 AM TONGELA ENCINAS  MRN:  [id number]  Subjective:  Ilaisaane reports, "I'm doing & feeling a lot better today. I slept well last night. I'm still hearing the voices, only that it is lessening. I'm tolerating my medicines well, but I don't think that I'm ready to be discharged as of yet. I will need few more days because I don't think I'm fully well"    Objective: DELORAS REICHARD is a Caucasian 40 y.o. female, who is single, employed, lives with her mother in [place], [place] area , who has a hx of schizoaffective do, as well as a current diagnosis of UTI, who presented to [facility] voluntarily as a walk-in initially . However [name] later eloped from the [employer] - was in the woods confused and was found by the police officers and was returned to ED.   Jaylaa is seen and chart reviewed. She lying down in her bed, alert & oriented x 4, She is making eye contact today. Her speech is clear, soft, with decreased volume, not spontanueous. Antonette reports that she is still hearing voices. She remains on her medications, denies any adverse effects or reactions. She reports that she slept well last night. She continues to present psychotic symptoms (auditory hallucinations). She says she did not feel ready to be discharged as yet because she is not fully well. Will continue to need medications and support.  Principal Problem: Schizoaffective disorder, bipolar type (HCC) superimposed by possible delirium 2/2 UTI Diagnosis:   Patient Active Problem List   Diagnosis Date Noted  . Schizoaffective disorder, bipolar type (HCC) [F25.0] 07/25/2015    Priority: High  . Delirium due to another medical condition [F05] 08/05/2015  . UTI (lower urinary tract infection) [N39.0]    Total Time spent with patient: 25  minutes  Past Medical History: Please see H&P.  Past Medical History  Diagnosis Date  . Bipolar 1 disorder (HCC)    History reviewed. No pertinent past surgical history. Family History:  Family History  Problem Relation Age of Onset  . Hypertension Mother   . Mental illness Father    Social History: Please see H&P.  History  Alcohol Use No     History  Drug Use No    Social History   Social History  . Marital Status: Single    Spouse Name: N/A  . Number of Children: N/A  . Years of Education: N/A   Social History Main Topics  . Smoking status: Never Smoker   . Smokeless tobacco: None  . Alcohol Use: No  . Drug Use: No  . Sexual Activity: No   Other Topics Concern  . None   Social History Narrative   Additional Social History:   Sleep: Good  Appetite:  Good  Current Medications: Current Facility-Administered Medications  Medication Dose Route Frequency Provider Last Rate Last Dose  . acetaminophen (TYLENOL) tablet 650 mg  650 mg Oral Q6H PRN Sanjuana Kava, NP   650 mg at 08/09/15 0108  . alum & mag hydroxide-simeth (MAALOX/MYLANTA) 200-200-20 MG/5ML suspension 30 mL  30 mL Oral Q4H PRN Sanjuana Kava, NP      . cephALEXin (KEFLEX) capsule 500 mg  500 mg Oral Q8H Saramma Eappen, MD   500 mg at 08/11/15 0634  .  feeding supplement (ENSURE ENLIVE) (ENSURE ENLIVE) liquid 237 mL  237 mL Oral Daily PRN [name] Longs, MD      . hydrOXYzine (ATARAX/VISTARIL) tablet 25 mg  25 mg Oral Q6H PRN Saramma Eappen, MD      . magnesium hydroxide (MILK OF MAGNESIA) suspension 30 mL  30 mL Oral Daily PRN Sanjuana Kava, NP      . OLANZapine zydis (ZYPREXA) disintegrating tablet 5 mg  5 mg Oral TID PRN [name] Longs, MD       Or  . OLANZapine (ZYPREXA) injection 5 mg  5 mg Intramuscular TID PRN [name] Longs, MD      . Oxcarbazepine (TRILEPTAL) tablet 300 mg  300 mg Oral BID [name] Longs, MD   300 mg at 08/11/15 0818  . traZODone (DESYREL) tablet 125 mg  125 mg Oral QHS  [name] Longs, MD   125 mg at 08/09/15 2136  . ziprasidone (GEODON) capsule 60 mg  60 mg Oral BID WC [name] Longs, MD   60 mg at 08/11/15 1610   Lab Results:  No results found for this or any previous visit (from the past 48 hour(s)).  Blood Alcohol level:  Lab Results  Component Value Date   ETH <5 08/03/2015   ETH <5 07/25/2015   Physical Findings: AIMS: Facial and Oral Movements Muscles of Facial Expression: None, normal Lips and Perioral Area: None, normal Jaw: None, normal Tongue: None, normal,Extremity Movements Upper (arms, wrists, hands, fingers): None, normal Lower (legs, knees, ankles, toes): None, normal, Trunk Movements Neck, shoulders, hips: None, normal, Overall Severity Severity of abnormal movements (highest score from questions above): None, normal Incapacitation due to abnormal movements: None, normal Patient's awareness of abnormal movements (rate only patient's report): No Awareness, Dental Status Current problems with teeth and/or dentures?: No Does patient usually wear dentures?: No  CIWA:  CIWA-Ar Total: 0 COWS:  COWS Total Score: 0  Musculoskeletal: Strength & Muscle Tone: within normal limits Gait & Station: normal Patient leans: N/A  Psychiatric Specialty Exam: Physical Exam  Review of Systems  Psychiatric/Behavioral: Positive for depression and hallucinations. Negative for suicidal ideas. The patient is nervous/anxious and has insomnia.   All other systems reviewed and are negative.  denies chest pain, no shortness of breath, no vomiting, no rash , at this time denies dysuria, denies frequency, no fever or chills  Blood pressure 102/79, pulse 109, temperature 97.6 F (36.4 C), temperature source Oral, resp. rate 16, height 5' 4.02" (1.626 m), weight 90.447 kg (199 lb 6.4 oz), last menstrual period 08/05/2015, SpO2 98 %.Body mass index is 34.21 kg/(m^2).  General Appearance: Guarded  Eye Contact:  None  Speech:  Slow  Volume:  Normal  Mood:   Anxious and Depressed  Affect:  Labile and Tearful  Thought Process:  Linear and Descriptions of Associations: Intact  Orientation:  Full (Time, Place, and Person)  Thought Content:  Rumination, AH - says it is lessening.  Suicidal Thoughts:  No denies any suicidal ideations, denies any self injurious ideations ; able to contract for safety- is distressed by [facility]  Homicidal Thoughts:  No  Memory:  recent and remote grossly intact immediate - fair  Judgement:  Fair yet improving  Insight:  Good  Psychomotor Activity:  Restlessness  Concentration:  Concentration: Fair and Attention Span: Fair  Recall:  Good  Fund of Knowledge:  Good  Language:  Good  Akathisia:  Negative  Handed:  Right  AIMS (if indicated):   no akathisia, no abnormal involuntary  movements   Assets:  Desire for Improvement Resilience  ADL's:  Intact  Cognition:  WNL  Sleep:  Number of Hours: 4.25   Treatment Plan Summary: Drucilla ChaletJennifer L Scholes is a Caucasian 40 y.o. female , who is single , employed , [place] , who has a hx of schizoaffective do , as well as a current diagnosis of UTI , [facility] . However pt later on eloped from [place] - was in the woods confused and was found by police officers and returned to ED. Pt was found to have  UTI started on antibiotics. Pt reports several such episodes of confusion in the past when she has OD sed on medications and does not remember why. Pt today continues to be  labile , tearful and psychotic. Will continue treatment.  Daily contact with patient to assess and evaluate symptoms and progress in treatment, Medication management, Plan inpatient admission  and medications as below  Will continue Geodon to 60 mg po BID for psychosis - titrate higher slowly. EKG for qtc - wnl  Will continue Trileptal 300 mg po bid for mood sx. Trileptal was initiated in the ED. Will continue Trazodone125 mg po qhs for sleep. Will  continue Keflex 500 mg po tid for UTI. Will make available prn medications as per agitation protocol. Will continue to monitor vitals ,medication compliance and treatment side effects while patient is here.  Will monitor for medical issues as well as call consult as needed.  CSW will continue working on disposition.  Recreational therapy consult. Patient to participate in therapeutic milieu. Sw to work on disposition. No medication changes made. Continue current plan of care.  Sanjuana KavaNwoko, Agnes I, NP, PMHNP, FNP-BC 08/11/2015, 11:48 AM Agree with NP progress note as above

## 2015-08-12 NOTE — Progress Notes (Signed)
Patient ID: Jessica [name], female   DOB: 12-20-1975, 40 y.o.   MRN: [id number] Patient ID: Jessica [name], female   DOB: 01-13-1976, 40 y.o.   MRN: [id number] Patient ID: Jessica [name], female   DOB: 1975/09/09, 40 y.o.   MRN: [id number] [facility] MD Progress Note  08/12/2015 2:10 PM Jessica [name]  MRN:  [id number]  Subjective:  Solara reports, "I'm doing better today than I did yesterday. But, I think today is going to be slow for me because I have not been eating much lately. My depression is at #4 & anxiety #3. I still hear the voices, only that they are faint".    Objective: Jessica [name] is a Caucasian 40 y.o. female, who is single, employed, lives with her mother in [place], [place] area , who has a hx of schizoaffective do, as well as a current diagnosis of UTI, who presented to [facility] voluntarily as a walk-in initially . However [name] later eloped from the [employer] - was in the woods confused and was found by the police officers and was returned to ED.   Arrin is seen and chart reviewed. She lying down in her bed, alert & oriented x 4, She is making eye contact today. Her speech is clear, soft, with decreased volume, not spontanueous. Jasmon reports that she is still hearing voices, only that they faint. She remains on her medications, denies any adverse effects or reactions. She reports that she slept well last night. She is doing well enough to get discharged this weekend. Will continue to need medications and support.  Principal Problem: Schizoaffective disorder, bipolar type (HCC) superimposed by possible delirium 2/2 UTI Diagnosis:   Patient Active Problem List   Diagnosis Date Noted  . Schizoaffective disorder, bipolar type (HCC) [F25.0] 07/25/2015    Priority: High  . Delirium due to another medical condition [F05] 08/05/2015  . UTI (lower urinary tract infection) [N39.0]    Total Time spent with patient: 25 minutes  Past Medical History: Please see  H&P.  Past Medical History  Diagnosis Date  . Bipolar 1 disorder (HCC)    History reviewed. No pertinent past surgical history. Family History:  Family History  Problem Relation Age of Onset  . Hypertension Mother   . Mental illness Father    Social History: Please see H&P.  History  Alcohol Use No     History  Drug Use No    Social History   Social History  . Marital Status: Single    Spouse Name: N/A  . Number of Children: N/A  . Years of Education: N/A   Social History Main Topics  . Smoking status: Never Smoker   . Smokeless tobacco: None  . Alcohol Use: No  . Drug Use: No  . Sexual Activity: No   Other Topics Concern  . None   Social History Narrative   Additional Social History:   Sleep: Good  Appetite:  Good  Current Medications: Current Facility-Administered Medications  Medication Dose Route Frequency Provider Last Rate Last Dose  . acetaminophen (TYLENOL) tablet 650 mg  650 mg Oral Q6H PRN Sanjuana Kava, NP   650 mg at 08/09/15 0108  . alum & mag hydroxide-simeth (MAALOX/MYLANTA) 200-200-20 MG/5ML suspension 30 mL  30 mL Oral Q4H PRN Sanjuana Kava, NP      . cephALEXin (KEFLEX) capsule 500 mg  500 mg Oral Q8H Saramma Eappen, MD   500 mg at 08/12/15 1410  . feeding supplement (ENSURE  ENLIVE) (ENSURE ENLIVE) liquid 237 mL  237 mL Oral Daily PRN [name] LongsSaramma Eappen, MD      . hydrOXYzine (ATARAX/VISTARIL) tablet 25 mg  25 mg Oral Q6H PRN Saramma Eappen, MD      . magnesium hydroxide (MILK OF MAGNESIA) suspension 30 mL  30 mL Oral Daily PRN Sanjuana KavaAgnes I Nwoko, NP      . OLANZapine zydis (ZYPREXA) disintegrating tablet 5 mg  5 mg Oral TID PRN [name] LongsSaramma Eappen, MD       Or  . OLANZapine (ZYPREXA) injection 5 mg  5 mg Intramuscular TID PRN [name] LongsSaramma Eappen, MD      . Oxcarbazepine (TRILEPTAL) tablet 300 mg  300 mg Oral BID [name] LongsSaramma Eappen, MD   300 mg at 08/12/15 0823  . traZODone (DESYREL) tablet 125 mg  125 mg Oral QHS [name] LongsSaramma Eappen, MD   125 mg at 08/11/15 2114  .  ziprasidone (GEODON) capsule 60 mg  60 mg Oral BID WC [name] LongsSaramma Eappen, MD   60 mg at 08/12/15 [id number]   Lab Results:  No results found for this or any previous visit (from the past 48 hour(s)).  Blood Alcohol level:  Lab Results  Component Value Date   ETH <5 08/03/2015   ETH <5 07/25/2015   Physical Findings: AIMS: Facial and Oral Movements Muscles of Facial Expression: None, normal Lips and Perioral Area: None, normal Jaw: None, normal Tongue: None, normal,Extremity Movements Upper (arms, wrists, hands, fingers): None, normal Lower (legs, knees, ankles, toes): None, normal, Trunk Movements Neck, shoulders, hips: None, normal, Overall Severity Severity of abnormal movements (highest score from questions above): None, normal Incapacitation due to abnormal movements: None, normal Patient's awareness of abnormal movements (rate only patient's report): No Awareness, Dental Status Current problems with teeth and/or dentures?: No Does patient usually wear dentures?: No  CIWA:  CIWA-Ar Total: 0 COWS:  COWS Total Score: 0  Musculoskeletal: Strength & Muscle Tone: within normal limits Gait & Station: normal Patient leans: N/A  Psychiatric Specialty Exam: Physical Exam  Review of Systems  Psychiatric/Behavioral: Positive for depression and hallucinations. Negative for suicidal ideas. The patient is nervous/anxious and has insomnia.   All other systems reviewed and are negative.  denies chest pain, no shortness of breath, no vomiting, no rash , at this time denies dysuria, denies frequency, no fever or chills  Blood pressure 123/86, pulse 110, temperature 98.6 F (37 C), temperature source Oral, resp. rate 16, height 5' 4.02" (1.626 m), weight 90.447 kg (199 lb 6.4 oz), last menstrual period 08/05/2015, SpO2 98 %.Body mass index is 34.21 kg/(m^2).  General Appearance: Guarded  Eye Contact:  None  Speech:  Slow  Volume:  Normal  Mood:  Anxious and Depressed  Affect:  Labile and Tearful   Thought Process:  Linear and Descriptions of Associations: Intact  Orientation:  Full (Time, Place, and Person)  Thought Content:  Rumination, AH - says it is lessening.  Suicidal Thoughts:  No denies any suicidal ideations, denies any self injurious ideations ; able to contract for safety- [facility]  Homicidal Thoughts:  No  Memory:  recent and remote grossly intact immediate - fair  Judgement:  Fair yet improving  Insight:  Good  Psychomotor Activity:  Restlessness  Concentration:  Concentration: Fair and Attention Span: Fair  Recall:  Good  Fund of Knowledge:  Good  Language:  Good  Akathisia:  Negative  Handed:  Right  AIMS (if indicated):   no akathisia, no abnormal involuntary movements  Assets:  Desire for Improvement Resilience  ADL's:  Intact  Cognition:  WNL  Sleep:  Number of Hours: 6.75   Treatment Plan Summary: FLORIDA NOLTON is a Caucasian 40 y.o. female , who is single , employed , lives with her mother in [place] , who has a hx of schizoaffective do , as well as a current diagnosis of UTI , who presented to [facility] voluntarily as a walk-in initially . However pt later on eloped from [place] - was in the woods confused and was found by police officers and returned to ED. Pt was found to have  UTI started on antibiotics. Pt reports several such episodes of confusion in the past when she has OD sed on medications and does not remember why. Pt today continues to be  labile , tearful and psychotic. Will continue treatment.  Daily contact with patient to assess and evaluate symptoms and progress in treatment, Medication management, Plan inpatient admission  and medications as below  Will continue Geodon to 60 mg po BID for psychosis - titrate higher slowly. EKG for qtc - wnl  Will continue Trileptal 300 mg po bid for mood sx. Trileptal was initiated in the ED. Will continue Trazodone125 mg po qhs for sleep. Will continue Keflex 500 mg po tid for UTI. Will make  available prn medications as per agitation protocol. Will continue to monitor vitals ,medication compliance and treatment side effects while patient is here.  Will monitor for medical issues as well as call consult as needed.  CSW will continue working on disposition.  Recreational therapy consult. Patient to participate in therapeutic milieu. Sw to work on disposition. No medication changes made. Continue current plan of care.  Sanjuana Kava, NP, PMHNP, FNP-BC 08/12/2015, 2:10 PM Agree with NP progress note as above

## 2015-08-12 NOTE — Progress Notes (Signed)
Patient ID: Jessica [name], female   DOB: 03/13/1975, 40 y.o.   MRN: [id number] D: Client isolates to room much of this shift, with encouragement gets up and walks down the hall. Client reports depression "3" of 10, mom visited today "that went really well" Client reports admission has been helpful "give me a chance to work on some issues in my head, figure out how to use the coping skills I learned in my therapy sessions, i.e. Meditation"  Client reports took milk of magnesia today "I noticed I wasn't going like I use to so I took a mild laxative." A Writer encouraged client to walk and drink water to help with regular bowel movements. Medications reviewed, administered as ordered. Staff will monitor q3915min for safety. R: Client is safe on the unit.

## 2015-08-12 NOTE — Progress Notes (Signed)
D: Patient is A&Ox4, denies SI/HI but does admit to auditory hallucinations. States that they are better than they were but still there, just "very low". Patient brightens upon approach but make no eye contact when talking to you. Stated that she slept better last night than she had been, but still isn't sleeping all the way through the night. A: Patient given scheduled medications and as needed. Continue monitoring patient Q15 minutes and prn for safety. R: Patient remains safe. Patient stated she will let staff know if she no longer feels safe.  Teruo Stilley, Wyman SongsterAngela Marie, RN

## 2015-08-12 NOTE — [facility] Group Notes (Signed)
[facility] Note   08/12/2015 11:46 AM  Participation Quality:  Invited.  Chose to not attend    Kiribatiorth, Thereasa Distanceodney B

## 2015-08-12 NOTE — [facility] Group Notes (Signed)
[facility] LCSW Group Therapy  08/12/2015  1:05 PM  Type of Therapy:  Group therapy  Participation Level:  Active  Participation Quality:  Attentive  Affect:  Flat  Cognitive:  Oriented  Insight:  Limited  Engagement in Therapy:  Limited  Modes of Intervention:  Discussion, Socialization  Summary of Progress/Problems:  Chaplain was here to lead a group on themes of hope and courage. Invited.  Chose to not attend.  Jessica [name], Jessica [name] 08/12/2015 1:05 PM

## 2015-08-13 NOTE — Progress Notes (Signed)
Jessica [name] states she is feeling "so so". Anxiety 5-6/10, Depression 3/10. Her Mom visited her today; she is still unclear as to where she will residing once she is discharged. She was initially staying with her mom however at this point there is some discord between the 2 and she may not be able to stay with her after this admission. Her goal today was "to improve my concentration but I did not have a plan on how I was going to do that so I was not able to accomplish my goal today". Denies SI/HI/AVH. Contracts for safety. Encouragement and support given. Medications administered as prescribed. Continue to monitor for patient safety and medication effectiveness.

## 2015-08-13 NOTE — Progress Notes (Signed)
DAR NOTE: Patient presents with flat affect and depressed mood.  Denies pain, auditory and visual hallucinations.  Rates depression at 4, hopelessness at 0, and anxiety at 2.  Reports energy level as low and concentration as poor.  Maintained on routine safety checks.  Medications given as prescribed.  Support and encouragement offered as needed.  Attended group and participated.  States goal for today is "to increase my concentration."  Patient isolative to her room most of this shift.   Patient continues with antibiotic therapy for UTI.  No adverse reaction noted.  Offered no complaint.

## 2015-08-13 NOTE — Progress Notes (Signed)
[facility] MD Progress Note  08/13/2015 1:51 PM Jessica [name]  MRN:  [id number]  Subjective:  Patient reports, "I'm doing and feeling better today."   Objective: Jessica [name] is awake, alert and oriented X4. Seen sitting on her bed. Patient is engaged throughout this discussion, however she makes minimal to no eye contact, patient continues to look at the floor and smiles periodically. Patient reports attending group session.  Denies suicidal or homicidal ideation. Reports auditory hallucination of "small voices." patient denies visual hallucination and does not appear to be responding to internal stimuli. Patient appears pleasant but guarded. reports she is medication compliant without mediation side effects.States her depression 3/10. Reports good appetite and states she is resting well. Support, encouragement and reassurance was provided.   Principal Problem: Schizoaffective disorder, bipolar type (HCC) superimposed by possible delirium 2/2 UTI Diagnosis:   Patient Active Problem List   Diagnosis Date Noted  . Delirium due to another medical condition [F05] 08/05/2015  . UTI (lower urinary tract infection) [N39.0]   . Schizoaffective disorder, bipolar type (HCC) [F25.0] 07/25/2015   Total Time spent with patient: 25 minutes  Past Medical History: Please see H&P.  Past Medical History  Diagnosis Date  . Bipolar 1 disorder (HCC)    History reviewed. No pertinent past surgical history. Family History:  Family History  Problem Relation Age of Onset  . Hypertension Mother   . Mental illness Father    Social History: Please see H&P.  History  Alcohol Use No     History  Drug Use No    Social History   Social History  . Marital Status: Single    Spouse Name: N/A  . Number of Children: N/A  . Years of Education: N/A   Social History Main Topics  . Smoking status: Never Smoker   . Smokeless tobacco: None  . Alcohol Use: No  . Drug Use: No  . Sexual Activity: No    Other Topics Concern  . None   Social History Narrative   Additional Social History:   Sleep: Good  Appetite:  Good  Current Medications: Current Facility-Administered Medications  Medication Dose Route Frequency Provider Last Rate Last Dose  . acetaminophen (TYLENOL) tablet 650 mg  650 mg Oral Q6H PRN Sanjuana Kava, NP   650 mg at 08/09/15 0108  . alum & mag hydroxide-simeth (MAALOX/MYLANTA) 200-200-20 MG/5ML suspension 30 mL  30 mL Oral Q4H PRN Sanjuana Kava, NP      . cephALEXin (KEFLEX) capsule 500 mg  500 mg Oral Q8H Saramma Eappen, MD   500 mg at 08/13/15 0622  . feeding supplement (ENSURE ENLIVE) (ENSURE ENLIVE) liquid 237 mL  237 mL Oral Daily PRN [name] Longs, MD      . hydrOXYzine (ATARAX/VISTARIL) tablet 25 mg  25 mg Oral Q6H PRN Saramma Eappen, MD      . magnesium hydroxide (MILK OF MAGNESIA) suspension 30 mL  30 mL Oral Daily PRN Sanjuana Kava, NP   30 mL at 08/12/15 1716  . OLANZapine zydis (ZYPREXA) disintegrating tablet 5 mg  5 mg Oral TID PRN [name] Longs, MD       Or  . OLANZapine (ZYPREXA) injection 5 mg  5 mg Intramuscular TID PRN [name] Longs, MD      . Oxcarbazepine (TRILEPTAL) tablet 300 mg  300 mg Oral BID [name] Longs, MD   300 mg at 08/13/15 0834  . traZODone (DESYREL) tablet 125 mg  125 mg Oral QHS Saramma Eappen,  MD   125 mg at 08/12/15 2119  . ziprasidone (GEODON) capsule 60 mg  60 mg Oral BID WC [name] Longs, MD   60 mg at 08/13/15 0626   Lab Results:  No results found for this or any previous visit (from the past 48 hour(s)).  Blood Alcohol level:  Lab Results  Component Value Date   ETH <5 08/03/2015   ETH <5 07/25/2015   Physical Findings: AIMS: Facial and Oral Movements Muscles of Facial Expression: None, normal Lips and Perioral Area: None, normal Jaw: None, normal Tongue: None, normal,Extremity Movements Upper (arms, wrists, hands, fingers): None, normal Lower (legs, knees, ankles, toes): None, normal, Trunk Movements Neck,  shoulders, hips: None, normal, Overall Severity Severity of abnormal movements (highest score from questions above): None, normal Incapacitation due to abnormal movements: None, normal Patient's awareness of abnormal movements (rate only patient's report): No Awareness, Dental Status Current problems with teeth and/or dentures?: No Does patient usually wear dentures?: No  CIWA:  CIWA-Ar Total: 0 COWS:  COWS Total Score: 0  Musculoskeletal: Strength & Muscle Tone: within normal limits Gait & Station: normal Patient leans: N/A  Psychiatric Specialty Exam: Physical Exam  Nursing note and vitals reviewed. Constitutional: She is oriented to person, place, and time. She appears well-developed.  Neck: Normal range of motion.  Musculoskeletal: Normal range of motion.  Neurological: She is alert and oriented to person, place, and time.  Skin: Skin is dry.  Psychiatric: She has a normal mood and affect. Her behavior is normal.    Review of Systems  Psychiatric/Behavioral: Positive for depression and hallucinations. Negative for suicidal ideas. The patient is nervous/anxious and has insomnia.   All other systems reviewed and are negative.  denies chest pain, no shortness of breath, no vomiting, no rash , at this time denies dysuria, denies frequency, no fever or chills  Blood pressure 149/99, pulse 98, temperature 98.3 F (36.8 C), temperature source Oral, resp. rate 16, height 5' 4.02" (1.626 m), weight 90.447 kg (199 lb 6.4 oz), last menstrual period 08/05/2015, SpO2 98 %.Body mass index is 34.21 kg/(m^2).  General Appearance: Guarded, flat  Eye Contact:  None  Speech:  Slow  Volume:  Normal  Mood:  Anxious and Depressed  Affect:  Labile and Tearful  Thought Process:  Linear and Descriptions of Associations: Intact  Orientation:  Full (Time, Place, and Person)  Thought Content:  Rumination  Suicidal Thoughts:  No denies any suicidal ideations, denies any self injurious ideations ; able  to contract for safety- is distressed by [facility]  Homicidal Thoughts:  No  Memory:  recent and remote grossly intact immediate - fair  Judgement:  Fair   Insight:  Good  Psychomotor Activity:  Restlessness- improving   Concentration:  Concentration: Fair and Attention Span: Fair  Recall:  Good  Fund of Knowledge:  Good  Language:  Good  Akathisia:  Negative  Handed:  Right  AIMS (if indicated):   no akathisia, no abnormal involuntary movements   Assets:  Desire for Improvement Resilience  ADL's:  Intact  Cognition:  WNL  Sleep:  Number of Hours: 6.25     I agree with current treatment plan on 08/13/2015, Patient seen face-to-face for psychiatric evaluation follow-up, chart reviewed. Reviewed the information documented and agree with the treatment plan.   Treatment Plan Summary:  Daily contact with patient to assess and evaluate symptoms and progress in treatment, Medication management, Plan inpatient admission  and medications as below  Will continue Geodon to  60 mg po BID for psychosis  Will continue Trileptal 300 mg po bid for mood sx. Trileptal was initiated in the ED. Will continue Trazodone125 mg po qhs for sleep. Will continue Keflex 500 mg po tid for UTI. Will make available prn medications as per agitation protocol. Will continue to monitor vitals ,medication compliance and treatment side effects while patient is here.  Will monitor for medical issues as well as call consult as needed.  CSW will continue working on disposition.  Recreational therapy consult. Patient to participate in therapeutic milieu. S/W to work on disposition. No medication changes made. Continue current plan of care.  Oneta Rackanika N Lewis, NP 08/13/2015, 1:51 PM Agree with NP note as above

## 2015-08-13 NOTE — [facility] Group Notes (Signed)
[facility] LCSW Group Therapy Note  08/13/2015 10:00 AM  Type of Therapy and Topic:  Group Therapy: Avoiding Self-Sabotaging and Enabling Behaviors  Participation Level:  Did Not Attend although specifically encouraged to do so   Carney Bernatherine C Harrill, LCSW

## 2015-08-13 NOTE — [facility] Group Notes (Signed)
[facility] Group Notes:  (Nursing/MHT/Case Management/Adjunct)  Date:  08/13/2015  Time:  5:52 PM  Type of Therapy:  Nurse Education  Participation Level:  Did Not Attend   Jessica [name] 08/13/2015, 5:52 PM 

## 2015-08-14 NOTE — Progress Notes (Signed)
[facility]  08/14/2015 11:39 AM Jessica [name]  MRN:  [id number]  Subjective:  Patient reports, "I'm doing so so, I did have a crying spell on last night after my mother left."   Objective: Jessica [name] is awake, alert and oriented X4. Seen sitting on her bed. Patient is engaged throughout this discussion, [name] makes minimal to no eye contact, patient continues to look at the floor and smiles periodically. Patient reports attending group session.  Denies suicidal or homicidal ideation. Reports auditory hallucination of "small voices." patient denies visual hallucination and does not appear to be responding to internal stimuli. Patient appears pleasant but guarded. reports she is medication compliant without mediation side effects.States her depression 5/10. Reports good appetite. Patient reports her mother came of a visit and this caused a lot of stress and anxiety and was unable to rest well do to" thinking to much.". Support, encouragement and reassurance was provided.   Principal Problem: Schizoaffective disorder, bipolar type (HCC) superimposed by possible delirium 2/2 UTI Diagnosis:   Patient Active Problem List   Diagnosis Date Noted  . Delirium due to another medical condition [F05] 08/05/2015  . UTI (lower urinary tract infection) [N39.0]   . Schizoaffective disorder, bipolar type (HCC) [F25.0] 07/25/2015   Total Time spent with patient: 25 minutes  Past Medical History: Please see H&P.  Past Medical History  Diagnosis Date  . Bipolar 1 disorder (HCC)    History reviewed. No pertinent past surgical history. Family History:  Family History  Problem Relation Age of Onset  . Hypertension Mother   . Mental illness Father    Social History: Please see H&P.  History  Alcohol Use No     History  Drug Use No    Social History   Social History  . Marital Status: Single    Spouse Name: N/A  . Number of Children: N/A  . Years of Education: N/A   Social  History Main Topics  . Smoking status: Never Smoker   . Smokeless tobacco: None  . Alcohol Use: No  . Drug Use: No  . Sexual Activity: No   Other Topics Concern  . None   Social History Narrative   Additional Social History:   Sleep: Fair  Appetite:  Good  Current Medications: Current Facility-Administered Medications  Medication Dose Route Frequency Provider Last Rate Last Dose  . acetaminophen (TYLENOL) tablet 650 mg  650 mg Oral Q6H PRN Sanjuana KavaAgnes I Nwoko, NP   650 mg at 08/09/15 0108  . alum & mag hydroxide-simeth (MAALOX/MYLANTA) 200-200-20 MG/5ML suspension 30 mL  30 mL Oral Q4H PRN Sanjuana KavaAgnes I Nwoko, NP      . cephALEXin (KEFLEX) capsule 500 mg  500 mg Oral Q8H Saramma Eappen, MD   500 mg at 08/14/15 0645  . feeding supplement (ENSURE ENLIVE) (ENSURE ENLIVE) liquid 237 mL  237 mL Oral Daily PRN [name] LongsSaramma Eappen, MD      . hydrOXYzine (ATARAX/VISTARIL) tablet 25 mg  25 mg Oral Q6H PRN Saramma Eappen, MD      . magnesium hydroxide (MILK OF MAGNESIA) suspension 30 mL  30 mL Oral Daily PRN Sanjuana KavaAgnes I Nwoko, NP   30 mL at 08/12/15 1716  . OLANZapine zydis (ZYPREXA) disintegrating tablet 5 mg  5 mg Oral TID PRN [name] LongsSaramma Eappen, MD       Or  . OLANZapine (ZYPREXA) injection 5 mg  5 mg Intramuscular TID PRN [name] LongsSaramma Eappen, MD      . Oxcarbazepine (TRILEPTAL)  tablet 300 mg  300 mg Oral BID [name] Longs, MD   300 mg at 08/14/15 0814  . traZODone (DESYREL) tablet 125 mg  125 mg Oral QHS [name] Longs, MD   125 mg at 08/13/15 2140  . ziprasidone (GEODON) capsule 60 mg  60 mg Oral BID WC [name] Longs, MD   60 mg at 08/14/15 0815   Lab Results:  No results found for this or any previous visit (from the past 48 hour(s)).  Blood Alcohol level:  Lab Results  Component Value Date   ETH <5 08/03/2015   ETH <5 07/25/2015   Physical Findings: AIMS: Facial and Oral Movements Muscles of Facial Expression: None, normal Lips and Perioral Area: None, normal Jaw: None, normal Tongue: None,  normal,Extremity Movements Upper (arms, wrists, hands, fingers): None, normal Lower (legs, knees, ankles, toes): None, normal, Trunk Movements Neck, shoulders, hips: None, normal, Overall Severity Severity of abnormal movements (highest score from questions above): None, normal Incapacitation due to abnormal movements: None, normal Patient's awareness of abnormal movements (rate only patient's report): No Awareness, Dental Status Current problems with teeth and/or dentures?: No Does patient usually wear dentures?: No  CIWA:  CIWA-Ar Total: 0 COWS:  COWS Total Score: 0  Musculoskeletal: Strength & Muscle Tone: within normal limits Gait & Station: normal Patient leans: N/A  Psychiatric Specialty Exam: Physical Exam  Nursing note and vitals reviewed. Constitutional: She is oriented to person, place, and time. She appears well-developed.  Neck: Normal range of motion.  Musculoskeletal: Normal range of motion.  Neurological: She is alert and oriented to person, place, and time.  Skin: Skin is dry.  Psychiatric: She has a normal mood and affect. Her behavior is normal.    Review of Systems  Psychiatric/Behavioral: Positive for depression and hallucinations. Negative for suicidal ideas. The patient is nervous/anxious and has insomnia.   All other systems reviewed and are negative.  denies chest pain, no shortness of breath, no vomiting, no rash , at this time denies dysuria, denies frequency, no fever or chills  Blood pressure 125/78, pulse 107, temperature 98.7 F (37.1 C), temperature source Oral, resp. rate 14, height 5' 4.02" (1.626 m), weight 90.447 kg (199 lb 6.4 oz), last menstrual period 08/05/2015, SpO2 98 %.Body mass index is 34.21 kg/(m^2).  General Appearance: Guarded, flat but pleasant   Eye Contact:  None  Speech:  Slow  Volume:  Normal  Mood:  Anxious and Depressed  Affect:  Labile and Tearful  Thought Process:  Linear and Descriptions of Associations: Intact   Orientation:  Full (Time, Place, and Person)  Thought Content:  Rumination  Suicidal Thoughts:  No denies any suicidal ideations, denies any self injurious ideations ; able to contract for safety- is distressed by [facility]  Homicidal Thoughts:  No  Memory:  recent and remote grossly intact immediate - fair  Judgement:  Fair yet improving  Insight:  Good  Psychomotor Activity:  Restlessness  Concentration:  Concentration: Fair and Attention Span: Fair  Recall:  Good  Fund of Knowledge:  Good  Language:  Good  Akathisia:  Negative  Handed:  Right  AIMS (if indicated):   no akathisia, no abnormal involuntary movements   Assets:  Desire for Improvement Resilience  ADL's:  Intact  Cognition:  WNL  Sleep:  Number of Hours: 6.25     I agree with current treatment plan on 08/14/2015, Patient seen face-to-face for psychiatric evaluation follow-up, chart reviewed. Reviewed the information documented and agree with the treatment plan.  Treatment Plan Summary:  Daily contact with patient to assess and evaluate symptoms and progress in treatment, Medication management, Plan inpatient admission  and medications as below  Will continue Geodon to 60 mg po BID for psychosis Will continue Trileptal 300 mg po bid for mood sx. Trileptal was initiated in the ED. Will continue Trazodone125 mg po qhs for sleep. Will continue Keflex 500 mg po tid for UTI.- improving symptoms  Will make available prn medications as per agitation protocol. Will continue to monitor vitals ,medication compliance and treatment side effects while patient is here.  Will monitor for medical issues as well as call consult as needed.  CSW will continue working on disposition.  Recreational therapy consult. Patient to participate in therapeutic milieu. S/W to work on disposition. No medication changes made. Continue current plan of care.  Oneta Rack, NP 08/14/2015, 11:39 AM I agreed with findings and treatment plan of this  patient.

## 2015-08-14 NOTE — [facility] Group Notes (Signed)
[facility] LCSW Group Therapy  [name] 10:30 [name]  Type of Therapy:  Group Therapy  Participation Level:  Did Not Attend   Beverly SessionsLINDSEY, Jessica [name], Jessica [name]

## 2015-08-14 NOTE — Progress Notes (Signed)
Adult Psychoeducational Group Note  Date:  08/14/2015 Time:  9:12 PM  Group Topic/Focus:  Wrap-Up Group:   The focus of this group is to help patients review their daily goal of treatment and discuss progress on daily workbooks.  Participation Level:  Did Not Attend  Additional Comments:  Pt was encouraged to attend group, but did not.  [name], [name] 08/14/2015, 9:12 PM

## 2015-08-14 NOTE — Progress Notes (Signed)
DAR NOTE: Patient presents with depressed mood and affect.  Denies pain, auditory and visual hallucinations.  Rates depression at 5, hopelessness at 0, and anxiety at 4.  Described energy level as low and concentration as poor.  Maintained on routine safety checks.  Medications given as prescribed.  Support and encouragement offered as needed.  Attended group and participated.  States goal for today is "to be kind to others."  Patient remained isolative to her room.  Patient still not making eye contact. Offered no complaint.  Antibiotic therapy continues for UTI.  No adverse reaction noted.

## 2015-08-15 MED ORDER — LITHIUM CARBONATE 150 MG PO CAPS
150.0000 mg | ORAL_CAPSULE | Freq: Two times a day (BID) | ORAL | Status: DC
  Administered 2015-08-16: 150 mg via ORAL
  Filled 2015-08-15 (×6): qty 1

## 2015-08-15 NOTE — Progress Notes (Signed)
D. Pt has been in room and in bed for majority of shift,chose not to attend evening group activity. Pt had minimal eye contact with staff, did endorse and has appeared to be depressed, did receive all bedtime medications without incident and did not verbalize any complaints of pain. A. Support and encouragement provided. R. Safety maintained, will continue to monitor.

## 2015-08-15 NOTE — [facility] Group Notes (Signed)
[facility] LCSW Group Therapy  08/15/2015 1:15 pm  Type of Therapy: Process Group Therapy  Participation Level:  Active  Participation Quality:  Appropriate  Affect:  Flat  Cognitive:  Oriented  Insight:  Improving  Engagement in Group:  Limited  Engagement in Therapy:  Limited  Modes of Intervention:  Activity, Clarification, Education, Problem-solving and Support  Summary of Progress/Problems: Today's group addressed the issue of overcoming obstacles.  Patients were asked to identify their biggest obstacle post d/c that stands in the way of their on-going success, and then problem solve as to how to manage this. Invited.  Chose to not attend.  Jessica [name], Jessica [name] 08/15/2015   3:51 PM

## 2015-08-15 NOTE — Progress Notes (Signed)
[facility] MD Progress Note  08/15/2015 12:44 PM Jessica [name]  MRN:  [id number]  Subjective:  Patient reports, "I'm still depressed , I am not hearing anything , but I think I had another psychotic episode.'    Objective: Jessica [name] is awake, alert and oriented X4.Pt seen in bed this AM , avoided eye contact . Pt with constricted affect and reports mood as depressed. Pt per staff remains withdrawn mostly , appears depressed , needs encouragement to get out of bed and have her meals as well as medications. Pt denies AH - however appears very anxious . Discussed adding Lithium to augment the effect of other medications. Pt agrees. She denies any ADRs of medications.  She also has sleep issues - but is concerned about being dependent on Trazodone and wonders whether she should take it - discussed with her that Trazodone will help with her sleep during this acute phase .  Principal Problem: Schizoaffective disorder, bipolar type (HCC) superimposed by possible delirium 2/2 UTI Diagnosis:   Patient Active Problem List   Diagnosis Date Noted  . Delirium due to another medical condition [F05] 08/05/2015  . UTI (lower urinary tract infection) [N39.0]   . Schizoaffective disorder, bipolar type (HCC) [F25.0] 07/25/2015   Total Time spent with patient: 25 minutes  Past Medical History: Please see H&P.  Past Medical History  Diagnosis Date  . Bipolar 1 disorder (HCC)    History reviewed. No pertinent past surgical history. Family History:  Family History  Problem Relation Age of Onset  . Hypertension Mother   . Mental illness Father    Social History: Please see H&P.  History  Alcohol Use No     History  Drug Use No    Social History   Social History  . Marital Status: Single    Spouse Name: N/A  . Number of Children: N/A  . Years of Education: N/A   Social History Main Topics  . Smoking status: Never Smoker   . Smokeless tobacco: None  . Alcohol Use: No  . Drug Use:  No  . Sexual Activity: No   Other Topics Concern  . None   Social History Narrative   Additional Social History:   Sleep: Fair  Appetite:  observed as poor- needs encouragement to go to the cafeteria   Current Medications: Current Facility-Administered Medications  Medication Dose Route Frequency Provider Last Rate Last Dose  . acetaminophen (TYLENOL) tablet 650 mg  650 mg Oral Q6H PRN Sanjuana Kava, NP   650 mg at 08/09/15 0108  . alum & mag hydroxide-simeth (MAALOX/MYLANTA) 200-200-20 MG/5ML suspension 30 mL  30 mL Oral Q4H PRN Sanjuana Kava, NP      . cephALEXin (KEFLEX) capsule 500 mg  500 mg Oral Q8H Keaden Gunnoe, MD   500 mg at 08/15/15 0803  . feeding supplement (ENSURE ENLIVE) (ENSURE ENLIVE) liquid 237 mL  237 mL Oral Daily PRN [name] Longs, MD      . hydrOXYzine (ATARAX/VISTARIL) tablet 25 mg  25 mg Oral Q6H PRN Patrena Santalucia, MD      . lithium carbonate capsule 150 mg  150 mg Oral BID WC Hershal Eriksson, MD      . magnesium hydroxide (MILK OF MAGNESIA) suspension 30 mL  30 mL Oral Daily PRN Sanjuana Kava, NP   30 mL at 08/12/15 1716  . OLANZapine zydis (ZYPREXA) disintegrating tablet 5 mg  5 mg Oral TID PRN [name] Longs, MD  Or  . OLANZapine (ZYPREXA) injection 5 mg  5 mg Intramuscular TID PRN [name] Longs, MD      . Oxcarbazepine (TRILEPTAL) tablet 300 mg  300 mg Oral BID [name] Longs, MD   300 mg at 08/15/15 0804  . traZODone (DESYREL) tablet 125 mg  125 mg Oral QHS [name] Longs, MD   125 mg at 08/14/15 2133  . ziprasidone (GEODON) capsule 60 mg  60 mg Oral BID WC [name] Longs, MD   60 mg at 08/15/15 0804   Lab Results:  No results found for this or any previous visit (from the past 48 hour(s)).  Blood Alcohol level:  Lab Results  Component Value Date   ETH <5 08/03/2015   ETH <5 07/25/2015   Physical Findings: AIMS: Facial and Oral Movements Muscles of Facial Expression: None, normal Lips and Perioral Area: None, normal Jaw: None,  normal Tongue: None, normal,Extremity Movements Upper (arms, wrists, hands, fingers): None, normal Lower (legs, knees, ankles, toes): None, normal, Trunk Movements Neck, shoulders, hips: None, normal, Overall Severity Severity of abnormal movements (highest score from questions above): None, normal Incapacitation due to abnormal movements: None, normal Patient's awareness of abnormal movements (rate only patient's report): No Awareness, Dental Status Current problems with teeth and/or dentures?: No Does patient usually wear dentures?: No  CIWA:  CIWA-Ar Total: 0 COWS:  COWS Total Score: 0  Musculoskeletal: Strength & Muscle Tone: within normal limits Gait & Station: normal Patient leans: N/A  Psychiatric Specialty Exam: Physical Exam  Nursing note and vitals reviewed. Constitutional: She is oriented to person, place, and time. She appears well-developed.  Neck: Normal range of motion.  Musculoskeletal: Normal range of motion.  Neurological: She is alert and oriented to person, place, and time.  Skin: Skin is dry.  Psychiatric: She has a normal mood and affect. Her behavior is normal.    Review of Systems  Psychiatric/Behavioral: Positive for depression. Negative for suicidal ideas. The patient is nervous/anxious.   All other systems reviewed and are negative.  denies chest pain, no shortness of breath, no vomiting, no rash , at this time denies dysuria, denies frequency, no fever or chills  Blood pressure 118/77, pulse 88, temperature 98.4 F (36.9 C), temperature source Oral, resp. rate 20, height 5' 4.02" (1.626 m), weight 90.447 kg (199 lb 6.4 oz), last menstrual period 08/05/2015, SpO2 98 %.Body mass index is 34.21 kg/(m^2).  General Appearance: Guarded  Eye Contact:  None  Speech:  Slow  Volume:  Normal  Mood:  Anxious and Depressed  Affect:  Constricted  Thought Process:  Linear and Descriptions of Associations: Intact  Orientation:  Full (Time, Place, and Person)   Thought Content:  Rumination  Suicidal Thoughts:  No denies any suicidal ideations, denies any self injurious ideations ; able to contract for safety- however is depressed   Homicidal Thoughts:  No  Memory:  recent and remote grossly intact immediate - fair  Judgement:  Fair yet improving  Insight:  Good  Psychomotor Activity:  Restlessness  Concentration:  Concentration: Fair and Attention Span: Fair  Recall:  Good  Fund of Knowledge:  Good  Language:  Good  Akathisia:  Negative  Handed:  Right  AIMS (if indicated):   no akathisia, no abnormal involuntary movements   Assets:  Desire for Improvement Resilience  ADL's:  Intact  Cognition:  WNL  Sleep:  Number of Hours: 6.75      Treatment Plan Summary: Jessica [name] is depressed , withdrawn to her room ,  avoids eye contact , difficult to engage with , his thought process is concrete , she is tolerating her medications well. Discussed adding Li to augment the other medications. She agrees.  Daily contact with patient to assess and evaluate symptoms and progress in treatment, Medication management, Plan inpatient admission  and medications as below  Will continue Geodon to 60 mg po BID for psychosis Will continue Trileptal 300 mg po bid for mood sx. Trileptal was initiated in the ED. Will add Lithium 150 mg po bid for augmenting the effect of Geodon. Li level in 5 days . Discussed ADRs of Li as well as the need for proper hydration. Will continue Trazodone125 mg po qhs for sleep. Will continue Keflex 500 mg po tid for UTI.- improving symptoms  Will make available prn medications as per agitation protocol. Will continue to monitor vitals ,medication compliance and treatment side effects while patient is here.  Will monitor for medical issues as well as call consult as needed.  CSW will continue working on disposition.  Recreational therapy consult.see notes . Patient to participate in therapeutic milieu. Vanna Shavers,  MD 08/15/2015, 12:44 PM

## 2015-08-15 NOTE — Progress Notes (Signed)
Pt is moved back to 500 unit, no problem encounter. Pt tolerated well, will keep on monitoring.

## 2015-08-15 NOTE — Tx Team (Signed)
Interdisciplinary Treatment Plan Update (Adult) Date: 08/15/2015   Date: 08/15/2015 8:31 AM  Progress in Treatment:  Attending groups: No Participating in groups: No Taking medication as prescribed: Yes  Tolerating medication: Yes  Family/Significant other contact made: No, but have left messages for omother Patient understands diagnosis: Yes AEB seeking help with depression and hallucinations Discussing patient identified problems/goals with staff: Yes  Medical problems stabilized or resolved: Yes  Denies suicidal/homicidal ideation: Yes Patient has not harmed self or Others: Yes   New problem(s) identified: None identified at this time.   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    08/10/15: Pt is alert/oriented x4, however is anxious , tearful , speech is appropriate, but volume is soft . Pt reports having an episode last night when she was having AH - which she could not elaborate . However pt started crying when she started talking about it. Pt seems to be distressed by these voices. Pt also reports sleep as disrupted due to her having AH. Will increase Geodon to 60 mg po BID for psychosis - titrate higher slowly. EKG for qtc - wnl  Will continue Trileptal 300 mg po bid for mood sx. Trileptal was initiated in the ED. Will increase Trazodone to 125 mg po qhs for sleep. Continue Keflex 500 mg po tid for UTI.   08/15/15: Jessica [name] is depressed , withdrawn to her room , avoids eye contact , difficult to engage with , his thought process is concrete , she is tolerating her  medications well. Discussed adding Li to augment the other medications. She agrees.  Will continue Geodon to 60 mg po BID for psychosis Will continue Trileptal 300 mg po bid for mood sx. Trileptal was initiated in the ED. Will add Lithium 150 mg po bid for augmenting the effect of Geodon. Li level in 5 days . Discussed ADRs of Li as well as the need for proper hydration. Will continue Trazodone125 mg po qhs for sleep. Will continue Keflex 500 mg po tid for UTI.- improving symptoms   Reason for Continuation of Hospitalization:  Anxiety  Depression Medication stabilization Auditory Hallucinations  Estimated length of stay: 4-5 days  Review of initial/current patient goals per problem list:   1.  Goal(s): Patient will participate in aftercare plan  Met:  Yes  Target date: 3-5 days from date of admission   As evidenced by: Patient will participate within aftercare plan AEB aftercare provider and housing plan at discharge being identified.   07/26/15: Pt will return home and follow-up with outpatient providers   2.  Goal (s): Patient will exhibit decreased depressive symptoms and suicidal ideations.  Met:  No  Target date: 3-5 days from date of admission   As evidenced by: Patient will utilize self rating of depression at 3 or below and demonstrate decreased signs of depression or be deemed stable for discharge by MD. 08/05/15: Pt was admitted with symptoms of depression, rating 10/10.  08/10/15:  Rates depression a 5 today 07/1915:  Same rating today  3.  Goal(s): Patient will demonstrate decreased signs and symptoms of  anxiety.  Met:  Yes  Target date: 3-5 days from date of admission   As evidenced by: Patient will utilize self rating of anxiety at 3 or below and demonstrated decreased signs of anxiety, or be deemed stable for discharge by MD 08/05/15;  Rates anxiety a 7 today 08/10/15:  Rates anxiety a 2 today  5.  Goal(s): Patient will demonstrate decreased signs of psychosis   . Met:  No . Target date: 3-5 days from date of admission  . As evidenced by: Patient will demonstrate decreased frequency of AVH or return to baseline function   -08/05/15 Pt endorses auditory hallucinations.   08/10/15:  Symptoms persist   08/15/15:  Symptoms persist-still spending a lot of time in bed isolating, poor group   attendance, not showering.  Attendees:  Patient:    Family:    Physician: Ursula Alert  08/15/2015 8:31 AM  Nursing: Jeanie Cooks  08/15/2015 8:31 AM  Clinical Social Worker Rod Bucks 08/15/2015 8:31 AM  Other:  08/15/2015 8:31 AM  Clinical:   08/15/2015 8:31 AM  Other: , RN Charge Nurse 08/15/2015 8:31 AM  Other: Hilda Lias, P4CC

## 2015-08-15 NOTE — Progress Notes (Signed)
Recreation Therapy Notes  06.19.2017 approximately 3:20pm. Patient reports she is overwhelmed by resources provided to her. Patient described that she does not feel she can participate in them all at this time. LRT assured patient she did not have to participate in all resources at this time, but they were provided for patient education. Patient appeared eased by this. Patient asked if she could use the [place] 1ce a month, LRT endorsed patient plans. '  Jessica [name], LRT/CTRS         Jearl KlinefelterBlanchfield, Jessica Leifheit L 08/15/2015 3:44 PM

## 2015-08-15 NOTE — Progress Notes (Signed)
D:Patient in her room with eyes closed on approach.  Patient is awake but will not look at Clinical research associatewriter.  Patient states her day was slighlty better than yesterday.  Patient denies SI/HI and verbally contracts for safety.  Patient states she hears voices but they are getting lower.  Patient refused all medications tonight.  Patient states, "I will restart everything tomorrow."  A: Staff to monitor Q 15 mins for safety.  Encouragement and support offered. No Scheduled medications administered per orders because patient refused. R: Patient remains safe on the unit.  Patient did not attend group tonight.  Patient not visible on the unit tonight.  Patient refused scheduled medications.

## 2015-08-15 NOTE — Progress Notes (Signed)
DAR NOTE: Pt has been in the room the whole day, pt present with depressed mood. Pt stated she had a rough time last night because she was dealing with some issue she did not want to talk about. Pt denied physical pain, refuse her 1200 o'clock  KEFLEX. As per self inventory, pt had a fair night sleep, good appetite, low energy, and poor concentration. Pt rate depression at 4, hopeless ness at 0, and anxiety at 3. Pt's safety ensured with 15 minute and environmental checks. Pt currently denies SI/HI and A/V hallucinations. Pt verbally agrees to seek staff if SI/HI or A/VH occurs and to consult with staff before acting on these thoughts. Will continue POC.

## 2015-08-16 MED ORDER — ZIPRASIDONE HCL 80 MG PO CAPS
80.0000 mg | ORAL_CAPSULE | Freq: Two times a day (BID) | ORAL | Status: DC
  Administered 2015-08-16: 80 mg via ORAL
  Filled 2015-08-16 (×3): qty 1
  Filled 2015-08-16: qty 2
  Filled 2015-08-16: qty 1

## 2015-08-16 MED ORDER — DOXEPIN HCL 10 MG PO CAPS
10.0000 mg | ORAL_CAPSULE | Freq: Every day | ORAL | Status: DC
  Filled 2015-08-16 (×2): qty 1

## 2015-08-16 NOTE — [facility] Group Notes (Signed)
[facility] LCSW Group Therapy  08/16/2015 , 2:05 PM   Type of Therapy:  Group Therapy  Participation Level:  Active  Participation Quality:  Attentive  Affect:  Appropriate  Cognitive:  Alert  Insight:  Improving  Engagement in Therapy:  Engaged  Modes of Intervention:  Discussion, Exploration and Socialization  Summary of Progress/Problems: Today's group focused on the term Diagnosis.  Participants were asked to define the term, and then pronounce whether it is a negative, positive or neutral term. Invited.  Chose to not attend. [name], Chemere Steffler B 08/16/2015 , 2:05 PM

## 2015-08-16 NOTE — Progress Notes (Signed)
Recreation Therapy Notes  Animal-Assisted Activity (AAA) Program Checklist/Progress Notes Patient Eligibility Criteria Checklist & Daily Group note for Rec Tx Intervention  Date: 06.20.2017 Time: 2:45pm [address]    AAA/T Program Assumption of Risk Form signed by Patient/ or Parent Legal Guardian NO  Behavioral Response: Did not attend. Patient discussed with MD for appropriateness in pet therapy session. Both LRT and MD agree patient is appropriate for participation. Patient offered attendance to session, patient declined. Patient was approached in her room, she appeared tearful, was kneeling with hands pressed together as if she was praying. LRT asked if patient was praying, she responded by stating "I'm buddhist." Patient rose from floor at this time, patient appeared to be crying and was shaking. Patient declined invitation to attend pet therapy session politely and was observed to get in bed.   Marykay Lexenise L Maggie Dworkin, LRT/CTRS         Jearl KlinefelterBlanchfield, Justice Aguirre L 08/16/2015 3:35 PM

## 2015-08-16 NOTE — Progress Notes (Signed)
DAR NOTE: Patient is labile, tearful and anxious.  Patient refused to take all morning medications after several attempts and encouragement.  Patient only agreed to take Geodon after Md assessment and encouragement.  Patient reports having premonition about mother's death.  Patient states "she is going to have another prednisone breakdown if she continues to take medication."  Patient educated on the importance of medication adherence, benefit, and side effect.  Patient mood and affect remained depressed. Patient encouraged to come out of her room but refused.  Food and fluid intake encouraged.  Routine safety checks maintained.

## 2015-08-16 NOTE — Progress Notes (Signed)
[facility] MD Progress Note  08/16/2015 12:46 PM Jessica [name]  MRN:  [id number]  Subjective:  Patient reports, "I am on a lot of medications and I am worried I will have another prednisone psychosis.'     Objective: [name] is awake, and oriented X4. Pt seen in bed , avoided eye contact , and when was asked to get up and go to the group room , was observed as getting up and walking with eyes closed.  Pt seen as labile , tearful and anxious . Pt unable to participate fully in evaluation - seen as preoccupied with taking a lot of medications. Pt later on agreed to take only geodon and RN was advised to give it to her .She also has sleep issues - reports trazodone as not effective.  Principal Problem: Schizoaffective disorder, bipolar type (HCC) superimposed by possible delirium 2/2 UTI Diagnosis:   Patient Active Problem List   Diagnosis Date Noted  . Delirium due to another medical condition [F05] 08/05/2015  . UTI (lower urinary tract infection) [N39.0]   . Schizoaffective disorder, bipolar type (HCC) [F25.0] 07/25/2015   Total Time spent with patient: 30 minutes  Past Medical History: Please see H&P.  Past Medical History  Diagnosis Date  . Bipolar 1 disorder (HCC)    History reviewed. No pertinent past surgical history. Family History:  Family History  Problem Relation Age of Onset  . Hypertension Mother   . Mental illness Father    Social History: Please see H&P.  History  Alcohol Use No     History  Drug Use No    Social History   Social History  . Marital Status: Single    Spouse Name: N/A  . Number of Children: N/A  . Years of Education: N/A   Social History Main Topics  . Smoking status: Never Smoker   . Smokeless tobacco: None  . Alcohol Use: No  . Drug Use: No  . Sexual Activity: No   Other Topics Concern  . None   Social History Narrative   Additional Social History:   Sleep: Poor  Appetite:  observed as poor- needs encouragement     Current Medications: Current Facility-Administered Medications  Medication Dose Route Frequency Provider Last Rate Last Dose  . acetaminophen (TYLENOL) tablet 650 mg  650 mg Oral Q6H PRN Sanjuana Kava, NP   650 mg at 08/09/15 0108  . alum & mag hydroxide-simeth (MAALOX/MYLANTA) 200-200-20 MG/5ML suspension 30 mL  30 mL Oral Q4H PRN Sanjuana Kava, NP      . cephALEXin (KEFLEX) capsule 500 mg  500 mg Oral Q8H Bryceton Hantz, MD   500 mg at 08/15/15 0803  . feeding supplement (ENSURE ENLIVE) (ENSURE ENLIVE) liquid 237 mL  237 mL Oral Daily PRN [name] Longs, MD      . hydrOXYzine (ATARAX/VISTARIL) tablet 25 mg  25 mg Oral Q6H PRN [name] Longs, MD      . lithium carbonate capsule 150 mg  150 mg Oral BID WC [name] Longs, MD   150 mg at 08/15/15 1812  . magnesium hydroxide (MILK OF MAGNESIA) suspension 30 mL  30 mL Oral Daily PRN Sanjuana Kava, NP   30 mL at 08/12/15 1716  . OLANZapine zydis (ZYPREXA) disintegrating tablet 5 mg  5 mg Oral TID PRN [name] Longs, MD       Or  . OLANZapine (ZYPREXA) injection 5 mg  5 mg Intramuscular TID PRN [name] Longs, MD      .  Oxcarbazepine (TRILEPTAL) tablet 300 mg  300 mg Oral BID [name] LongsSaramma Asusena Sigley, MD   300 mg at 08/15/15 0804  . traZODone (DESYREL) tablet 125 mg  125 mg Oral QHS [name] LongsSaramma Jazilyn Siegenthaler, MD   125 mg at 08/14/15 2133  . ziprasidone (GEODON) capsule 60 mg  60 mg Oral BID WC [name] LongsSaramma Anayeli Arel, MD   60 mg at 08/15/15 0804   Lab Results:  No results found for this or any previous visit (from the past 48 hour(s)).  Blood Alcohol level:  Lab Results  Component Value Date   ETH <5 08/03/2015   ETH <5 07/25/2015   Physical Findings: AIMS: Facial and Oral Movements Muscles of Facial Expression: None, normal Lips and Perioral Area: None, normal Jaw: None, normal Tongue: None, normal,Extremity Movements Upper (arms, wrists, hands, fingers): None, normal Lower (legs, knees, ankles, toes): None, normal, Trunk Movements Neck, shoulders, hips: None,  normal, Overall Severity Severity of abnormal movements (highest score from questions above): None, normal Incapacitation due to abnormal movements: None, normal Patient's awareness of abnormal movements (rate only patient's report): No Awareness, Dental Status Current problems with teeth and/or dentures?: No Does patient usually wear dentures?: No  CIWA:  CIWA-Ar Total: 0 COWS:  COWS Total Score: 0  Musculoskeletal: Strength & Muscle Tone: within normal limits Gait & Station: normal Patient leans: N/A  Psychiatric Specialty Exam: Physical Exam  Nursing note and vitals reviewed. Constitutional: She is oriented to person, place, and time. She appears well-developed.  Neck: Normal range of motion.  Musculoskeletal: Normal range of motion.  Neurological: She is alert and oriented to person, place, and time.  Skin: Skin is dry.  Psychiatric: She has a normal mood and affect. Her behavior is normal.    Review of Systems  Psychiatric/Behavioral: Positive for depression. Negative for suicidal ideas. The patient is nervous/anxious.   All other systems reviewed and are negative.  denies chest pain, no shortness of breath, no vomiting, no rash , at this time denies dysuria, denies frequency, no fever or chills  Blood pressure 144/88, pulse 93, temperature 98.4 F (36.9 C), temperature source Oral, resp. rate 20, height 5' 4.02" (1.626 m), weight 90.447 kg (199 lb 6.4 oz), last menstrual period 08/05/2015, SpO2 98 %.Body mass index is 34.21 kg/(m^2).  General Appearance: Guarded  Eye Contact:  None  Speech:  Slow  Volume:  Normal  Mood:  Anxious and Depressed  Affect:  Constricted  Thought Process:  Linear and Descriptions of Associations: Intact  Orientation:  Full (Time, Place, and Person)  Thought Content: AH - [facility] - does not elaborate on them.  Suicidal Thoughts:  No denies any suicidal ideations, denies any self injurious ideations ; able to  contract for safety- however is depressed   Homicidal Thoughts:  No  Memory:  recent and remote grossly intact immediate - fair  Judgement:  Fair   Insight: poor  Psychomotor Activity:  Restlessness  Concentration:  Concentration: Fair and Attention Span: Fair  Recall:  Good  Fund of Knowledge:  Good  Language:  Good  Akathisia:  Negative  Handed:  Right  AIMS (if indicated):   no akathisia, no abnormal involuntary movements   Assets:  Desire for Improvement Resilience  ADL's:  Intact  Cognition:  WNL  Sleep:  Number of Hours: 5.75      Treatment Plan Summary: [name] is depressed , withdrawn to her room , avoids eye contact , difficult to engage with , his thought  process is concrete , she is not taking any of her medications , but after medication education today - agreed to take geodon. Will continue treatment.  Daily contact with patient to assess and evaluate symptoms and progress in treatment, Medication management, Plan inpatient admission  and medications as below  Will increase Geodon to 80 mg po BID for psychosis Will continue Trileptal 300 mg po bid for mood sx. Trileptal was initiated in the ED. Will continue Lithium 150 mg po bid for augmenting the effect of Geodon. Li level in 5 days . Discussed ADRs of Li as well as the need for proper hydration. Will discontinue Trazodone, start Doxepin 10 mg po qhs for sleep. Will continue Keflex 500 mg po tid for UTI.- improving symptoms  Will make available prn medications as per agitation protocol. Will continue to monitor vitals ,medication compliance and treatment side effects while patient is here.  Will monitor for medical issues as well as call consult as needed.  CSW will continue working on disposition.  Recreational therapy consult.see notes . Patient to participate in therapeutic milieu. Ameen Mostafa, MD 08/16/2015, 12:46 PM

## 2015-08-16 NOTE — [facility] Group Notes (Signed)
[facility] Group Notes:  (Nursing/MHT/Case Management/Adjunct)  Date:  08/16/2015  Time:  3:07 PM  Type of Therapy:  Nurse Education  Participation Level:  Did not attend.     Mickie Baillizabeth O Iwenekha 08/16/2015, 3:07 PM

## 2015-08-16 NOTE — Progress Notes (Signed)
Patient ID: [name], female   DOB: 10/28/1975, 40 y.o.   MRN: [id number]  Adult Psychoeducational Group Note  Date:  08/16/2015 Time:09:30am  Group Topic/Focus:  Recovery Goals:   The focus of this group is to identify appropriate goals for recovery and establish a plan to achieve them.  Participation Level:  Did Not Attend  Participation Quality:  n/a  Affect:  n/a  Cognitive: n/a  Insight: n/a  Engagement in Group: n/a  Modes of Intervention:  n/a  Additional Comments:  Pt did not attend group, pt in bed asleep.   Aurora Maskwyman, Thereasa Iannello E 08/16/2015, 10:20 AM

## 2015-08-16 NOTE — Progress Notes (Signed)
Adult Psychoeducational Group Note  Date:  08/16/2015 Time:  8:39 PM  Group Topic/Focus:  Wrap-Up Group:   The focus of this group is to help patients review their daily goal of treatment and discuss progress on daily workbooks.  Participation Level:  Did Not Attend  Participation Quality:  Did not attend  Affect:  Did not attend  Cognitive:  Did not attend  Insight: None  Engagement in Group:  Did not attend  Modes of Intervention:  Did not attend  Additional Comments:  Patient did not attend wrap up group tonight.   Jessica [name] [name] 08/16/2015, 8:39 PM

## 2015-08-17 MED ORDER — OLANZAPINE 5 MG PO TABS
5.0000 mg | ORAL_TABLET | Freq: Every day | ORAL | Status: DC
  Filled 2015-08-17 (×3): qty 1

## 2015-08-17 MED ORDER — LORAZEPAM 0.5 MG PO TABS
0.5000 mg | ORAL_TABLET | Freq: Two times a day (BID) | ORAL | Status: DC
  Administered 2015-08-18: 0.5 mg via ORAL
  Filled 2015-08-17: qty 1

## 2015-08-17 MED ORDER — OLANZAPINE 10 MG IM SOLR: 5.0000 mg | Freq: Two times a day (BID) | INTRAMUSCULAR | Status: DC | PRN

## 2015-08-17 MED ORDER — ZIPRASIDONE HCL 40 MG PO CAPS
40.0000 mg | ORAL_CAPSULE | Freq: Two times a day (BID) | ORAL | Status: DC
  Administered 2015-08-18: 40 mg via ORAL
  Filled 2015-08-17 (×6): qty 1

## 2015-08-17 MED ORDER — DOXEPIN HCL 10 MG PO CAPS
20.0000 mg | ORAL_CAPSULE | Freq: Every day | ORAL | Status: DC
  Administered 2015-08-18 – 2015-08-22 (×5): 20 mg via ORAL
  Filled 2015-08-17 (×7): qty 2

## 2015-08-17 MED ORDER — OLANZAPINE 5 MG PO TBDP: 5.0000 mg | ORAL_TABLET | Freq: Two times a day (BID) | ORAL | Status: DC | PRN

## 2015-08-17 NOTE — Progress Notes (Signed)
DAR NOTE: Pt present with depressed mood. Pt stays in the room most of the time and not interacting much. Pt stated she had a rough night but was not willing to talk about it. Pt refused all her morning medications stating, " I do not need to take any medication, her are too much for my system." Pt denied physical pain.  As per self inventory, pt had a poor night sleep, poor appetite, low energy, and poor concentration. Pt rate depression at 4, hopeless ness at 0, and anxiety at 2. Pt's safety ensured with 15 minute and environmental checks. Pt currently denies SI/HI and A/V hallucinations. Pt verbally agrees to seek staff if SI/HI or A/VH occurs and to consult with staff before acting on these thoughts. Will continue POC.

## 2015-08-17 NOTE — Progress Notes (Signed)
[facility]  08/17/2015 11:56 AM Jessica ChaletJennifer L [name]  MRN:  [id number]  Subjective:  Patient reports, "I am very sensitive to medications , I still hear voices and I had another episode of hearing voices and being psychotic again the last two days. I feel depressed and things were coming back to me.'      Objective: Jessica [name] is awake, and oriented X4. Pt seen in bed , was able to sit up and talk to writer better than yesterday , she had better eye contact and answered questions appropriately. Pt continued to have some tearfulness when writer evaluated her , but could not elaborate on why she feels sad. Pt per staff has not been eating well and needs a lot of encouragement to take her medications . Per staff - pt has been skipping her doses of geodon and has been refusing her mood stabilizers. Pt today reports she is very worried about being on a lot of medications and keeps thinking she may have another prednisone psychosis like episode again . Discussed with her that geodon may not be absorbed if she were not taking it with meals , she agrees to be started on Zyprexa . Will start the trial today.    Principal Problem: Schizoaffective disorder, bipolar type (HCC) Diagnosis:   Patient Active Problem List   Diagnosis Date Noted  . Delirium due to another medical condition [F05] 08/05/2015  . UTI (lower urinary tract infection) [N39.0]   . Schizoaffective disorder, bipolar type (HCC) [F25.0] 07/25/2015   Total Time spent with patient: 30 minutes  Past Medical History: Please see H&P.  Past Medical History  Diagnosis Date  . Bipolar 1 disorder (HCC)    History reviewed. No pertinent past surgical history. Family History:  Family History  Problem Relation Age of Onset  . Hypertension Mother   . Mental illness Father    Social History: Please see H&P.  History  Alcohol Use No     History  Drug Use No    Social History   Social History  . Marital Status:  Single    Spouse Name: N/A  . Number of Children: N/A  . Years of Education: N/A   Social History Main Topics  . Smoking status: Never Smoker   . Smokeless tobacco: None  . Alcohol Use: No  . Drug Use: No  . Sexual Activity: No   Other Topics Concern  . None   Social History Narrative   Additional Social History:   Sleep: Poor  Appetite:  observed as poor- needs encouragement    Current Medications: Current Facility-Administered Medications  Medication Dose Route Frequency Provider Last Rate Last Dose  . acetaminophen (TYLENOL) tablet 650 mg  650 mg Oral Q6H PRN Sanjuana KavaAgnes I Nwoko, NP   650 mg at 08/09/15 0108  . alum & mag hydroxide-simeth (MAALOX/MYLANTA) 200-200-20 MG/5ML suspension 30 mL  30 mL Oral Q4H PRN Sanjuana KavaAgnes I Nwoko, NP      . cephALEXin (KEFLEX) capsule 500 mg  500 mg Oral Q8H Farron Lafond, MD   500 mg at 08/15/15 0803  . doxepin (SINEQUAN) capsule 10 mg  10 mg Oral QHS Melisssa Donner, MD   10 mg at 08/16/15 2200  . feeding supplement (ENSURE ENLIVE) (ENSURE ENLIVE) liquid 237 mL  237 mL Oral Daily PRN [name] LongsSaramma Keniah Klemmer, MD      . hydrOXYzine (ATARAX/VISTARIL) tablet 25 mg  25 mg Oral Q6H PRN [name] LongsSaramma Quentin Strebel, MD      .  magnesium hydroxide (MILK OF MAGNESIA) suspension 30 mL  30 mL Oral Daily PRN Sanjuana Kava, NP   30 mL at 08/12/15 1716  . OLANZapine zydis (ZYPREXA) disintegrating tablet 5 mg  5 mg Oral BID PRN [name] Longs, MD       Or  . OLANZapine (ZYPREXA) injection 5 mg  5 mg Intramuscular BID PRN Jannetta Massey, MD      . OLANZapine (ZYPREXA) tablet 5 mg  5 mg Oral QHS Tatym Schermer, MD      . ziprasidone (GEODON) capsule 40 mg  40 mg Oral BID WC Eiman Maret, MD       Lab Results:  No results found for this or any previous visit (from the past 48 hour(s)).  Blood Alcohol level:  Lab Results  Component Value Date   ETH <5 08/03/2015   ETH <5 07/25/2015   Physical Findings: AIMS: Facial and Oral Movements Muscles of Facial Expression: None, normal Lips  and Perioral Area: None, normal Jaw: None, normal Tongue: None, normal,Extremity Movements Upper (arms, wrists, hands, fingers): None, normal Lower (legs, knees, ankles, toes): None, normal, Trunk Movements Neck, shoulders, hips: None, normal, Overall Severity Severity of abnormal movements (highest score from questions above): None, normal Incapacitation due to abnormal movements: None, normal Patient's awareness of abnormal movements (rate only patient's report): No Awareness, Dental Status Current problems with teeth and/or dentures?: No Does patient usually wear dentures?: No  CIWA:  CIWA-Ar Total: 0 COWS:  COWS Total Score: 0  Musculoskeletal: Strength & Muscle Tone: within normal limits Gait & Station: normal Patient leans: N/A  Psychiatric Specialty Exam: Physical Exam  Nursing note and vitals reviewed. Constitutional: She is oriented to person, place, and time. She appears well-developed.  Neck: Normal range of motion.  Musculoskeletal: Normal range of motion.  Neurological: She is alert and oriented to person, place, and time.  Skin: Skin is dry.  Psychiatric: She has a normal mood and affect. Her behavior is normal.    Review of Systems  Psychiatric/Behavioral: Positive for depression. Negative for suicidal ideas. The patient is nervous/anxious.   All other systems reviewed and are negative.  denies chest pain, no shortness of breath, no vomiting, no rash , at this time denies dysuria, denies frequency, no fever or chills  Blood pressure 128/85, pulse 107, temperature 98.4 F (36.9 C), temperature source Oral, resp. rate 18, height 5' 4.02" (1.626 m), weight 90.447 kg (199 lb 6.4 oz), last menstrual period 08/05/2015, SpO2 98 %.Body mass index is 34.21 kg/(m^2).  General Appearance: Guarded  Eye Contact:  Fair  Speech:  Slow  Volume:  Normal  Mood:  Anxious and Depressed  Affect:  Tearful  Thought Process:  Linear and Descriptions of Associations: Intact   Orientation:  Full (Time, Place, and Person)  Thought Content: AH - reports she is in distress from the [facility] - does not elaborate on them.  Suicidal Thoughts:  No denies any suicidal ideations, denies any self injurious ideations ; able to contract for safety- however is depressed   Homicidal Thoughts:  No  Memory:  recent and remote grossly intact immediate - fair  Judgement:  Fair   Insight: poor  Psychomotor Activity:  Restlessness  Concentration:  Concentration: Fair and Attention Span: Fair  Recall:  Good  Fund of Knowledge:  Good  Language:  Good  Akathisia:  Negative  Handed:  Right  AIMS (if indicated):   no akathisia, no abnormal involuntary movements   Assets:  Desire for Improvement  Resilience  ADL's:  Intact  Cognition:  WNL  Sleep:  Number of Hours: 4      Treatment Plan Summary: Jessica [name] is depressed , withdrawn to her room mostly , is tearful at times , has sleep issues . She is also not eating well , will readjust her medications - see below for changes made.    Daily contact with patient to assess and evaluate symptoms and progress in treatment, Medication management, Plan inpatient admission  and medications as below  Will cross titrate Geodon with Zyprexa - for mood sx/psychosis. The change has been made since pt is not making much progress inspite of being on it for the last 10 days - she is also not eating well which can affect the bioavailability of geodon. Will discontinue Trileptal and Lithium since pt is refusing to take it. Will increase Doxepin to 20 mg po qhs for sleep. Will make available prn medications as per agitation protocol. Will continue to monitor vitals ,medication compliance and treatment side effects while patient is here.  Will monitor for medical issues as well as call consult as needed.  CSW will continue working on disposition.  Recreational therapy consult.see notes . Patient to participate in therapeutic milieu. Wrigley Winborne,  MD 08/17/2015, 11:56 AM

## 2015-08-17 NOTE — [facility] Group Notes (Signed)
[facility]  08/17/2015 , 1:09 PM    Type of Therapy:  [employer] Presentation  Participation Level:  Active  Participation Quality:  Attentive  Affect:  Blunted  Cognitive:  Oriented  Insight:  Limited  Engagement in Therapy:  Engaged  Modes of Intervention:  Discussion, Education and Socialization  Summary of Progress/Problems:  Onalee HuaDavid from [employer] came to present his recovery story and play the guitar.  Invited.  Chose to not attend.  [name], Arrayah Connors B 08/17/2015 , 1:09 PM

## 2015-08-17 NOTE — Progress Notes (Signed)
D    Pt has spent the shift so far in bed and does not wake up when her name is called but she does change positions often    A    Will continue to monitor Q 15 min  R   Pt safe at present

## 2015-08-17 NOTE — Progress Notes (Signed)
D   Spoke with patient briefly this evening    She was laying in bed flat on her back with her eyes closed while talking to me   Discussed taking medications with patient    She said she just had a nervous breakdown and the medications werent good for her and she will not take the medications   She is drinking fluids but refuses solid food   Endorses poor appetite A   Verbal support and encouragement offered   Encouraged medication and food  Q 15 min checks R   Pt safe at present

## 2015-08-17 NOTE — Progress Notes (Addendum)
Dis regard this note, wrong pt.

## 2015-08-17 NOTE — Progress Notes (Signed)
Recreation Therapy Notes  06.21.2017 approximately 3:00pm. LRT returned to attempt to engage patient in breathing techniques. Patient politely declined LRT offer. Patient laying in bed, appears as if she has not cleaned her hair in days and is wearing same clothes she was observed to be in yesterday. Patient does not smell of body odor at this time. Patient made no eye contact with LRT during interaction.   Marykay Lexenise L Kaizlee Carlino, LRT/CTRS   Demetrica Zipp L 08/17/2015 4:01 PM

## 2015-08-18 MED ORDER — OLANZAPINE 10 MG IM SOLR
2.5000 mg | Freq: Every day | INTRAMUSCULAR | Status: DC
  Filled 2015-08-18 (×9): qty 10

## 2015-08-18 MED ORDER — OLANZAPINE 2.5 MG PO TABS
2.5000 mg | ORAL_TABLET | Freq: Every day | ORAL | Status: DC
  Administered 2015-08-18 – 2015-08-24 (×7): 2.5 mg via ORAL
  Filled 2015-08-18 (×10): qty 1

## 2015-08-18 MED ORDER — OLANZAPINE 5 MG PO TBDP
5.0000 mg | ORAL_TABLET | Freq: Every day | ORAL | Status: DC
  Administered 2015-08-18: 5 mg via ORAL
  Filled 2015-08-18 (×2): qty 1

## 2015-08-18 MED ORDER — ZIPRASIDONE MESYLATE 20 MG IM SOLR: 10.0000 mg | Freq: Two times a day (BID) | INTRAMUSCULAR | Status: DC | PRN

## 2015-08-18 MED ORDER — ZIPRASIDONE HCL 20 MG PO CAPS: 20.0000 mg | ORAL_CAPSULE | Freq: Two times a day (BID) | ORAL | Status: DC | PRN

## 2015-08-18 MED ORDER — LORAZEPAM 2 MG/ML IJ SOLN: 0.5000 mg | INTRAMUSCULAR | Status: DC

## 2015-08-18 MED ORDER — OLANZAPINE 10 MG IM SOLR
5.0000 mg | Freq: Every day | INTRAMUSCULAR | Status: DC
  Filled 2015-08-18 (×2): qty 10

## 2015-08-18 MED ORDER — LORAZEPAM 0.5 MG PO TABS
0.5000 mg | ORAL_TABLET | ORAL | Status: DC
  Administered 2015-08-18 – 2015-08-23 (×10): 0.5 mg via ORAL
  Filled 2015-08-18 (×10): qty 1

## 2015-08-18 NOTE — Progress Notes (Signed)
DAR NOTE: Patient remained in her room in bed with her eyes closed.  Appetite and fluid intake remained poor.  Patient encouraged to come out of her room to participate in milieu. Medications given as prescribed.  Patient offered support and encouragement as needed.  Routine safety checks maintained.  Denies auditory and visual hallucinations.

## 2015-08-18 NOTE — [facility] Group Notes (Signed)
[facility] Group Notes:  (Counselor/Nursing/MHT/Case Management/Adjunct)  08/18/2015 1:15PM  Type of Therapy:  Group Therapy  Participation Level:  Active  Participation Quality:  Appropriate  Affect:  Flat  Cognitive:  Oriented  Insight:  Improving  Engagement in Group:  Limited  Engagement in Therapy:  Limited  Modes of Intervention:  Discussion, Exploration and Socialization  Summary of Progress/Problems: The topic for group was balance in life.  Pt participated in the discussion about when their life was in balance and out of balance and how this feels.  Pt discussed ways to get back in balance and short term goals they can work on to get where they want to be.  Invited.  Chose to not attend.  [name], Bettyjean Stefanski B 08/18/2015 2:45 PM

## 2015-08-18 NOTE — Progress Notes (Signed)
Pt did not attend karaoke group.  

## 2015-08-18 NOTE — Progress Notes (Signed)
Recreation Therapy Notes  06.22.2017 approximately 2:30pm. LRT returned to engage patient in breathing techniques. Patient observed to be laying in her bed with eyes closed. Patient responded immediately to LRT when her name was called, opening her eyes and greeting LRT. Patient laying in bed with blanket pulled up to her neck. Patient hair is dirty and appears to have not been washed in many days. Patient reports an increase in her stress level, but is unable to identify a catalyst for increased anxiety. Patient agreed to participate in deep breathing with LRT today and participated in deep breathing for approximately 30 seconds with LRT. Patient reports feeling more relaxed at conclusion of deep breathing. LRT inquired about patient hygiene habits. Patient shared she has not showered in approximately 3 days. LRT attempted to investigate why patient has not been showering, patient appeared internally preoccupied and demonstrated thought blocking at this time, she was not able to identify why she has stopped showering during her admission. LRT encouraged patient to tend to her hygiene.   Marykay Lexenise L Dreyson Mishkin, LRT/CTRS   Shanekia Latella L 08/18/2015 3:30 PM

## 2015-08-18 NOTE — Tx Team (Signed)
Interdisciplinary Treatment Plan Update (Adult) Date: 08/18/2015   Date: 08/18/2015 9:15 AM  Progress in Treatment:  Attending groups: No Participating in groups: No Taking medication as prescribed: Yes  Tolerating medication: Yes  Family/Significant other contact made: Yes Patient understands diagnosis: Yes AEB seeking help with depression and hallucinations Discussing patient identified problems/goals with staff: Yes  Medical problems stabilized or resolved: Yes  Denies suicidal/homicidal ideation: Yes Patient has not harmed self or Others: Yes   New problem(s) identified: None identified at this time.   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    08/10/15: Pt is alert/oriented x4, however is anxious , tearful , speech is appropriate, but volume is soft . Pt reports having an episode last night when she was having AH - which she could not elaborate . However pt started crying when she started talking about it. Pt seems to be distressed by these voices. Pt also reports sleep as disrupted due to her having AH. Will increase Geodon to 60 mg po BID for psychosis - titrate higher slowly. EKG for qtc - wnl  Will continue Trileptal 300 mg po bid for mood sx. Trileptal was initiated in the ED. Will increase Trazodone to 125 mg po qhs for sleep. Continue Keflex 500 mg po tid for UTI.   08/15/15: Jessica [name] is depressed , withdrawn to her room , avoids eye contact , difficult to engage with , his thought process is concrete , she is tolerating her medications well. Discussed adding Li to  augment the other medications. She agrees.  Will continue Geodon to 60 mg po BID for psychosis Will continue Trileptal 300 mg po bid for mood sx. Trileptal was initiated in the ED   08/18/15:  .Jessica [name] is depressed , withdrawn to her room mostly , is tearful at times , has sleep issues . She is also not eating well , and is not compliant on medications. Discussed with Dr.Saranga - who has seen patient for a second opinion for forced medications. Will start Zyprexa 5 Mg PO /IM if she refuses PO for psychosis. Pt is on Forced medication order. Discontinued Trileptal and Lithium since pt was refusing it for concerns of side effects. Increased Doxepin to 20 mg po qhs for sleep. Will add Lithium 150 mg po bid for augmenting the effect of Geodon. Li level in 5 days . Discussed ADRs of Li as well as the need for proper hydration. Will continue Trazodone125 mg po qhs for sleep. Will continue Keflex 500 mg po tid for UTI.- improving symptoms    Reason for Continuation of Hospitalization:  Anxiety  Depression Medication stabilization Auditory Hallucinations  Estimated length of stay: 4-5 days  Review of initial/current patient goals per problem list:   1.  Goal(s): Patient will participate in aftercare plan  Met:  Yes  Target date: 3-5 days from date of admission   As evidenced by: Patient will participate within aftercare plan AEB aftercare provider and housing plan at discharge being identified.   07/26/15: Pt will return home and follow-up with outpatient providers   2.  Goal (  s): Patient will exhibit decreased depressive symptoms and suicidal ideations.  Met:  No  Target date: 3-5 days from date of admission   As evidenced by: Patient will utilize self rating of depression at 3 or below and demonstrate decreased signs of depression or be deemed stable for discharge by MD. 08/05/15: Pt was admitted with symptoms of depression, rating 10/10.  08/10/15:  Rates depression a 5  today 07/1915:  Same rating today 08/18/15:  Continues to isolate in room in bed, poor appetite, not taking care of ADL's  3.  Goal(s): Patient will demonstrate decreased signs and symptoms of anxiety.  Met:  Yes  Target date: 3-5 days from date of admission   As evidenced by: Patient will utilize self rating of anxiety at 3 or below and demonstrated decreased signs of anxiety, or be deemed stable for discharge by MD 08/05/15;  Rates anxiety a 7 today 08/10/15:  Rates anxiety a 2 today  5.  Goal(s): Patient will demonstrate decreased signs of psychosis  . Met:  No . Target date: 3-5 days from date of admission  . As evidenced by: Patient will demonstrate decreased frequency of AVH or return to baseline function   -08/05/15 Pt endorses auditory hallucinations.   08/10/15:  Symptoms persist   08/15/15:  Symptoms persist-still spending a lot of time in bed isolating, poor group   attendance, not showering.   08/18/15: Has been refusing meds.  Was IVC'd today, and second opinion to force meds   obtained.   Attendees:  Patient:    Family:    Physician: Ursula Alert  08/18/2015 9:15 AM  Nursing: Hedy Jacob 08/18/2015 9:15 AM  Clinical Social Worker Rod Lakeside 08/18/2015 9:15 AM  Other:  08/18/2015 9:15 AM  Clinical:   08/18/2015 9:15 AM  Other: , RN Charge Nurse 08/18/2015 9:15 AM  Other: Hilda Lias, P4CC

## 2015-08-18 NOTE — Progress Notes (Addendum)
[facility] MD Progress Note  08/18/2015 3:07 PM Jessica [name]  MRN:  [id number]  Subjective:  Patient reports, "I am trying to get some protein in to my system.'       Objective: [name] is awake, and oriented X4. Pt seen in bed ,was observed as eating a chicken piece that was left over in her room from yesterday , stating ' I need to get protein in to my system.' Pt continued to have some tearfulness when writer evaluated her , but could not elaborate on why she feels sad. Pt per staff has not been eating well  and needs a lot of encouragement to take her medications . Per staff - pt continues to not be compliant on her medications, needs a lot of encouragement to get out of bed or take medications.  Discussed with patient that she will be seen by another psychiatrist who will make the decision if she needs to be on forced  Medications.   Principal Problem: Schizoaffective disorder, bipolar type (HCC) Diagnosis:   Patient Active Problem List   Diagnosis Date Noted  . UTI (lower urinary tract infection) [N39.0]   . Schizoaffective disorder, bipolar type (HCC) [F25.0] 07/25/2015   Total Time spent with patient: 30 minutes  Past Medical History: Please see H&P.  Past Medical History  Diagnosis Date  . Bipolar 1 disorder (HCC)    History reviewed. No pertinent past surgical history. Family History:  Family History  Problem Relation Age of Onset  . Hypertension Mother   . Mental illness Father    Social History: Please see H&P.  History  Alcohol Use No     History  Drug Use No    Social History   Social History  . Marital Status: Single    Spouse Name: N/A  . Number of Children: N/A  . Years of Education: N/A   Social History Main Topics  . Smoking status: Never Smoker   . Smokeless tobacco: None  . Alcohol Use: No  . Drug Use: No  . Sexual Activity: No   Other Topics Concern  . None   Social History Narrative   Additional Social History:    Sleep: Poor  Appetite:  observed as poor- needs encouragement    Current Medications: Current Facility-Administered Medications  Medication Dose Route Frequency Provider Last Rate Last Dose  . acetaminophen (TYLENOL) tablet 650 mg  650 mg Oral Q6H PRN Sanjuana Kava, NP   650 mg at 08/09/15 0108  . alum & mag hydroxide-simeth (MAALOX/MYLANTA) 200-200-20 MG/5ML suspension 30 mL  30 mL Oral Q4H PRN Sanjuana Kava, NP      . cephALEXin (KEFLEX) capsule 500 mg  500 mg Oral Q8H Bryann Gentz, MD   500 mg at 08/15/15 0803  . doxepin (SINEQUAN) capsule 20 mg  20 mg Oral QHS Jaylee Lantry, MD   20 mg at 08/17/15 2200  . feeding supplement (ENSURE ENLIVE) (ENSURE ENLIVE) liquid 237 mL  237 mL Oral Daily PRN [name] Longs, MD      . hydrOXYzine (ATARAX/VISTARIL) tablet 25 mg  25 mg Oral Q6H PRN Disha Cottam, MD      . LORazepam (ATIVAN) tablet 0.5 mg  0.5 mg Oral BH-q12n4p [name] Longs, MD       Or  . LORazepam (ATIVAN) injection 0.5 mg  0.5 mg Intramuscular BH-q12n4p Dallis Czaja, MD      . magnesium hydroxide (MILK OF MAGNESIA) suspension 30 mL  30 mL Oral Daily PRN  Sanjuana KavaAgnes I Nwoko, NP   30 mL at 08/12/15 1716  . OLANZapine zydis (ZYPREXA) disintegrating tablet 2.5 mg  2.5 mg Oral QPC breakfast [name] LongsSaramma Axzel [place]hill, MD       Or  . OLANZapine (ZYPREXA) injection 2.5 mg  2.5 mg Intramuscular QPC breakfast Matteo Banke, MD      . OLANZapine zydis (ZYPREXA) disintegrating tablet 5 mg  5 mg Oral QHS [name] LongsSaramma Maripaz Mullan, MD       Or  . OLANZapine (ZYPREXA) injection 5 mg  5 mg Intramuscular QHS Suhan Paci, MD      . ziprasidone (GEODON) capsule 20 mg  20 mg Oral BID PRN [name] LongsSaramma Mattingly Fountaine, MD       Or  . ziprasidone (GEODON) injection 10 mg  10 mg Intramuscular BID PRN [name] LongsSaramma Rudene Poulsen, MD       Lab Results:  No results found for this or any previous visit (from the past 48 hour(s)).  Blood Alcohol level:  Lab Results  Component Value Date   ETH <5 08/03/2015   ETH <5 07/25/2015   Physical  Findings: AIMS: Facial and Oral Movements Muscles of Facial Expression: None, normal Lips and Perioral Area: None, normal Jaw: None, normal Tongue: None, normal,Extremity Movements Upper (arms, wrists, hands, fingers): None, normal Lower (legs, knees, ankles, toes): None, normal, Trunk Movements Neck, shoulders, hips: None, normal, Overall Severity Severity of abnormal movements (highest score from questions above): None, normal Incapacitation due to abnormal movements: None, normal Patient's awareness of abnormal movements (rate only patient's report): No Awareness, Dental Status Current problems with teeth and/or dentures?: No Does patient usually wear dentures?: No  CIWA:  CIWA-Ar Total: 0 COWS:  COWS Total Score: 0  Musculoskeletal: Strength & Muscle Tone: within normal limits Gait & Station: normal Patient leans: N/A  Psychiatric Specialty Exam: Physical Exam  Nursing note and vitals reviewed. Constitutional: She is oriented to person, place, and time. She appears well-developed.  Neck: Normal range of motion.  Musculoskeletal: Normal range of motion.  Neurological: She is alert and oriented to person, place, and time.  Skin: Skin is dry.  Psychiatric: She has a normal mood and affect. Her behavior is normal.    Review of Systems  Psychiatric/Behavioral: Positive for depression. Negative for suicidal ideas. The patient is nervous/anxious.   All other systems reviewed and are negative.  denies chest pain, no shortness of breath, no vomiting, no rash , at this time denies dysuria, denies frequency, no fever or chills  Blood pressure 120/100, pulse 116, temperature 97.5 F (36.4 C), temperature source Oral, resp. rate 18, height 5' 4.02" (1.626 m), weight 90.447 kg (199 lb 6.4 oz), last menstrual period 08/05/2015, SpO2 98 %.Body mass index is 34.21 kg/(m^2).  General Appearance: Guarded  Eye Contact:  Fair  Speech:  Slow  Volume:  Normal  Mood:  Anxious and Depressed   Affect:  Tearful  Thought Process:  Linear and Descriptions of Associations: Intact  Orientation:  Full (Time, Place, and Person)  Thought Content: AH - reports she is [facility] - does not elaborate on them.  Suicidal Thoughts:  No denies any suicidal ideations, denies any self injurious ideations ; able to contract for safety- however is depressed   Homicidal Thoughts:  No  Memory:  recent and remote grossly intact immediate - fair  Judgement:  Fair   Insight: poor  Psychomotor Activity:  Restlessness  Concentration:  Concentration: Fair and Attention Span: Fair  Recall:  Good  Fund of Knowledge:  Good  Language:  Good  Akathisia:  Negative  Handed:  Right  AIMS (if indicated):   no akathisia, no abnormal involuntary movements   Assets:  Desire for Improvement Resilience  ADL's:  Intact  Cognition:  WNL  Sleep:  Number of Hours: 3      Treatment Plan Summary: [name] is depressed , withdrawn to her room mostly , is tearful at times , has sleep issues . She is also not eating well , and is not compliant on medications. Discussed with Dr.Saranga - who has seen patient for a second opinion for forced medications. Pt is currently IVCed.    Daily contact with patient to assess and evaluate symptoms and progress in treatment, Medication management, Plan inpatient admission  and medications as below  Will start Zyprexa 2.5 mg PO/IM daily and  5  Mg PO /IM  qhs if she refuses PO for psychosis. Pt is on Forced medication order. Will add Ativan 0.5 mg PO/IM bid for anxiety sx. Discontinued Trileptal and Lithium since pt was refusing it for concerns of side effects. Increased Doxepin to 20 mg po qhs for sleep. Will make available prn medications as per agitation protocol. Will continue to monitor vitals ,medication compliance and treatment side effects while patient is here.  Will monitor for medical issues as well as call consult as needed.  CSW will continue working  on disposition.  Recreational therapy consult.see notes . Patient to participate in therapeutic milieu. Charlise Giovanetti, MD 08/18/2015, 3:07 PM

## 2015-08-18 NOTE — [facility] Group Notes (Signed)
[facility] Group Notes:  (Nursing/MHT/Case Management/Adjunct)  Date:  08/18/2015  Time:  1:28 PM  Type of Therapy:  Nurse Education  Participation Level:  Did Not Attend    Jessica [name] 08/18/2015, 1:28 PM

## 2015-08-19 LAB — URINALYSIS W MICROSCOPIC (NOT AT ARMC)
Bilirubin Urine: NEGATIVE
GLUCOSE, UA: NEGATIVE mg/dL
Hgb urine dipstick: NEGATIVE
Ketones, ur: NEGATIVE mg/dL
Leukocytes, UA: NEGATIVE
NITRITE: NEGATIVE
PROTEIN: NEGATIVE mg/dL
RBC / HPF: NONE SEEN RBC/hpf (ref 0–5)
Specific Gravity, Urine: 1.031 — ABNORMAL HIGH (ref 1.005–1.030)
WBC, UA: NONE SEEN WBC/hpf (ref 0–5)
pH: 6 (ref 5.0–8.0)

## 2015-08-19 MED ORDER — OLANZAPINE 10 MG PO TBDP
10.0000 mg | ORAL_TABLET | Freq: Every day | ORAL | Status: DC
Start: 2015-08-19 — End: 2015-08-25
  Administered 2015-08-19 – 2015-08-24 (×6): 10 mg via ORAL
  Filled 2015-08-19 (×7): qty 1

## 2015-08-19 MED ORDER — OLANZAPINE 10 MG IM SOLR
10.0000 mg | Freq: Every day | INTRAMUSCULAR | Status: DC
  Filled 2015-08-19 (×7): qty 10

## 2015-08-19 NOTE — Progress Notes (Signed)
   D: Pt was laying in bed prior to the assessment. Writer attempted to have a conversation with the pt, by asking the pt to sit up in bed. Pt sat up in bed and took cup of meds.  Pt informed the writer that she wouldn't be taking her keflex, stating "I'm fine in that department". Writer explained to pt that she has a UTI, and the keflex is used to clear it up. Reluctantly pt accepted the keflex, but decided not to take the zyprexa. Writer explained to pt that she is on a forced med order and if she didn't accept the po, she would have to get an inj. Pt has no questions or concerns.   A:  Support and encouragement was offered. 15 min checks continued for safety.  R: Pt remains safe.

## 2015-08-19 NOTE — Progress Notes (Signed)
[facility]  08/19/2015 10:00 AM Drucilla ChaletJennifer L Daisey  MRN:  [id number]  Subjective:  Patient reports, "I am a little better."        Objective: Drucilla ChaletJennifer L Jans is awake, and oriented X4. Pt seen in bed again , has better eye contact than yesterday and answers most questions appropriately.Pt reports anxiety as high today and depression as better than yesterday. Pt did not have breakfast yet and writer encouraged pt to take it . Pt continues to need encouragement to take care of ADLS , as well as take her meals as well as medications. Pt is currently on forced medication order , will continue to the same. Pt denies ADRs of medications.  Per staff - pt making progress and is more reactive and interactive than yesterday.  Principal Problem: Schizoaffective disorder, bipolar type (HCC) Diagnosis:   Patient Active Problem List   Diagnosis Date Noted  . UTI (lower urinary tract infection) [N39.0]   . Schizoaffective disorder, bipolar type (HCC) [F25.0] 07/25/2015   Total Time spent with patient: 25 minutes  Past Medical History: Please see H&P.  Past Medical History  Diagnosis Date  . Bipolar 1 disorder (HCC)    History reviewed. No pertinent past surgical history. Family History:  Family History  Problem Relation Age of Onset  . Hypertension Mother   . Mental illness Father    Social History: Please see H&P.  History  Alcohol Use No     History  Drug Use No    Social History   Social History  . Marital Status: Single    Spouse Name: N/A  . Number of Children: N/A  . Years of Education: N/A   Social History Main Topics  . Smoking status: Never Smoker   . Smokeless tobacco: None  . Alcohol Use: No  . Drug Use: No  . Sexual Activity: No   Other Topics Concern  . None   Social History Narrative   Additional Social History:   Sleep: Fair  Appetite:  observed as poor- needs encouragement    Current Medications: Current Facility-Administered  Medications  Medication Dose Route Frequency Provider Last Rate Last Dose  . acetaminophen (TYLENOL) tablet 650 mg  650 mg Oral Q6H PRN Sanjuana KavaAgnes I Nwoko, NP   650 mg at 08/09/15 0108  . alum & mag hydroxide-simeth (MAALOX/MYLANTA) 200-200-20 MG/5ML suspension 30 mL  30 mL Oral Q4H PRN Sanjuana KavaAgnes I Nwoko, NP      . cephALEXin (KEFLEX) capsule 500 mg  500 mg Oral Q8H Neema Barreira, MD   500 mg at 08/19/15 0617  . doxepin (SINEQUAN) capsule 20 mg  20 mg Oral QHS [name] LongsSaramma Shauntel Prest, MD   20 mg at 08/18/15 2303  . feeding supplement (ENSURE ENLIVE) (ENSURE ENLIVE) liquid 237 mL  237 mL Oral Daily PRN [name] LongsSaramma Mandy Fitzwater, MD   237 mL at 08/19/15 0617  . hydrOXYzine (ATARAX/VISTARIL) tablet 25 mg  25 mg Oral Q6H PRN Kaimana Lurz, MD      . LORazepam (ATIVAN) tablet 0.5 mg  0.5 mg Oral BH-q12n4p Rayleen Wyrick, MD   0.5 mg at 08/18/15 1647   Or  . LORazepam (ATIVAN) injection 0.5 mg  0.5 mg Intramuscular BH-q12n4p Pattrick Bady, MD      . magnesium hydroxide (MILK OF MAGNESIA) suspension 30 mL  30 mL Oral Daily PRN Sanjuana KavaAgnes I Nwoko, NP   30 mL at 08/12/15 1716  . OLANZapine zydis (ZYPREXA) disintegrating tablet 10 mg  10 mg Oral QHS Tiffinie Caillier,  MD       Or  . OLANZapine (ZYPREXA) injection 10 mg  10 mg Intramuscular QHS Juliannah Ohmann, MD      . OLANZapine (ZYPREXA) tablet 2.5 mg  2.5 mg Oral QPC breakfast [name] Longs, MD   2.5 mg at 08/19/15 0820   Or  . OLANZapine (ZYPREXA) injection 2.5 mg  2.5 mg Intramuscular QPC breakfast Milena Liggett, MD      . ziprasidone (GEODON) capsule 20 mg  20 mg Oral BID PRN [name] Longs, MD       Or  . ziprasidone (GEODON) injection 10 mg  10 mg Intramuscular BID PRN [name] Longs, MD       Lab Results:  No results found for this or any previous visit (from the past 48 hour(s)).  Blood Alcohol level:  Lab Results  Component Value Date   ETH <5 08/03/2015   ETH <5 07/25/2015   Physical Findings: AIMS: Facial and Oral Movements Muscles of Facial Expression: None,  normal Lips and Perioral Area: None, normal Jaw: None, normal Tongue: None, normal,Extremity Movements Upper (arms, wrists, hands, fingers): None, normal Lower (legs, knees, ankles, toes): None, normal, Trunk Movements Neck, shoulders, hips: None, normal, Overall Severity Severity of abnormal movements (highest score from questions above): None, normal Incapacitation due to abnormal movements: None, normal Patient's awareness of abnormal movements (rate only patient's report): No Awareness, Dental Status Current problems with teeth and/or dentures?: No Does patient usually wear dentures?: No  CIWA:  CIWA-Ar Total: 0 COWS:  COWS Total Score: 0  Musculoskeletal: Strength & Muscle Tone: within normal limits Gait & Station: normal Patient leans: N/A  Psychiatric Specialty Exam: Physical Exam  Nursing note and vitals reviewed. Constitutional: She is oriented to person, place, and time. She appears well-developed.  Neck: Normal range of motion.  Musculoskeletal: Normal range of motion.  Neurological: She is alert and oriented to person, place, and time.  Skin: Skin is dry.  Psychiatric: She has a normal mood and affect. Her behavior is normal.    Review of Systems  Psychiatric/Behavioral: Positive for depression. Negative for suicidal ideas. The patient is nervous/anxious.   All other systems reviewed and are negative.  denies chest pain, no shortness of breath, no vomiting, no rash , at this time denies dysuria, denies frequency, no fever or chills  Blood pressure 131/90, pulse 127, temperature 98.7 F (37.1 C), temperature source Oral, resp. rate 18, height 5' 4.02" (1.626 m), weight 90.447 kg (199 lb 6.4 oz), last menstrual period 08/05/2015, SpO2 98 %.Body mass index is 34.21 kg/(m^2).  General Appearance: Guarded  Eye Contact:  Fair  Speech:  Slow  Volume:  Normal  Mood:  Anxious and Depressed  Affect:  Tearful on and off  Thought Process:  Linear and Descriptions of  Associations: Intact  Orientation:  Full (Time, Place, and Person)  Thought Content: AH - reports as minimal today  Suicidal Thoughts:  No denies any suicidal ideations, denies any self injurious ideations ; able to contract for safety- however is depressed   Homicidal Thoughts:  No  Memory:  recent and remote grossly intact immediate - fair  Judgement:  Fair   Insight: poor  Psychomotor Activity:  Restlessness  Concentration:  Concentration: Fair and Attention Span: Fair  Recall:  Good  Fund of Knowledge:  Good  Language:  Good  Akathisia:  Negative  Handed:  Right  AIMS (if indicated):   no akathisia, no abnormal involuntary movements   Assets:  Desire for Improvement  Resilience  ADL's:  Intact  Cognition:  WNL  Sleep:  Number of Hours: 4.75      Treatment Plan Summary: [name] is depressed , withdrawn to her room mostly , is tearful at times , and has trouble taking care of ADLs as well as take her meals or medications. Discussed with Dr.Saranga - who has seen patient for a second opinion for forced medications. Pt is currently IVCed.    Daily contact with patient to assess and evaluate symptoms and progress in treatment, Medication management, Plan inpatient admission  and medications as below  Will increase Zyprexa to 2.5 mg PO/IM daily and  10  Mg PO /IM  qhs if she refuses PO for psychosis. Pt is on Forced medication order. Will continue  Ativan 0.5 mg PO/IM bid for anxiety sx. Discontinued Trileptal and Lithium since pt was refusing it for concerns of side effects. Increased Doxepin to 20 mg po qhs for sleep. Will make available prn medications as per agitation protocol. Will continue to monitor vitals ,medication compliance and treatment side effects while patient is here.  Will monitor for medical issues as well as call consult as needed.  CSW will continue working on disposition.  Recreational therapy consult.see notes . Patient to participate in therapeutic  milieu. Cylie Dor, MD 08/19/2015, 10:00 AM

## 2015-08-19 NOTE — Progress Notes (Signed)
Recreation Therapy Notes  06.23.2017 approximately 2:45pm. LRT returned to continue work on breathing techniques with patient. Patient sleeping upon LRT arrival to unit. LRT will continue to work with patient during admission. Marykay Lexenise L Jeannie Mallinger, LRT/CTRS             Jearl KlinefelterBlanchfield, Esperanza Madrazo L 08/19/2015 3:16 PM

## 2015-08-19 NOTE — [facility] Group Notes (Signed)
[facility] LCSW Group Therapy  08/19/2015  1:05 PM  Type of Therapy:  Group therapy  Participation Level:  Active  Participation Quality:  Attentive  Affect:  Flat  Cognitive:  Oriented  Insight:  Limited  Engagement in Therapy:  Limited  Modes of Intervention:  Discussion, Socialization  Summary of Progress/Problems:  Chaplain was here to lead a group on themes of hope and courage. Invited.  Chose to not attend. Jessica [name], Jessica [name] 08/19/2015 10:32 AM

## 2015-08-19 NOTE — Progress Notes (Signed)
[facility] Group Notes:  (Nursing/MHT/Case Management/Adjunct)  Date:  08/19/2015  Time:  8:35 PM  Type of Therapy:  Psychoeducational Skills  Participation Level:  Active  Participation Quality:  Attentive  Affect:  Appropriate  Cognitive:  Appropriate  Insight:  Limited  Engagement in Group:  Developing/Improving  Modes of Intervention:  Education  Summary of Progress/Problems: The patient expressed in group that she had a better day since her appetite increased. As a theme for the day, her coping skill will be to use "deep breathing" and "exercise".   Hazle CocaGOODMAN, Tallie Hevia S 08/19/2015, 8:35 PM

## 2015-08-19 NOTE — Progress Notes (Signed)
DAR NOTE: Patient presents with flat affect and depressed mood.  Denies pain, auditory and visual hallucinations.  Rates depression at 3, hopelessness at 0, and anxiety  4.  Maintained on routine safety checks.  Medications given as prescribed.  Support and encouragement offered as needed.  Attended group and participated.  States goal for today is "to improve myself and my mind."  Patient remained in her room in bed with eyes closed.  Patient was visible in milieu briefly.  Made good eye contact during assessment.

## 2015-08-20 LAB — URINE CULTURE: Special Requests: NORMAL

## 2015-08-20 NOTE — Progress Notes (Signed)
[facility]  08/20/2015 1:25 PM Jessica [name]  MRN:  [id number]  Subjective:  Patient stated that she is feeling better since she has been taking her medication and continue to have distance of sleep, appetite and racing thoughts in her head.  Objective: Jessica [name] is calm, cooperative and pleasant during my evaluation. Patient reportedly stopped her previous medication Abilify because of weight gain. Patient reportedly stopped her medication Lamictal secondary to blurred vision. Patient has been compliant with her medication management and also working with the counselors regarding stress management and short-term course of the day. Patient continued to endorse increased symptoms of depression, anxiety and continue to be nervous. Patient has been slowly responding to her medication management without adverse effects. Patient has been on forced medication order with the second M.D. recommendation as patient condition is deteriorated without any medication management.   Principal Problem: Schizoaffective disorder, bipolar type (HCC) Diagnosis:   Patient Active Problem List   Diagnosis Date Noted  . UTI (lower urinary tract infection) [N39.0]   . Schizoaffective disorder, bipolar type (HCC) [F25.0] 07/25/2015   Total Time spent with patient: 25 minutes  Past Medical History: Please see H&P.  Past Medical History  Diagnosis Date  . Bipolar 1 disorder (HCC)    History reviewed. No pertinent past surgical history. Family History:  Family History  Problem Relation Age of Onset  . Hypertension Mother   . Mental illness Father    Social History: Please see H&P.  History  Alcohol Use No     History  Drug Use No    Social History   Social History  . Marital Status: Single    Spouse Name: N/A  . Number of Children: N/A  . Years of Education: N/A   Social History Main Topics  . Smoking status: Never Smoker   . Smokeless tobacco: None  . Alcohol Use: No  .  Drug Use: No  . Sexual Activity: No   Other Topics Concern  . None   Social History Narrative   Additional Social History:   Sleep: Fair  Appetite:  Patient has a poor appetite - needs encouragement    Current Medications: Current Facility-Administered Medications  Medication Dose Route Frequency Provider Last Rate Last Dose  . acetaminophen (TYLENOL) tablet 650 mg  650 mg Oral Q6H PRN Sanjuana KavaAgnes I Nwoko, NP   650 mg at 08/09/15 0108  . alum & mag hydroxide-simeth (MAALOX/MYLANTA) 200-200-20 MG/5ML suspension 30 mL  30 mL Oral Q4H PRN Sanjuana KavaAgnes I Nwoko, NP      . cephALEXin (KEFLEX) capsule 500 mg  500 mg Oral Q8H Saramma Eappen, MD   500 mg at 08/20/15 0626  . doxepin (SINEQUAN) capsule 20 mg  20 mg Oral QHS [name] LongsSaramma Eappen, MD   20 mg at 08/19/15 2115  . feeding supplement (ENSURE ENLIVE) (ENSURE ENLIVE) liquid 237 mL  237 mL Oral Daily PRN [name] LongsSaramma Eappen, MD   237 mL at 08/20/15 0740  . hydrOXYzine (ATARAX/VISTARIL) tablet 25 mg  25 mg Oral Q6H PRN Saramma Eappen, MD      . LORazepam (ATIVAN) tablet 0.5 mg  0.5 mg Oral BH-q12n4p Saramma Eappen, MD   0.5 mg at 08/20/15 1115   Or  . LORazepam (ATIVAN) injection 0.5 mg  0.5 mg Intramuscular BH-q12n4p Saramma Eappen, MD      . magnesium hydroxide (MILK OF MAGNESIA) suspension 30 mL  30 mL Oral Daily PRN Sanjuana KavaAgnes I Nwoko, NP   30 mL at 08/12/15  1716  . OLANZapine zydis (ZYPREXA) disintegrating tablet 10 mg  10 mg Oral QHS [name] Longs, MD   10 mg at 08/19/15 2115   Or  . OLANZapine (ZYPREXA) injection 10 mg  10 mg Intramuscular QHS Saramma Eappen, MD      . OLANZapine (ZYPREXA) tablet 2.5 mg  2.5 mg Oral QPC breakfast [name] Longs, MD   2.5 mg at 08/20/15 0739   Or  . OLANZapine (ZYPREXA) injection 2.5 mg  2.5 mg Intramuscular QPC breakfast Saramma Eappen, MD      . ziprasidone (GEODON) capsule 20 mg  20 mg Oral BID PRN [name] Longs, MD       Or  . ziprasidone (GEODON) injection 10 mg  10 mg Intramuscular BID PRN [name] Longs, MD        Lab Results:  Results for orders placed or performed during the hospital encounter of 08/05/15 (from the past 48 hour(s))  Urinalysis with microscopic (not at [facility])     Status: Abnormal   Collection Time: 08/19/15  2:56 PM  Result Value Ref Range   Color, Urine YELLOW YELLOW   APPearance TURBID (A) CLEAR   Specific Gravity, Urine 1.031 (H) 1.005 - 1.030   pH 6.0 5.0 - 8.0   Glucose, UA NEGATIVE NEGATIVE mg/dL   Hgb urine dipstick NEGATIVE NEGATIVE   Bilirubin Urine NEGATIVE NEGATIVE   Ketones, ur NEGATIVE NEGATIVE mg/dL   Protein, ur NEGATIVE NEGATIVE mg/dL   Nitrite NEGATIVE NEGATIVE   Leukocytes, UA NEGATIVE NEGATIVE   WBC, UA NONE SEEN 0 - 5 WBC/hpf   RBC / HPF NONE SEEN 0 - 5 RBC/hpf   Bacteria, UA RARE (A) NONE SEEN   Squamous Epithelial / LPF 0-5 (A) NONE SEEN   Crystals URIC ACID CRYSTALS (A) NEGATIVE   Urine-Other AMORPHOUS URATES/PHOSPHATES     Comment: Performed at [facility]    Blood Alcohol level:  Lab Results  Component Value Date   [facility] <5 08/03/2015   ETH <5 07/25/2015   Physical Findings: AIMS: Facial and Oral Movements Muscles of Facial Expression: None, normal Lips and Perioral Area: None, normal Jaw: None, normal Tongue: None, normal,Extremity Movements Upper (arms, wrists, hands, fingers): None, normal Lower (legs, knees, ankles, toes): None, normal, Trunk Movements Neck, shoulders, hips: None, normal, Overall Severity Severity of abnormal movements (highest score from questions above): None, normal Incapacitation due to abnormal movements: None, normal Patient's awareness of abnormal movements (rate only patient's report): No Awareness, Dental Status Current problems with teeth and/or dentures?: No Does patient usually wear dentures?: No  CIWA:  CIWA-Ar Total: 0 COWS:  COWS Total Score: 0  Musculoskeletal: Strength & Muscle Tone: within normal limits Gait & Station: normal Patient leans: N/A  Psychiatric Specialty  Exam: Physical Exam  Nursing note and vitals reviewed. Constitutional: She is oriented to person, place, and time. She appears well-developed.  Neck: Normal range of motion.  Musculoskeletal: Normal range of motion.  Neurological: She is alert and oriented to person, place, and time.  Skin: Skin is dry.  Psychiatric: She has a normal mood and affect. Her behavior is normal.    Review of Systems  Psychiatric/Behavioral: Positive for depression. Negative for suicidal ideas. The patient is nervous/anxious.   All other systems reviewed and are negative.    Blood pressure 132/86, pulse 104, temperature 98 F (36.7 C), temperature source Oral, resp. rate 16, height 5' 4.02" (1.626 m), weight 90.447 kg (199 lb 6.4 oz), last menstrual period 08/05/2015, SpO2 98 %.  Body mass index is 34.21 kg/(m^2).  General Appearance: Guarded  Eye Contact:  Fair  Speech:  Slow  Volume:  Normal  Mood:  Anxious and Depressed  Affect:  Tearful dysphoric   Thought Process:  Linear and Descriptions of Associations: Intact  Orientation:  Full (Time, Place, and Person)  Thought Content: racing thoughts and confusion   Suicidal Thoughts:  No denies any suicidal ideations, denies any self injurious ideations ; able to contract for safety- however is depressed   Homicidal Thoughts:  No  Memory:  recent and remote grossly intact immediate - fair  Judgement:  Fair   Insight: poor  Psychomotor Activity:  Restlessness  Concentration:  Concentration: Fair and Attention Span: Fair  Recall:  Good  Fund of Knowledge:  Good  Language:  Good  Akathisia:  Negative  Handed:  Right  AIMS (if indicated):   no akathisia, no abnormal involuntary movements   Assets:  Desire for Improvement Resilience  ADL's:  Intact  Cognition:  WNL  Sleep:  Number of Hours: 6.25      Treatment Plan Summary: Jessica [name] Has been diagnosed with this schizoaffective disorder and most recent episode is increased symptoms of depressed tearful,  withdrawn to her room mostly and has trouble taking care of ADLs as well as take her meals or medications. Dr.Saranga -  has seen patient for a second opinion for forced medications. Patient is involuntarily committed.    Daily contact with patient to assess and evaluate symptoms and progress in treatment, Medication management, Plan inpatient admission  and medications as below     Continue Zyprexa to 2.5 mg PO/IM daily and 10  Mg PO /IM  qhs if she refuses PO for psychosis.  Patient is on Forced medication order. Will continue  Ativan 0.5 mg PO/IM bid for anxiety sx. Discontinued Trileptal and Lithium since pt was refusing it for concerns of side effects. Continue Doxepin to 20 mg po qhs for insomnia. Will make available prn medications as per agitation protocol. Will continue to monitor vitals ,medication compliance and treatment side effects while patient is here.  Will monitor for medical issues as well as call consult as needed.  CSW will continue working on disposition.  Recreational therapy consult.see notes . Patient to participate in therapeutic milieu.   Leata MouseJANARDHANA Chrisma Hurlock, MD 08/20/2015, 1:25 PM

## 2015-08-20 NOTE — Progress Notes (Signed)
D: Patient spent shift in room, appropriate, calm, and cooperative.  Denies SI, HI, and AVH.  Says she achieved her goal which was "to go to lunch and dinner."  Also states "I was able to eat more today."  Fair eye contact today, smiled, and said "I appreciate your support" after interaction. A: Spoke with patient 1:1, patient remains on 15 minute checks for safety. R: Remains safe on unit, appeared to be asleep on 2300 check.

## 2015-08-20 NOTE — [facility] Group Notes (Signed)
[facility] Group Notes:  (Nursing/MHT/Case Management/Adjunct)  Date:  08/20/2015  Time:  10:38 AM  Type of Therapy:  Nurse Education  Participation Level:  Active  Participation Quality:  Appropriate  Affect:  Appropriate  Cognitive:  Appropriate  Insight:  Appropriate  Engagement in Group:  Engaged  Modes of Intervention:  Discussion, Education and Support  Summary of Progress/Problems:  Jessica [name] shared that she wanted to learn stress management skills today.  Maurine SimmeringShugart, Terrion Gencarelli M 08/20/2015, 10:38 AM

## 2015-08-20 NOTE — [facility] Group Notes (Signed)
[facility] Group Notes:  (Clinical Social Work)  08/20/2015  11:15-12:00PM  Summary of Progress/Problems:   Today's process group involved patients discussing their feelings related to being hospitalized, as well as how they can use their present feelings to create a word which can be their focus for the day. The patient expressed her primary feeling about being hospitalized is that her independence has been taken away.  Her word for the day was "Journey" to describe what has been going on in her life, that this is a different part that is very different from before.  Type of Therapy:  Group Therapy - Process  Participation Level:  Minimal  Participation Quality:  Appropriate  Affect:  Flat  Cognitive:  Confused  Insight:  Lacking  Engagement in Therapy:  Limited  Modes of Intervention:  Exploration, Discussion  Ambrose MantleMareida Grossman-Orr, LCSW 08/20/2015, 4:33 PM

## 2015-08-20 NOTE — Progress Notes (Signed)
D: Pt is flat, isolative and withdrawn to his room; was in bed for most of the evening. Pt endorsed mild depression and mild anxiety; states, "I am way better than I was yesterday." Pt remained respectful, calm and cooperative through the shift assessment. A: Medications offered as prescribed.  Support, encouragement, and safe environment provided.  15-minute safety checks continue. R: Pt was med compliant.  Pt attended wrap-up group. Safety checks continue.

## 2015-08-20 NOTE — Progress Notes (Signed)
D: [name] was pleasant upon approach in room. She was not aware that peers had already been to breakfast. She complained of nausea and voiced appreciation for ginger ale. She took med without issue and thanked this Clinical research associatewriter. She said she was feeling better and had no complaints. She said, "It's slower going than I thought, but I'm getting better," and noted that she had attended group last night. She denied SI/HI/AVH. She was alert and appropriate during group. A: Meds given as ordered. Q15 safety checks maintained. Support/encouragement offered. R: Pt remains free from harm and continues with treatment. Will continue to monitor for needs/safety.

## 2015-08-21 NOTE — Progress Notes (Signed)
DAR NOTE: Patient mood and affect is calm and bright.  Denies pain, auditory and visual hallucinations.  Rates depression at 3, hopelessness at 0, and anxiety at 3.  Described energy level as low and concentration as poor.  Maintained on routine safety checks.  Medications given as prescribed.  Support and encouragement offered as needed.  Attended group and participated.  States goal for today is "to improve my endurance."  Patient was visible in the milieu today.

## 2015-08-21 NOTE — Plan of Care (Signed)
Problem: Coping: Goal: Ability to identify and develop effective coping behavior will improve Outcome: Progressing Patient   Problem: Activity: Goal: Interest or engagement in activities will improve Outcome: Progressing States she met her goal of going to lunch and dinner with milieu. Goal: Sleeping patterns will improve Outcome: Completed/Met Date Met:  08/21/15 Patient reports sleeping well while here.  Problem: Education: Goal: Emotional status will improve Outcome: Progressing Patient reports improvement in mood since admission

## 2015-08-21 NOTE — Progress Notes (Signed)
[facility]  08/21/2015 1:53 PM Jessica ChaletJennifer L Finnie  MRN:  [id number]  Subjective:  "I'm doing little bit better today than yesterday and also ate my breakfast fast Patient continue reporting depression, disturbance of sleep, appetite and racing thoughts in her head.  Objective: Jessica ChaletJennifer L Rog seen, chart reviewed and case discussed with the staff RN. Patient has been compliant with her medication management and also tolerating her medication without significant side effects. Patient related her depression as 6-7 out of 10 anxiety is 5 out of 10. Patient showed positive response to her current medication management.  Patient has been compliant with her medication management and also working with the counselors regarding stress management and short-term course of the day. Patient continued to endorse increased symptoms of depression, anxiety and nervous. Patient has been on forced medication order with the second M.D. recommendation as patient condition is deteriorated without any medication management.   Principal Problem: Schizoaffective disorder, bipolar type (HCC) Diagnosis:   Patient Active Problem List   Diagnosis Date Noted  . UTI (lower urinary tract infection) [N39.0]   . Schizoaffective disorder, bipolar type (HCC) [F25.0] 07/25/2015   Total Time spent with patient: 25 minutes  Past Medical History: Please see H&P.  Past Medical History  Diagnosis Date  . Bipolar 1 disorder (HCC)    History reviewed. No pertinent past surgical history. Family History:  Family History  Problem Relation Age of Onset  . Hypertension Mother   . Mental illness Father    Social History: Please see H&P.  History  Alcohol Use No     History  Drug Use No    Social History   Social History  . Marital Status: Single    Spouse Name: N/A  . Number of Children: N/A  . Years of Education: N/A   Social History Main Topics  . Smoking status: Never Smoker   . Smokeless tobacco: None   . Alcohol Use: No  . Drug Use: No  . Sexual Activity: No   Other Topics Concern  . None   Social History Narrative   Additional Social History:   Sleep: Fair  Appetite:  Patient has a poor appetite - needs encouragement    Current Medications: Current Facility-Administered Medications  Medication Dose Route Frequency Provider Last Rate Last Dose  . acetaminophen (TYLENOL) tablet 650 mg  650 mg Oral Q6H PRN Sanjuana KavaAgnes I Nwoko, NP   650 mg at 08/09/15 0108  . alum & mag hydroxide-simeth (MAALOX/MYLANTA) 200-200-20 MG/5ML suspension 30 mL  30 mL Oral Q4H PRN Sanjuana KavaAgnes I Nwoko, NP      . cephALEXin (KEFLEX) capsule 500 mg  500 mg Oral Q8H Saramma Eappen, MD   500 mg at 08/21/15 0606  . doxepin (SINEQUAN) capsule 20 mg  20 mg Oral QHS [name] LongsSaramma Eappen, MD   20 mg at 08/20/15 2124  . feeding supplement (ENSURE ENLIVE) (ENSURE ENLIVE) liquid 237 mL  237 mL Oral Daily PRN [name] LongsSaramma Eappen, MD   237 mL at 08/20/15 0740  . hydrOXYzine (ATARAX/VISTARIL) tablet 25 mg  25 mg Oral Q6H PRN Saramma Eappen, MD      . LORazepam (ATIVAN) tablet 0.5 mg  0.5 mg Oral BH-q12n4p Saramma Eappen, MD   0.5 mg at 08/21/15 1252   Or  . LORazepam (ATIVAN) injection 0.5 mg  0.5 mg Intramuscular BH-q12n4p Saramma Eappen, MD      . magnesium hydroxide (MILK OF MAGNESIA) suspension 30 mL  30 mL Oral Daily PRN Nelda MarseilleAgnes I  Nwoko, NP   30 mL at 08/12/15 1716  . OLANZapine zydis (ZYPREXA) disintegrating tablet 10 mg  10 mg Oral QHS [name] Longs, MD   10 mg at 08/20/15 2124   Or  . OLANZapine (ZYPREXA) injection 10 mg  10 mg Intramuscular QHS Saramma Eappen, MD      . OLANZapine (ZYPREXA) tablet 2.5 mg  2.5 mg Oral QPC breakfast [name] Longs, MD   2.5 mg at 08/21/15 0825   Or  . OLANZapine (ZYPREXA) injection 2.5 mg  2.5 mg Intramuscular QPC breakfast Saramma Eappen, MD      . ziprasidone (GEODON) capsule 20 mg  20 mg Oral BID PRN [name] Longs, MD       Or  . ziprasidone (GEODON) injection 10 mg  10 mg Intramuscular BID PRN  [name] Longs, MD       Lab Results:  Results for orders placed or performed during the hospital encounter of 08/05/15 (from the past 48 hour(s))  Urinalysis with microscopic (not at [facility])     Status: Abnormal   Collection Time: 08/19/15  2:56 PM  Result Value Ref Range   Color, Urine YELLOW YELLOW   APPearance TURBID (A) CLEAR   Specific Gravity, Urine 1.031 (H) 1.005 - 1.030   pH 6.0 5.0 - 8.0   Glucose, UA NEGATIVE NEGATIVE mg/dL   Hgb urine dipstick NEGATIVE NEGATIVE   Bilirubin Urine NEGATIVE NEGATIVE   Ketones, ur NEGATIVE NEGATIVE mg/dL   Protein, ur NEGATIVE NEGATIVE mg/dL   Nitrite NEGATIVE NEGATIVE   Leukocytes, UA NEGATIVE NEGATIVE   WBC, UA NONE SEEN 0 - 5 WBC/hpf   RBC / HPF NONE SEEN 0 - 5 RBC/hpf   Bacteria, UA RARE (A) NONE SEEN   Squamous Epithelial / LPF 0-5 (A) NONE SEEN   Crystals URIC ACID CRYSTALS (A) NEGATIVE   Urine-Other AMORPHOUS URATES/PHOSPHATES     Comment: Performed at [facility]  Urine culture     Status: Abnormal   Collection Time: 08/19/15  2:56 PM  Result Value Ref Range   Specimen Description      URINE, CLEAN CATCH Performed at [facility]    Special Requests      Normal Performed at [facility]    Culture MULTIPLE SPECIES PRESENT, SUGGEST RECOLLECTION (A)    Report Status 08/20/2015 FINAL     Blood Alcohol level:  Lab Results  Component Value Date   ETH <5 08/03/2015   ETH <5 07/25/2015   Physical Findings: AIMS: Facial and Oral Movements Muscles of Facial Expression: None, normal Lips and Perioral Area: None, normal Jaw: None, normal Tongue: None, normal,Extremity Movements Upper (arms, wrists, hands, fingers): None, normal Lower (legs, knees, ankles, toes): None, normal, Trunk Movements Neck, shoulders, hips: None, normal, Overall Severity Severity of abnormal movements (highest score from questions above): None, normal Incapacitation due to abnormal movements:  None, normal Patient's awareness of abnormal movements (rate only patient's report): No Awareness, Dental Status Current problems with teeth and/or dentures?: No Does patient usually wear dentures?: No  CIWA:  CIWA-Ar Total: 0 COWS:  COWS Total Score: 0  Musculoskeletal: Strength & Muscle Tone: within normal limits Gait & Station: normal Patient leans: N/A  Psychiatric Specialty Exam: Physical Exam  Nursing note and vitals reviewed. Constitutional: She is oriented to person, place, and time. She appears well-developed.  Neck: Normal range of motion.  Musculoskeletal: Normal range of motion.  Neurological: She is alert and oriented to person, place, and time.  Skin: Skin is dry.  Psychiatric: She has a normal mood and affect. Her behavior is normal.    Review of Systems  Psychiatric/Behavioral: Positive for depression. Negative for suicidal ideas. The patient is nervous/anxious.   All other systems reviewed and are negative.    Blood pressure 136/88, pulse 119, temperature 98.2 F (36.8 C), temperature source Oral, resp. rate 16, height 5' 4.02" (1.626 m), weight 90.447 kg (199 lb 6.4 oz), last menstrual period 08/05/2015, SpO2 98 %.Body mass index is 34.21 kg/(m^2).  General Appearance: Guarded  Eye Contact:  Fair  Speech:  Slow  Volume:  Normal  Mood:  Anxious and Depressed  Affect:  Tearful dysphoric   Thought Process:  Linear and Descriptions of Associations: Intact  Orientation:  Full (Time, Place, and Person)  Thought Content: racing thoughts and confusion   Suicidal Thoughts:  No denies any suicidal ideations, denies any self injurious ideations ; able to contract for safety- however is depressed   Homicidal Thoughts:  No  Memory:  recent and remote grossly intact immediate - fair  Judgement:  Fair   Insight: poor  Psychomotor Activity:  Restlessness  Concentration:  Concentration: Fair and Attention Span: Fair  Recall:  Good  Fund of Knowledge:  Good  Language:   Good  Akathisia:  Negative  Handed:  Right  AIMS (if indicated):   no akathisia, no abnormal involuntary movements   Assets:  Desire for Improvement Resilience  ADL's:  Intact  Cognition:  WNL  Sleep:  Number of Hours: 6.5      Treatment Plan Summary: [name] Has been diagnosed with this schizoaffective disorder and most recent episode is increased symptoms of depressed tearful, withdrawn to her room mostly and has trouble taking care of ADLs as well as take her meals or medications. Dr.Saranga -  has seen patient for a second opinion for forced medications. Patient is involuntarily committed.    Daily contact with patient to assess and evaluate symptoms and progress in treatment, Medication management, Plan inpatient admission  and medications as below     Continue Zyprexa to 2.5 mg PO/IM daily and 10  Mg PO /IM  qhs if she refuses PO for psychosis.  Patient is on Forced medication order.  Will continue  Ativan 0.5 mg PO/IM bid for anxiety sx. Continue Doxepin to 20 mg po qhs for insomnia. Will make available prn medications as per agitation protocol. Will continue to monitor vitals ,medication compliance and treatment side effects while patient is here.  Will monitor for medical issues as well as call consult as needed.  CSW will continue working on disposition.  Recreational therapy consult.see notes . Patient to participate in therapeutic milieu.   Leata MouseJANARDHANA Nayelli Inglis, MD 08/21/2015, 1:53 PM

## 2015-08-21 NOTE — [facility] Group Notes (Signed)
[facility] Group Notes:  (Clinical Social Work)  08/21/2015  11:00AM-12:00PM  Summary of Progress/Problems:  The main focus of today's process group was to listen to a variety of genres of music and to identify that different types of music provoke different responses.  The patient then was able to identify personally what was soothing for them, as well as energizing, as well as how patient can personally use this knowledge in sleep habits, with depression, and with other symptoms.  The patient expressed at the beginning of group the overall feeling of "okay" but seemed quite depressed.   She was very quiet throughout group, did not react to any music, but smiled widely at the end.  Type of Therapy:  Music Therapy   Participation Level:  Active  Participation Quality:  Attentive  Affect:  Flat and Depressed  Cognitive:  Oriented  Insight:  Engaged  Engagement in Therapy:  Engaged  Modes of Intervention:   Activity, Exploration  Ambrose MantleMareida Grossman-Orr, LCSW 08/21/2015

## 2015-08-21 NOTE — Progress Notes (Signed)
Adult Psychoeducational Group Note  Date:  08/21/2015 Time:  8:15PM  Group Topic/Focus:  Wrap-Up Group:   The focus of this group is to help patients review their daily goal of treatment and discuss progress on daily workbooks.  Participation Level:  Did Not Attend   Pt did not attend wrap-up group. Pt was in bed.    Jessica [name] 08/21/2015, 8:32 PM

## 2015-08-22 NOTE — Progress Notes (Signed)
Patient ID: Jessica ChaletJennifer L Goldblatt, female   DOB: 05/10/1975, 40 y.o.   MRN: [id number]  DAR: Pt. Denies SI/HI and A/V Hallucinations. Her affect is blunted and eye contact is brief. She does smile briefly when Clinical research associatewriter addresses her. She is taking her medications PO with no apparent distress. She reports sleep is good, appetite is good, energy level is low, and concentration is poor. She rates her depression 3/10, hopelessness 0/10, and anxiety 3/10. Patient does not report any pain or discomfort at this time. Support and encouragement provided to the patient. Scheduled medications administered to patient per physician's orders. Patient is minimal but cooperative. She continues to isolate in her room other than meals and medications although encouraged to come in milieu. Q15 minute checks are maintained for safety.

## 2015-08-22 NOTE — Progress Notes (Signed)
Recreation Therapy Notes  06.26.2017 approximately 2:35pm. LRT returned to continue work on breathing techniques with patient. Patient reports that she has been using techniques and finds them to be very helpful. Patient declined participation with LRT. LRT inquired about patient hygiene habits, patient reports she got a "sponge bath" yesterday. LRT encouraged patient to shower independently today, patient agreed to do so and requested a towel to use following her shower, LRT provided. Patient was observed to gather supplies needed for shower together at this time.   Marykay Lexenise L Rayel Santizo, LRT/CTRS    Jearl KlinefelterBlanchfield, Perley Arthurs L   08/22/2015 3:41 PM

## 2015-08-22 NOTE — Plan of Care (Signed)
Problem: Self-Concept: Goal: Ability to disclose and discuss suicidal ideas will improve Outcome: Progressing Patient denies SI

## 2015-08-22 NOTE — [facility] Group Notes (Signed)
[facility] LCSW Group Therapy  08/22/2015 1:15 pm  Type of Therapy: Process Group Therapy  Participation Level:  Active  Participation Quality:  Appropriate  Affect:  Flat  Cognitive:  Oriented  Insight:  Improving  Engagement in Group:  Limited  Engagement in Therapy:  Limited  Modes of Intervention:  Activity, Clarification, Education, Problem-solving and Support  Summary of Progress/Problems: Today's group addressed the issue of overcoming obstacles.  Patients were asked to identify their biggest obstacle post d/c that stands in the way of their on-going success, and then problem solve as to how to manage this. Invited.  Chose to not attend.  [name], Essex Perry B 08/22/2015   2:50 PM

## 2015-08-22 NOTE — Progress Notes (Signed)
Patient did not attend group.

## 2015-08-22 NOTE — [facility] Group Notes (Signed)
[facility]   08/22/2015 2:42 PM  Participation Quality:  Engaged  Mood/Affect:  Flat  Depression Rating:    Anxiety Rating:    Thoughts of Suicide:  No Will you contract for safety?   NA  Current AVH:  denies  Plan for Discharge/Comments:  Stayed the entire time.  This is the first group with me that [name] has attended since admission.  Although she looked disheveled, she had good eye contact and was engaged in conversation with me.  Stated she is feeling somewhat better,  Shared that her mother visited over the weekend, "and it went better than I expected."  Transportation Means:   Supports:  [place], [place]ney B

## 2015-08-22 NOTE — Progress Notes (Signed)
Pt had been in room and in bed for much of the evening, did not attend or participate in evening group activity. Pt dd receive bedtime medications without incident and did speak about some on-going depression and having some difficulties with sleep but spoke about how she thinks the medications are helping some. A. Support provided. R. Safety maintained, will continue to monitor.

## 2015-08-22 NOTE — Progress Notes (Signed)
[facility]  08/22/2015 2:59 PM Jessica [name]  MRN:  [id number]  Subjective:  "I'm doing better , I just went and ate my breakfast.'   Objective: Jessica [name] seen, chart reviewed and case discussed with the staff RN.  Pt is currently on forced medication order , seems to be progressing. She continues to have mood lability as well as anxiety - hence will consider a mood stabilizer. Pt however continues to refuse the addition of other medications at this time. Pt is tolerating zyprexa well - will continue to encourage and support. Her ADLs has improved and she is seen as taking showers with encouragement from staff.    Principal Problem: Schizoaffective disorder, bipolar type (HCC) Diagnosis:   Patient Active Problem List   Diagnosis Date Noted  . UTI (lower urinary tract infection) [N39.0]   . Schizoaffective disorder, bipolar type (HCC) [F25.0] 07/25/2015   Total Time spent with patient: 25 minutes  Past Medical History: Please see H&P.  Past Medical History  Diagnosis Date  . Bipolar 1 disorder (HCC)    History reviewed. No pertinent past surgical history. Family History:  Family History  Problem Relation Age of Onset  . Hypertension Mother   . Mental illness Father    Social History: Please see H&P.  History  Alcohol Use No     History  Drug Use No    Social History   Social History  . Marital Status: Single    Spouse Name: N/A  . Number of Children: N/A  . Years of Education: N/A   Social History Main Topics  . Smoking status: Never Smoker   . Smokeless tobacco: None  . Alcohol Use: No  . Drug Use: No  . Sexual Activity: No   Other Topics Concern  . None   Social History Narrative   Additional Social History:   Sleep: Fair  Appetite:  Patient has a poor appetite - needs encouragement    Current Medications: Current Facility-Administered Medications  Medication Dose Route Frequency Provider Last Rate Last Dose  .  acetaminophen (TYLENOL) tablet 650 mg  650 mg Oral Q6H PRN Sanjuana KavaAgnes I Nwoko, NP   650 mg at 08/09/15 0108  . alum & mag hydroxide-simeth (MAALOX/MYLANTA) 200-200-20 MG/5ML suspension 30 mL  30 mL Oral Q4H PRN Sanjuana KavaAgnes I Nwoko, NP      . doxepin (SINEQUAN) capsule 20 mg  20 mg Oral QHS Jessica LongsSaramma Brayson Livesey, MD   20 mg at 08/21/15 2151  . feeding supplement (ENSURE ENLIVE) (ENSURE ENLIVE) liquid 237 mL  237 mL Oral Daily PRN Jessica LongsSaramma Ruth Kovich, MD   237 mL at 08/20/15 0740  . hydrOXYzine (ATARAX/VISTARIL) tablet 25 mg  25 mg Oral Q6H PRN Angle Karel, MD      . LORazepam (ATIVAN) tablet 0.5 mg  0.5 mg Oral BH-q12n4p Lilienne Weins, MD   0.5 mg at 08/22/15 1206   Or  . LORazepam (ATIVAN) injection 0.5 mg  0.5 mg Intramuscular BH-q12n4p Taejah Ohalloran, MD      . magnesium hydroxide (MILK OF MAGNESIA) suspension 30 mL  30 mL Oral Daily PRN Sanjuana KavaAgnes I Nwoko, NP   30 mL at 08/12/15 1716  . OLANZapine zydis (ZYPREXA) disintegrating tablet 10 mg  10 mg Oral QHS Jessica LongsSaramma Kyriaki Moder, MD   10 mg at 08/21/15 2151   Or  . OLANZapine (ZYPREXA) injection 10 mg  10 mg Intramuscular QHS Beatric Fulop, MD      . OLANZapine (ZYPREXA) tablet 2.5 mg  2.5  mg Oral QPC breakfast Jessica Longs, MD   2.5 mg at 08/22/15 0817   Or  . OLANZapine (ZYPREXA) injection 2.5 mg  2.5 mg Intramuscular QPC breakfast Brennan Karam, MD      . ziprasidone (GEODON) capsule 20 mg  20 mg Oral BID PRN Jessica Longs, MD       Or  . ziprasidone (GEODON) injection 10 mg  10 mg Intramuscular BID PRN Jessica Longs, MD       Lab Results:  No results found for this or any previous visit (from the past 48 hour(s)).  Blood Alcohol level:  Lab Results  Component Value Date   ETH <5 08/03/2015   ETH <5 07/25/2015   Physical Findings: AIMS: Facial and Oral Movements Muscles of Facial Expression: None, normal Lips and Perioral Area: None, normal Jaw: None, normal Tongue: None, normal,Extremity Movements Upper (arms, wrists, hands, fingers): None,  normal Lower (legs, knees, ankles, toes): None, normal, Trunk Movements Neck, shoulders, hips: None, normal, Overall Severity Severity of abnormal movements (highest score from questions above): None, normal Incapacitation due to abnormal movements: None, normal Patient's awareness of abnormal movements (rate only patient's report): No Awareness, Dental Status Current problems with teeth and/or dentures?: No Does patient usually wear dentures?: No  CIWA:  CIWA-Ar Total: 0 COWS:  COWS Total Score: 0  Musculoskeletal: Strength & Muscle Tone: within normal limits Gait & Station: normal Patient leans: N/A  Psychiatric Specialty Exam: Physical Exam  Nursing note and vitals reviewed. Constitutional: She is oriented to person, place, and time. She appears well-developed.  Neck: Normal range of motion.  Musculoskeletal: Normal range of motion.  Neurological: She is alert and oriented to person, place, and time.  Skin: Skin is dry.  Psychiatric: She has a normal mood and affect. Her behavior is normal.    Review of Systems  Psychiatric/Behavioral: Positive for depression. Negative for suicidal ideas. The patient is nervous/anxious.   All other systems reviewed and are negative.    Blood pressure 127/79, pulse 98, temperature 98 F (36.7 C), temperature source Oral, resp. rate 16, height 5' 4.02" (1.626 m), weight 90.447 kg (199 lb 6.4 oz), last menstrual period 08/05/2015, SpO2 98 %.Body mass index is 34.21 kg/(m^2).  General Appearance: Guarded  Eye Contact:  Fair  Speech:  Slow  Volume:  Normal  Mood:  Anxious and Depressed  Affect:  Depressed dysphoric , improving  Thought Process:  Linear and Descriptions of Associations: Intact  Orientation:  Full (Time, Place, and Person)  Thought Content: racing thoughts and confusion   Suicidal Thoughts:  No denies any suicidal ideations, denies any self injurious ideations ; able to contract for safety- however is depressed   Homicidal  Thoughts:  No  Memory:  recent and remote grossly intact immediate - fair  Judgement:  Fair   Insight: poor  Psychomotor Activity:  Restlessness  Concentration:  Concentration: Fair and Attention Span: Fair  Recall:  Good  Fund of Knowledge:  Good  Language:  Good  Akathisia:  Negative  Handed:  Right  AIMS (if indicated):   no akathisia, no abnormal involuntary movements   Assets:  Desire for Improvement Resilience  ADL's:  Intact  Cognition:  WNL  Sleep:  Number of Hours: 6      Treatment Plan Summary: Berenise Has been diagnosed with this schizoaffective disorder and most recent episode is increased symptoms of depressed tearful, withdrawn to her room mostly and has trouble taking care of ADLs as well as take her  meals or medications. Dr.Saranga -  has seen patient for a second opinion for forced medications. Patient continues to progress -will continue treatment.     Daily contact with patient to assess and evaluate symptoms and progress in treatment, Medication management, Plan inpatient admission  and medications as below     Continue Zyprexa to 2.5 mg PO/IM daily and 10  Mg PO /IM  qhs if she refuses PO for psychosis.  Patient is on Forced medication order. Will continue  Ativan 0.5 mg PO/IM bid for anxiety sx.Will taper off slowly based on progress. Will consider adding a mood stabilizer if patient agrees - will continue to educate . Will consider Li or trileptal . Continue Doxepin t 20 mg po qhs for insomnia. Will make available prn medications as per agitation protocol. Will continue to monitor vitals ,medication compliance and treatment side effects while patient is here.  Will monitor for medical issues as well as call consult as needed.  CSW will continue working on disposition.  Recreational therapy consult.see notes . Patient to participate in therapeutic milieu.   Roslynn Holte, MD 08/22/2015, 2:59 PM

## 2015-08-23 MED ORDER — LORAZEPAM 2 MG/ML IJ SOLN: 0.5000 mg | Freq: Every evening | INTRAMUSCULAR | Status: DC

## 2015-08-23 MED ORDER — LORAZEPAM 0.5 MG PO TABS
0.5000 mg | ORAL_TABLET | Freq: Every evening | ORAL | Status: DC
  Administered 2015-08-23 – 2015-08-27 (×5): 0.5 mg via ORAL
  Filled 2015-08-23 (×5): qty 1

## 2015-08-23 MED ORDER — DOXEPIN HCL 10 MG PO CAPS
20.0000 mg | ORAL_CAPSULE | Freq: Every evening | ORAL | Status: DC | PRN
Start: 2015-08-23 — End: 2015-08-28

## 2015-08-23 MED ORDER — OXCARBAZEPINE 150 MG PO TABS
150.0000 mg | ORAL_TABLET | Freq: Two times a day (BID) | ORAL | Status: DC
  Administered 2015-08-23 – 2015-08-28 (×11): 150 mg via ORAL
  Filled 2015-08-23 (×15): qty 1

## 2015-08-23 NOTE — [facility] Group Notes (Signed)
The focus of this group is to educate the patient on the purpose and policies of crisis stabilization and provide a format to answer questions about their admission.  The group details unit policies and expectations of patients while admitted.  Patient did not attend 0900 nurse education orientation group this morning.  Patient stayed in bed.   

## 2015-08-23 NOTE — Progress Notes (Signed)
Patient resting in bed, frequently isolative to room. Disheveled in appearance. Continues to avoid eye contact. Gives robotic answers to questions with little information forwarded. Affect blank, flat with congruent mood. On her self inventory she rates her depression and anxiety both at a 3/10, hopelessness at a 0/10. Rates her sleep as good, appetite fair, energy low and concentration poor. Denies pain, physical problems to this Clinical research associatewriter. Reports her goal for today is to "work on endurance slowly." She denies AVH.   Patient medicated per orders, education given. Emotional support offered. Encouraged to shower today, be more visible in milieu. Self inventory reviewed.   Patient states she showered yesterday afternoon and was up and out of room this AM. She is resistant to take medications and states, "I'm just so sensitive to them. I only want to take one." Patient did eventually comply. She denies SI/HI and remains safe on level III obs.

## 2015-08-23 NOTE — Tx Team (Signed)
Interdisciplinary Treatment Plan Update (Adult) Date: 08/23/2015   Date: 08/23/2015 1:12 PM  Progress in Treatment:  Attending groups: No Participating in groups: No Taking medication as prescribed: Yes  Tolerating medication: Yes  Family/Significant other contact made: Yes Patient understands diagnosis: Yes AEB seeking help with depression and hallucinations Discussing patient identified problems/goals with staff: Yes  Medical problems stabilized or resolved: Yes  Denies suicidal/homicidal ideation: Yes Patient has not harmed self or Others: Yes   New problem(s) identified: None identified at this time.   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    08/10/15: Pt is alert/oriented x4, however is anxious , tearful , speech is appropriate, but volume is soft . Pt reports having an episode last night when she was having AH - which she could not elaborate . However pt started crying when she started talking about it. Pt seems to be distressed by these voices. Pt also reports sleep as disrupted due to her having AH. Will increase Geodon to 60 mg po BID for psychosis - titrate higher slowly. EKG for qtc - wnl  Will continue Trileptal 300 mg po bid for mood sx. Trileptal was initiated in the ED. Will increase Trazodone to 125 mg po qhs for sleep. Continue Keflex 500 mg po tid for UTI.   08/15/15: Courtnei is depressed , withdrawn to her room , avoids eye contact , difficult to engage with , his thought process is concrete , she is tolerating her medications well. Discussed adding Li to  augment the other medications. She agrees.  Will continue Geodon to 60 mg po BID for psychosis Will continue Trileptal 300 mg po bid for mood sx. Trileptal was initiated in the ED   08/18/15:  .Haruye is depressed , withdrawn to her room mostly , is tearful at times , has sleep issues . She is also not eating well , and is not compliant on medications. Discussed with Dr.Saranga - who has seen patient for a second opinion for forced medications. Will start Zyprexa 5 Mg PO /IM if she refuses PO for psychosis. Pt is on Forced medication order. Discontinued Trileptal and Lithium since pt was refusing it for concerns of side effects. Increased Doxepin to 20 mg po qhs for sleep. Will add Lithium 150 mg po bid for augmenting the effect of Geodon. Li level in 5 days . Discussed ADRs of Li as well as the need for proper hydration. Will continue Trazodone125 mg po qhs for sleep. Will continue Keflex 500 mg po tid for UTI.- improving symptoms   6/27:  "I'm just ready to go . I don't want to talk about it , its a lot of things that I am thinking about." Continue Zyprexa to 2.5 mg PO/IM daily and 10 Mg PO /IM qhs if she refuses PO for psychosis.  Patient is on Forced medication order. Will change Ativan to 0.5 mg PO/IM to daily for anxiety sx.Will taper off slowly based on progress. Will consider adding a mood stabilizer if patient agrees - will continue to educate . Will start Trileptal 150 mg po bid  for mood sx. Continue Doxepin 20 mg po qhs, but change it to PRN for insomnia.   Reason for Continuation of Hospitalization:  Anxiety  Depression Medication stabilization Auditory Hallucinations  Estimated length of stay: 4-5 days  Review of initial/current patient goals per problem list:   1.  Goal(s): Patient will participate in aftercare plan  Met:  Yes  Target date: 3-5 days from date of admission   As evidenced by: Patient will participate within aftercare plan AEB aftercare  provider and housing plan at discharge being identified.   07/26/15: Pt will return home and follow-up with outpatient providers   2.  Goal (s): Patient will exhibit decreased depressive symptoms and suicidal ideations.  Met: Yes  Target date: 3-5 days from date of admission   As evidenced by: Patient will utilize self rating of depression at 3 or below and demonstrate decreased signs of depression or be deemed stable for discharge by MD. 08/05/15: Pt was admitted with symptoms of depression, rating 10/10.  08/10/15:  Rates depression a 5 today 07/1915:  Same rating today 08/18/15:  Continues to isolate in room in bed, poor appetite, not taking care of ADL's 08/23/14:  Rates depression a 3 today  3.  Goal(s): Patient will demonstrate decreased signs and symptoms of anxiety.  Met:  Yes  Target date: 3-5 days from date of admission   As evidenced by: Patient will utilize self rating of anxiety at 3 or below and demonstrated decreased signs of anxiety, or be deemed stable for discharge by MD 08/05/15;  Rates anxiety a 7 today 08/10/15:  Rates anxiety a 2 today  5.  Goal(s): Patient will demonstrate decreased signs of psychosis  . Met:  No but progressing . Target date: 3-5 days from date of admission  . As evidenced by: Patient will demonstrate decreased frequency of AVH or return to baseline function   -08/05/15 Pt endorses auditory hallucinations.   08/10/15:  Symptoms persist   08/15/15:  Symptoms persist-still spending a lot of time in bed isolating, poor group   attendance, not showering.   08/18/15: Has been refusing meds.  Was IVC'd today, and second opinion to force meds   obtained.  08/23/15:  Responding to the Zyprexa.  Has been to a group and in general is more engaged.  Attendees:  Patient:    Family:    Physician: Ursula Alert  08/23/2015 1:12 PM  Nursing: Grayland Ormond  08/23/2015 1:12 PM  Clinical Social Worker Rod Con[employer]o 08/23/2015 1:12 PM  Other:  08/23/2015 1:12 PM  Clinical:    08/23/2015 1:12 PM  Other: , RN Charge Nurse 08/23/2015 1:12 PM  Other: Hilda Lias, [facility]

## 2015-08-23 NOTE — Progress Notes (Signed)
[facility] MD Progress Note  08/23/2015 12:24 PM SONAM WANDEL  MRN:  [id number]  Subjective:  "I'm just ready to go . I don't want to talk about it , its a lot of things that I am thinking about."    Objective: [name] seen, chart reviewed and case discussed with the staff RN.  Pt is currently on forced medication order , seems to be progressing. She continues to have mood lability as well as anxiety - hence will consider a mood stabilizer. Pt today observed as anxious , withdrawn , eye contact is poor and answers questions in a limited fashion. Pt appears irritable , but does not want to talk about what is bothering her. Pt agrees to be started on trileptal today - will restart. Pt is tolerating zyprexa well - will continue to encourage and support.    Principal Problem: Schizoaffective disorder, bipolar type (HCC) Diagnosis:   Patient Active Problem List   Diagnosis Date Noted  . UTI (lower urinary tract infection) [N39.0]   . Schizoaffective disorder, bipolar type (HCC) [F25.0] 07/25/2015   Total Time spent with patient: 25 minutes  Past Medical History: Please see H&P.  Past Medical History  Diagnosis Date  . Bipolar 1 disorder (HCC)    History reviewed. No pertinent past surgical history. Family History:  Family History  Problem Relation Age of Onset  . Hypertension Mother   . Mental illness Father    Social History: Please see H&P.  History  Alcohol Use No     History  Drug Use No    Social History   Social History  . Marital Status: Single    Spouse Name: N/A  . Number of Children: N/A  . Years of Education: N/A   Social History Main Topics  . Smoking status: Never Smoker   . Smokeless tobacco: None  . Alcohol Use: No  . Drug Use: No  . Sexual Activity: No   Other Topics Concern  . None   Social History Narrative   Additional Social History:   Sleep: Fair  Appetite:  Patient has a poor appetite - needs encouragement    Current  Medications: Current Facility-Administered Medications  Medication Dose Route Frequency Provider Last Rate Last Dose  . acetaminophen (TYLENOL) tablet 650 mg  650 mg Oral Q6H PRN Sanjuana Kava, NP   650 mg at 08/09/15 0108  . alum & mag hydroxide-simeth (MAALOX/MYLANTA) 200-200-20 MG/5ML suspension 30 mL  30 mL Oral Q4H PRN Sanjuana Kava, NP      . doxepin (SINEQUAN) capsule 20 mg  20 mg Oral QHS PRN [name] Longs, MD      . feeding supplement (ENSURE ENLIVE) (ENSURE ENLIVE) liquid 237 mL  237 mL Oral Daily PRN [name] Longs, MD   237 mL at 08/23/15 0647  . hydrOXYzine (ATARAX/VISTARIL) tablet 25 mg  25 mg Oral Q6H PRN Kensli Bowley, MD      . LORazepam (ATIVAN) tablet 0.5 mg  0.5 mg Oral BH-q12n4p Khale Nigh, MD   0.5 mg at 08/23/15 1152   Or  . LORazepam (ATIVAN) injection 0.5 mg  0.5 mg Intramuscular BH-q12n4p Amere Iott, MD      . magnesium hydroxide (MILK OF MAGNESIA) suspension 30 mL  30 mL Oral Daily PRN Sanjuana Kava, NP   30 mL at 08/12/15 1716  . OLANZapine zydis (ZYPREXA) disintegrating tablet 10 mg  10 mg Oral QHS [name] Longs, MD   10 mg at 08/22/15 2140  Or  . OLANZapine (ZYPREXA) injection 10 mg  10 mg Intramuscular QHS Morena Mckissack, MD      . OLANZapine (ZYPREXA) tablet 2.5 mg  2.5 mg Oral QPC breakfast [name] LongsSaramma Valdemar Mcclenahan, MD   2.5 mg at 08/23/15 [id number]   Or  . OLANZapine (ZYPREXA) injection 2.5 mg  2.5 mg Intramuscular QPC breakfast Adolfo Granieri, MD      . OXcarbazepine (TRILEPTAL) tablet 150 mg  150 mg Oral BID [name] LongsSaramma Mayling Aber, MD   150 mg at 08/23/15 0909  . ziprasidone (GEODON) capsule 20 mg  20 mg Oral BID PRN [name] LongsSaramma Kalese Ensz, MD       Or  . ziprasidone (GEODON) injection 10 mg  10 mg Intramuscular BID PRN [name] LongsSaramma Brayli Klingbeil, MD       Lab Results:  No results found for this or any previous visit (from the past 48 hour(s)).  Blood Alcohol level:  Lab Results  Component Value Date   ETH <5 08/03/2015   ETH <5 07/25/2015   Physical Findings: AIMS: Facial and  Oral Movements Muscles of Facial Expression: None, normal Lips and Perioral Area: None, normal Jaw: None, normal Tongue: None, normal,Extremity Movements Upper (arms, wrists, hands, fingers): None, normal Lower (legs, knees, ankles, toes): None, normal, Trunk Movements Neck, shoulders, hips: None, normal, Overall Severity Severity of abnormal movements (highest score from questions above): None, normal Incapacitation due to abnormal movements: None, normal Patient's awareness of abnormal movements (rate only patient's report): No Awareness, Dental Status Current problems with teeth and/or dentures?: No Does patient usually wear dentures?: No  CIWA:  CIWA-Ar Total: 0 COWS:  COWS Total Score: 0  Musculoskeletal: Strength & Muscle Tone: within normal limits Gait & Station: normal Patient leans: N/A  Psychiatric Specialty Exam: Physical Exam  Nursing note and vitals reviewed. Constitutional: She is oriented to person, place, and time. She appears well-developed.  Neck: Normal range of motion.  Musculoskeletal: Normal range of motion.  Neurological: She is alert and oriented to person, place, and time.  Skin: Skin is dry.  Psychiatric: She has a normal mood and affect. Her behavior is normal.    Review of Systems  Psychiatric/Behavioral: Positive for depression. Negative for suicidal ideas. The patient is nervous/anxious.   All other systems reviewed and are negative.    Blood pressure 140/90, pulse 93, temperature 98.2 F (36.8 C), temperature source Oral, resp. rate 16, height 5' 4.02" (1.626 m), weight 90.447 kg (199 lb 6.4 oz), last menstrual period 08/05/2015, SpO2 98 %.Body mass index is 34.21 kg/(m^2).  General Appearance: Guarded  Eye Contact:  Fair  Speech:  Slow  Volume:  Normal  Mood:  Anxious and Depressed  Affect:  Depressed dysphoric , improving  Thought Process:  Linear and Descriptions of Associations: Intact  Orientation:  Full (Time, Place, and Person)   Thought Content: racing thoughts and confusion   Suicidal Thoughts:  No denies any suicidal ideations, denies any self injurious ideations ; able to contract for safety- however is depressed   Homicidal Thoughts:  No  Memory:  recent and remote grossly intact immediate - fair  Judgement:  Fair   Insight: poor  Psychomotor Activity:  Restlessness  Concentration:  Concentration: Fair and Attention Span: Fair  Recall:  Good  Fund of Knowledge:  Good  Language:  Good  Akathisia:  Negative  Handed:  Right  AIMS (if indicated):   no akathisia, no abnormal involuntary movements   Assets:  Desire for Improvement Resilience  ADL's:  Intact  Cognition:  WNL  Sleep:  Number of Hours: 6.75      Treatment Plan Summary: [name] Has been diagnosed with this schizoaffective disorder and most recent episode is increased symptoms of depressed tearful, withdrawn to her room mostly and has trouble taking care of ADLs as well as take her meals or medications. Dr.Saranga -  has seen patient for a second opinion for forced medications. Patient continues to be irritable and anxious today -will continue treatment.     Daily contact with patient to assess and evaluate symptoms and progress in treatment, Medication management, Plan inpatient admission  and medications as below     Continue Zyprexa to 2.5 mg PO/IM daily and 10  Mg PO /IM  qhs if she refuses PO for psychosis.  Patient is on Forced medication order. Will change Ativan to 0.5 mg PO/IM to daily for anxiety sx.Will taper off slowly based on progress. Will consider adding a mood stabilizer if patient agrees - will continue to educate . Will start Trileptal 150 mg po bid for mood sx. Continue Doxepin  20 mg po qhs, but change it to PRN  for insomnia. Will make available prn medications as per agitation protocol. Will continue to monitor vitals ,medication compliance and treatment side effects while patient is here.  Will monitor for medical  issues as well as call consult as needed.  CSW will continue working on disposition.  Recreational therapy consult.see notes . Patient to participate in therapeutic milieu.   Aiya Keach, MD 08/23/2015, 12:24 PM

## 2015-08-23 NOTE — Progress Notes (Signed)
D. Pt had been in room and in bed for much of the evening, chose not to attend evening group activity. She spoke about how she is feeling a little better but did appear withdrawn and did not give very good eye contact. She was encouraged to take all her medications as she initially did not want to do so and did not verbalize any complaints of pain. A. Support and encouragement provided. R. Safety maintained, will continue to monitor.

## 2015-08-23 NOTE — Plan of Care (Signed)
Problem: Coping: Goal: Ability to verbalize frustrations and anger appropriately will improve Outcome: Progressing Patient verbalizes frustration over medication regimen. Appropriately expresses.  Problem: Medication: Goal: Compliance with prescribed medication regimen will improve Outcome: Progressing Patient resistant however med compliant.

## 2015-08-23 NOTE — [facility] Group Notes (Signed)
[facility] LCSW Group Therapy  08/23/2015 1:11 PM   Type of Therapy:  Group Therapy  Participation Level:  Active  Participation Quality:  Attentive  Affect:  Appropriate  Cognitive:  Appropriate  Insight:  Improving  Engagement in Therapy:  Engaged  Modes of Intervention:  Clarification, Education, Exploration and Socialization  Summary of Progress/Problems: Today's group focused on resilience. Invited.  Chose to not attend. [name], Oanh Devivo B 08/23/2015 , 1:11 PM

## 2015-08-23 NOTE — Progress Notes (Signed)
The focus of this group is to help patients review their daily goal of treatment and discuss progress on daily workbooks.  Patient did not attend group 

## 2015-08-23 NOTE — Progress Notes (Signed)
D:Patient in bed on approch.  Patient states she is ok.  Patient avoids eye contact.  Patient is pleasant and cooperative but behavior is bizarre. Patient denies SI/HI and denies AVH.  A: Staff to monitor Q 15 mins for safety.  Encouragement and support offered.  Scheduled medications administered per orders. R: Patient remains safe on the unit.  Patient did not attend group tonight.  Patient not visible on th unit.  Patient taking administered medications.

## 2015-08-23 NOTE — Progress Notes (Signed)
Recreation Therapy Notes   Animal-Assisted Activity (AAA) Program Checklist/Progress Notes Patient Eligibility Criteria Checklist & Daily Group note for Rec Tx Intervention  Date: 06.27.2017 Time: 2:45pm [address]    AAA/T Program Assumption of Risk Form signed by Patient/ or Parent Legal Guardian NO  Behavioral Response: Did not attend. Patient discussed with MD for appropriateness in pet therapy session. Both LRT and MD agree patient is appropriate for participation. Patient offered attendance to session, patient politely declined. Patient seated in dayroom watching TV when approached. Patient affect euthymic, eye contact improved.   Marykay Lexenise L Savaya Hakes, LRT/CTRS         Amie Cowens L 08/23/2015 3:09 PM

## 2015-08-24 NOTE — Progress Notes (Signed)
[facility] Group Notes:  (Nursing/MHT/Case Management/Adjunct)  Date:  08/24/2015  Time:  8:26 PM  Type of Therapy:  Psychoeducational Skills  Participation Level:  Active  Participation Quality:  Appropriate  Affect:  Blunted  Cognitive:  Appropriate  Insight:  Improving  Engagement in Group:  Engaged  Modes of Intervention:  Education  Summary of Progress/Problems: The patient expressed in group that she had a good day since she was able to walk quite a bit in the hallway. As a theme for the day, her plan for development following discharge is to work on her stress management.   Hazle CocaGOODMAN, Charlene Detter S 08/24/2015, 8:26 PM

## 2015-08-24 NOTE — Progress Notes (Signed)
[facility] Progress Note  08/24/2015 1:26 PM Jessica ChaletJennifer L [name]  MRN:  [id number]  Subjective:  Pt states" I am ok.'     Objective: Jessica [name] seen, chart reviewed and case discussed with the staff RN.  Pt is currently on forced medication order , seems to be progressing. Pt often seen in bed , withdrawn , however has been going to cafeteria for meals which is an improvement. Pt also does not attend groups . Pt currently tolerating zyprexa - will continue to encourage and support.    Principal Problem: Schizoaffective disorder, bipolar type (HCC) Diagnosis:   Patient Active Problem List   Diagnosis Date Noted  . UTI (lower urinary tract infection) [N39.0]   . Schizoaffective disorder, bipolar type (HCC) [F25.0] 07/25/2015   Total Time spent with patient: 25 minutes  Past Medical History: Please see H&P.  Past Medical History  Diagnosis Date  . Bipolar 1 disorder (HCC)    History reviewed. No pertinent past surgical history. Family History:  Family History  Problem Relation Age of Onset  . Hypertension Mother   . Mental illness Father    Social History: Please see H&P.  History  Alcohol Use No     History  Drug Use No    Social History   Social History  . Marital Status: Single    Spouse Name: N/A  . Number of Children: N/A  . Years of Education: N/A   Social History Main Topics  . Smoking status: Never Smoker   . Smokeless tobacco: None  . Alcohol Use: No  . Drug Use: No  . Sexual Activity: No   Other Topics Concern  . None   Social History Narrative   Additional Social History:   Sleep: Fair  Appetite:  Patient has a poor appetite - needs encouragement    Current Medications: Current Facility-Administered Medications  Medication Dose Route Frequency Provider Last Rate Last Dose  . acetaminophen (TYLENOL) tablet 650 mg  650 mg Oral Q6H PRN Sanjuana KavaAgnes I Nwoko, NP   650 mg at 08/09/15 0108  . alum & mag hydroxide-simeth (MAALOX/MYLANTA)  200-200-20 MG/5ML suspension 30 mL  30 mL Oral Q4H PRN Sanjuana KavaAgnes I Nwoko, NP      . doxepin (SINEQUAN) capsule 20 mg  20 mg Oral QHS PRN [name] LongsSaramma Dena Esperanza, MD      . feeding supplement (ENSURE ENLIVE) (ENSURE ENLIVE) liquid 237 mL  237 mL Oral Daily PRN [name] LongsSaramma Cato Liburd, MD   237 mL at 08/23/15 0647  . hydrOXYzine (ATARAX/VISTARIL) tablet 25 mg  25 mg Oral Q6H PRN [name] LongsSaramma Jordon Kristiansen, MD      . LORazepam (ATIVAN) tablet 0.5 mg  0.5 mg Oral QPM Anne Sebring, MD   0.5 mg at 08/23/15 1653   Or  . LORazepam (ATIVAN) injection 0.5 mg  0.5 mg Intramuscular QPM Constantin Hillery, MD      . magnesium hydroxide (MILK OF MAGNESIA) suspension 30 mL  30 mL Oral Daily PRN Sanjuana KavaAgnes I Nwoko, NP   30 mL at 08/12/15 1716  . OLANZapine zydis (ZYPREXA) disintegrating tablet 10 mg  10 mg Oral QHS [name] LongsSaramma Parnika Tweten, MD   10 mg at 08/23/15 2121   Or  . OLANZapine (ZYPREXA) injection 10 mg  10 mg Intramuscular QHS Laiah Pouncey, MD      . OXcarbazepine (TRILEPTAL) tablet 150 mg  150 mg Oral BID [name] LongsSaramma Ginna Schuur, MD   150 mg at 08/24/15 [id number]  . ziprasidone (GEODON) capsule 20 mg  20 mg Oral BID  PRN [name] LongsSaramma Larrissa Stivers, MD       Or  . ziprasidone (GEODON) injection 10 mg  10 mg Intramuscular BID PRN [name] LongsSaramma Raymund Manrique, MD       Lab Results:  No results found for this or any previous visit (from the past 48 hour(s)).  Blood Alcohol level:  Lab Results  Component Value Date   ETH <5 08/03/2015   ETH <5 07/25/2015   Physical Findings: AIMS: Facial and Oral Movements Muscles of Facial Expression: None, normal Lips and Perioral Area: None, normal Jaw: None, normal Tongue: None, normal,Extremity Movements Upper (arms, wrists, hands, fingers): None, normal Lower (legs, knees, ankles, toes): None, normal, Trunk Movements Neck, shoulders, hips: None, normal, Overall Severity Severity of abnormal movements (highest score from questions above): None, normal Incapacitation due to abnormal movements: None, normal Patient's awareness of abnormal  movements (rate only patient's report): No Awareness, Dental Status Current problems with teeth and/or dentures?: No Does patient usually wear dentures?: No  CIWA:  CIWA-Ar Total: 0 COWS:  COWS Total Score: 0  Musculoskeletal: Strength & Muscle Tone: within normal limits Gait & Station: normal Patient leans: N/A  Psychiatric Specialty Exam: Physical Exam  Nursing note and vitals reviewed. Constitutional: She is oriented to person, place, and time. She appears well-developed.  Neck: Normal range of motion.  Musculoskeletal: Normal range of motion.  Neurological: She is alert and oriented to person, place, and time.  Skin: Skin is dry.  Psychiatric: She has a normal mood and affect. Her behavior is normal.    Review of Systems  Psychiatric/Behavioral: Positive for depression. Negative for suicidal ideas. The patient is nervous/anxious.   All other systems reviewed and are negative.    Blood pressure 129/75, pulse 87, temperature 97.7 F (36.5 C), temperature source Oral, resp. rate 16, height 5' 4.02" (1.626 m), weight 90.447 kg (199 lb 6.4 oz), last menstrual period 08/05/2015, SpO2 98 %.Body mass index is 34.21 kg/(m^2).  General Appearance: Guarded  Eye Contact:  Fair  Speech:  Slow  Volume:  Normal  Mood:  Anxious and Depressed  Affect:  Depressed dysphoric , improving  Thought Process:  Linear and Descriptions of Associations: Intact  Orientation:  Full (Time, Place, and Person)  Thought Content: racing thoughts , AH - improving  Suicidal Thoughts:  No denies any suicidal ideations, denies any self injurious ideations ; able to contract for safety- however is depressed   Homicidal Thoughts:  No  Memory:  recent and remote grossly intact immediate - fair  Judgement:  Fair   Insight: poor  Psychomotor Activity:  Restlessness  Concentration:  Concentration: Fair and Attention Span: Fair  Recall:  Good  Fund of Knowledge:  Good  Language:  Good  Akathisia:  Negative   Handed:  Right  AIMS (if indicated):   no akathisia, no abnormal involuntary movements   Assets:  Desire for Improvement Resilience  ADL's:  Intact  Cognition:  WNL  Sleep:  Number of Hours: 6.75      Treatment Plan Summary: Jessica [name] Has been diagnosed with this schizoaffective disorder and most recent episode is increased symptoms of depressed tearful, withdrawn to her room mostly and has trouble taking care of ADLs as well as take her meals or medications. Dr.Saranga -  has seen patient for a second opinion for forced medications. Patient continues to be  anxious today -will continue treatment.     Daily contact with patient to assess and evaluate symptoms and progress in treatment, Medication management, Plan inpatient admission  and medications as below     Will discontinue AM  Zyprexa 2.5 mg PO/IM daily - since that may causing her to be slowed down and drowsy during the day. Will continue Zyprexa10  Mg PO /IM  qhs for psychosis. Plan to increase to 15 mg po qhs tomorrow. Patient is on Forced medication order. Will continue Ativan to 0.5 mg PO/IM to daily for anxiety sx.Will taper off slowly based on progress. Will continue Trileptal 150 mg po bid for mood sx. Continue Doxepin  20 mg po qhs, but change it to PRN  for insomnia. Will make available prn medications as per agitation protocol. Will continue to monitor vitals ,medication compliance and treatment side effects while patient is here.  Will monitor for medical issues as well as call consult as needed.  CSW will continue working on disposition.  Recreational therapy consult.see notes . Patient to participate in therapeutic milieu.   Dearra Myhand, MD 08/24/2015, 1:26 PM

## 2015-08-24 NOTE — Plan of Care (Signed)
Problem: Safety: Goal: Ability to disclose and discuss suicidal ideas will improve Outcome: Progressing Denies SI   

## 2015-08-24 NOTE — [facility] Group Notes (Signed)
[facility]   08/24/2015 1:23 PM  Participation Quality:  Engaged  Mood/Affect:  Flat  Depression Rating:    Anxiety Rating:    Thoughts of Suicide:  No Will you contract for safety?   NA  Current AVH:  denies  Plan for Discharge/Comments:  "I'm doing a lot better today.  thanx for asking."  Stayed the entire time.  Good eye contact.  Dressed.  Transportation Means:   Supports:  Jessica [name], Jessica [name]

## 2015-08-24 NOTE — Progress Notes (Signed)
Patient ID: Jessica [name], female   DOB: 06/05/1975, [age] y.o.   MRN: [id number]  DAR: Pt. Denies SI/HI and A/V Hallucinations. She is able to make brief eye contact with writer this morning and appears to be making more eye contact as the day progresses. She is pleasant upon interaction and smiles intermittently. She reports sleep is good, appetite is good, energy level is low, and concentration is good. She rates depression 3/10, hopelessness 0/10, and anxiety 3/10. Patient does not report any pain or discomfort at this time. Support and encouragement provided to the patient. Scheduled medications administered to patient per physician's orders. No PRN medications necessary at this time. Patient keeps to herself but does attend some groups. She reports her goal today is stress management and she will use deep breathing to help reach this goal. Q15 minute checks are maintained for safety.

## 2015-08-24 NOTE — Progress Notes (Signed)
Recreation Therapy Notes  06.28.2017 approximately 2:30pm. Patient encountered in dayroom, flat affect, makes no eye contact with LRT. Patient offered participation in stress management techniques.  Patient declined, politely but curtly. LRT will continue to attempt to work with patient during admission. Marykay Lexenise L Shirlena Brinegar, LRT/CTRS    Jearl KlinefelterBlanchfield, Giulliana Mcroberts L 08/24/2015 3:29 PM

## 2015-08-24 NOTE — [facility] Group Notes (Signed)
[facility]  08/24/2015 , 1:24 PM    Type of Therapy:  [facility] Presentation  Participation Level:  Active  Participation Quality:  Attentive  Affect:  Blunted  Cognitive:  Oriented  Insight:  Limited  Engagement in Therapy:  Engaged  Modes of Intervention:  Discussion, Education and Socialization  Summary of Progress/Problems:  Jessica [name] from [employer] came to present his recovery story and play the guitar.  Came initially.  Stayed for a bit, left, and did not return.  Jessica [name], Jessica [name] 08/24/2015 , 1:24 PM

## 2015-08-25 MED ORDER — OLANZAPINE 10 MG IM SOLR
12.5000 mg | Freq: Every day | INTRAMUSCULAR | Status: DC
  Filled 2015-08-25 (×5): qty 20

## 2015-08-25 MED ORDER — OLANZAPINE 5 MG PO TABS
12.5000 mg | ORAL_TABLET | Freq: Every day | ORAL | Status: DC
  Administered 2015-08-25 – 2015-08-27 (×3): 12.5 mg via ORAL
  Filled 2015-08-25 (×5): qty 2.5

## 2015-08-25 NOTE — Progress Notes (Signed)
Recreation Therapy Notes  06.29.2017 approximately 3:00pm. LRT returned to work on stress management techniques with patient. Patient declines recreation therapy tx at this time. Patient polite, but curt when declining. Patient appears to not have showered recently. Patient reports she showered yesterday, but has not showered yet today, LRT encouraged patient to shower and tend to ADL's. Patient agreed.   Jessica [name], LRT/CTRS     Stevan Eberwein L 08/25/2015 5:00 PM

## 2015-08-25 NOTE — Progress Notes (Signed)
Pt has been in her room in bed most of the evening.  She reports she is doing better and denies SI/HI/AVH.  Conversation was minimal, and pt forwards little information about her day.  She did inquire about her meds for the night, so writer spoke with pt about her meds.  Her eye contact was better during our brief conversation as it was reported that in the previous days, pt has had very poor eye contact.  Support and encouragement offered.  Discharge plans are in process.  Pt was encouraged to make her needs known to staff.  Safety maintained with q15 minute checks.

## 2015-08-25 NOTE — [facility] Group Notes (Signed)
[facility] Group Notes:  (Counselor/Nursing/MHT/Case Management/Adjunct)  08/25/2015 1:15PM  Type of Therapy:  Group Therapy  Participation Level:  Active  Participation Quality:  Appropriate  Affect:  Flat  Cognitive:  Oriented  Insight:  Improving  Engagement in Group:  Limited  Engagement in Therapy:  Limited  Modes of Intervention:  Discussion, Exploration and Socialization  Summary of Progress/Problems: The topic for group was balance in life.  Pt participated in the discussion about when their life was in balance and out of balance and how this feels.  Pt discussed ways to get back in balance and short term goals they can work on to get where they want to be.  Stayed the entire time, engaged throughout.  "I'm concerned about my job.  They may tell me they filled my position with someone else since i have been gone for so long."  Other patients problem solved with her, including talking about the disability application process.  She stated she had applied in the past but turned down.  It was pointed out she has had hospitalizations since then, and was encouraged to use a an atty. this time by those "in the know."   Jessica [name], Jessica [name] 08/25/2015 3:47 PM

## 2015-08-25 NOTE — Progress Notes (Signed)
[facility] MD Progress Note  08/25/2015 12:17 PM Jessica [name]  MRN:  [id number]  Subjective:  Pt states" I am too weak to do all that you are talking about now . I am not eating a a lot and I do not have a lot of strength now. "      Objective: [name] seen, chart reviewed and case discussed with the staff RN.  Pt is currently on forced medication order , seems to be progressing. Pt today seen as depressed , has a flat affect - states she is making an attempt to be more visible on the unit . Per staff - pt was seen on the unit more than previous days and she is also going to the cafeteria for her meals.Pt continues to talk in short monosyllables , her tone is mostly robotic when answering questions. Pt has been tolerating her medications well, denies ADRs. Pt currently tolerating zyprexa - will continue to encourage and support to attend groups and participate in milieu.    Principal Problem: Schizoaffective disorder, bipolar type (HCC) Diagnosis:   Patient Active Problem List   Diagnosis Date Noted  . UTI (lower urinary tract infection) [N39.0]   . Schizoaffective disorder, bipolar type (HCC) [F25.0] 07/25/2015   Total Time spent with patient: 25 minutes  Past Medical History: Please see H&P.  Past Medical History  Diagnosis Date  . Bipolar 1 disorder (HCC)    History reviewed. No pertinent past surgical history. Family History:  Family History  Problem Relation Age of Onset  . Hypertension Mother   . Mental illness Father    Social History: Please see H&P.  History  Alcohol Use No     History  Drug Use No    Social History   Social History  . Marital Status: Single    Spouse Name: N/A  . Number of Children: N/A  . Years of Education: N/A   Social History Main Topics  . Smoking status: Never Smoker   . Smokeless tobacco: None  . Alcohol Use: No  . Drug Use: No  . Sexual Activity: No   Other Topics Concern  . None   Social History Narrative    Additional Social History:   Sleep: Fair  Appetite:  Patient has a poor appetite - improving - seen as going to cafeteria   Current Medications: Current Facility-Administered Medications  Medication Dose Route Frequency Provider Last Rate Last Dose  . acetaminophen (TYLENOL) tablet 650 mg  650 mg Oral Q6H PRN Sanjuana Kava, NP   650 mg at 08/09/15 0108  . alum & mag hydroxide-simeth (MAALOX/MYLANTA) 200-200-20 MG/5ML suspension 30 mL  30 mL Oral Q4H PRN Sanjuana Kava, NP      . doxepin (SINEQUAN) capsule 20 mg  20 mg Oral QHS PRN [name] Longs, MD      . feeding supplement (ENSURE ENLIVE) (ENSURE ENLIVE) liquid 237 mL  237 mL Oral Daily PRN [name] Longs, MD   237 mL at 08/23/15 0647  . hydrOXYzine (ATARAX/VISTARIL) tablet 25 mg  25 mg Oral Q6H PRN [name] Longs, MD      . LORazepam (ATIVAN) tablet 0.5 mg  0.5 mg Oral QPM Kaari Zeigler, MD   0.5 mg at 08/24/15 1722   Or  . LORazepam (ATIVAN) injection 0.5 mg  0.5 mg Intramuscular QPM Malikye Reppond, MD      . magnesium hydroxide (MILK OF MAGNESIA) suspension 30 mL  30 mL Oral Daily PRN Sanjuana Kava, NP  30 mL at 08/12/15 1716  . OLANZapine (ZYPREXA) tablet 12.5 mg  12.5 mg Oral QHS [name] LongsSaramma Theodore Rahrig, MD       Or  . OLANZapine (ZYPREXA) injection 12.5 mg  12.5 mg Intramuscular QHS Wissam Resor, MD      . OXcarbazepine (TRILEPTAL) tablet 150 mg  150 mg Oral BID [name] LongsSaramma Douglass Dunshee, MD   150 mg at 08/25/15 0818  . ziprasidone (GEODON) capsule 20 mg  20 mg Oral BID PRN [name] LongsSaramma Sherman Lipuma, MD       Or  . ziprasidone (GEODON) injection 10 mg  10 mg Intramuscular BID PRN [name] LongsSaramma Deirdra Heumann, MD       Lab Results:  No results found for this or any previous visit (from the past 48 hour(s)).  Blood Alcohol level:  Lab Results  Component Value Date   ETH <5 08/03/2015   ETH <5 07/25/2015   Physical Findings: AIMS: Facial and Oral Movements Muscles of Facial Expression: None, normal Lips and Perioral Area: None, normal Jaw: None,  normal Tongue: None, normal,Extremity Movements Upper (arms, wrists, hands, fingers): None, normal Lower (legs, knees, ankles, toes): None, normal, Trunk Movements Neck, shoulders, hips: None, normal, Overall Severity Severity of abnormal movements (highest score from questions above): None, normal Incapacitation due to abnormal movements: None, normal Patient's awareness of abnormal movements (rate only patient's report): No Awareness, Dental Status Current problems with teeth and/or dentures?: No Does patient usually wear dentures?: No  CIWA:  CIWA-Ar Total: 0 COWS:  COWS Total Score: 0  Musculoskeletal: Strength & Muscle Tone: within normal limits Gait & Station: normal Patient leans: N/A  Psychiatric Specialty Exam: Physical Exam  Nursing note and vitals reviewed. Constitutional: She is oriented to person, place, and time. She appears well-developed.  Neck: Normal range of motion.  Musculoskeletal: Normal range of motion.  Neurological: She is alert and oriented to person, place, and time.  Skin: Skin is dry.  Psychiatric: She has a normal mood and affect. Her behavior is normal.    Review of Systems  Psychiatric/Behavioral: Positive for depression. Negative for suicidal ideas. The patient is nervous/anxious.   All other systems reviewed and are negative.    Blood pressure 119/83, pulse 96, temperature 98.6 F (37 C), temperature source Oral, resp. rate 20, height 5' 4.02" (1.626 m), weight 90.447 kg (199 lb 6.4 oz), last menstrual period 08/05/2015, SpO2 98 %.Body mass index is 34.21 kg/(m^2).  General Appearance: Guarded  Eye Contact:  Fair  Speech:  Slow  Volume:  Normal  Mood:  Anxious and Depressed  Affect:  Depressed dysphoric , improving  Thought Process:  Linear and Descriptions of Associations: Intact  Orientation:  Full (Time, Place, and Person)  Thought Content: racing thoughts , AH - improving  Suicidal Thoughts:  No denies any suicidal ideations, denies any  self injurious ideations ; able to contract for safety- however is depressed   Homicidal Thoughts:  No  Memory:  recent and remote grossly intact immediate - fair  Judgement:  Fair   Insight: poor  Psychomotor Activity:  Restlessness  Concentration:  Concentration: Fair and Attention Span: Fair  Recall:  Good  Fund of Knowledge:  Good  Language:  Good  Akathisia:  Negative  Handed:  Right  AIMS (if indicated):   no akathisia, no abnormal involuntary movements   Assets:  Desire for Improvement Resilience  ADL's:  Intact  Cognition:  WNL  Sleep:  Number of Hours: 6.5      Treatment Plan Summary: Jessica DikeJennifer Has been  diagnosed with this schizoaffective disorder and most recent episode is increased symptoms of depressed tearful, withdrawn to her room mostly and has trouble taking care of ADLs as well as take her meals or medications. Dr.Saranga -  has seen patient for a second opinion for forced medications. Patient continues to be  anxious today -will continue treatment.     Daily contact with patient to assess and evaluate symptoms and progress in treatment, Medication management, Plan inpatient admission  and medications as below     Discontinued AM  Zyprexa 2.5 mg PO/IM daily - since that may causing her to be slowed down and drowsy during the day. Will increase Zyprexa to 12.5 mg PO /IM  qhs for psychosis. Patient is on Forced medication order. Will continue Ativan to 0.5 mg PO/IM to daily for anxiety sx.Will taper off slowly based on progress. Will continue Trileptal 150 mg po bid for mood sx. Continue Doxepin  20 mg po qhs, but change it to PRN  for insomnia. Will make available prn medications as per agitation protocol. Will continue to monitor vitals ,medication compliance and treatment side effects while patient is here.  Will monitor for medical issues as well as call consult as needed.  CSW will continue working on disposition.  Recreational therapy consult.see notes  . Patient to participate in therapeutic milieu.   Erisha Paugh, MD 08/25/2015, 12:17 PM

## 2015-08-25 NOTE — [facility] Group Notes (Signed)
Pt did not attend karaoke group.  Everett Ehrler, MHT  

## 2015-08-25 NOTE — Progress Notes (Signed)
DAR NOTE: Patient mood and affect was bright.  Patient made good eye contact during assessment.  Denies pain, auditory and visual hallucinations.  Rates depression at 3, hopelessness at 0, and anxiety at 3.  Maintained on routine safety checks.  Medications given as prescribed.  Support and encouragement offered as needed.  Attended group and participated.  States goal for today is "to work on stress management."  Patient was visible during milieu activities most of this shift.

## 2015-08-25 NOTE — Progress Notes (Signed)
D:Patient in bed on first approach.  Patient states she is feeling better.  Patient states she hopes she can go home soon.  Patient still is avoiding eye contact.  It appears that patient showered today.  Patient denies SI/HI and denies AVH.   A: Staff to monitor Q 15 mins for safety.  Encouragement and support offered.  Scheduled medications administered per orders. R: Patient remains safe on the unit.  Patient did not attend group tonight.  Patient visible on the unit for bedtime medications.  Patient taking administered medications.

## 2015-08-26 NOTE — Progress Notes (Signed)
DAR NOTE: Patient is calm and pleasant.  Mood and affect is bright. Denies pain, auditory and visual hallucinations.  Rates depression at 3, hopelessness at 0, and anxiety at 3.  Described energy level as low and concentration as poor.  Maintained on routine safety checks.  Medications given as prescribed.  Support and encouragement offered as needed.  Attended group and participated.  States goal for today is "to work on being able to relax."  Patient was visible in the milieu activities most of this shift.  Offered no complaint.

## 2015-08-26 NOTE — Progress Notes (Signed)
[facility] Group Notes:  (Nursing/MHT/Case Management/Adjunct)  Date:  08/26/2015  Time:  9:27 PM  Type of Therapy:  Psychoeducational Skills  Participation Level:  Minimal  Participation Quality:  Attentive  Affect:  Flat  Cognitive:  Appropriate  Insight:  Improving  Engagement in Group:  Limited  Modes of Intervention:  Education  Summary of Progress/Problems: The patient stated that she had good day since she attended her afternoon groups. As for the theme of the day, her relapse prevention will include using "deep breathing" and attempting to get more rest.   Rodriquez Thorner S 08/26/2015, 9:27 PM

## 2015-08-26 NOTE — [facility] Group Notes (Signed)
[facility] LCSW Group Therapy  08/26/2015  1:05 PM  Type of Therapy:  Group therapy  Participation Level:  Active  Participation Quality:  Attentive  Affect:  Flat  Cognitive:  Oriented  Insight:  Limited  Engagement in Therapy:  Limited  Modes of Intervention:  Discussion, Socialization  Summary of Progress/Problems:  Chaplain was here to lead a group on themes of hope and courage.  "Hope is positivity.  It helps me get through difficult days.  Usually it's faith related, but i don't know right now."  Stayed the entire time, spent most of the time staring at the floor.  Minimal eye contact.  [name], [name] 08/26/2015 1:28 PM

## 2015-08-26 NOTE — Progress Notes (Signed)
Recreation Therapy Notes  06.30.2017 approximately 9:45am. LRT returned to work on stress management techniques with patient. Patient declines recreation therapy tx at this time. Patient polite, but curt when declining.    Marykay Lexenise L Khalon Cansler, LRT/CTRS         Jearl KlinefelterBlanchfield, Hildy Nicholl L 08/26/2015 3:36 PM

## 2015-08-26 NOTE — Tx Team (Signed)
Interdisciplinary Treatment Plan Update (Adult) Date: 08/26/2015   Date: 08/26/2015 10:32 AM  Progress in Treatment:  Attending groups: No Participating in groups: No Taking medication as prescribed: Yes  Tolerating medication: Yes  Family/Significant other contact made: Yes Patient understands diagnosis: Yes AEB seeking help with depression and hallucinations Discussing patient identified problems/goals with staff: Yes  Medical problems stabilized or resolved: Yes  Denies suicidal/homicidal ideation: Yes Patient has not harmed self or Others: Yes   New problem(s) identified: None identified at this time.   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    08/10/15: Pt is alert/oriented x4, however is anxious , tearful , speech is appropriate, but volume is soft . Pt reports having an episode last night when she was having AH - which she could not elaborate . However pt started crying when she started talking about it. Pt seems to be distressed by these voices. Pt also reports sleep as disrupted due to her having AH. Will increase Geodon to 60 mg po BID for psychosis - titrate higher slowly. EKG for qtc - wnl  Will continue Trileptal 300 mg po bid for mood sx. Trileptal was initiated in the ED. Will increase Trazodone to 125 mg po qhs for sleep. Continue Keflex 500 mg po tid for UTI.   08/15/15: Valley is depressed , withdrawn to her room , avoids eye contact , difficult to engage with , his thought process is concrete , she is tolerating her medications well. Discussed adding Li to  augment the other medications. She agrees.  Will continue Geodon to 60 mg po BID for psychosis Will continue Trileptal 300 mg po bid for mood sx. Trileptal was initiated in the ED   08/18/15:  .Gwenn is depressed , withdrawn to her room mostly , is tearful at times , has sleep issues . She is also not eating well , and is not compliant on medications. Discussed with Dr.Saranga - who has seen patient for a second opinion for forced medications. Will start Zyprexa 5 Mg PO /IM if she refuses PO for psychosis. Pt is on Forced medication order. Discontinued Trileptal and Lithium since pt was refusing it for concerns of side effects. Increased Doxepin to 20 mg po qhs for sleep. Will add Lithium 150 mg po bid for augmenting the effect of Geodon. Li level in 5 days . Discussed ADRs of Li as well as the need for proper hydration. Will continue Trazodone125 mg po qhs for sleep. Will continue Keflex 500 mg po tid for UTI.- improving symptoms   6/27:  "I'm just ready to go . I don't want to talk about it , its a lot of things that I am thinking about." Continue Zyprexa to 2.5 mg PO/IM daily and 10 Mg PO /IM qhs if she refuses PO for psychosis.  Patient is on Forced medication order. Will change Ativan to 0.5 mg PO/IM to daily for anxiety sx.Will taper off slowly based on progress. Will consider adding a mood stabilizer if patient agrees - will continue to educate . Will start Trileptal 150 mg po bid  for mood sx. Continue Doxepin 20 mg po qhs, but change it to PRN for insomnia.   Reason for Continuation of Hospitalization:  Anxiety  Depression Medication stabilization Auditory Hallucinations  Estimated length of stay: Likley d/c tomorrow  Review of initial/current patient goals per problem list:   1.  Goal(s): Patient will participate in aftercare plan  Met:  Yes  Target date: 3-5 days from date of admission   As evidenced by: Patient will participate within aftercare plan AEB  aftercare provider and housing plan at discharge being identified.   07/26/15: Pt will return home and follow-up with outpatient providers   2.  Goal (s): Patient will exhibit decreased depressive symptoms and suicidal ideations.  Met: Yes  Target date: 3-5 days from date of admission   As evidenced by: Patient will utilize self rating of depression at 3 or below and demonstrate decreased signs of depression or be deemed stable for discharge by MD. 08/05/15: Pt was admitted with symptoms of depression, rating 10/10.  08/10/15:  Rates depression a 5 today 07/1915:  Same rating today 08/18/15:  Continues to isolate in room in bed, poor appetite, not taking care of ADL's 08/23/14:  Rates depression a 3 today  3.  Goal(s): Patient will demonstrate decreased signs and symptoms of anxiety.  Met:  Yes  Target date: 3-5 days from date of admission   As evidenced by: Patient will utilize self rating of anxiety at 3 or below and demonstrated decreased signs of anxiety, or be deemed stable for discharge by MD 08/05/15;  Rates anxiety a 7 today 08/10/15:  Rates anxiety a 2 today  5.  Goal(s): Patient will demonstrate decreased signs of psychosis  . Met:  Yes . Target date: 3-5 days from date of admission  . As evidenced by: Patient will demonstrate decreased frequency of AVH or return to baseline function   -08/05/15 Pt endorses auditory hallucinations.   08/10/15:  Symptoms persist   08/15/15:  Symptoms persist-still spending a lot of time in bed isolating, poor group   attendance, not showering.   08/18/15: Has been refusing meds.  Was IVC'd today, and second opinion to force meds   obtained.  08/23/15:  Responding to the Zyprexa.  Has been to a group and in general is more engaged. 08/26/15:  Pt willing to take meds now, and mother is willing to take her back home  Attendees:  Patient:    Family:    Physician: Ursula Alert  08/26/2015 10:32 AM  Nursing: Hedy Jacob  08/26/2015 10:32 AM   Clinical Social Worker [facility] 08/26/2015 10:32 AM  Other:  08/26/2015 10:32 AM  Clinical:   08/26/2015 10:32 AM  Other: , RN Charge Nurse 08/26/2015 10:32 AM  Other: Hilda Lias, P4CC

## 2015-08-26 NOTE — Progress Notes (Signed)
Patient ID: Jessica [name], female   DOB: 05/25/1975, 40 y.o.   MRN: [id number] D: Patient observed watching TV reports doing well. Pt interaction with peers and staff is minimal. Pt reports tolerating medication well. Denies  SI/HI/AVH and pain.No behavioral issues noted.  A: Support and encouragement offered as needed. Medications administered as prescribed.  R: Patient is safe on the unit. Pt attended and engaged in evenning wrap up group.

## 2015-08-26 NOTE — Progress Notes (Signed)
  [facility] :  Will you be returning to the same living situation after discharge:  Yes,  home At discharge, do you have transportation home?: Yes,  mother Do you have the ability to pay for your medications: Yes,  insurance  Release of information consent forms completed and in the chart;  Patient's signature needed at discharge.  Patient to Follow up at: Follow-up Information    Follow up with Dr Emerson MonteParrish McKinney On 08/31/2015.   Why:  Wednesday at 9:00 with Dr Lawernce IonBraden   Contact information:   [address] [place] [336] [phone]      Follow up with [facility] On 09/06/2015.   Why:  Tuesday at 10:30 for an 11:00 appointment with Fenton MallingMichelle Gallimore.  Since this is a new agency for her, you will need to call them this Monday to confirm your appointment, and give them a credit card #   Contact information:   [address] [place]  [phone]      Next level of care provider has access to [facility] Link:no  Safety Planning and Suicide Prevention discussed: Yes,  yes  Have you used any form of tobacco in the last 30 days? (Cigarettes, Smokeless Tobacco, Cigars, and/or Pipes): No  Has patient been referred to the Quitline?: N/A patient is not a smoker  Patient has been referred for addiction treatment: N/A  Ida Rogueorth, Zakee Deerman B 08/26/2015, 2:20 PM

## 2015-08-26 NOTE — Progress Notes (Signed)
[facility] Progress [facility]  08/26/2015 2:39 PM Drucilla ChaletJennifer L Gosse  MRN:  [id number]  Subjective:  Pt states" I am ok."       Objective: Drucilla ChaletJennifer L Portee seen, chart reviewed and case discussed with the staff RN.  Pt is currently on forced medication order , seems to be progressing. Pt today seen as depressed ,but her affect is more reactive , smiles at times. Pt per staff is seen on the unit more , is seen as participating in groups often. Pt is compliant on medications, denies ADRs.      Principal Problem: Schizoaffective disorder, bipolar type (HCC) Diagnosis:   Patient Active Problem List   Diagnosis Date Noted  . UTI (lower urinary tract infection) [N39.0]   . Schizoaffective disorder, bipolar type (HCC) [F25.0] 07/25/2015   Total Time spent with patient: 25 minutes  Past Medical History: Please see H&P.  Past Medical History  Diagnosis Date  . Bipolar 1 disorder (HCC)    History reviewed. No pertinent past surgical history. Family History:  Family History  Problem Relation Age of Onset  . Hypertension Mother   . Mental illness Father    Social History: Please see H&P.  History  Alcohol Use No     History  Drug Use No    Social History   Social History  . Marital Status: Single    Spouse Name: N/A  . Number of Children: N/A  . Years of Education: N/A   Social History Main Topics  . Smoking status: Never Smoker   . Smokeless tobacco: None  . Alcohol Use: No  . Drug Use: No  . Sexual Activity: No   Other Topics Concern  . None   Social History Narrative   Additional Social History:   Sleep: Fair  Appetite:  Patient has a poor appetite - improving - seen as going to cafeteria   Current Medications: Current Facility-Administered Medications  Medication Dose Route Frequency Provider Last Rate Last Dose  . acetaminophen (TYLENOL) tablet 650 mg  650 mg Oral Q6H PRN Sanjuana KavaAgnes I Nwoko, NP   650 mg at 08/09/15 0108  . alum & mag hydroxide-simeth  (MAALOX/MYLANTA) 200-200-20 MG/5ML suspension 30 mL  30 mL Oral Q4H PRN Sanjuana KavaAgnes I Nwoko, NP      . doxepin (SINEQUAN) capsule 20 mg  20 mg Oral QHS PRN [name] LongsSaramma Tyshauna Finkbiner, MD      . feeding supplement (ENSURE ENLIVE) (ENSURE ENLIVE) liquid 237 mL  237 mL Oral Daily PRN [name] LongsSaramma Mel Langan, MD   237 mL at 08/23/15 0647  . hydrOXYzine (ATARAX/VISTARIL) tablet 25 mg  25 mg Oral Q6H PRN [name] LongsSaramma Judy Pollman, MD      . LORazepam (ATIVAN) tablet 0.5 mg  0.5 mg Oral QPM Wade Sigala, MD   0.5 mg at 08/25/15 1723   Or  . LORazepam (ATIVAN) injection 0.5 mg  0.5 mg Intramuscular QPM Ludella Pranger, MD      . magnesium hydroxide (MILK OF MAGNESIA) suspension 30 mL  30 mL Oral Daily PRN Sanjuana KavaAgnes I Nwoko, NP   30 mL at 08/12/15 1716  . OLANZapine (ZYPREXA) tablet 12.5 mg  12.5 mg Oral QHS [name] LongsSaramma Joshiah Traynham, MD   12.5 mg at 08/25/15 2152   Or  . OLANZapine (ZYPREXA) injection 12.5 mg  12.5 mg Intramuscular QHS Alexcia Schools, MD      . OXcarbazepine (TRILEPTAL) tablet 150 mg  150 mg Oral BID [name] LongsSaramma Rosalee Tolley, MD   150 mg at 08/26/15 0749  . ziprasidone (GEODON) capsule  20 mg  20 mg Oral BID PRN [name] LongsSaramma Nariya Neumeyer, MD       Or  . ziprasidone (GEODON) injection 10 mg  10 mg Intramuscular BID PRN [name] LongsSaramma Panfilo Ketchum, MD       Lab Results:  No results found for this or any previous visit (from the past 48 hour(s)).  Blood Alcohol level:  Lab Results  Component Value Date   ETH <5 08/03/2015   ETH <5 07/25/2015   Physical Findings: AIMS: Facial and Oral Movements Muscles of Facial Expression: None, normal Lips and Perioral Area: None, normal Jaw: None, normal Tongue: None, normal,Extremity Movements Upper (arms, wrists, hands, fingers): None, normal Lower (legs, knees, ankles, toes): None, normal, Trunk Movements Neck, shoulders, hips: None, normal, Overall Severity Severity of abnormal movements (highest score from questions above): None, normal Incapacitation due to abnormal movements: None, normal Patient's awareness of  abnormal movements (rate only patient's report): No Awareness, Dental Status Current problems with teeth and/or dentures?: No Does patient usually wear dentures?: No  CIWA:  CIWA-Ar Total: 0 COWS:  COWS Total Score: 0  Musculoskeletal: Strength & Muscle Tone: within normal limits Gait & Station: normal Patient leans: N/A  Psychiatric Specialty Exam: Physical Exam  Nursing note and vitals reviewed. Constitutional: She is oriented to person, place, and time. She appears well-developed.  Neck: Normal range of motion.  Musculoskeletal: Normal range of motion.  Neurological: She is alert and oriented to person, place, and time.  Skin: Skin is dry.  Psychiatric: She has a normal mood and affect. Her behavior is normal.    Review of Systems  Psychiatric/Behavioral: Positive for depression. Negative for suicidal ideas. The patient is nervous/anxious.   All other systems reviewed and are negative.    Blood pressure 125/83, pulse 100, temperature 97.5 F (36.4 C), temperature source Oral, resp. rate 16, height 5' 4.02" (1.626 m), weight 90.447 kg (199 lb 6.4 oz), last menstrual period 08/05/2015, SpO2 98 %.Body mass index is 34.21 kg/(m^2).  General Appearance: Guarded  Eye Contact:  Fair  Speech:  Slow  Volume:  Normal  Mood:  Anxious and Depressed  Affect:  Depressed dysphoric , improving  Thought Process:  Linear and Descriptions of Associations: Intact  Orientation:  Full (Time, Place, and Person)  Thought Content: denies AH   Suicidal Thoughts:  No denies any suicidal ideations, denies any self injurious ideations ; able to contract for safety- however is depressed   Homicidal Thoughts:  No  Memory:  recent and remote grossly intact immediate - fair  Judgement:  Fair   Insight: poor  Psychomotor Activity:  Normal  Concentration:  Concentration: Fair and Attention Span: Fair  Recall:  Good  Fund of Knowledge:  Good  Language:  Good  Akathisia:  Negative  Handed:  Right  AIMS  (if indicated):   no akathisia, no abnormal involuntary movements   Assets:  Desire for Improvement Resilience  ADL's:  Intact  Cognition:  WNL  Sleep:  Number of Hours: 6.75      Treatment Plan Summary: [name] Has been diagnosed with this schizoaffective disorder and most recent episode is increased symptoms of depressed tearful, withdrawn to her room mostly and has trouble taking care of ADLs as well as take her meals or medications. Dr.Saranga -  has seen patient for a second opinion for forced medications. Patient continues to be anxious and depressed , but is improving -will continue treatment.     Daily contact with patient to assess and evaluate symptoms and  progress in treatment, Medication management, Plan inpatient admission  and medications as below     Discontinued AM  Zyprexa 2.5 mg PO/IM daily - since that may causing her to be slowed down and drowsy during the day. Will continue Zyprexa  12.5 mg PO /IM  qhs for psychosis. Patient is on Forced medication order. Will continue Ativan to 0.5 mg PO/IM to daily for anxiety sx.Will taper off slowly based on progress. Will continue Trileptal 150 mg po bid for mood sx. Continue Doxepin  20 mg po qhs, but change it to PRN  for insomnia. Will make available prn medications as per agitation protocol. Will continue to monitor vitals ,medication compliance and treatment side effects while patient is here.  Will monitor for medical issues as well as call consult as needed.  CSW will continue working on disposition. Possible discharge tomorrow - mother to visit patient tonight. Recreational therapy consult.see notes . Patient to participate in therapeutic milieu.   Chrishana Spargur, MD 08/26/2015, 2:39 PM

## 2015-08-26 NOTE — Progress Notes (Signed)
  [facility] :  Will you be returning to the same living situation after discharge:  Yes,  home At discharge, do you have transportation home?: Yes,  mother Do you have the ability to pay for your medications: Yes,  insurance  Release of information consent forms completed and in the chart;  Patient's signature needed at discharge.  Patient to Follow up at: Follow-up Information    Follow up with Dr Emerson MonteParrish McKinney On 08/31/2015.   Why:  Wednesday at 9:00 with Dr Lawernce IonBraden   Contact information:   [address] [place] [336] [phone]      [facility] On 09/06/2015.   Why:  Tuesday at 10:30 for an 11:00 appointment with Fenton MallingMichelle Gallimore.  Since this is a new agency for her, you will need to call them this Monday to confirm your appointment, and give them a credit card #   Contact information:   [address] [place]  [phone]      Next level of care provider has access to [facility] Link:no  Safety Planning and Suicide Prevention discussed: Yes,  yes  Have you used any form of tobacco in the last 30 days? (Cigarettes, Smokeless Tobacco, Cigars, and/or Pipes): No  Has patient been referred to the Quitline?: N/A patient is not a smoker  Patient has been referred for addiction treatment: N/A  Jessica [name], Jessica [name] B 08/26/2015, 10:33 AM

## 2015-08-27 MED ORDER — OLANZAPINE 2.5 MG PO TABS: 12.5000 mg | ORAL_TABLET | Freq: Every day | ORAL | Status: DC

## 2015-08-27 MED ORDER — DOXEPIN HCL 10 MG PO CAPS: 20.0000 mg | ORAL_CAPSULE | Freq: Every evening | ORAL | Status: DC | PRN

## 2015-08-27 MED ORDER — HYDROXYZINE HCL 25 MG PO TABS: 25.0000 mg | ORAL_TABLET | Freq: Four times a day (QID) | ORAL | Status: DC | PRN

## 2015-08-27 MED ORDER — OXCARBAZEPINE 150 MG PO TABS: 150.0000 mg | ORAL_TABLET | Freq: Two times a day (BID) | ORAL | Status: DC

## 2015-08-27 MED ORDER — ZIPRASIDONE HCL 20 MG PO CAPS: 20.0000 mg | ORAL_CAPSULE | Freq: Two times a day (BID) | ORAL | Status: DC | PRN

## 2015-08-27 NOTE — BHH Counselor (Signed)
CSW called and left message for pt's mother, Marikay AlarLinda Tyminski at [phone], as member of RN staff requested check in with mother re their visit last evening. Plan was for pt to return home with mother but due to RN concern will continue to try to reach mother for confirmation.  Calls placed at 1:35PM and 1:59 PM  Called nursing statement to speak w pt's RN to update that unable to reach pt's mother; RN with mother on unit.  Carney Bernatherine C Emy Angevine, LCSW

## 2015-08-27 NOTE — Progress Notes (Signed)
D.  Pt in bed throughout thus far, no complaints voiced when awoken for medication pass.  Pt did not get up for group this evening.  It was reported by day shift RN that Pt had difficult visit with her mother whom she lives with and discharge was canceled due to Pt feeling like she can not yet go safely home.  Pt had expressed that she felt overwhelmed.  A.  Support and encouragement offered, medication given as ordered.  R. Pt remains safe on the unit, will continue to monitor.

## 2015-08-27 NOTE — Discharge Summary (Signed)
Physician Discharge Summary Note  Patient:  Jessica [name] is an 40 y.o., female MRN:  [id number] DOB:  03/19/1975 Patient phone:  [phone] (home)  Patient address:   [address] [place] [address],  Total Time spent with patient: 30 minutes  Date of Admission:  08/05/2015 Date of Discharge: 08/28/2015  Reason for Admission: Per HPI-" She complained of depressed mood and auditory/visual hallucination. [facility]) where she was treated for Bipolar I. History collected from Pt and from Jessica [name] mother Marikay Alar(Linda Casillas -- [phone]). Pt reported as follows: That she has been experiencing auditory/visual hallucinations -- hearing voices, seeing people. Pt had very poor eye contact, stared at the floor during the entire assessment, rocked back and forth, was very slow in responding to questions, appeared to experience internal cues and thought-blocking, and appeared to author to be preoccupied and anxious. When asked if Pt heard voices commanding her to harm herself, she paused a long period of time and responded, "I'm not sure." Pt reported that she is med compliant (takes approx. 100 mg Wellbutrin in AM, and Latuda in evening). When asked about triggers for her mood, Pt said she is "stressed out," but could not point to any particular trigger. Pt said she would like to come inpatient. Out of Jessica [name] presence, author spoke with Jessica [name] mother. 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 She was polite and cooperative, had very poor eye contact, appeared hunched over, and spoke very slowly and after  long pauses. Pt reported mood as depressed and affect was preoccupied. Pt endorsed despondency, insomnia, fatigue, and self-isolation. Mother reported that Pt is not tending to self-care. Pt denied suicidal or homicidal ideation, and she also denied substance use. Pt stated that she is experiencing auditory and visual hallucinations -- voices of people, visions of people. She would not confirm if they were commanding her to self-injure or injure others. Pt denied self-injurious behavior such as cutting, burning, or punching. Thought processes were slow and consistent with thought-blocking.'  Principal Problem: Schizoaffective disorder, bipolar type [facility](HCC) Discharge Diagnoses: Patient Active Problem List   Diagnosis Date Noted  . UTI (lower urinary tract infection) [N39.0]   . Schizoaffective disorder, bipolar type (HCC) [F25.0] 07/25/2015    Past Psychiatric History: See Above  Past Medical History:  Past Medical History  Diagnosis Date  . Bipolar 1 disorder (HCC)    History reviewed. No pertinent past surgical history. Family History:  Family History  Problem Relation Age of Onset  . Hypertension Mother   . Mental illness Father    Family Psychiatric  History: See HPI Social History:  History  Alcohol Use No     History  Drug Use No    Social History   Social History  . Marital Status: Single    Spouse Name: N/A  . Number of Children: N/A  . Years of Education: N/A   Social History Main Topics  . Smoking status: Never Smoker   . Smokeless tobacco: None  . Alcohol Use: No  . Drug Use: No  . Sexual Activity: No   Other Topics Concern  . None  Social History Narrative    Hospital Course:  Jessica [name] was admitted for Schizoaffective disorder, bipolar type (HCC)  and crisis management.  Pt was treated discharged with the medications listed below under Medication List.  Medical problems were identified and treated as needed.  Home medications were  restarted as appropriate.  Improvement was monitored by observation and Jessica [name] daily report of symptom reduction.  Emotional and mental status was monitored by daily self-inventory reports completed by Jessica [name] and clinical staff.         Jessica [name] was evaluated by the treatment team for stability and plans for continued recovery upon discharge. Jessica [name] motivation was an integral factor for scheduling further treatment. Employment, transportation, bed availability, health status, family support, and any pending legal issues were also considered during hospital stay. Pt was offered further treatment options upon discharge including but not limited to Residential, Intensive Outpatient, and Outpatient treatment.  Jessica [name] will follow up with the services as listed below under Follow Up Information.     Upon completion of this admission the patient was both mentally and medically stable for discharge denying suicidal/homicidal ideation, auditory/visual/tactile hallucinations, delusional thoughts and paranoia.    Jessica [name] was started on FORCED MEDICATION ORDER since she was refusing her medications. Pt responded well to treatment with Sinequan , Zprexa 12.5mg , Ativan 0.5mg   without adverse effects. Pt demonstrated improvement without reported or observed adverse effects to the point of stability appropriate for outpatient management. Pertinent labs include: CMP, CBC, UDS for which outpatient follow-up is necessary for lab recheck as mentioned below. Reviewed CBC, CMP, BAL, and UDS; all unremarkable aside from noted exceptions.   Physical Findings: AIMS: Facial and Oral Movements Muscles of Facial Expression: None, normal Lips and Perioral Area: None, normal Jaw: None, normal Tongue: None, normal,Extremity Movements Upper (arms, wrists, hands, fingers): None, normal Lower (legs, knees, ankles, toes): None, normal, Trunk Movements Neck,  shoulders, hips: None, normal, Overall Severity Severity of abnormal movements (highest score from questions above): None, normal Incapacitation due to abnormal movements: None, normal PatientDickerson awareness of abnormal movements (rate only patientDickerson report): No Awareness, Dental Status Current problems with teeth and/or dentures?: No Does patient usually wear dentures?: No  CIWA:  CIWA-Ar Total: 0 COWS:  COWS Total Score: 0  Musculoskeletal: Strength & Muscle Tone: within normal limits Gait & Station: normal Patient leans: N/A  Psychiatric Specialty Exam:See SRA by MD Physical Exam  Nursing note and vitals reviewed. Constitutional: She is oriented to person, place, and time. She appears well-developed.  Cardiovascular: Normal rate.   Musculoskeletal: Normal range of motion.  Neurological: She is alert and oriented to person, place, and time.  Psychiatric: She has a normal mood and affect. Her behavior is normal.    Review of Systems  Psychiatric/Behavioral: Negative for suicidal ideas and hallucinations. Depression: stable. Nervous/anxious: stable.   All other systems reviewed and are negative.   Blood pressure 123/79, pulse 92, temperature 97.8 F (36.6 C), temperature source Oral, resp. rate 16, height 5' 4.02" (1.626 m), weight 90.447 kg (199 lb 6.4 oz), last menstrual period 08/05/2015, SpO2 98 %.Body mass index is 34.21 kg/(m^2).   Have you used any form of tobacco in the last 30 days? (Cigarettes, Smokeless Tobacco, Cigars, and/or Pipes): No  Has this patient used any form of tobacco in the last 30 days? (Cigarettes, Smokeless Tobacco, CigarsDACIA CAPERS)  No  Blood Alcohol level:  Lab Results  Component Value Date   [facility] <5 08/03/2015   ETH <5 07/25/2015    Metabolic Disorder Labs:  Lab Results  Component Value Date   HGBA1C 5.7* 07/27/2015   MPG 117 07/27/2015   Lab Results  Component Value Date   PROLACTIN 20.7 07/27/2015   Lab Results  Component Value Date    CHOL 161 07/27/2015   TRIG 79 07/27/2015   HDL 50 07/27/2015   CHOLHDL 3.2 07/27/2015   VLDL 16 07/27/2015   LDLCALC 95 07/27/2015    See Psychiatric Specialty Exam and Suicide Risk Assessment completed by Attending Physician prior to discharge.  Discharge destination:  Home  Is patient on multiple antipsychotic therapies at discharge:  No   Has Patient had three or more failed trials of antipsychotic monotherapy by history:  No  Recommended Plan for Multiple Antipsychotic Therapies: NA      Discharge Instructions    Activity as tolerated - No restrictions    Complete by:  As directed      Diet general    Complete by:  As directed      Discharge instructions    Complete by:  As directed   Take all medications as prescribed. Keep all follow-up appointments as scheduled.  Do not consume alcohol or use illegal drugs while on prescription medications. Report any adverse effects from your medications to your primary care provider promptly.  In the event of recurrent symptoms or worsening symptoms, call 911, a crisis hotline, or go to the nearest emergency department for evaluation.            Medication List    STOP taking these medications        cephALEXin 500 MG capsule  Commonly known as:  KEFLEX     cholecalciferol 1000 units tablet  Commonly known as:  VITAMIN D     lurasidone 20 MG Tabs tablet  Commonly known as:  LATUDA     traZODone 50 MG tablet  Commonly known as:  DESYREL      TAKE these medications      Indication   doxepin 10 MG capsule  Commonly known as:  SINEQUAN  Take 2 capsules (20 mg total) by mouth at bedtime as needed (sleep).   Indication:  mood stablization     hydrOXYzine 25 MG tablet  Commonly known as:  ATARAX/VISTARIL  Take 1 tablet (25 mg total) by mouth every 6 (six) hours as needed for anxiety.   Indication:  Anxiety Neurosis     OLANZapine 2.5 MG tablet  Commonly known as:  ZYPREXA  Take 5 tablets (12.5 mg total) by mouth  at bedtime.   Indication:  m ood stablization     OXcarbazepine 150 MG tablet  Commonly known as:  TRILEPTAL  Take 1 tablet (150 mg total) by mouth 2 (two) times daily.   Indication:  mood stablization     ziprasidone 20 MG capsule  Commonly known as:  GEODON  Take 1 capsule (20 mg total) by mouth 2 (two) times daily as needed (SEVERE ANXIETY/AGITATION).   Indication:  mood stablization       Follow-up Information    Follow up with Dr Emerson MonteParrish McKinney On 08/31/2015.   Why:  Wednesday at 9:00 with Dr Lawernce IonBraden   Contact information:   [address] [place] [336] [phone]      [facility] On 09/06/2015.   Why:  Tuesday at 10:30 for an 11:00 appointment with Marcelino DusterMichelle  Gallimore.  Since this is a new agency for her, you will need to call them this Monday to confirm your appointment, and give them a credit card #   Contact information:   [address] [place]  [336] [phone]      Follow-up recommendations:  Activity:  as tolerated Diet:  heart healthy as recommned by Primary Care Provider  Comments:  Take all medications as prescribed. Keep all follow-up appointments as scheduled.  Do not consume alcohol or use illegal drugs while on prescription medications. Report any adverse effects from your medications to your primary care provider promptly.  In the event of recurrent symptoms or worsening symptoms, call 911, a crisis hotline, or go to the nearest emergency department for evaluation.   Signed: Hillery Jacks NP  08/28/15 8;30 AM    Patient was initially scheduled to be discharged on 08/27/15. However , when her mother came to pick her up - she refused to come out of her room and DC was cancelled. Next day when writer evaluated pt -  Pt reported that when RN went through her list of medications she realized she was on a lot and did not want to go home on all of them. RN on duty printed out two separate list of scheduled as well as PRN  medications and went through her medication list with her. Writer contacted mother - with patient present during the telephone call- discussed her disposition plan. Pt was discharged on 08/28/15. Patient was seen face to face for psychiatric evaluation, suicide risk assessment and case discussed with treatment team and NP and made appropriate disposition plans. Reviewed the information documented and agree with the treatment plan.    [name] Longs ,MD [facility]         Lonisha Bobby, MD 08/28/2015, 3:08 PM

## 2015-08-27 NOTE — [facility] Suicide Risk Assessment (Addendum)
[facility]   Principal Problem: Schizoaffective disorder, bipolar type [facility](HCC) Discharge Diagnoses:  Patient Active Problem List   Diagnosis Date Noted  . UTI (lower urinary tract infection) [N39.0]   . Schizoaffective disorder, bipolar type (HCC) [F25.0] 07/25/2015    Total Time spent with patient: 30 minutes  Musculoskeletal: Strength & Muscle Tone: within normal limits Gait & Station: normal Patient leans: N/A  Psychiatric Specialty Exam: Review of Systems  Psychiatric/Behavioral: Negative for depression.  All other systems reviewed and are negative.   Blood pressure 123/79, pulse 92, temperature 97.8 F (36.6 C), temperature source Oral, resp. rate 16, height 5' 4.02" (1.626 m), weight 90.447 kg (199 lb 6.4 oz), last menstrual period 08/05/2015, SpO2 98 %.Body mass index is 34.21 kg/(m^2).  General Appearance: Fairly Groomed  Patent attorneyye Contact::  Fair  Speech:  Normal [id number]  Volume:  Normal  Mood:  Euthymic  Affect:  Congruent  Thought Process:  Goal Directed and Descriptions of Associations: Intact  Orientation:  Full (Time, Place, and Person)  Thought Content:  Logical  Suicidal Thoughts:  No  Homicidal Thoughts:  No  Memory:  Immediate;   Fair Recent;   Fair Remote;   Fair  Judgement:  Fair  Insight:  Fair  Psychomotor Activity:  Normal  Concentration:  Fair  Recall:  FiservFair  Fund of Knowledge:Fair  Language: Fair  Akathisia:  No  Handed:  Right  AIMS (if indicated):     Assets:  Communication Skills Desire for Improvement  Sleep:  Number of Hours: 6.5  Cognition: WNL  ADL's:  Intact   Mental Status Per Nursing Assessment::   On Admission:     Demographic Factors:  Caucasian  Loss Factors: NA  Historical Factors: Impulsivity  Risk Reduction Factors:   Positive social support  Continued Clinical Symptoms:  Previous Psychiatric Diagnoses and Treatments  Cognitive Features That Contribute To Risk:  None    Suicide  Risk:  Minimal: No identifiable suicidal ideation.  Patients presenting with no risk factors but with morbid ruminations; may be classified as minimal risk based on the severity of the depressive symptoms  Follow-up Information    Follow up with Dr Emerson MonteParrish McKinney On 08/31/2015.   Why:  Wednesday at 9:00 with Dr Lawernce IonBraden   Contact information:   [address] [place] [phone]      [facility] On 09/06/2015.   Why:  Tuesday at 10:30 for an 11:00 appointment with Fenton MallingMichelle Gallimore.  Since this is a new agency for her, you will need to call them this Monday to confirm your appointment, and give them a credit card #   Contact information:   [address] [place]  [phone]      Plan Of Care/Follow-up recommendations:  Activity:  no restrictions Diet:  regular Tests:  as needed Other:  follow up with aftercare  Malachy Coleman, MD 08/28/2015, 9:16 AM

## 2015-08-27 NOTE — [facility] Group Notes (Signed)
[facility] Group Notes:  (Nursing/MHT/Case Management/Adjunct)  Date:  08/27/2015  Time:  11:13 AM  Type of Therapy:  Nurse Education  Participation Level:  Active  Participation Quality:  Appropriate and Attentive  Affect:  Appropriate  Cognitive:  Alert and Appropriate  Insight:  Appropriate and Good  Engagement in Group:  Engaged  Modes of Intervention:  Discussion and Education  Summary of Progress/Problems: Topic was on healthy coping skills. Patient encouraged to learn new coping skills that leads to a healthy lifestyle.  Patient was attentive and receptive.      Mickie Baillizabeth O Iwenekha 08/27/2015, 11:13 AM

## 2015-08-27 NOTE — Progress Notes (Addendum)
[name] is up first thing this morning and says " I'm ready to go home " . She completes her daily assessment and on it she wrote she deneid SI today and she rated her depression, hopelessness and anxiety " 2/0/3", respectively R DC palnning cont

## 2015-08-27 NOTE — Progress Notes (Addendum)
D RN approached pt to do DC teaching . Patient will  not make eye contact with this Clinical research associatewriter. When writer began to review pt's discharge meds, pt's lower lip began to tremble and she began to shake her head from left to right and then back to the left " no"..the patient stating  " It's just too much..its too much.. too much medicine". RN attempted to discuss medication regimen with pt and  She says" I don't know..I don't know if I can take care of myself. " A This writer spoke with NP and order obtained to cancel discharge order. Family aware. R Safety in place.

## 2015-08-27 NOTE — [facility] Group Notes (Signed)
[facility] LCSW Group Therapy  08/27/2015  11:20 - 11:55 AM  Type of Therapy:  Group Therapy  Participation Level:  Active  Participation Quality:  Attentive and Sharing when called upon  Affect:  Flat  Cognitive:  Alert and Oriented  Insight:  Developing  Engagement in Therapy:  Engaged  Modes of Intervention:  Discussion, Exploration, Rapport Building, Socialization and Support  Summary of Progress/Problems:   Summary of Progress/Problems: The main focus of today's process group was for the patient to identify ways in which they have in the past sabotaged their own recovery. Motivational Interviewing was then utilized to ask the group  how they can make changes toward self care. Patient would make eye contact when answering questions. Patient shared how using her toolbox of ideas for coping is frequently helpful although "sometimes (she) neglects to pick up the toolbox."  Harrill, Julious Payeratherine Campbell

## 2015-08-28 NOTE — Progress Notes (Addendum)
[facility]  08/27/2015 5:00 pm Jessica [name]  MRN:  [id number]  Subjective:  Pt states" I am ok."       Objective: Jessica [name] seen, chart reviewed and case discussed with the staff RN.  Pt is currently on forced medication order , seems to be progressing. Pt today was seen , denied any new concerns to Clinical research associatewriter . Discussed discharge with patient and she agreed , was pleasant and appropriate with Clinical research associatewriter. Pt is compliant on medications, denies ADRs.      Principal Problem: Schizoaffective disorder, bipolar type (HCC) Diagnosis:   Patient Active Problem List   Diagnosis Date Noted  . UTI (lower urinary tract infection) [N39.0]   . Schizoaffective disorder, bipolar type (HCC) [F25.0] 07/25/2015   Total Time spent with patient: 25 minutes  Past Medical History: Please see H&P.  Past Medical History  Diagnosis Date  . Bipolar 1 disorder (HCC)    History reviewed. No pertinent past surgical history. Family History:  Family History  Problem Relation Age of Onset  . Hypertension Mother   . Mental illness Father    Social History: Please see H&P.  History  Alcohol Use No     History  Drug Use No    Social History   Social History  . Marital Status: Single    Spouse Name: N/A  . Number of Children: N/A  . Years of Education: N/A   Social History Main Topics  . Smoking status: Never Smoker   . Smokeless tobacco: None  . Alcohol Use: No  . Drug Use: No  . Sexual Activity: No   Other Topics Concern  . None   Social History Narrative   Additional Social History:   Sleep: Fair  Appetite:  Patient has a poor appetite - improving - seen as going to cafeteria   Current Medications: Current Facility-Administered Medications  Medication Dose Route Frequency Provider Last Rate Last Dose  . acetaminophen (TYLENOL) tablet 650 mg  650 mg Oral Q6H PRN Sanjuana KavaAgnes I Nwoko, NP   650 mg at 08/09/15 0108  . alum & mag hydroxide-simeth (MAALOX/MYLANTA)  200-200-20 MG/5ML suspension 30 mL  30 mL Oral Q4H PRN Sanjuana KavaAgnes I Nwoko, NP      . doxepin (SINEQUAN) capsule 20 mg  20 mg Oral QHS PRN [name] LongsSaramma Kenyotta Dorfman, MD      . feeding supplement (ENSURE ENLIVE) (ENSURE ENLIVE) liquid 237 mL  237 mL Oral Daily PRN [name] LongsSaramma Elverta Dimiceli, MD   237 mL at 08/23/15 0647  . hydrOXYzine (ATARAX/VISTARIL) tablet 25 mg  25 mg Oral Q6H PRN [name] LongsSaramma Ashtyn Meland, MD      . LORazepam (ATIVAN) tablet 0.5 mg  0.5 mg Oral QPM Saoirse Legere, MD   0.5 mg at 08/27/15 1713   Or  . LORazepam (ATIVAN) injection 0.5 mg  0.5 mg Intramuscular QPM Somaya Grassi, MD      . magnesium hydroxide (MILK OF MAGNESIA) suspension 30 mL  30 mL Oral Daily PRN Sanjuana KavaAgnes I Nwoko, NP   30 mL at 08/27/15 1031  . OLANZapine (ZYPREXA) tablet 12.5 mg  12.5 mg Oral QHS [name] LongsSaramma Tedrick Port, MD   12.5 mg at 08/27/15 2102   Or  . OLANZapine (ZYPREXA) injection 12.5 mg  12.5 mg Intramuscular QHS Chaitra Mast, MD      . OXcarbazepine (TRILEPTAL) tablet 150 mg  150 mg Oral BID [name] LongsSaramma Christiano Blandon, MD   150 mg at 08/28/15 0804  . ziprasidone (GEODON) capsule 20 mg  20 mg Oral  BID PRN [name] LongsSaramma Swan Zayed, MD       Or  . ziprasidone (GEODON) injection 10 mg  10 mg Intramuscular BID PRN [name] LongsSaramma [place], MD       Lab Results:  No results found for this or any previous visit (from the past 48 hour(s)).  Blood Alcohol level:  Lab Results  Component Value Date   ETH <5 08/03/2015   ETH <5 07/25/2015   Physical Findings: AIMS: Facial and Oral Movements Muscles of Facial Expression: None, normal Lips and Perioral Area: None, normal Jaw: None, normal Tongue: None, normal,Extremity Movements Upper (arms, wrists, hands, fingers): None, normal Lower (legs, knees, ankles, toes): None, normal, Trunk Movements Neck, shoulders, hips: None, normal, Overall Severity Severity of abnormal movements (highest score from questions above): None, normal Incapacitation due to abnormal movements: None, normal Patient's awareness of abnormal movements  (rate only patient's report): No Awareness, Dental Status Current problems with teeth and/or dentures?: No Does patient usually wear dentures?: No  CIWA:  CIWA-Ar Total: 0 COWS:  COWS Total Score: 0  Musculoskeletal: Strength & Muscle Tone: within normal limits Gait & Station: normal Patient leans: N/A  Psychiatric Specialty Exam: Physical Exam  Nursing note and vitals reviewed. Constitutional: She is oriented to person, place, and time. She appears well-developed.  Neck: Normal range of motion.  Musculoskeletal: Normal range of motion.  Neurological: She is alert and oriented to person, place, and time.  Skin: Skin is dry.  Psychiatric: She has a normal mood and affect. Her behavior is normal.    Review of Systems  Psychiatric/Behavioral: Positive for depression. Negative for suicidal ideas. The patient is nervous/anxious.   All other systems reviewed and are negative.    Blood pressure 123/79, pulse 92, temperature 97.8 F (36.6 C), temperature source Oral, resp. rate 16, height 5' 4.02" (1.626 m), weight 90.447 kg (199 lb 6.4 oz), last menstrual period 08/05/2015, SpO2 98 %.Body mass index is 34.21 kg/(m^2).  General Appearance: Guarded  Eye Contact:  Fair  Speech:  Slow  Volume:  Normal  Mood:  Anxious improved  Affect:  Depressed improving  Thought Process:  Linear and Descriptions of Associations: Intact  Orientation:  Full (Time, Place, and Person)  Thought Content: denies AH   Suicidal Thoughts:  No denies any suicidal ideations, denies any self injurious ideations ; able to contract for safety- however is depressed   Homicidal Thoughts:  No  Memory:  recent and remote grossly intact immediate - fair  Judgement:  Fair   Insight: poor  Psychomotor Activity:  Normal  Concentration:  Concentration: Fair and Attention Span: Fair  Recall:  Good  Fund of Knowledge:  Good  Language:  Good  Akathisia:  Negative  Handed:  Right  AIMS (if indicated):   no akathisia, no  abnormal involuntary movements   Assets:  Desire for Improvement Resilience  ADL's:  Intact  Cognition:  WNL  Sleep:  Number of Hours: 6.5      Treatment Plan Summary: Jessica [name] Has been diagnosed with this schizoaffective disorder , was seen today and discharge scheduled. However later on in the afternoon, pt got upset about her list of medications and refused to come out of her room. Pt upset about being on a lot of medications. Pt hence per RN could not be discharged since she would not leave her room.     Daily contact with patient to assess and evaluate symptoms and progress in treatment, Medication management, Plan inpatient admission  and medications as below  No changes made in plan - see below.   Will continue Zyprexa  12.5 mg PO /IM  qhs for psychosis. Patient was started on Forced medication order earlier , however currently has been taking them PO. Will continue Ativan to 0.5 mg PO/IM to daily for anxiety sx.Will taper off slowly based on progress. Will continue Trileptal 150 mg po bid for mood sx. Continue Doxepin  20 mg po qhs, but change it to PRN  for insomnia. Will make available prn medications as per agitation protocol. Will continue to monitor vitals ,medication compliance and treatment side effects while patient is here.  Will monitor for medical issues as well as call consult as needed.  CSW will continue working on disposition.  Recreational therapy consult.see notes . Patient to participate in therapeutic milieu.   Caleb Prigmore, MD 08/27/2015, 5 .00PM

## 2015-08-28 NOTE — [facility] Counselor (Signed)
Contacted pt's mother, Marikay AlarLinda Devoto at [phone] at 8:50am, as per physician's request relayed through Unit RN. Mother reports major concern as patient's discharge was cancelled yesterday. Mother insists that she be called by RN on unit or attending. Request relayed to patient's RN.   Carney Bernatherine C Chancelor Hardrick, LCSW

## 2015-08-28 NOTE — [facility] Group Notes (Signed)
[facility] LCSW Group Therapy  08/28/2015 11:15 AM  Type of Therapy:  Group Therapy  Participation Level:  Active  Participation Quality:  Appropriate  Affect:  Flat  Cognitive:  Alert and Oriented  Insight:  Engaged  Engagement in Therapy:  Improving  Modes of Intervention:  Clarification, Exploration, Rapport Building, Socialization and Support  Summary of Progress/Problems: The main focus of today's process group was to identify the patient's current support system and decide on other supports that can be put in place. An emphasis was placed on using counselor, doctor, therapy groups, 12-step groups, and problem-specific support groups to expand supports. There was also an extensive discussion about what constitutes a healthy support versus an unhealthy support. The patient expressed comprehension of the concepts presented, and agreed that there is a need to add more supports; [facility] and shared with group about [facility] presentation. The patient stated the biggest challenge she faces at discharge is anxiety about sleep schedule as she has bot difficulty getting to sleep and staying asleep.  Patient was given breathing exercise to help regulate sleep and handout for [facility].   Carney Bernatherine C Harrill, LCSW

## 2015-08-28 NOTE — Progress Notes (Signed)
Writer called mother Marikay AlarLinda Passmore at # [phone] - patient was present during telephone call. Discussed with mother why pt had refused discharge yesterday and provided medication education. RN printed out two separate copies of scheduled and PRN medications and went through her medication list with patient. Pt and mother voiced understanding. Pt 's mother to pick up patient today.  [name] LongsSaramma Newell Wafer ,MD Attending Psychiatrist  [facility]

## 2015-08-28 NOTE — Progress Notes (Signed)
Patient ID: Jessica ChaletJennifer L [name], female   DOB: 10/26/1975, 40 y.o.   MRN: [id number]   Pt was discharged home with her mother, pt reported that she was a little unsure about her medications. This Clinical research associatewriter went over all medications with patient, this Clinical research associatewriter explained all scheduled medications verus as needed medications. The patient verbalized that she understood all of her medications as well as her follow up mediations. Pt was given her discharge instructions and prescriptions. Pt with mother present reported that she was ready for discharge. Pt was discharged home.

## 2015-08-31 DIAGNOSIS — F3181 Bipolar II disorder: Secondary | ICD-10-CM | POA: Diagnosis not present

## 2015-09-15 DIAGNOSIS — F3130 Bipolar disorder, current episode depressed, mild or moderate severity, unspecified: Secondary | ICD-10-CM | POA: Diagnosis not present

## 2015-09-21 DIAGNOSIS — F3130 Bipolar disorder, current episode depressed, mild or moderate severity, unspecified: Secondary | ICD-10-CM | POA: Diagnosis not present

## 2015-10-03 DIAGNOSIS — F3130 Bipolar disorder, current episode depressed, mild or moderate severity, unspecified: Secondary | ICD-10-CM | POA: Diagnosis not present

## 2015-10-05 DIAGNOSIS — F3130 Bipolar disorder, current episode depressed, mild or moderate severity, unspecified: Secondary | ICD-10-CM | POA: Diagnosis not present

## 2015-11-02 DIAGNOSIS — F3130 Bipolar disorder, current episode depressed, mild or moderate severity, unspecified: Secondary | ICD-10-CM | POA: Diagnosis not present

## 2015-11-16 DIAGNOSIS — F3130 Bipolar disorder, current episode depressed, mild or moderate severity, unspecified: Secondary | ICD-10-CM | POA: Diagnosis not present

## 2015-12-14 DIAGNOSIS — F3130 Bipolar disorder, current episode depressed, mild or moderate severity, unspecified: Secondary | ICD-10-CM | POA: Diagnosis not present

## 2015-12-27 DIAGNOSIS — Z23 Encounter for immunization: Secondary | ICD-10-CM | POA: Diagnosis not present

## 2015-12-28 DIAGNOSIS — F3130 Bipolar disorder, current episode depressed, mild or moderate severity, unspecified: Secondary | ICD-10-CM | POA: Diagnosis not present

## 2016-01-18 DIAGNOSIS — F3130 Bipolar disorder, current episode depressed, mild or moderate severity, unspecified: Secondary | ICD-10-CM | POA: Diagnosis not present

## 2016-02-08 DIAGNOSIS — F3130 Bipolar disorder, current episode depressed, mild or moderate severity, unspecified: Secondary | ICD-10-CM | POA: Diagnosis not present

## 2016-02-29 ENCOUNTER — Emergency Department (HOSPITAL_COMMUNITY)
Admission: EM | Admit: 2016-02-29 | Discharge: 2016-02-29 | Disposition: A | Discharge status: Other Acute Inpatient Hospital | Payer: BLUE CROSS/BLUE SHIELD | Attending: Emergency Medicine | Admitting: Emergency Medicine

## 2016-02-29 ENCOUNTER — Encounter (HOSPITAL_COMMUNITY): Payer: Self-pay | Admitting: *Deleted

## 2016-02-29 DIAGNOSIS — R443 Hallucinations, unspecified: Secondary | ICD-10-CM | POA: Diagnosis not present

## 2016-02-29 DIAGNOSIS — R440 Auditory hallucinations: Secondary | ICD-10-CM | POA: Insufficient documentation

## 2016-02-29 DIAGNOSIS — Z5181 Encounter for therapeutic drug level monitoring: Secondary | ICD-10-CM | POA: Diagnosis not present

## 2016-02-29 DIAGNOSIS — E876 Hypokalemia: Secondary | ICD-10-CM | POA: Diagnosis not present

## 2016-02-29 LAB — URINALYSIS, ROUTINE W REFLEX MICROSCOPIC
BILIRUBIN URINE: NEGATIVE
GLUCOSE, UA: NEGATIVE mg/dL
Ketones, ur: 20 mg/dL — AB
LEUKOCYTES UA: NEGATIVE
Nitrite: NEGATIVE
PH: 5 (ref 5.0–8.0)
Protein, ur: NEGATIVE mg/dL
SPECIFIC GRAVITY, URINE: 1.02 (ref 1.005–1.030)

## 2016-02-29 LAB — ETHANOL

## 2016-02-29 LAB — COMPREHENSIVE METABOLIC PANEL
ALBUMIN: 4.8 g/dL (ref 3.5–5.0)
ALK PHOS: 65 U/L (ref 38–126)
ALT: 20 U/L (ref 14–54)
ANION GAP: 17 — AB (ref 5–15)
AST: 29 U/L (ref 15–41)
BILIRUBIN TOTAL: 0.3 mg/dL (ref 0.3–1.2)
BUN: 15 mg/dL (ref 6–20)
CALCIUM: 8.6 mg/dL — AB (ref 8.9–10.3)
CO2: 18 mmol/L — ABNORMAL LOW (ref 22–32)
Chloride: 103 mmol/L (ref 101–111)
Creatinine, Ser: 0.77 mg/dL (ref 0.44–1.00)
GFR calc non Af Amer: >60 mL/min (ref >60)
Glucose, Bld: 153 mg/dL — ABNORMAL HIGH (ref 65–99)
POTASSIUM: 3.2 mmol/L — AB (ref 3.5–5.1)
Sodium: 138 mmol/L (ref 135–145)
TOTAL PROTEIN: 7.5 g/dL (ref 6.5–8.1)

## 2016-02-29 LAB — SALICYLATE LEVEL

## 2016-02-29 LAB — CBC WITH DIFFERENTIAL/PLATELET
BASOS ABS: 0 10*3/uL (ref 0.0–0.1)
BASOS PCT: 0 %
EOS PCT: 1 %
Eosinophils Absolute: 0.1 10*3/uL (ref 0.0–0.7)
HCT: 40.6 % (ref 36.0–46.0)
Hemoglobin: 13.4 g/dL (ref 12.0–15.0)
LYMPHS PCT: 29 %
Lymphs Abs: 2.2 10*3/uL (ref 0.7–4.0)
MCH: 29.5 pg (ref 26.0–34.0)
MCHC: 33 g/dL (ref 30.0–36.0)
MCV: 89.4 fL (ref 78.0–100.0)
MONO ABS: 0.5 10*3/uL (ref 0.1–1.0)
Monocytes Relative: 7 %
Neutro Abs: 4.7 10*3/uL (ref 1.7–7.7)
Neutrophils Relative %: 63 %
PLATELETS: 307 10*3/uL (ref 150–400)
RBC: 4.54 MIL/uL (ref 3.87–5.11)
RDW: 13.4 % (ref 11.5–15.5)
WBC: 7.5 10*3/uL (ref 4.0–10.5)

## 2016-02-29 LAB — RAPID URINE DRUG SCREEN, HOSP PERFORMED
Amphetamines: NOT DETECTED
BENZODIAZEPINES: POSITIVE — AB
Barbiturates: NOT DETECTED
COCAINE: NOT DETECTED
Opiates: NOT DETECTED
TETRAHYDROCANNABINOL: NOT DETECTED

## 2016-02-29 LAB — PREGNANCY, URINE: PREG TEST UR: NEGATIVE

## 2016-02-29 LAB — ACETAMINOPHEN LEVEL

## 2016-02-29 MED ORDER — LORAZEPAM 2 MG/ML IJ SOLN
2.0000 mg | Freq: Once | INTRAMUSCULAR | Status: AC
  Administered 2016-02-29: 2 mg via INTRAMUSCULAR

## 2016-02-29 MED ORDER — DIPHENHYDRAMINE HCL 50 MG/ML IJ SOLN
50.0000 mg | Freq: Once | INTRAMUSCULAR | Status: AC
  Administered 2016-02-29: 50 mg via INTRAMUSCULAR

## 2016-02-29 MED ORDER — STERILE WATER FOR INJECTION IJ SOLN
INTRAMUSCULAR | Status: AC
  Administered 2016-02-29: 1.2 mL
  Filled 2016-02-29: qty 10

## 2016-02-29 MED ORDER — DIPHENHYDRAMINE HCL 50 MG/ML IJ SOLN
INTRAMUSCULAR | Status: AC
  Administered 2016-02-29: 50 mg via INTRAMUSCULAR
  Filled 2016-02-29: qty 1

## 2016-02-29 MED ORDER — ZIPRASIDONE MESYLATE 20 MG IM SOLR
20.0000 mg | Freq: Once | INTRAMUSCULAR | Status: AC
  Administered 2016-02-29: 20 mg via INTRAMUSCULAR

## 2016-02-29 MED ORDER — ZIPRASIDONE MESYLATE 20 MG IM SOLR
INTRAMUSCULAR | Status: AC
  Administered 2016-02-29: 20 mg via INTRAMUSCULAR
  Filled 2016-02-29: qty 20

## 2016-02-29 MED ORDER — LORAZEPAM 2 MG/ML IJ SOLN
INTRAMUSCULAR | Status: AC
  Administered 2016-02-29: 2 mg via INTRAMUSCULAR
  Filled 2016-02-29: qty 1

## 2016-02-29 MED ORDER — POTASSIUM CHLORIDE CRYS ER 20 MEQ PO TBCR
40.0000 meq | EXTENDED_RELEASE_TABLET | Freq: Every day | ORAL | Status: DC
  Administered 2016-02-29: 40 meq via ORAL
  Filled 2016-02-29: qty 2

## 2016-02-29 NOTE — BH Assessment (Signed)
[facility] Assessment Progress Note   Case was staffed with Lord DNP who recommended an inpatient admission as appropriate bed placement is investigated.     
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
## 2016-02-29 NOTE — Progress Notes (Signed)
[facility] by Dr. Ardyth HarpsHernandez.  Attending Physician will be Dr. Ardyth HarpsHernandez.   Patient [facility] [name].   Call to report is [phone] Shy Guallpa K. Sherlon HandingHarris, LCAS-A, LPC-A, [facility]  Counselor 02/29/2016 6:43 PM

## 2016-02-29 NOTE — ED Notes (Signed)
Got report from [place] [facility] social worker that [facility] been [facility]. Report to call after 9:30 pm. Patient notified. Patient receptive.

## 2016-02-29 NOTE — ED Notes (Signed)
Patient left the unit @ 23:45 in the company of [employer]. Patient ambulatory and stable. Left with her belongings.
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
## 2016-02-29 NOTE — ED Notes (Signed)
Report given to The PepsiChristine RN. [employer] called for transportation.

## 2016-02-29 NOTE — ED Notes (Signed)
Pt presently screaming at staff, increasing agitation, refusing to dress out in scrubs, tangential, EDP notified, security at bedside.

## 2016-02-29 NOTE — ED Notes (Signed)
[facility]

## 2016-03-01 ENCOUNTER — Inpatient Hospital Stay
Admit: 2016-03-01 | Discharge: 2016-04-20 | DRG: 885 | Disposition: A | Discharge status: Home / Self Care | Payer: BLUE CROSS/BLUE SHIELD | Source: Ambulatory Visit | Attending: Psychiatry | Admitting: Psychiatry

## 2016-03-01 DIAGNOSIS — Z9114 Patient's other noncompliance with medication regimen: Secondary | ICD-10-CM

## 2016-03-01 DIAGNOSIS — K5900 Constipation, unspecified: Secondary | ICD-10-CM | POA: Diagnosis not present

## 2016-03-01 DIAGNOSIS — F250 Schizoaffective disorder, bipolar type: Secondary | ICD-10-CM | POA: Diagnosis present

## 2016-03-01 DIAGNOSIS — R630 Anorexia: Secondary | ICD-10-CM | POA: Diagnosis present

## 2016-03-01 DIAGNOSIS — G4700 Insomnia, unspecified: Secondary | ICD-10-CM | POA: Diagnosis present

## 2016-03-01 DIAGNOSIS — K589 Irritable bowel syndrome without diarrhea: Secondary | ICD-10-CM

## 2016-03-01 DIAGNOSIS — F22 Delusional disorders: Secondary | ICD-10-CM | POA: Diagnosis not present

## 2016-03-01 DIAGNOSIS — Z79899 Other long term (current) drug therapy: Secondary | ICD-10-CM | POA: Diagnosis not present

## 2016-03-01 DIAGNOSIS — F29 Unspecified psychosis not due to a substance or known physiological condition: Secondary | ICD-10-CM | POA: Diagnosis not present

## 2016-03-01 DIAGNOSIS — F419 Anxiety disorder, unspecified: Secondary | ICD-10-CM | POA: Diagnosis present

## 2016-03-01 HISTORY — DX: Schizophrenia, unspecified: F20.9

## 2016-03-01 HISTORY — DX: Depression, unspecified: F32.A

## 2016-03-01 HISTORY — DX: Major depressive disorder, single episode, unspecified: F32.9

## 2016-03-01 HISTORY — DX: Anxiety disorder, unspecified: F41.9

## 2016-03-01 MED ORDER — DIVALPROEX SODIUM ER 500 MG PO TB24
2000.0000 mg | ORAL_TABLET | Freq: Every day | ORAL | Status: DC
  Filled 2016-03-01: qty 4

## 2016-03-01 MED ORDER — MAGNESIUM HYDROXIDE 400 MG/5ML PO SUSP
30.0000 mL | Freq: Every day | ORAL | Status: DC | PRN
  Administered 2016-03-21 – 2016-04-14 (×3): 30 mL via ORAL
  Filled 2016-03-01 (×3): qty 30

## 2016-03-01 MED ORDER — ACETAMINOPHEN 325 MG PO TABS
650.0000 mg | ORAL_TABLET | Freq: Four times a day (QID) | ORAL | Status: DC | PRN
  Administered 2016-04-20: 650 mg via ORAL
  Filled 2016-03-01: qty 2

## 2016-03-01 MED ORDER — ALUM & MAG HYDROXIDE-SIMETH 200-200-20 MG/5ML PO SUSP: 30.0000 mL | ORAL | Status: DC | PRN

## 2016-03-01 MED ORDER — ENSURE ENLIVE PO LIQD
237.0000 mL | Freq: Two times a day (BID) | ORAL | Status: DC
  Administered 2016-03-01 – 2016-03-05 (×6): 237 mL via ORAL

## 2016-03-01 MED ORDER — LORAZEPAM 2 MG PO TABS
2.0000 mg | ORAL_TABLET | ORAL | Status: DC | PRN
Start: 2016-03-01 — End: 2016-03-07
  Administered 2016-03-04 – 2016-03-05 (×2): 2 mg via ORAL
  Filled 2016-03-01 (×4): qty 1

## 2016-03-01 MED ORDER — OLANZAPINE 5 MG PO TBDP: 10.0000 mg | ORAL_TABLET | Freq: Two times a day (BID) | ORAL | Status: DC

## 2016-03-01 MED ORDER — OLANZAPINE 5 MG PO TBDP
10.0000 mg | ORAL_TABLET | Freq: Two times a day (BID) | ORAL | Status: DC
  Filled 2016-03-01 (×3): qty 2

## 2016-03-01 NOTE — Progress Notes (Signed)
D: Patient is a 41 y.o.  year-old female admitted to [facility] ambulatory without difficulty. Patient is alert and oriented upon admission. A: Admission assessment completed without difficulty. Skin and contraband assessment completed with BUKOLA RN no skin abnormalities nor contraband found. Q.15 minute safety checks were implemented at the time of admission. Patient was oriented to the unit and escorted to room 319                                                            R: Patient was receptive to and cooperative with admission assessment. Patient contracts for safety on the unit at this time

## 2016-03-01 NOTE — Progress Notes (Signed)
Dr. Ardyth HarpsHernandez requested assistance.  Patient in office refusing to leave.  States that "I was having a conversation with her and I would like to finish it."  Dr. Ardyth HarpsHernandez informed patient that she would talk to her later in her room.  Patient did not want that stating that her room is her private sanctuary and that she would stay right where she is. Explained to patient that the her and doctors conversation was done and asked if she would return to her room.  Patient then started speaking about the Shaune PollackLord and that he is her Coralee Northemple and other religious jargon.  Patient continued to refuse to return to her room.  Informed patient that we needed to walk to her room or otherwise we would have to carry her to her room.  Patient then grabbed hold to arm of chair and stated that she was not leaving the office. This Clinical research associatewriter and Shelia MediaJanet Jones proceeded to assist patient out of chair and patient tried to remain.  When got patient clear of chair she folded her knees under her.  Security came in and took over lifting patient under her arms and carried her to her room and placed her on her bed.

## 2016-03-01 NOTE — [facility] Group Notes (Signed)
[facility] LCSW Group Therapy   03/01/2016  9:30 AM  Type of Therapy: Group Therapy   Participation Level: Did Not Attend. Patient invited to participate but declined.     Payes F. Hasaan Radde, MSW, LCSWA, LCAS     

## 2016-03-01 NOTE — Tx Team (Cosign Needed)
Initial Treatment Plan 03/01/2016 3:36 AM Jessica ChaletJennifer L Krach [id number]    PATIENT STRESSORS: Financial difficulties Marital or family conflict Medication change or noncompliance   PATIENT STRENGTHS: Ability for insight Communication skills Motivation for treatment/growth   PATIENT IDENTIFIED PROBLEMS: "bipolar not managed well"  "mother daughter stressors"                   DISCHARGE CRITERIA:  Improved stabilization in mood, thinking, and/or behavior Medical problems require only outpatient monitoring Motivation to continue treatment in a less acute level of care Safe-care adequate arrangements made  PRELIMINARY DISCHARGE PLAN: Outpatient therapy Participate in family therapy Return to previous living arrangement Return to previous work or school arrangements  PATIENT/FAMILY INVOLVEMENT: This treatment plan has been presented to and reviewed with the patient, Jessica ChaletJennifer L Shannon, and/or family member  The patient and family have been given the opportunity to ask questions and make suggestions.  Hillery Jackshristine W Mikala Podoll, RN 03/01/2016, 3:36 AM

## 2016-03-01 NOTE — [facility] Group Notes (Signed)
Goals Group  Date/Time: 03/01/2016 9am  Type of Therapy and Topic: Group Therapy: Goals Group: SMART Goals   Pt was called, but did not attend   Polly Barner F. Joshia Kitchings, LCSWA, LCAS  

## 2016-03-01 NOTE — Progress Notes (Signed)
NUTRITION ASSESSMENT  Pt identified as at risk on the Malnutrition Screen Tool  INTERVENTION: 1. Educated patient on the importance of nutrition and encouraged intake of food and beverages. 2. Discussed weight goals. 3. Supplements: If patient requests, provide Ensure Enlive po BID, each supplement provides 350 kcal and 20 grams of protein   NUTRITION DIAGNOSIS: Unintentional weight loss related to sub-optimal intake as evidenced by pt report.   Goal: Pt to meet >/= 90% of their estimated nutrition needs.  Monitor:  PO intake  Assessment:  Jessica [name] is a 41 yo female that presents with schizoaffective disordered bipolar type. [facility]. Currently exhibits a 7#/3.3% insignificant wt loss over 6 months. PO intake thus far has been 0% at 2 meals today. Exhibiting hyperreligious and aggressive behavior, psychosis per MD.   Height: Ht Readings from Last 1 Encounters:  03/01/16 5\' 4"  (1.626 m)    Weight: Wt Readings from Last 1 Encounters:  03/01/16 200 lb (90.7 kg)    Weight Hx: Wt Readings from Last 10 Encounters:  03/01/16 200 lb (90.7 kg)  08/05/15 199 lb 6.4 oz (90.4 kg)  07/25/15 207 lb (93.9 kg)  03/19/13 209 lb 12.8 oz (95.2 kg)  07/26/12 214 lb (97.1 kg)  06/29/11 199 lb 12.8 oz (90.6 kg)  05/19/11 203 lb (92.1 kg)    BMI:  Body mass index is 34.33 kg/m. Pt meets criteria for obese based on current BMI.  Estimated Nutritional Needs: Kcal: 25-30 kcal/kg Protein: > 1 gram protein/kg Fluid: 1 ml/kcal  Diet Order: Diet regular Room service appropriate? Yes; Fluid consistency: Thin Pt is also offered choice of unit snacks mid-morning and mid-afternoon.  Pt is eating as desired.   Lab results and medications reviewed.   Dionne AnoWilliam M. Aubryana Vittorio, MS, RD LDN Inpatient Clinical Dietitian Pager [phone]

## 2016-03-01 NOTE — H&P (Addendum)
Psychiatric Admission Assessment Adult  Patient Identification: Jessica [name] MRN:  [id number] Date of Evaluation:  03/01/2016 Chief Complaint:  Bipolar 1 Principal Diagnosis: Schizoaffective disorder, bipolar type ([place]) Diagnosis:   Patient Active Problem List   Diagnosis Date Noted  . Schizoaffective disorder, bipolar type ([place]) [F25.0] 07/25/2015   History of Present Illness:  Patient is a 41 year old Caucasian female with history of schizoaffective disorder bipolar type. She was brought in by police to [facility] emergency department on January 3rd. Per the emergency departments note patient's mother called EMS after the patient grabbed her arm and wouldn't let go. Upon EMS arrival the patient was standing aggressively in the doorway and saying "[name], [name]. There is too much evil in [place]". She was uncooperative with evaluation yelling occasionally. Nurses in the ER described her as agitated and refusing to dressed out in his scrubs tangential.  It has been reported patient was noncompliant with her medications.  Per chart review she was hospitalized twice last year in behavioral health [place]. Her last admission was in June 2017. She was discharged with a diagnosis of schizoaffective disorder bipolar type. During her stay in the hospital she was placed on known emergency forced medications.  Today during assessment the patient was hyperreligious. She changed her voice during assessment and  stated  she was God talking to me. Her tone of voice was threatening and she got close to my face. The assessment and had to be terminated. Patient then refused to leave the office. Staff was called in the attempted to encourage patient to step out of the office but she refused. Eventually staff had to perform a manual hold.  Patient during assessment did not want to tell me anything about her past psychiatric history. She said that that information was personal. She did not want to tell me  who her psychiatrist was or the medication she was taking. She was unwilling to tell me she was taking medications prior to admission as she had a diagnosis of bipolar disorder.  She denied suicidality, homicidality or having auditory or visual hallucinations. She complained of having poor appetite, poor energy and poor concentration.  I was unable to obtain any information related to substance abuse or trauma  Associated Signs/Symptoms: Depression Symptoms:  insomnia, fatigue, difficulty concentrating, decreased appetite, (Hypo) Manic Symptoms:  Delusions, Labiality of Mood, Anxiety Symptoms:  uncooperative Psychotic Symptoms:  Delusions, Paranoia, PTSD Symptoms: NA Total Time spent with patient: 1 hour  Past Psychiatric History: Patient has been hospitalized several times at behavioral health in [place]. She was just discharged in July 2017.  I don't have any information regarding prior suicidal attempts or self-injurious behaviors  Is the patient at risk to self? Yes.    Has the patient been a risk to self in the past 6 months? No.  Has the patient been a risk to self within the distant past? No.  Is the patient a risk to others? Yes.    Has the patient been a risk to others in the past 6 months? No.  Has the patient been a risk to others within the distant past? No.    Alcohol Screening: 1. How often do you have a drink containing alcohol?: Never 9. Have you or someone else been injured as a result of your drinking?: No 10. Has a relative or friend or a doctor or another health worker been concerned about your drinking or suggested you cut down?: No Alcohol Use Disorder Identification Test Final Score (AUDIT): 0  Brief Intervention: AUDIT score less than 7 or less-screening does not suggest unhealthy drinking-brief intervention not indicated  Past Medical History: There is no known past history of medical issues such as seizures or head trauma Past Medical History:   Diagnosis Date  . Anxiety   . Bipolar 1 disorder ([place])   . Depression   . Schizophrenia ([place])    History reviewed. No pertinent surgical history.  Family History:  Family History  Problem Relation Age of Onset  . Hypertension Mother   . Mental illness Father    Family Psychiatric  History: Unknown at this point as patient is uncooperative  Tobacco Screening: Have you used any form of tobacco in the last 30 days? (Cigarettes, Smokeless Tobacco, Cigars, and/or Pipes): No Tobacco use, Select all that apply: smokeless tobacco use, not daily Are you interested in Tobacco Cessation Medications?: No, patient refused Counseled patient on smoking cessation including recognizing danger situations, developing coping skills and basic information about quitting provided: Yes   Social History: Not much information is available at this time other than she lives with her mother in [place]. 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 Prednisone Other (See Comments)    Prednisone psychosis with anxiety.  Renita Papa [Aripiprazole] Anxiety   Lab Results:  Results for orders placed or performed during the hospital encounter of 02/29/16 (from the past 48 hour(s))  Comprehensive metabolic panel     Status: Abnormal   Collection Time: 02/29/16  9:14 AM  Result Value Ref Range   Sodium 138 135 - 145 mmol/L   Potassium 3.2 (L) 3.5 - 5.1 mmol/L   Chloride 103 101 - 111 mmol/L   CO2 18 (L) 22 - 32 mmol/L   Glucose, Bld 153 (H) 65 - 99 mg/dL   BUN 15 6 - 20 mg/dL    Creatinine, Ser 0.77 0.44 - 1.00 mg/dL   Calcium 8.6 (L) 8.9 - 10.3 mg/dL   Total Protein 7.5 6.5 - 8.1 g/dL   Albumin 4.8 3.5 - 5.0 g/dL   AST 29 15 - 41 U/L   ALT 20 14 - 54 U/L   Alkaline Phosphatase 65 38 - 126 U/L   Total Bilirubin 0.3 0.3 - 1.2 mg/dL   GFR calc non Af Amer >60 >60 mL/min   GFR calc Af Amer >60 >60 mL/min    Comment: (NOTE) The eGFR has been calculated using the CKD EPI equation. This calculation has not been validated in all clinical situations. eGFR's persistently <60 mL/min signify possible Chronic Kidney Disease.    Anion gap 17 (H) 5 - 15  Ethanol     Status: None   Collection Time: 02/29/16  9:14 AM  Result Value Ref Range   Alcohol, Ethyl (B) <5 <5 mg/dL    Comment:        LOWEST DETECTABLE LIMIT FOR SERUM ALCOHOL IS 5 mg/dL FOR MEDICAL PURPOSES ONLY   Salicylate level     Status: None   Collection Time: 02/29/16  9:14 AM  Result Value Ref  Range   Salicylate Lvl <9.7 2.8 - 30.0 mg/dL  Acetaminophen level     Status: Abnormal   Collection Time: 02/29/16  9:14 AM  Result Value Ref Range   Acetaminophen (Tylenol), Serum <10 (L) 10 - 30 ug/mL    Comment:        THERAPEUTIC CONCENTRATIONS VARY SIGNIFICANTLY. A RANGE OF 10-30 ug/mL MAY BE AN EFFECTIVE CONCENTRATION FOR MANY PATIENTS. HOWEVER, SOME ARE BEST TREATED AT CONCENTRATIONS OUTSIDE THIS RANGE. ACETAMINOPHEN CONCENTRATIONS >150 ug/mL AT 4 HOURS AFTER INGESTION AND >50 ug/mL AT 12 HOURS AFTER INGESTION ARE OFTEN ASSOCIATED WITH TOXIC REACTIONS.   CBC with Differential     Status: None   Collection Time: 02/29/16  9:14 AM  Result Value Ref Range   WBC 7.5 4.0 - 10.5 K/uL   RBC 4.54 3.87 - 5.11 MIL/uL   Hemoglobin 13.4 12.0 - 15.0 g/dL   HCT 40.6 36.0 - 46.0 %   MCV 89.4 78.0 - 100.0 fL   MCH 29.5 26.0 - 34.0 pg   MCHC 33.0 30.0 - 36.0 g/dL   RDW 13.4 11.5 - 15.5 %   Platelets 307 150 - 400 K/uL   Neutrophils Relative % 63 %   Neutro Abs 4.7 1.7 - 7.7 K/uL   Lymphocytes  Relative 29 %   Lymphs Abs 2.2 0.7 - 4.0 K/uL   Monocytes Relative 7 %   Monocytes Absolute 0.5 0.1 - 1.0 K/uL   Eosinophils Relative 1 %   Eosinophils Absolute 0.1 0.0 - 0.7 K/uL   Basophils Relative 0 %   Basophils Absolute 0.0 0.0 - 0.1 K/uL  Urinalysis, Routine w reflex microscopic     Status: Abnormal   Collection Time: 02/29/16  2:45 PM  Result Value Ref Range   Color, Urine YELLOW YELLOW   APPearance CLEAR CLEAR   Specific Gravity, Urine 1.020 1.005 - 1.030   pH 5.0 5.0 - 8.0   Glucose, UA NEGATIVE NEGATIVE mg/dL   Hgb urine dipstick SMALL (A) NEGATIVE   Bilirubin Urine NEGATIVE NEGATIVE   Ketones, ur 20 (A) NEGATIVE mg/dL   Protein, ur NEGATIVE NEGATIVE mg/dL   Nitrite NEGATIVE NEGATIVE   Leukocytes, UA NEGATIVE NEGATIVE   RBC / HPF 0-5 0 - 5 RBC/hpf   WBC, UA 0-5 0 - 5 WBC/hpf   Bacteria, UA RARE (A) NONE SEEN   Squamous Epithelial / LPF 0-5 (A) NONE SEEN   Mucous PRESENT   Urine rapid drug screen (hosp performed)     Status: Abnormal   Collection Time: 02/29/16  2:45 PM  Result Value Ref Range   Opiates NONE DETECTED NONE DETECTED   Cocaine NONE DETECTED NONE DETECTED   Benzodiazepines POSITIVE (A) NONE DETECTED   Amphetamines NONE DETECTED NONE DETECTED   Tetrahydrocannabinol NONE DETECTED NONE DETECTED   Barbiturates NONE DETECTED NONE DETECTED    Comment:        DRUG SCREEN FOR MEDICAL PURPOSES ONLY.  IF CONFIRMATION IS NEEDED FOR ANY PURPOSE, NOTIFY LAB WITHIN 5 DAYS.        LOWEST DETECTABLE LIMITS FOR URINE DRUG SCREEN Drug Class       Cutoff (ng/mL) Amphetamine      1000 Barbiturate      200 Benzodiazepine   989 Tricyclics       211 Opiates          300 Cocaine          300 THC  50   Pregnancy, urine     Status: None   Collection Time: 02/29/16  2:45 PM  Result Value Ref Range   Preg Test, Ur NEGATIVE NEGATIVE    Comment:        THE SENSITIVITY OF THIS METHODOLOGY IS >20 mIU/mL.     Blood Alcohol level:  Lab Results   Component Value Date   [facility] <5 02/29/2016   ETH <5 01/65/5374    Metabolic Disorder Labs:  Lab Results  Component Value Date   HGBA1C 5.7 (H) 07/27/2015   MPG 117 07/27/2015   Lab Results  Component Value Date   PROLACTIN 20.7 07/27/2015   Lab Results  Component Value Date   CHOL 161 07/27/2015   TRIG 79 07/27/2015   HDL 50 07/27/2015   CHOLHDL 3.2 07/27/2015   VLDL 16 07/27/2015   LDLCALC 95 07/27/2015    Current Medications: Current Facility-Administered Medications  Medication Dose Route Frequency Provider Last Rate Last Dose  . acetaminophen (TYLENOL) tablet 650 mg  650 mg Oral Q6H PRN Hildred Priest, MD      . alum & mag hydroxide-simeth (MAALOX/MYLANTA) 200-200-20 MG/5ML suspension 30 mL  30 mL Oral Q4H PRN Hildred Priest, MD      . LORazepam (ATIVAN) tablet 2 mg  2 mg Oral Q4H PRN Hildred Priest, MD      . magnesium hydroxide (MILK OF MAGNESIA) suspension 30 mL  30 mL Oral Daily PRN Hildred Priest, MD      . OLANZapine zydis (ZYPREXA) disintegrating tablet 10 mg  10 mg Oral BID Hildred Priest, MD       PTA Medications: Prescriptions Prior to Admission  Medication Sig Dispense Refill Last Dose  . ARIPiprazole (ABILIFY) 5 MG tablet Take 5 mg by mouth daily.   02/29/2016 at Unknown time  . doxepin (SINEQUAN) 10 MG capsule Take 2 capsules (20 mg total) by mouth at bedtime as needed (sleep). 30 capsule 0 unknown  . hydrOXYzine (ATARAX/VISTARIL) 25 MG tablet Take 1 tablet (25 mg total) by mouth every 6 (six) hours as needed for anxiety. 30 tablet 0 unknown  . OLANZapine (ZYPREXA) 2.5 MG tablet Take 5 tablets (12.5 mg total) by mouth at bedtime. (Patient not taking: Reported on 02/29/2016) 30 tablet 0 Not Taking at Unknown time  . OXcarbazepine (TRILEPTAL) 150 MG tablet Take 1 tablet (150 mg total) by mouth 2 (two) times daily. 60 tablet 0 02/29/2016 at Unknown time  . ziprasidone (GEODON) 20 MG capsule Take 1 capsule (20 mg  total) by mouth 2 (two) times daily as needed (SEVERE ANXIETY/AGITATION). 30 capsule 0 unknown    Musculoskeletal: Strength & Muscle Tone: within normal limits Gait & Station: normal Patient leans: N/A  Psychiatric Specialty Exam: Physical Exam  Constitutional: She is oriented to person, place, and time. She appears well-developed and well-nourished.  HENT:  Head: Normocephalic and atraumatic.  Eyes: Conjunctivae and EOM are normal.  Neck: Normal range of motion.  Respiratory: Effort normal.  Musculoskeletal: Normal range of motion.  Neurological: She is alert and oriented to person, place, and time.    Review of Systems  Constitutional: Negative.   HENT: Negative.   Eyes: Negative.   Respiratory: Negative.   Cardiovascular: Negative.   Gastrointestinal: Negative.   Genitourinary: Negative.   Musculoskeletal: Negative.   Skin: Negative.   Neurological: Negative.   Endo/Heme/Allergies: Negative.   Psychiatric/Behavioral: Negative for depression, hallucinations, memory loss, substance abuse and suicidal ideas. The patient is not nervous/anxious and does not have  insomnia.     Blood pressure 135/72, pulse 86, temperature 98.2 F (36.8 C), resp. rate 18, height 5' 4"  (1.626 m), weight 90.7 kg (200 lb), last menstrual period 02/27/2016, SpO2 93 %.Body mass index is 34.33 kg/m.  General Appearance: Fairly Groomed  Eye Contact:  Minimal  Speech:  Normal Rate  Volume:  Normal  Mood:  Irritable  Affect:  Constricted  Thought Process:  Linear and Descriptions of Associations: Intact  Orientation:  Full (Time, Place, and Person)  Thought Content:  Delusions and Paranoid Ideation  Suicidal Thoughts:  No  Homicidal Thoughts:  No  Memory:  Immediate;   Fair Recent;   Fair Remote;   Fair  Judgement:  Impaired  Insight:  Lacking  Psychomotor Activity:  Decreased  Concentration:  Concentration: Poor and Attention Span: Poor  Recall:  [employer] of Knowledge:  Fair  Language:   Good  Akathisia:  No  Handed:    AIMS (if indicated):     Assets:  Armed forces logistics/support/administrative officer Physical Health  ADL's:  Intact  Cognition:  Impaired,  Mild  Sleep:  Number of Hours: 4    Treatment Plan Summary:  Patient is a 41 year old Caucasian female with history of schizoaffective bipolar type. He presented to the emergency department due to aggression, agitation, and psychosis. Patient has not been compliant with medications.  For schizoaffective disorder: Patient will be started on olanzapine 10 mg by mouth twice a day. I will order olanzapine zydis to assure compliance.   Will also start depakote ER 2000 mg qhs.  Will check depakote level in 5-6 days  For agitation I will order Ativan 2 mg every 4 hours as needed  Patient require Leward Quan today as she refused to leave the office where I was interviewing her.   Diet regular  Vital signs daily  Precautions every 15 minute checks  Hospitalization status continue involuntary commitment  Labs we will check TSH, hemoglobin A1c and lipid panel  Collateral information will be obtained from the patient's mother  Chart review has been completed  Labs have been reviewed (pregnancy neg, no UTI.  Utox + for benzos, cbc, cmp wnl)   Physician Treatment Plan for Primary Diagnosis: Schizoaffective disorder, bipolar type ([place]) Long Term Goal(s): Improvement in symptoms so as ready for discharge  Short Term Goals: Ability to identify changes in lifestyle to reduce recurrence of condition will improve, Ability to demonstrate self-control will improve, Ability to identify and develop effective coping behaviors will improve, Compliance with prescribed medications will improve and Ability to identify triggers associated with substance abuse/mental health issues will improve  Physician Treatment Plan for Secondary Diagnosis: Principal Problem:   Schizoaffective disorder, bipolar type ([place])  Long Term Goal(s): Improvement in symptoms so as  ready for discharge  Short Term Goals: Ability to identify changes in lifestyle to reduce recurrence of condition will improve, Ability to verbalize feelings will improve, Ability to demonstrate self-control will improve, Compliance with prescribed medications will improve and Ability to identify triggers associated with substance abuse/mental health issues will improve  I certify that inpatient services furnished can reasonably be expected to improve the patient's condition.    Hildred Priest, MD 1/4/201812:22 PM

## 2016-03-01 NOTE — Progress Notes (Signed)
Recreation Therapy Notes  Date: 01.04.18 Time: 1:00 pm Location: Craft Room  Group Topic: Leisure Education  Goal Area(s) Addresses:  Patient will identify things they are grateful for. Patient will identify how being grateful can influence decision making.  Behavioral Response: Did not attend  Intervention: Grateful Wheel  Activity: Patients were given an I Am Grateful For worksheet and were instructed to write things they are grateful for under each category.  Education: LRT educated patients on why it is important to be grateful.  Education Outcome: Patient did not attend group.  Clinical Observations/Feedback: Patient did not attend group.  Ricahrd Schwager M, LRT/CTRS 03/01/2016 2:10 PM 

## 2016-03-01 NOTE — [facility] Suicide Risk Assessment (Signed)
[facility]   Nursing information obtained from:  Patient Demographic factors:  Low socioeconomic status, Unemployed Current Mental Status:  NA Loss Factors:  Loss of significant relationship, Financial problems / change in socioeconomic status Historical Factors:  Family history of mental illness or substance abuse Risk Reduction Factors:  Living with another person, especially a relative  Total Time spent with patient: 1 hour Principal Problem: Schizoaffective disorder, bipolar type (HCC) Diagnosis:   Patient Active Problem List   Diagnosis Date Noted  . Schizoaffective disorder, bipolar type ([facility]) [F25.0] 07/25/2015   Subjective Data:   Continued Clinical Symptoms:  Alcohol Use Disorder Identification Test Final Score (AUDIT): 0 The "Alcohol Use Disorders Identification Test", Guidelines for Use in Primary Care, Second Edition.  World Science writerHealth Organization [facility](WHO). Score between 0-7:  no or low risk or alcohol related problems. Score between 8-15:  moderate risk of alcohol related problems. Score between 16-19:  high risk of alcohol related problems. Score 20 or above:  warrants further diagnostic evaluation for alcohol dependence and treatment.   CLINICAL FACTORS:   Severe Anxiety and/or Agitation Currently Psychotic Previous Psychiatric Diagnoses and Treatments   Musculoskeletal:   Psychiatric Specialty Exam: Physical Exam  ROS  Blood pressure 135/72, pulse 86, temperature 98.2 F (36.8 C), resp. rate 18, height 5\' 4"  (1.626 m), weight 90.7 kg (200 lb), last menstrual period 02/27/2016, SpO2 93 %.Body mass index is 34.33 kg/m.                                                    Sleep:  Number of Hours: 4      COGNITIVE FEATURES THAT CONTRIBUTE TO RISK:  Loss of executive function    SUICIDE RISK:   Moderate:  Frequent suicidal ideation with limited intensity, and duration, some specificity in terms of plans, no  associated intent, good self-control, limited dysphoria/symptomatology, some risk factors present, and identifiable protective factors, including available and accessible social support.   [facility] CARE[facility]  I certify that inpatient services furnished can reasonably be expected to improve the patient's condition.  Jimmy FootmanHernandez-Gonzalez,  Kentrel Clevenger, MD 03/01/2016, 12:46 PM

## 2016-03-02 MED ORDER — OLANZAPINE 10 MG IM SOLR
10.0000 mg | Freq: Two times a day (BID) | INTRAMUSCULAR | Status: DC
  Filled 2016-03-02: qty 10

## 2016-03-02 MED ORDER — OLANZAPINE 5 MG PO TBDP
10.0000 mg | ORAL_TABLET | Freq: Two times a day (BID) | ORAL | Status: DC
  Administered 2016-03-02 – 2016-03-04 (×6): 10 mg via ORAL
  Administered 2016-03-05: 2.5 mg via ORAL
  Filled 2016-03-02 (×7): qty 2

## 2016-03-02 MED ORDER — DIVALPROEX SODIUM ER 500 MG PO TB24
2000.0000 mg | ORAL_TABLET | Freq: Every day | ORAL | Status: DC
  Administered 2016-03-04: 2000 mg via ORAL
  Filled 2016-03-02 (×3): qty 4

## 2016-03-02 NOTE — Progress Notes (Signed)
[facility] Opinion Progress Note for Medication Administration to Non-consenting Patients (For Involuntarily Committed Patients)  Patient: Jessica [name] Date of Birth: [id number] MRN: [id number]  Reason for the Medication: The patient, without the benefit of the specific treatment measure, is incapable of participating in any available treatment plan that will give the patient a realistic opportunity of improving the patient's condition. There is, without the benefit of the specific treatment measure, a significant possibility that the patient will harm self or others before improvement of the patient's condition is realized.  Consideration of Side Effects: Consideration of the side effects related to the medication plan has been given.  Rationale for Medication Administration: psychotic patient refusing treatment.    Kristine LineaJolanta Raiden Yearwood, MD 03/02/16  11:12 AM   This documentation is good for (7) seven days from the date of the MD signature. New documentation must be completed every seven (7) days with detailed justification in the medical record if the patient requires continued non-emergent administration of psychotropic medications.

## 2016-03-02 NOTE — Progress Notes (Signed)
March 03, 2015.  Patient Identification: Jessica [name] MRN:  [id number] Date of Evaluation:  03/01/2016 Principal Diagnosis: Schizoaffective disorder, bipolar type [facility]:  Patient is a 41 year old Caucasian female with history of schizoaffective disorder bipolar type. She [facility] on January [facility]rd. Per the emergency departments note patient's mother called EMS after the patient grabbed her arm and wouldn't let go. Upon EMS arrival the patient was standing aggressively in the doorway and saying "[place]. There is too much evil in [place]". She was uncooperative with evaluation yelling occasionally. Nurses in the ER described her as agitated and refusing to dressed out in his scrubs, tangential.   It has been reported patient was noncompliant with her medications.  Per chart review she was hospitalized twice last year in behavioral health [place]. Her last admission was in June 2017. She was discharged with a diagnosis of schizoaffective disorder bipolar type. During her stay in the hospital she was placed on known emergency forced medications.   On 03/02/15 during assessment the patient was hyperreligious. She changed her voice during assessment and stated that God talking to me. Her tone of voice was threatening and she got close to my face. The assessment and had to be terminated. Patient then refused to leave the office. Staff was called in the attempted to encourage patient to step out of the office but she refused. Eventually staff had to perform a manual hold.  Patient during assessment did not want to tell me anything about her past psychiatric history. She said that that information was personal. She did not want to tell me who her psychiatrist was or the medication she was taking. She was unwilling to tell me she was taking medications prior to admission as she had a diagnosis of bipolar disorder.  She refused  all her medications on January 4. She has been started on non emergency forced medications. She has not been eating or drinking as she says she is fasting. Today during assessment and she refused to answer any of my questions and just stated "I have no comment".  Patient is not stable for discharge and I recommend to extend her involuntary commitment for up to 30 days.   If more information is needed about this case, please do not hesitated to contact me at [phone].  Sincerely,  Radene JourneyAndrea Hernandez M.D. [phone] [facility]Behavioral [facility]
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
## 2016-03-02 NOTE — Tx Team (Signed)
Interdisciplinary Treatment and Diagnostic Plan Update  03/02/2016 Time of Session: 10:30 AM Jessica ChaletJennifer L [name] MRN: [id number]  Principal Diagnosis: Schizoaffective disorder, bipolar type [facility](HCC)  Secondary Diagnoses: Principal Problem:   Schizoaffective disorder, bipolar type (HCC)   Current Medications:  Current Facility-Administered Medications  Medication Dose Route Frequency Provider Last Rate Last Dose  . acetaminophen (TYLENOL) tablet 650 mg  650 mg Oral Q6H PRN Jimmy FootmanAndrea Hernandez-Gonzalez, MD      . alum & mag hydroxide-simeth (MAALOX/MYLANTA) 200-200-20 MG/5ML suspension 30 mL  30 mL Oral Q4H PRN Jimmy FootmanAndrea Hernandez-Gonzalez, MD      . feeding supplement (ENSURE ENLIVE) (ENSURE ENLIVE) liquid 237 mL  237 mL Oral BID BM Jimmy FootmanAndrea Hernandez-Gonzalez, MD   237 mL at 03/01/16 1545  . LORazepam (ATIVAN) tablet 2 mg  2 mg Oral Q4H PRN Jimmy FootmanAndrea Hernandez-Gonzalez, MD      . magnesium hydroxide (MILK OF MAGNESIA) suspension 30 mL  30 mL Oral Daily PRN Jimmy FootmanAndrea Hernandez-Gonzalez, MD      . OLANZapine (ZYPREXA) injection 10 mg  10 mg Intramuscular BID Jimmy FootmanAndrea Hernandez-Gonzalez, MD       Or  . OLANZapine zydis (ZYPREXA) disintegrating tablet 10 mg  10 mg Oral BID Jimmy FootmanAndrea Hernandez-Gonzalez, MD       PTA Medications: Prescriptions Prior to Admission  Medication Sig Dispense Refill Last Dose  . ARIPiprazole (ABILIFY) 5 MG tablet Take 5 mg by mouth daily.   02/29/2016 at Unknown time  . doxepin (SINEQUAN) 10 MG capsule Take 2 capsules (20 mg total) by mouth at bedtime as needed (sleep). 30 capsule 0 unknown  . hydrOXYzine (ATARAX/VISTARIL) 25 MG tablet Take 1 tablet (25 mg total) by mouth every 6 (six) hours as needed for anxiety. 30 tablet 0 unknown  . OLANZapine (ZYPREXA) 2.5 MG tablet Take 5 tablets (12.5 mg total) by mouth at bedtime. (Patient not taking: Reported on 02/29/2016) 30 tablet 0 Not Taking at Unknown time  . OXcarbazepine (TRILEPTAL) 150 MG tablet Take 1 tablet (150 mg total) by mouth 2 (two)  times daily. 60 tablet 0 02/29/2016 at Unknown time  . ziprasidone (GEODON) 20 MG capsule Take 1 capsule (20 mg total) by mouth 2 (two) times daily as needed (SEVERE ANXIETY/AGITATION). 30 capsule 0 unknown    Patient Stressors: Financial difficulties Marital or family conflict Medication change or noncompliance  Patient Strengths: Ability for Warden/rangerinsight Communication skills Motivation for treatment/growth  Treatment Modalities: Medication Management, Group therapy, Case management,  1 to 1 session with clinician, Psychoeducation, Recreational therapy.   Physician Treatment Plan for Primary Diagnosis: Schizoaffective disorder, bipolar type (HCC) Long Term Goal(s): Improvement in symptoms so as ready for discharge Improvement in symptoms so as ready for discharge   Short Term Goals: Ability to identify changes in lifestyle to reduce recurrence of condition will improve Ability to demonstrate self-control will improve Ability to identify and develop effective coping behaviors will improve Compliance with prescribed medications will improve Ability to identify triggers associated with substance abuse/mental health issues will improve Ability to identify changes in lifestyle to reduce recurrence of condition will improve Ability to verbalize feelings will improve Ability to demonstrate self-control will improve Compliance with prescribed medications will improve Ability to identify triggers associated with substance abuse/mental health issues will improve  Medication Management: Evaluate patient's response, side effects, and tolerance of medication regimen.  Therapeutic Interventions: 1 to 1 sessions, Unit Group sessions and Medication administration.  Evaluation of Outcomes: Progressing  Physician Treatment Plan for Secondary Diagnosis: Principal Problem:   Schizoaffective disorder,  bipolar type (HCC)  Long Term Goal(s): Improvement in symptoms so as ready for discharge Improvement  in symptoms so as ready for discharge   Short Term Goals: Ability to identify changes in lifestyle to reduce recurrence of condition will improve Ability to demonstrate self-control will improve Ability to identify and develop effective coping behaviors will improve Compliance with prescribed medications will improve Ability to identify triggers associated with substance abuse/mental health issues will improve Ability to identify changes in lifestyle to reduce recurrence of condition will improve Ability to verbalize feelings will improve Ability to demonstrate self-control will improve Compliance with prescribed medications will improve Ability to identify triggers associated with substance abuse/mental health issues will improve     Medication Management: Evaluate patient's response, side effects, and tolerance of medication regimen.  Therapeutic Interventions: 1 to 1 sessions, Unit Group sessions and Medication administration.  Evaluation of Outcomes: Progressing   RN Treatment Plan for Primary Diagnosis: Schizoaffective disorder, bipolar type (HCC) Long Term Goal(s): Knowledge of disease  Short Term Goals: Ability to remain free from injury will improve, Ability to demonstrate self-control, Ability to disclose and discuss suicidal ideas and Compliance with prescribed medications will improve  Medication Management: RN will administer medications as ordered by provider, will assess and evaluate patient's response and provide education to patient for prescribed medication. RN will report any adverse and/or side effects to prescribing provider.  Therapeutic Interventions: 1 on 1 counseling sessions, Psychoeducation, Medication administration, Evaluate responses to treatment, Monitor vital signs and CBGs as ordered, Perform/monitor CIWA, COWS, AIMS and Fall Risk screenings as ordered, Perform wound care treatments as ordered.  Evaluation of Outcomes: Progressing   LCSW Treatment Plan  for Primary Diagnosis: Schizoaffective disorder, bipolar type (HCC) Long Term Goal(s): Safe transition to appropriate next level of care at discharge, Engage patient in therapeutic group addressing interpersonal concerns.  Short Term Goals: Engage patient in aftercare planning with referrals and resources, Increase social support, Facilitate acceptance of mental health diagnosis and concerns and Increase skills for wellness and recovery  Therapeutic Interventions: Assess for all discharge needs, 1 to 1 time with Social worker, Explore available resources and support systems, Assess for adequacy in community support network, Educate family and significant other(s) on suicide prevention, Complete Psychosocial Assessment, Interpersonal group therapy.  Evaluation of Outcomes: Progressing   Progress in Treatment: Attending groups: No. Participating in groups: No. Taking medication as prescribed: Yes. Toleration medication: Yes. Family/Significant other contact made: No, will contact:  CSW assessing proper contacts. Patient understands diagnosis: Yes. Discussing patient identified problems/goals with staff: Yes. Medical problems stabilized or resolved: Yes. Denies suicidal/homicidal ideation: Yes. Issues/concerns per patient self-inventory: No.  New problem(s) identified: No, Describe:  None identified.   Reason for Continuation of Hospitalization: Delusions  Hallucinations  Estimated Length of Stay: 7 days  Attendees: Patient: Jessica [name] 03/02/2016 11:18 AM  Physician: Dr. Radene Journey, MD  03/02/2016 11:18 AM  Nursing: Leonia Reader, BSN, RN 03/02/2016 11:18 AM  RN Care Manager: Philbert Riser, BSN, RN 03/02/2016 11:18 AM  Social Worker: Hampton Abbot, MSW, LCSW-A 03/02/2016 11:18 AM  Recreational Therapist: Hershal Coria, LRT, CTRS  03/02/2016 11:18 AM   Scribe for Treatment Team: Lynden Oxford, LCSWA 03/02/2016 11:18 AM

## 2016-03-02 NOTE — Progress Notes (Signed)
Left a message for pt's other who IVC her.  Marikay AlarLinda Lackie [phone]

## 2016-03-02 NOTE — BHH Group Notes (Signed)
[facility] LCSW Group Therapy   03/02/2016  1 PM  Type of Therapy: Group Therapy   Participation Level: Did Not Attend. Patient invited to participate but declined.    Lerin Jech F. Camryn Quesinberry, MSW, LCSWA, LCAS     

## 2016-03-02 NOTE — Progress Notes (Signed)
Patient is religiously preoccupied.  Selectively mute.  First am refused to take medications.  Tried again around noon.  Informed patient that is not agreeable to take medications by mouth that doctor has ordered for them to be given IM.  Patient agreed to zyprexa by mouth. Patient not eating or drinking.  Verbalizes "I am fasting"  Refused am vital signs and am labs.  Support and encouragement offered.  Safety maintained.
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
## 2016-03-02 NOTE — Plan of Care (Signed)
Problem: Safety: Goal: Ability to remain free from injury will improve Outcome: Progressing Pt remains free from injury this shift.

## 2016-03-02 NOTE — Progress Notes (Signed)
Recreation Therapy Notes  Date: 01.05.17 Time: 9:30 am Location: Craft Room  Group Topic: Coping Skills  Goal Area(s) Addresses:  Patient will participate in healthy coping skill. Patient will verbalize benefit of using art as a coping skill.  Behavioral Response: Did not attend  Intervention: Coloring  Activity: Patients were given coloring sheets to color and were instructed to think about what emotions they were feeling and what their minds were focused on.  Education: LRT educated patients on healthy coping skills.  Education Outcome: Patient did not attend group.  Clinical Observations/Feedback: Patient did not attend group.  Jacquelynn CreeGreene,Wynell Halberg M, LRT/CTRS 03/02/2016 10:22 AM

## 2016-03-02 NOTE — Progress Notes (Signed)
Pt spent all evening hours in her room and remains paranoid with fixed mentation on religiosity. Pt unable to be oriented to reality after multiple attempts. Pt became agitated about how the bathroom is built, that it's not enough space for people on a wheelchair. Pt believes that things have been moved around in her room from the way they used to be. Said she used to have a side table that is no longer there. Pt refused to comply with any directions, refused her evening medications, writer took the medication to pt's room but pt refused. Pt not voicing any suicide ideations, will continue to monitor closely for safety.

## 2016-03-03 DIAGNOSIS — F250 Schizoaffective disorder, bipolar type: Principal | ICD-10-CM

## 2016-03-03 NOTE — Progress Notes (Signed)
Affect flat.  Isolates to room.  Periodically walks in hall and makes hand gestures in the air.  Refuses to answer questions regarding hallucinations or depression.  Appetite poor.  Requested to have water, gatorade and ensure.  All 3 provided.  Support and encouragement offered.  Safety maintained.

## 2016-03-03 NOTE — Progress Notes (Signed)
D: Pt denies SI/HI/AVH. Pt is alert and oriented x 4 affect flat not interacting with peers and staff appropriately.   A: Pt was offered support and encouragement. Pt was given scheduled medications. Pt was encouraged to attend groups. Q 15 minute checks were done for safety.  R:Pt did not attend group.Pt is not complaint with medication. Pt has no complaints.Pt receptive to treatment and safety maintained on unit.

## 2016-03-03 NOTE — Progress Notes (Signed)
LCSW went to engage with patient and she refused to sign consents and stated she would not make it to therapeutic group at all today. LCSW thanked her for her time and gently left room and closed her door as she requested. Patient was polite but non compliant.  Cristian Grieves LCSW

## 2016-03-03 NOTE — Plan of Care (Signed)
Problem: Safety: Goal: Ability to remain free from injury will improve Outcome: Progressing Patient able to stay free of injury at this time [facility]

## 2016-03-03 NOTE — Progress Notes (Signed)
D: Patient is alert and oriented on the unit this shift. Patient not attended and actively participated in groups today. Patient denies suicidal ideation, homicidal ideation, auditory or visual hallucinations at the present time. States she is afraid of something but will not tell me what that is at this time. A: Scheduled medications are administered to patient as per MD orders. Emotional support and encouragement are provided. Patient is maintained on q.15 minute safety checks. Patient is informed to notify staff with questions or concerns. R: No adverse medication reactions are noted. Patient is cooperative with medication administration   . Patient is receptive,anxious,  calm and cooperative on the unit at this time. Patient does not interact  with others on the unit this shift. Patient contracts for safety at this time. Patient remains safe at this time. Anxiety 8/10 Depression 8/10

## 2016-03-03 NOTE — Progress Notes (Signed)
Patient ID: Jessica ChaletJennifer L Shankar, female   DOB: 06/08/1975, 41 y.o.   MRN: [id number]                                                      [facility]  03/03/2016 1:36 PM Jessica ChaletJennifer L Slivka  MRN:  [id number]  Patient is a 41 year old Caucasian female with history of schizoaffective disorder bipolar type.   1/6 patient refused  medications , on FMP, slept well. Pt is very guarded, paranoid and uncooperative, States  " may be " " its private" to hallucination question, refused to elabotrate. States " I don't wanna answer that question " when asked about paranoia. Poor insight, was lying in bed in her room.     Per nursing: Pt  remains paranoid , hyper religious.  unable to be oriented to reality  Pt refused to comply with any directions, refused her  medications, Pt not voicing any suicide ideations, will continue to monitor closely for safety.  Principal Problem: Schizoaffective disorder, bipolar type (HCC) Diagnosis:       Patient Active Problem List   Diagnosis Date Noted  . Schizoaffective disorder, bipolar type (HCC) [F25.0] 07/25/2015   Total Time spent with patient: 30 minutes  Past Psychiatric History: Patient has been hospitalized several times at behavioral health in [place]. She was just discharged in July 2017.  I don't have any information regarding prior suicidal attempts or self-injurious behaviors   Past Medical History:      Past Medical History:  Diagnosis Date  . Anxiety   . Bipolar 1 disorder (HCC)   . Depression   . Schizophrenia (HCC)    History reviewed. No pertinent surgical history.  Family History:       Family History  Problem Relation Age of Onset  . Hypertension Mother   . Mental illness Father    Family Psychiatric  History: Unknown at this point as patient is uncooperative   Social History: Not much information is available at this time other than she lives with her mother in [place]. 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 Marital status: Single    Spouse name: N/A  . Number of children: N/A  . Years of education: N/A       Social History Main Topics  . Smoking status: Never Smoker  . Smokeless tobacco: Never Used  . Alcohol use No  . Drug use: No  . Sexual activity: No       Other Topics Concern  . None      Social History Narrative  . None   Additional Social History:    Pain Medications: none Prescriptions: none Over the Counter: none History of alcohol / drug use?: No history of alcohol / drug abuse Negative Consequences of Use: Personal relationships, Financial, Work / School Withdrawal Symptoms: Other (Comment)      Current Medications:          Current Facility-Administered Medications  Medication Dose Route Frequency Provider Last Rate Last Dose  . acetaminophen (TYLENOL) tablet 650 mg  650 mg Oral Q6H  PRN Jimmy Footman, MD      . alum & mag hydroxide-simeth (MAALOX/MYLANTA) 200-200-20 MG/5ML suspension 30 mL  30 mL Oral Q4H PRN Jimmy Footman, MD      . feeding supplement (ENSURE ENLIVE) (ENSURE ENLIVE) liquid 237 mL  237 mL Oral BID BM Jimmy Footman, MD   237 mL at 03/01/16 1545  . LORazepam (ATIVAN) tablet 2 mg  2 mg Oral Q4H PRN Jimmy Footman, MD      . magnesium hydroxide (MILK OF MAGNESIA) suspension 30 mL  30 mL Oral Daily PRN Jimmy Footman, MD      . OLANZapine (ZYPREXA) injection 10 mg  10 mg Intramuscular BID Jimmy Footman, MD       Or  . OLANZapine zydis (ZYPREXA) disintegrating tablet 10 mg  10 mg Oral BID Jimmy Footman, MD   10 mg at 03/02/16 1209    Lab Results:  Lab Results Last 48 Hours        Results for orders placed or performed during the hospital encounter of 02/29/16 (from the past 48 hour(s))  Urinalysis,  Routine w reflex microscopic     Status: Abnormal   Collection Time: 02/29/16  2:45 PM  Result Value Ref Range   Color, Urine YELLOW YELLOW   APPearance CLEAR CLEAR   Specific Gravity, Urine 1.020 1.005 - 1.030   pH 5.0 5.0 - 8.0   Glucose, UA NEGATIVE NEGATIVE mg/dL   Hgb urine dipstick SMALL (A) NEGATIVE   Bilirubin Urine NEGATIVE NEGATIVE   Ketones, ur 20 (A) NEGATIVE mg/dL   Protein, ur NEGATIVE NEGATIVE mg/dL   Nitrite NEGATIVE NEGATIVE   Leukocytes, UA NEGATIVE NEGATIVE   RBC / HPF 0-5 0 - 5 RBC/hpf   WBC, UA 0-5 0 - 5 WBC/hpf   Bacteria, UA RARE (A) NONE SEEN   Squamous Epithelial / LPF 0-5 (A) NONE SEEN   Mucous PRESENT   Urine rapid drug screen (hosp performed)     Status: Abnormal   Collection Time: 02/29/16  2:45 PM  Result Value Ref Range   Opiates NONE DETECTED NONE DETECTED   Cocaine NONE DETECTED NONE DETECTED   Benzodiazepines POSITIVE (A) NONE DETECTED   Amphetamines NONE DETECTED NONE DETECTED   Tetrahydrocannabinol NONE DETECTED NONE DETECTED   Barbiturates NONE DETECTED NONE DETECTED    Comment:        DRUG SCREEN FOR MEDICAL PURPOSES ONLY.  IF CONFIRMATION IS NEEDED FOR ANY PURPOSE, NOTIFY LAB WITHIN 5 DAYS.        LOWEST DETECTABLE LIMITS FOR URINE DRUG SCREEN Drug Class       Cutoff (ng/mL) Amphetamine      1000 Barbiturate      200 Benzodiazepine   200 Tricyclics       300 Opiates          300 Cocaine          300 THC              50  Pregnancy, urine     Status: None   Collection Time: 02/29/16  2:45 PM  Result Value Ref Range   Preg Test, Ur NEGATIVE NEGATIVE    Comment:        THE SENSITIVITY OF THIS METHODOLOGY IS >20 mIU/mL.      Blood Alcohol level:  Recent Labs       Lab Results  Component Value Date   [facility] <5 02/29/2016   ETH <5 08/03/2015      Metabolic Disorder Labs:  Recent Labs       Lab Results  Component Value Date   HGBA1C 5.7 (H) 07/27/2015   MPG 117 07/27/2015      Recent Labs       Lab Results  Component Value Date   PROLACTIN 20.7 07/27/2015     Recent Labs       Lab Results  Component Value Date   CHOL 161 07/27/2015   TRIG 79 07/27/2015   HDL 50 07/27/2015   CHOLHDL 3.2 07/27/2015   VLDL 16 07/27/2015   LDLCALC 95 07/27/2015      Physical Findings: AIMS:  , ,  ,  ,    CIWA:    COWS:  COWS Total Score: 1  Musculoskeletal: Strength & Muscle Tone: within normal limits Gait & Station: normal Patient leans: N/A  Psychiatric Specialty Exam: Physical Exam  Constitutional: She is oriented to person, place, and time. She appears well-developed and well-nourished.  HENT:  Head: Normocephalic and atraumatic.  Eyes: Conjunctivae and EOM are normal.  Neck: Normal range of motion.  Respiratory: Effort normal.  Musculoskeletal: Normal range of motion.  Neurological: She is alert and oriented to person, place, and time.    Review of Systems  Unable to perform ROS: Acuity of condition    Blood pressure (!) 153/87, pulse (!) 104, temperature 97.7 F (36.5 C), resp. rate 18, height 5\' 4"  (1.626 m), weight 90.7 kg (200 lb), last menstrual period 02/27/2016, SpO2 93 %.Body mass index is 34.33 kg/m.  General Appearance: Disheveled  Eye Contact:  Minimal  Speech:  Normal Rate  Volume:  Normal  Mood:  Irritable  Affect:  Constricted  Thought Process:  Disorganized and Descriptions of Associations: Loose  Orientation:  Full (Time, Place, and Person)  Thought Content:  Delusions  Suicidal Thoughts:  No  Homicidal Thoughts:  No  Memory:  Immediate;   Poor Recent;   Poor Remote;   Poor  Judgement:  Impaired  Insight:  Lacking  Psychomotor Activity:  Decreased  Concentration:  Concentration: Poor and Attention Span: Poor  Recall:  Poor  Fund of Knowledge:  Poor  Language:  Fair  Akathisia:  No  Handed:    AIMS (if indicated):     Assets:  Communication Skills Leisure Time  ADL's:  Intact  Cognition:  Impaired,   Mild  Sleep:  Number of Hours: 7.45     Treatment Plan Summary:  Patient is a 41 year old Caucasian female with history of schizoaffective bipolar type. He presented to the emergency department due to aggression, agitation, and psychosis. Patient has not been compliant with medications.  For schizoaffective disorder: Patient has been started on olanzapine 10 mg by mouth twice a day.  Patient has been started unknown emergency forced medications. She continues to refuse olanzapine 10 mg by mouth she will receive olanzapine 10 mg IM  Will also start depakote ER 2000 mg qhs---- she refused Depakote last night  For agitation: continue Ativan 2 mg every 4 hours as needed  Patient require Manual Hold today as she refused to leave the office where I was interviewing her on 1/4.   Diet regular  Vital signs will change to tid as patient has not been eating or drinking  I will add orders for pushing fluids.  Precautions every 15 minute checks  Hospitalization status continue involuntary commitment  Labs we will check TSH, hemoglobin A1c and lipid panel---has refused all labs  Collateral information will be obtained from the patient's mother  Chart  review has been completed  Labs have been reviewed (pregnancy neg, no UTI. Utox + for benzos, cbc, cmp wnl). We will try to get new labs this weekend including basic metabolic panel.

## 2016-03-03 NOTE — [facility] Group Notes (Signed)
Patient did not attend therapeutic group today.  Kinney Sackmann LCSW

## 2016-03-04 NOTE — Progress Notes (Signed)
Pt in hallway, waiting for medication administration in medication room. Medication compliant. Safety maintained. Will continue to monitor.

## 2016-03-04 NOTE — Progress Notes (Signed)
Pt awake and alert today, spends most of her time in her room in the bed. Cooperative on approach. Appears to be responding to internal stimuli, stares away from this nurse when speaking, comes into hallway sometimes to pace, noted to be making gestures with her hands/arms while walking. Hyper-religious. Refused food until lunch because "i'm still fasting." Drank Gatorade and ensure this morning. Pt did come out for lunch with much encouragement and ate maybe 20% per the MHT. Pt has been medication compliant today, came into medication room herself and asked for her morning meds. Denies SI/HI. Will not answer regarding hallucinations.   Support and encouragement provided with use of therapeutic communication. Medications administered as ordered with education. Safety maintained with every 15 minute checks. Will continue to monitor.

## 2016-03-04 NOTE — Progress Notes (Signed)
Patient did not attend group.  Alejandro Gamel LCSW

## 2016-03-04 NOTE — Progress Notes (Signed)
Pt lying in bed and requests Gatorade soon after breakfast was brought to unit. Refuses to go to dayroom and eat breakfast. Gatorade provided. Safety maintained. Will continue to monitor.

## 2016-03-04 NOTE — [facility] Suicide Risk Assessment (Signed)
[facility] INPATIENT:  Family/Significant Other Suicide Prevention Education  Suicide Prevention Education:  Patient Refusal for Family/Significant Other Suicide Prevention Education: The patient Drucilla ChaletJennifer L Chadderdon has refused to provide written consent for family/significant other to be provided Family/Significant Other Suicide Prevention Education during admission and/or prior to discharge.  Physician notified.  Channelle Bottger [name] MSW, LCSWA 03/04/2016, 11:37 AM

## 2016-03-04 NOTE — Progress Notes (Signed)
Patient ID: Jessica [name], female   DOB: 07-12-1975, 41 y.o.   MRN: [id number]                                                      [facility] MD Progress NOTE  03/04/2016 1:33 PM Jessica [name]  MRN:  [id number]  Patient is a 41 year old Caucasian female with history of schizoaffective disorder bipolar type.   1/7 patient took po meds to avoid Forced med. , has poor insight, slept well. Pt is still  guarded, paranoid and uncooperative, admits hearing voices, refuses to elaborate, states " not at this time".  Poor insight, was lying in bed in her room.   refusing food, but taking fluids/ [place].  Per nursing: Pt  remains paranoid , hyper religious.  unable to be oriented to reality  Pt refused to comply with any directions, refused her  medications, Pt not voicing any suicide ideations, will continue to monitor closely for safety.  Principal Problem: Schizoaffective disorder, bipolar type (HCC) Diagnosis:       Patient Active Problem List   Diagnosis Date Noted  . Schizoaffective disorder, bipolar type (HCC) [F25.0] 07/25/2015   Total Time spent with patient: 30 minutes  Past Psychiatric History: Patient has been hospitalized several times at behavioral health in [place]. She was just discharged in July 2017.  I don't have any information regarding prior suicidal attempts or self-injurious behaviors   Past Medical History:      Past Medical History:  Diagnosis Date  . Anxiety   . Bipolar 1 disorder (HCC)   . Depression   . Schizophrenia (HCC)    History reviewed. No pertinent surgical history.  Family History:       Family History  Problem Relation Age of Onset  . Hypertension Mother   . Mental illness Father    Family Psychiatric  History: Unknown at this point as patient is uncooperative   Social History: Not much information is available at this time other than she lives with her mother in [place]. 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 Marital status: Single    Spouse name: N/A  . Number of children: N/A  . Years of education: N/A       Social History Main Topics  . Smoking status: Never Smoker  . Smokeless tobacco: Never Used  . Alcohol use No  . Drug use: No  . Sexual activity: No       Other Topics Concern  . None      Social History Narrative  . None   Additional Social History:    Pain Medications: none Prescriptions: none Over the Counter: none History of alcohol / drug use?: No history of alcohol / drug abuse Negative Consequences of Use: Personal relationships, Financial, Work / School Withdrawal Symptoms: Other (Comment)      Current Medications:          Current Facility-Administered Medications  Medication Dose Route Frequency Provider Last Rate Last Dose  . acetaminophen (TYLENOL) tablet 650 mg  650 mg Oral Q6H PRN Jimmy Footman, MD      .  alum & mag hydroxide-simeth (MAALOX/MYLANTA) 200-200-20 MG/5ML suspension 30 mL  30 mL Oral Q4H PRN Jimmy Footman, MD      . feeding supplement (ENSURE ENLIVE) (ENSURE ENLIVE) liquid 237 mL  237 mL Oral BID BM Jimmy Footman, MD   237 mL at 03/01/16 1545  . LORazepam (ATIVAN) tablet 2 mg  2 mg Oral Q4H PRN Jimmy Footman, MD      . magnesium hydroxide (MILK OF MAGNESIA) suspension 30 mL  30 mL Oral Daily PRN Jimmy Footman, MD      . OLANZapine (ZYPREXA) injection 10 mg  10 mg Intramuscular BID Jimmy Footman, MD       Or  . OLANZapine zydis (ZYPREXA) disintegrating tablet 10 mg  10 mg Oral BID Jimmy Footman, MD   10 mg at 03/02/16 1209    Lab Results:  Lab Results Last 48 Hours        Results for orders placed or performed during the hospital encounter of 02/29/16 (from the past 48 hour(s))  Urinalysis, Routine w  reflex microscopic     Status: Abnormal   Collection Time: 02/29/16  2:45 PM  Result Value Ref Range   Color, Urine YELLOW YELLOW   APPearance CLEAR CLEAR   Specific Gravity, Urine 1.020 1.005 - 1.030   pH 5.0 5.0 - 8.0   Glucose, UA NEGATIVE NEGATIVE mg/dL   Hgb urine dipstick SMALL (A) NEGATIVE   Bilirubin Urine NEGATIVE NEGATIVE   Ketones, ur 20 (A) NEGATIVE mg/dL   Protein, ur NEGATIVE NEGATIVE mg/dL   Nitrite NEGATIVE NEGATIVE   Leukocytes, UA NEGATIVE NEGATIVE   RBC / HPF 0-5 0 - 5 RBC/hpf   WBC, UA 0-5 0 - 5 WBC/hpf   Bacteria, UA RARE (A) NONE SEEN   Squamous Epithelial / LPF 0-5 (A) NONE SEEN   Mucous PRESENT   Urine rapid drug screen (hosp performed)     Status: Abnormal   Collection Time: 02/29/16  2:45 PM  Result Value Ref Range   Opiates NONE DETECTED NONE DETECTED   Cocaine NONE DETECTED NONE DETECTED   Benzodiazepines POSITIVE (A) NONE DETECTED   Amphetamines NONE DETECTED NONE DETECTED   Tetrahydrocannabinol NONE DETECTED NONE DETECTED   Barbiturates NONE DETECTED NONE DETECTED    Comment:        DRUG SCREEN FOR MEDICAL PURPOSES ONLY.  IF CONFIRMATION IS NEEDED FOR ANY PURPOSE, NOTIFY LAB WITHIN 5 DAYS.        LOWEST DETECTABLE LIMITS FOR URINE DRUG SCREEN Drug Class       Cutoff (ng/mL) Amphetamine      1000 Barbiturate      200 Benzodiazepine   200 Tricyclics       300 Opiates          300 Cocaine          300 THC              50  Pregnancy, urine     Status: None   Collection Time: 02/29/16  2:45 PM  Result Value Ref Range   Preg Test, Ur NEGATIVE NEGATIVE    Comment:        THE SENSITIVITY OF THIS METHODOLOGY IS >20 mIU/mL.      Blood Alcohol level:  Recent Labs       Lab Results  Component Value Date   [facility] <5 02/29/2016   ETH <5 08/03/2015      Metabolic Disorder Labs: Recent Labs       Lab Results  Component Value Date   HGBA1C 5.7 (H) 07/27/2015   MPG 117 07/27/2015     Recent Labs        Lab Results  Component Value Date   PROLACTIN 20.7 07/27/2015     Recent Labs       Lab Results  Component Value Date   CHOL 161 07/27/2015   TRIG 79 07/27/2015   HDL 50 07/27/2015   CHOLHDL 3.2 07/27/2015   VLDL 16 07/27/2015   LDLCALC 95 07/27/2015      Physical Findings: AIMS:  , ,  ,  ,    CIWA:    COWS:  COWS Total Score: 1  Musculoskeletal: Strength & Muscle Tone: within normal limits Gait & Station: normal Patient leans: N/A  Psychiatric Specialty Exam: Physical Exam  Constitutional: She is oriented to person, place, and time. She appears well-developed and well-nourished.  HENT:  Head: Normocephalic and atraumatic.  Eyes: Conjunctivae and EOM are normal.  Neck: Normal range of motion.  Respiratory: Effort normal.  Musculoskeletal: Normal range of motion.  Neurological: She is alert and oriented to person, place, and time.    Review of Systems  Unable to perform ROS: Acuity of condition    Blood pressure (!) 153/87, pulse (!) 104, temperature 97.7 F (36.5 C), resp. rate 18, height 5\' 4"  (1.626 m), weight 90.7 kg (200 lb), last menstrual period 02/27/2016, SpO2 93 %.Body mass index is 34.33 kg/m.  General Appearance: Disheveled, guarded  Eye Contact:  Minimal  Speech:  Normal Rate  Volume:  Normal  Mood:  Irritable  Affect-  Labile, irritable,   Thought Process:  Disorganized and Descriptions of Associations: Loose  Orientation:  Full (Time, Place, and Person)  Thought Content:  Delusions  Suicidal Thoughts:  No  Homicidal Thoughts:  No  Memory:  Immediate;   Poor Recent;   Poor Remote;   Poor  Judgement:  Impaired  Insight:  Lacking  Psychomotor Activity:  Decreased  Concentration:  Concentration: Poor and Attention Span: Poor  Recall:  Poor  Fund of Knowledge:  Poor  Language:  Fair  Akathisia:  No  Handed:    AIMS (if indicated):     Assets:  Communication Skills Leisure Time  ADL's:  Intact  Cognition:  Impaired,   Mild  Sleep:  Number of Hours: 7.45     Treatment Plan Summary:  Patient is a 41 year old Caucasian female with history of schizoaffective bipolar type. He presented to the emergency department due to aggression, agitation, and psychosis. Patient has not been compliant with medications.  For schizoaffective disorder: Patient has been started on olanzapine 10 mg by mouth twice a day.  Patient has been started unknown emergency forced medications. She continues to refuse olanzapine 10 mg by mouth she will receive olanzapine 10 mg IM  Will also start depakote ER 2000 mg qhs---- she refused Depakote last night  For agitation: continue Ativan 2 mg every 4 hours as needed  Patient require Manual Hold today as she refused to leave the office where I was interviewing her on 1/4.   Diet regular- continue to encourage to take food and fluids  Vital signs will change to tid as patient has not been eating or drinking   Precautions every 15 minute checks  Hospitalization status continue involuntary commitment  Labs we will check TSH, hemoglobin A1c and lipid panel---has refused all labs  Collateral information will be obtained from the patient's mother  Chart review has been completed  Labs have  been reviewed (pregnancy neg, no UTI. Utox + for benzos, cbc, cmp wnl). We will try to get new labs this weekend including basic metabolic panel. Patient ID: Jessica [name], female   DOB: 1975-10-04, 41 y.o.   MRN: [id number]

## 2016-03-04 NOTE — Plan of Care (Signed)
Problem: Education: Goal: Will be free of psychotic symptoms Outcome: Progressing Continues to appear to be hyper-religious, responding to internal stimuli. Complaint with meds today. Refused food this morning, but did come out for lunch after much encouragement.

## 2016-03-04 NOTE — [facility] Group Notes (Signed)
[facility] Group Notes:  (Nursing/MHT/Case Management/Adjunct)  Date:  03/04/2016  Time:  5:02 AM  Type of Therapy:  Psychoeducational Skills  Participation Level:  Did Not Attend  Summary of Progress/Problems:  Jessica MilroyLaquanda Y [name] 03/04/2016, 5:02 AM

## 2016-03-05 MED ORDER — ADULT MULTIVITAMIN W/MINERALS CH
1.0000 | ORAL_TABLET | Freq: Every day | ORAL | Status: DC
  Administered 2016-03-05 – 2016-03-24 (×19): 1 via ORAL
  Filled 2016-03-05 (×22): qty 1

## 2016-03-05 MED ORDER — ENSURE ENLIVE PO LIQD
237.0000 mL | Freq: Three times a day (TID) | ORAL | Status: DC
  Administered 2016-03-05 – 2016-03-21 (×34): 237 mL via ORAL

## 2016-03-05 MED ORDER — OXCARBAZEPINE 300 MG PO TABS
300.0000 mg | ORAL_TABLET | Freq: Two times a day (BID) | ORAL | Status: DC
  Administered 2016-03-05 – 2016-03-25 (×40): 300 mg via ORAL
  Filled 2016-03-05 (×40): qty 1

## 2016-03-05 MED ORDER — ARIPIPRAZOLE 10 MG PO TABS
15.0000 mg | ORAL_TABLET | Freq: Every day | ORAL | Status: DC
  Administered 2016-03-05 – 2016-03-06 (×2): 15 mg via ORAL
  Filled 2016-03-05 (×2): qty 2

## 2016-03-05 NOTE — Progress Notes (Signed)
Recreation Therapy Notes  Date: 01.08.18 Time: 9:30 am Location: Craft Room  Group Topic: Self-expression  Goal Area(s) Addresses:  Patient will effectively use as a means of self-expression. Patient will recognize positive benefit of self-expression. Patient will be able to identify one emotion experienced during group session. Patient will identify use of art as a coping skill.  Behavioral Response: Did not attend  Intervention: Two Faces of Me  Activity: Patients were given a blank face worksheet and were instructed to draw a line down the middle. On one side of the worksheet, patients were instructed to draw or write how they felt when they were admitted to the hospital. On the other side, patients were instructed to draw or write how they want to feel when they are d/c.  Education: LRT educated patients on other forms of self-expression.  Education Outcome: Patient did not attend group.   Clinical Observations/Feedback: Patient did not attend group.  Caspar Favila M, LRT/CTRS 03/05/2016 10:19 AM 

## 2016-03-05 NOTE — Progress Notes (Signed)
[facility] MD Progress Note  03/05/2016 9:40 AM Jessica [name]  MRN:  [id number] Subjective:  Patient is a 41 year old Caucasian female with history of schizoaffective disorder bipolar type. She was brought in by police to [facility] emergency department on January 3rd. Per the emergency departments note patient's mother called EMS after the patient grabbed her arm and wouldn't let go. Upon EMS arrival the patient was standing aggressively in the doorway and saying "[place], [place]. There is too much evil in [place]". She was uncooperative with evaluation yelling occasionally. Nurses in the ER described her as agitated and refusing to dressed out in his scrubs tangential.  It has been reported patient was noncompliant with her medications.  Per chart review she was hospitalized twice last year in behavioral health [place]. Her last admission was in June 2017. She was discharged with a diagnosis of schizoaffective disorder bipolar type. During her stay in the hospital she was placed on known emergency forced medications.  1/5 patient refused all medications yesterday. She had a Manual hold on 1/4 after she refused to leave the office where she was being interviewed. Today she has been started unknown emergency forced medications. She refuses oral olanzapine earlier this morning but took it later on. Patient has been refusing to eat or drink anything. She has been in bed all day. She refused to speak with me during assessment today her only answer was "I have no comment". She refused to open her mouth to evaluate for dehydration.  1/8 patient was more cooperative and engaging assessment today. She tells me she's been diagnosed with bipolar disorder and was prescribed with Trileptal and Abilify. She wonders if she can take these medications again as they help her. Patient tells me she has slept well over the last 2 nights. She is not having homicidal or suicidal ideation. She has been having visual  hallucinations which she sees as periods she says she does not feel threatened or scared when she sees the spirit. As far as side effects that she felt little lightheaded this morning were no other side effects. Last week she was refusing to eat now she is saying she is feels her appetite is very low but she tried to eat some last night. She tells me she's been drinking ensures and Gatorades.   She has been compliant with most medications but has refused Depakote. This morning she only took 2.5 mg of olanzapine not the whole dose order which was 10 mg.    Per nursing: Pt awake and alert today, spends most of her time in her room in the bed. Cooperative on approach. Appears to be responding to internal stimuli, stares away from this nurse when speaking, comes into hallway sometimes to pace, noted to be making gestures with her hands/arms while walking. Hyper-religious. Refused food until lunch because "i'm still fasting." Drank Gatorade and ensure this morning. Pt did come out for lunch with much encouragement and ate maybe 20% per the MHT. Pt has been medication compliant today, came into medication room herself and asked for her morning meds. Denies SI/HI. Will not answer regarding hallucinations.   Support and encouragement provided with use of therapeutic communication. Medications administered as ordered with education. Safety maintained with every 15 minute checks. Will continue to monitor.   Principal Problem: Schizoaffective disorder, bipolar type (HCC) Diagnosis:   Patient Active Problem List   Diagnosis Date Noted  . Schizoaffective disorder, bipolar type (HCC) [F25.0] 07/25/2015   Total Time spent with patient: 30  minutes  Past Psychiatric History: Patient has been hospitalized several times at behavioral health in [place]. She was just discharged in July 2017.  I don't have any information regarding prior suicidal attempts or self-injurious behaviors   Past Medical History:   Past Medical History:  Diagnosis Date  . Anxiety   . Bipolar 1 disorder (HCC)   . Depression   . Schizophrenia (HCC)    History reviewed. No pertinent surgical history.  Family History:  Family History  Problem Relation Age of Onset  . Hypertension Mother   . Mental illness Father    Family Psychiatric  History: Unknown at this point as patient is uncooperative   Social History: Not much information is available at this time other than she lives with her mother in [place]. 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 Marital status: Single    Spouse name: N/A  . Number of children: N/A  . Years of education: N/A   Social History Main Topics  . Smoking status: Never Smoker  . Smokeless tobacco: Never Used  . Alcohol use No  . Drug use: No  . Sexual activity: No   Other Topics Concern  . None   Social History Narrative  . None   Additional Social History:    Pain Medications: none Prescriptions: none Over the Counter: none History of alcohol / drug use?: No history of alcohol / drug abuse Negative Consequences of Use: Personal relationships, Financial, Work / School Withdrawal Symptoms: Other (Comment)      Current Medications: Current Facility-Administered Medications  Medication Dose Route Frequency Provider Last Rate Last Dose  . acetaminophen (TYLENOL) tablet 650 mg  650 mg Oral Q6H PRN Jimmy FootmanAndrea Hernandez-Gonzalez, MD      . alum & mag hydroxide-simeth (MAALOX/MYLANTA) 200-200-20 MG/5ML suspension 30 mL  30 mL Oral Q4H PRN Jimmy FootmanAndrea Hernandez-Gonzalez, MD      . divalproex (DEPAKOTE ER) 24 hr tablet 2,000 mg  2,000 mg Oral QHS Jimmy FootmanAndrea Hernandez-Gonzalez, MD   2,000 mg at 03/04/16 2120  . feeding supplement (ENSURE ENLIVE) (ENSURE ENLIVE) liquid 237 mL  237 mL Oral BID BM Jimmy FootmanAndrea Hernandez-Gonzalez, MD   237 mL at 03/04/16 1400  .  LORazepam (ATIVAN) tablet 2 mg  2 mg Oral Q4H PRN Jimmy FootmanAndrea Hernandez-Gonzalez, MD   2 mg at 03/05/16 0855  . magnesium hydroxide (MILK OF MAGNESIA) suspension 30 mL  30 mL Oral Daily PRN Jimmy FootmanAndrea Hernandez-Gonzalez, MD      . OLANZapine (ZYPREXA) injection 10 mg  10 mg Intramuscular BID Jimmy FootmanAndrea Hernandez-Gonzalez, MD       Or  . OLANZapine zydis (ZYPREXA) disintegrating tablet 10 mg  10 mg Oral BID Jimmy FootmanAndrea Hernandez-Gonzalez, MD   2.5 mg at 03/05/16 [id number]    Lab Results:  No results found for this or any previous visit (from the past 48 hour(s)).  Blood Alcohol level:  Lab Results  Component Value Date   [facility]   ETH <5 08/03/2015    Metabolic Disorder Labs: Lab Results  Component Value Date   HGBA1C 5.7 (H) 07/27/2015   MPG 117 07/27/2015   Lab Results  Component Value Date   PROLACTIN 20.7 07/27/2015   Lab Results  Component Value Date   CHOL 161 07/27/2015   TRIG 79 07/27/2015   HDL 50 07/27/2015  CHOLHDL 3.2 07/27/2015   VLDL 16 07/27/2015   LDLCALC 95 07/27/2015    Physical Findings: AIMS:  , ,  ,  ,    CIWA:    COWS:  COWS Total Score: 1  Musculoskeletal: Strength & Muscle Tone: within normal limits Gait & Station: normal Patient leans: N/A  Psychiatric Specialty Exam: Physical Exam  Constitutional: She is oriented to person, place, and time. She appears well-developed and well-nourished.  HENT:  Head: Normocephalic and atraumatic.  Eyes: Conjunctivae and EOM are normal.  Neck: Normal range of motion.  Respiratory: Effort normal.  Musculoskeletal: Normal range of motion.  Neurological: She is alert and oriented to person, place, and time.    Review of Systems  Unable to perform ROS: Acuity of condition  Constitutional: Negative.   HENT: Negative.   Eyes: Negative.   Respiratory: Negative.   Cardiovascular: Negative.   Gastrointestinal: Negative.   Genitourinary: Negative.   Musculoskeletal: Negative.   Skin: Negative.   Neurological:  Negative.   Endo/Heme/Allergies: Negative.   Psychiatric/Behavioral: Positive for hallucinations. Negative for depression, memory loss, substance abuse and suicidal ideas. The patient is not nervous/anxious and does not have insomnia.     Blood pressure 130/87, pulse 89, temperature 97.8 F (36.6 C), temperature source Oral, resp. rate 20, height 5\' 4"  (1.626 m), weight 90.7 kg (200 lb), last menstrual period 02/27/2016, SpO2 93 %.Body mass index is 34.33 kg/m.  General Appearance: Disheveled  Eye Contact:  Minimal  Speech:  Normal Rate  Volume:  Normal  Mood:  Irritable  Affect:  Constricted  Thought Process:  Disorganized and Descriptions of Associations: Loose  Orientation:  Full (Time, Place, and Person)  Thought Content:  Delusions  Suicidal Thoughts:  No  Homicidal Thoughts:  No  Memory:  Immediate;   Poor Recent;   Poor Remote;   Poor  Judgement:  Impaired  Insight:  Lacking  Psychomotor Activity:  Decreased  Concentration:  Concentration: Poor and Attention Span: Poor  Recall:  Poor  Fund of Knowledge:  Poor  Language:  Fair  Akathisia:  No  Handed:    AIMS (if indicated):     Assets:  Communication Skills Leisure Time  ADL's:  Intact  Cognition:  Impaired,  Mild  Sleep:  Number of Hours: 8     Treatment Plan Summary:  Slowly improving  Patient is a 41 year old Caucasian female with history of schizoaffective bipolar type. He presented to the emergency department due to aggression, agitation, and psychosis. Patient has not been compliant with medications.  For schizoaffective disorder: Patient appears much improved compared to last week. She actually talked to me today. She is still hallucinating. Nurses report she still very withdrawn and is seen interacting to internal stimuli. Patient says that she has taken Abilify and Trileptal in the past and she would like to restart those medications as she feels they work very well for her.  I will start her on Abilify  15 mg a day and Trileptal 300 mg twice a day.  Plan to titrate up Abilify and offer Abilify injectable  For now we'll discontinue non emergency forced medications  For agitation: continue Ativan 2 mg every 4 hours as needed  Patient require Manual Hold today as she refused to leave the office where I was interviewing her on 1/4.    Ordered multivitamins  Diet regular  Vital signs daily  Continue to push fluids. Ordered ensure tid  Precautions every 15 minute checks  Hospitalization status continue involuntary  commitment  Labs we will check TSH, hemoglobin A1c and lipid panel---has refused all labs  Collateral information will be obtained from the patient's mother  Chart review has been completed  Labs have been reviewed (pregnancy neg, no UTI.  Utox + for benzos, cbc, cmp wnl). We will try to get new labs this weekend including basic metabolic panel.     Jimmy Footman, MD 03/05/2016, 9:40 AM

## 2016-03-05 NOTE — [facility] Group Notes (Signed)
[facility] Group Notes:  (Nursing/MHT/Case Management/Adjunct)  Date:  03/05/2016  Time:  9:33 PM  Type of Therapy:  Psychoeducational Skills  Participation Level:  Did Not Attend  Participation QualitySummary of Progress/Problems:  Mayra NeerJackie L Sherard Sutch 03/05/2016, 9:33 PM

## 2016-03-05 NOTE — Progress Notes (Signed)
D: Pt denies SI/HI/VH, +ve AH- can't make them out . Pt is pleasant and cooperative at times. Pt stated she was willing to take what she needs , but " I'm sensitive right now, I want to take the Trileptal  and Abilify" . Pt isolated to her room this evening and pt took her medications PO .    A: Pt was offered support and encouragement. Pt was given scheduled medications. Pt was encourage to attend groups. Q 15 minute checks were done for safety.    R:Pt attends groups and interacts well with peers and staff. Pt is taking medication. Pt has no complaints.Pt receptive to treatment and safety maintained on unit.

## 2016-03-05 NOTE — [facility] Group Notes (Signed)
[facility] LCSW Group Therapy   03/05/2016  1 PM  Type of Therapy: Group Therapy   Participation Level: Did Not Attend. Patient invited to participate but declined.    Lakendria Nicastro F. Arita Severtson, MSW, LCSWA, LCAS     

## 2016-03-05 NOTE — Progress Notes (Signed)
D:  Per pt self inventory pt reports sleeping fair, appetite poor, energy level low, ability to pay attention poor, rates depression at a 6 out of 10, hopelessness at a 7 out of 10, anxiety at a 3 out of 10, denies SI/HI/AVH however patient is guarded, suspicious and appears to be responding to internal stimuli, goal today: "to relax and stay in the moment."     A:  Emotional support provided, Encouraged pt to continue with treatment plan and attend all group activities, q15 min checks maintained for safety.  R:  Pt is isolative in room and has not been going to groups, guarded/suspicious with staff and other patients on the unit, refused to take all of am Zyprexa dose, Dr. Ardyth HarpsHernandez made aware, MD to assess patient then advise.

## 2016-03-06 MED ORDER — ARIPIPRAZOLE 10 MG PO TABS
5.0000 mg | ORAL_TABLET | Freq: Once | ORAL | Status: AC
  Administered 2016-03-06: 5 mg via ORAL
  Filled 2016-03-06: qty 1

## 2016-03-06 MED ORDER — ARIPIPRAZOLE 10 MG PO TABS
20.0000 mg | ORAL_TABLET | Freq: Every day | ORAL | Status: DC
  Administered 2016-03-07: 20 mg via ORAL
  Filled 2016-03-06: qty 2

## 2016-03-06 NOTE — Progress Notes (Signed)
Patient rated her depression & anxiety 5/10.Denies suicidal or homicidal ideations.Patient states "I hear some voices but not that bad as you think."Patient looks on the floor when she stands, states she gets more concentration in that way.Isolated in the room.No interaction with staff & peers.Did not attend groups.Compliant with medications.Patient is not  receptive with a therapeutic communication with staff.

## 2016-03-06 NOTE — [facility] Group Notes (Signed)
Goals Group  Date/Time: 9:00 AM Type of Therapy and Topic: Group Therapy: Goals Group: SMART Goals  ?  Participation Level: Moderate  ?  Description of Group:  ?  The purpose of a daily goals group is to assist and guide patients in setting recovery/wellness-related goals. The objective is to set goals as they relate to the crisis in which they were admitted. Patients will be using SMART goal modalities to set measurable goals. Characteristics of realistic goals will be discussed and patients will be assisted in setting and processing how one will reach their goal. Facilitator will also assist patients in applying interventions and coping skills learned in psycho-education groups to the SMART goal and process how one will achieve defined goal.  ?  Therapeutic Goals:  ?  -Patients will develop and document one goal related to or their crisis in which brought them into treatment.  -Patients will be guided by LCSW using SMART goal setting modality in how to set a measurable, attainable, realistic and time sensitive goal.  -Patients will process barriers in reaching goal.  -Patients will process interventions in how to overcome and successful in reaching goal.  ?  Patient's Goal: Pt invited but did not attend. ?  Therapeutic Modalities:  Motivational Interviewing  Cognitive Behavioral Therapy  Crisis Intervention Model  SMART goals setting  Hampton AbbotKadijah Spiros Greenfeld, MSW, LCSW-A 03/06/2016, 10:41AM

## 2016-03-06 NOTE — [facility] Group Notes (Signed)
[facility] LCSW Group Therapy   03/06/2016  1 PM  Type of Therapy: Group Therapy   Participation Level: Did Not Attend. Patient invited to participate but declined.    Azai Gaffin F. Rowin Bayron, MSW, LCSWA, LCAS     

## 2016-03-06 NOTE — Progress Notes (Signed)
Spoke with pt's mother on 1/8. Patient follows up with Dr. Ailene [facility]avelJeffrey Maxwell at [facility]. She has a therapist Bevely PalmerMichelle Gallomore phone number [phone] or [phone]. Patient had her last visit with her psychiatrist in November. The mother says that the patient is only seen every 4 months. A shunt has been seeing a therapist about twice a month. The patient has been seen by multiple physicians. [place] recently and was hospitalized there. She was advised to move back to [place] with her mother. Her mother is 40 years old. The mother says that she doesn't know how to help her daughter. She has become not afraid of her. To the point that she created an escape plane. Patient has refused to take medications. Mother said she was hospitalized in May in [place] then went back to [facility] which she was in the emergency department but was discharged back. She was hospitalized then again in June and as stated until July. Then she became manic back in December.  Mother said patient has refused to apply for disability or Medicaid. The mother thinks is best for the patient to leave in a group home. Mother does not feel safe with the patient returning back home to live with her.  As far as medications mother feels that Abilify has worked the best  We discussed the possibility of guardianship. Mother was concerned as she thinks all her debt will be transferred to the guardian. This was clarified for the mother.  We plan to have another meeting over the phone on Wednesday at 2:00. Her phone number is [phone]. Mother was invited for a face-to-face meeting but she says she is unable to make it because she has different jobs in the area of [place] and is difficult for her to come to [place].  This info was discussed with pt's SW here.

## 2016-03-06 NOTE — Progress Notes (Signed)
[facility] MD Progress Note  03/06/2016 9:14 AM Jessica [name]  MRN:  [id number] Subjective:  Patient is a 40 year old Caucasian female with history of schizoaffective disorder bipolar type. She was brought in by police to [facility] emergency department on January 3rd. Per the emergency departments note patient's mother called EMS after the patient grabbed her arm and wouldn't let go. Upon EMS arrival the patient was standing aggressively in the doorway and saying "[place], [place]. There is too much evil in [place]". She was uncooperative with evaluation yelling occasionally. Nurses in the ER described her as agitated and refusing to dressed out in his scrubs tangential.  It has been reported patient was noncompliant with her medications.  Per chart review she was hospitalized twice last year in behavioral health [place]. Her last admission was in June 2017. She was discharged with a diagnosis of schizoaffective disorder bipolar type. During her stay in the hospital she was placed on known emergency forced medications.  1/5 patient refused all medications yesterday. She had a Manual hold on 1/4 after she refused to leave the office where she was being interviewed. Today she has been started unknown emergency forced medications. She refuses oral olanzapine earlier this morning but took it later on. Patient has been refusing to eat or drink anything. She has been in bed all day. She refused to speak with me during assessment today her only answer was "I have no comment". She refused to open her mouth to evaluate for dehydration.  1/8 patient was more cooperative and engaging assessment today. She tells me she's been diagnosed with bipolar disorder and was prescribed with Trileptal and Abilify. She wonders if she can take these medications again as they help her. Patient tells me she has slept well over the last 2 nights. She is not having homicidal or suicidal ideation. She has been having visual  hallucinations which she sees as periods she says she does not feel threatened or scared when she sees the spirit. As far as side effects that she felt little lightheaded this morning were no other side effects. Last week she was refusing to eat now she is saying she is feels her appetite is very low but she tried to eat some last night. She tells me she's been drinking ensures and Gatorades.   She has been compliant with most medications but has refused Depakote. This morning she only took 2.5 mg of olanzapine not the whole dose order which was 10 mg.    1/9 No much improvement. She refused to speak with me today she asked me to step out of the room. Did not answer any of my questions.  Oral intake is is still poor looks like she is having only one meal per day. She has been drinking ensures and gatorades. Vital signs are within the normal limits. She complied with medications yesterday after antipsychotic was changed from olanzapine to Abilify. Per nursing notes she slept 7 hours  Per nursing: D: Pt denies SI/HI/AVH, but noted responding to internal stimuli. Pt is unwilling to participate in treatment care. . Patient's thoughts are disorganized, appears hype religious and anxious and she is not interacting with peers and staff appropriately.  A: Pt was offered support and encouragement. Pt was given scheduled medications. Pt was encouraged to attend groups. Q 15 minute checks were done for safety.  R:Pt did not attend group.Pt is not receptive to treatment and safety maintained on unit.   Principal Problem: Schizoaffective disorder, bipolar type (HCC) Diagnosis:  Patient Active Problem List   Diagnosis Date Noted  . Schizoaffective disorder, bipolar type (HCC) [F25.0] 07/25/2015   Total Time spent with patient: 30 minutes  Past Psychiatric History: Patient has been hospitalized several times at behavioral health in [place]. She was just discharged in July 2017.  I don't have any  information regarding prior suicidal attempts or self-injurious behaviors   Past Medical History:  Past Medical History:  Diagnosis Date  . Anxiety   . Bipolar 1 disorder (HCC)   . Depression   . Schizophrenia (HCC)    History reviewed. No pertinent surgical history.  Family History:  Family History  Problem Relation Age of Onset  . Hypertension Mother   . Mental illness Father    Family Psychiatric  History: Unknown at this point as patient is uncooperative   Social History: Not much information is available at this time other than she lives with her mother in [place]. 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 Marital status: Single    Spouse name: N/A  . Number of children: N/A  . Years of education: N/A   Social History Main Topics  . Smoking status: Never Smoker  . Smokeless tobacco: Never Used  . Alcohol use No  . Drug use: No  . Sexual activity: No   Other Topics Concern  . None   Social History Narrative  . None   Additional Social History:    Pain Medications: none Prescriptions: none Over the Counter: none History of alcohol / drug use?: No history of alcohol / drug abuse Negative Consequences of Use: Personal relationships, Financial, Work / School Withdrawal Symptoms: Other (Comment)      Current Medications: Current Facility-Administered Medications  Medication Dose Route Frequency Provider Last Rate Last Dose  . acetaminophen (TYLENOL) tablet 650 mg  650 mg Oral Q6H PRN Jimmy Footman, MD      . alum & mag hydroxide-simeth (MAALOX/MYLANTA) 200-200-20 MG/5ML suspension 30 mL  30 mL Oral Q4H PRN Jimmy Footman, MD      . ARIPiprazole (ABILIFY) tablet 15 mg  15 mg Oral Daily Jimmy Footman, MD   15 mg at 03/06/16 0825  . feeding supplement (ENSURE ENLIVE) (ENSURE ENLIVE) liquid 237  mL  237 mL Oral TID BM Jimmy Footman, MD   237 mL at 03/05/16 2100  . LORazepam (ATIVAN) tablet 2 mg  2 mg Oral Q4H PRN Jimmy Footman, MD   2 mg at 03/05/16 0855  . magnesium hydroxide (MILK OF MAGNESIA) suspension 30 mL  30 mL Oral Daily PRN Jimmy Footman, MD      . multivitamin with minerals tablet 1 tablet  1 tablet Oral Daily Jimmy Footman, MD   1 tablet at 03/06/16 0825  . Oxcarbazepine (TRILEPTAL) tablet 300 mg  300 mg Oral BID Jimmy Footman, MD   300 mg at 03/06/16 0825    Lab Results:  No results found for this or any previous visit (from the past 48 hour(s)).  Blood Alcohol level:  Lab Results  Component Value Date   [facility] <5 02/29/2016   ETH <5 08/03/2015    Metabolic Disorder Labs: Lab Results  Component Value Date   HGBA1C 5.7 (H) 07/27/2015   MPG 117 07/27/2015   Lab Results  Component Value Date   PROLACTIN 20.7 07/27/2015   Lab  Results  Component Value Date   CHOL 161 07/27/2015   TRIG 79 07/27/2015   HDL 50 07/27/2015   CHOLHDL 3.2 07/27/2015   VLDL 16 07/27/2015   LDLCALC 95 07/27/2015    Physical Findings: AIMS:  , ,  ,  ,    CIWA:    COWS:  COWS Total Score: 1  Musculoskeletal: Strength & Muscle Tone: within normal limits Gait & Station: normal Patient leans: N/A  Psychiatric Specialty Exam: Physical Exam  Constitutional: She is oriented to person, place, and time. She appears well-developed and well-nourished.  HENT:  Head: Normocephalic and atraumatic.  Eyes: Conjunctivae and EOM are normal.  Neck: Normal range of motion.  Respiratory: Effort normal.  Musculoskeletal: Normal range of motion.  Neurological: She is alert and oriented to person, place, and time.    Review of Systems  Unable to perform ROS: Acuity of condition  Constitutional: Negative.   HENT: Negative.   Eyes: Negative.   Respiratory: Negative.   Cardiovascular: Negative.   Gastrointestinal: Negative.    Genitourinary: Negative.   Musculoskeletal: Negative.   Skin: Negative.   Neurological: Negative.   Endo/Heme/Allergies: Negative.   Psychiatric/Behavioral: Positive for hallucinations. Negative for depression, memory loss, substance abuse and suicidal ideas. The patient is not nervous/anxious and does not have insomnia.     Blood pressure 123/76, pulse 92, temperature 98.4 F (36.9 C), temperature source Oral, resp. rate 20, height 5\' 4"  (1.626 m), weight 90.7 kg (200 lb), last menstrual period 02/27/2016, SpO2 93 %.Body mass index is 34.33 kg/m.  General Appearance: Disheveled  Eye Contact:  Minimal  Speech:  Normal Rate  Volume:  Normal  Mood:  Irritable  Affect:  Constricted  Thought Process:  Disorganized and Descriptions of Associations: Loose  Orientation:  Full (Time, Place, and Person)  Thought Content:  Delusions  Suicidal Thoughts:  No  Homicidal Thoughts:  No  Memory:  Immediate;   Poor Recent;   Poor Remote;   Poor  Judgement:  Impaired  Insight:  Lacking  Psychomotor Activity:  Decreased  Concentration:  Concentration: Poor and Attention Span: Poor  Recall:  Poor  Fund of Knowledge:  Poor  Language:  Fair  Akathisia:  No  Handed:    AIMS (if indicated):     Assets:  Communication Skills Leisure Time  ADL's:  Intact  Cognition:  Impaired,  Mild  Sleep:  Number of Hours: 7.15     Treatment Plan Summary:  No improvement since admission. Basically nonverbal. Withdrawn to her room minimal oral intake  Patient is a 41 year old Caucasian female with history of schizoaffective bipolar type. He presented to the emergency department due to aggression, agitation, and psychosis. Patient has not been compliant with medications.  For schizoaffective disorder: Patient appears much improved compared to last week. She actually talked to me today. She is still hallucinating. Nurses report she still very withdrawn and is seen interacting to internal stimuli. Patient says  that she has taken Abilify and Trileptal in the past and she would like to restart those medications as she feels they work very well for her.  Patient has been is started on on Abilify and Trileptal 300 mg twice a day.  Plan to titrate up Abilify and offer Abilify injectable  Will increase abilify to 20 mg   For now we'll discontinue non emergency forced medications  For agitation: continue Ativan 2 mg every 4 hours as needed  Patient require Manual Hold on 1/4 as she refused to leave  the office where I was interviewing her on 1/4.   Ordered multivitamins  Diet regular  Vital signs daily  Continue to push fluids. Ordered ensure tid  Precautions every 15 minute checks  Hospitalization status continue involuntary commitment  Labs we will check TSH, hemoglobin A1c and lipid panel---has refused all labs  Collateral information will be obtained from the patient's mother  Chart review has been completed  Labs have been reviewed (pregnancy neg, no UTI.  Utox + for benzos, cbc, cmp wnl). We will try to get new labs this weekend including basic metabolic panel.   Spoke with pt's mother on 1/8. Patient follows up with Dr. Ailene Ravel at [facility]. She has a therapist Bevely Palmer phone number [phone] or [phone]. Patient had her last visit with her psychiatrist in November. The mother says that the patient is only seen every 4 months. A shunt has been seeing a therapist about twice a month. The patient has been seen by multiple physicians. Patient moved to [facility] recently and was hospitalized there. She was advised to move back to [place] with her mother. Her mother is 39 years old. The mother says that she doesn't know how to help her daughter. She has become not afraid of her. To the point that she created an escape plane. Patient has refused to take medications. Mother said she was hospitalized in May in [place] then went back to  [place] which she was in the emergency department but was discharged back. She was hospitalized then again in June and as stated until July. Then she became manic back in December.  Mother said patient has refused to apply for disability or Medicaid. The mother thinks is best for the patient to leave in a group home. Mother does not feel safe with the patient returning back home to live with her.  As far as medications mother feels that Abilify has worked the best  We discussed the possibility of guardianship. Mother was concerned as she thinks all her debt will be transferred to the guardian. This was clarified for the mother.  We plan to have another meeting over the phone on Wednesday at 2:00. Her phone number is [phone]. Mother was invited for a face-to-face meeting but she says she is unable to make it because she has different jobs in the area of [place] and is difficult for her to come to [place].  This info was discussed with pt's SW here.  Jimmy Footman, MD 03/06/2016, 9:14 AM

## 2016-03-06 NOTE — Progress Notes (Signed)
Recreation Therapy Notes  Date: 01.09.18 Time: 9:30 am Location: Craft Room  Group Topic: Goal Setting  Goal Area(s) Addresses:  Patient will write at least one goal. Patient will write at least one obstacle.  Behavioral Response: Did not attend  Intervention: Recovery Goal Chart  Activity: Patients were instructed to make a Recovery Goal Chart including goals, obstacles, the date they started working on their goals, and the date they achieved their goals.  Education: LRT educated patients on healthy ways they can celebrate reaching their goals.  Education Outcome: Patient did not attend group.   Clinical Observations/Feedback: Patient did not attend group.  Jacquelynn CreeGreene,Ezinne Yogi M, LRT/CTRS 03/06/2016 10:23 AM

## 2016-03-06 NOTE — Progress Notes (Signed)
D: Pt denies SI/HI/AVH, but noted responding to internal stimuli. Pt is unwilling to participate in treatment care. . Patient's thoughts are disorganized, appears hype religious and anxious and she is not interacting with peers and staff appropriately.  A: Pt was offered support and encouragement. Pt was given scheduled medications. Pt was encouraged to attend groups. Q 15 minute checks were done for safety.  R:Pt did not attend group.Pt is not receptive to treatment and safety maintained on unit.

## 2016-03-07 LAB — BASIC METABOLIC PANEL
Anion gap: 9 (ref 5–15)
BUN: 15 mg/dL (ref 6–20)
CO2: 29 mmol/L (ref 22–32)
CREATININE: 0.76 mg/dL (ref 0.44–1.00)
Calcium: 9.1 mg/dL (ref 8.9–10.3)
Chloride: 103 mmol/L (ref 101–111)
GFR calc non Af Amer: >60 mL/min (ref >60)
Glucose, Bld: 169 mg/dL — ABNORMAL HIGH (ref 65–99)
Potassium: 3.9 mmol/L (ref 3.5–5.1)
SODIUM: 141 mmol/L (ref 135–145)

## 2016-03-07 LAB — LIPID PANEL
Cholesterol: 165 mg/dL (ref 0–200)
HDL: 48 mg/dL (ref >40)
LDL CALC: 104 mg/dL — AB (ref 0–99)
Total CHOL/HDL Ratio: 3.4 RATIO
Triglycerides: 67 mg/dL (ref <150)
VLDL: 13 mg/dL (ref 0–40)

## 2016-03-07 LAB — TSH: TSH: 0.943 u[IU]/mL (ref 0.350–4.500)

## 2016-03-07 LAB — VITAMIN B12: VITAMIN B 12: 524 pg/mL (ref 180–914)

## 2016-03-07 MED ORDER — ARIPIPRAZOLE 10 MG PO TABS
30.0000 mg | ORAL_TABLET | Freq: Every day | ORAL | Status: DC
Start: 2016-03-08 — End: 2016-03-19
  Administered 2016-03-08: 20 mg via ORAL
  Administered 2016-03-09 – 2016-03-18 (×7): 30 mg via ORAL
  Filled 2016-03-07 (×11): qty 3

## 2016-03-07 MED ORDER — LORAZEPAM 2 MG PO TABS
2.0000 mg | ORAL_TABLET | Freq: Two times a day (BID) | ORAL | Status: DC
  Administered 2016-03-07 – 2016-03-09 (×3): 2 mg via ORAL
  Filled 2016-03-07 (×5): qty 1

## 2016-03-07 NOTE — [facility] Group Notes (Signed)
[facility] LCSW Group Therapy   03/07/2016  1:00 pm   Type of Therapy: Group Therapy   Participation Level: Pt invited but did not attend.  Participation Quality: Pt invited but did not attend.  Summary of Progress/Problems: The topic for group today was emotional regulation. This group focused on both positive and negative emotion identification and allowed  group members to process ways to identify feelings, regulate negative emotions, and find healthy ways to manage internal/external emotions. Group members were asked to reflect on a time when their reaction to an emotion led to a negative outcome and explored how alternative responses using emotion regulation would have benefited them. Group members were also asked to discuss a time when emotion regulation was utilized when a negative emotion was experienced.     Hampton AbbotKadijah Whittany Parish, MSW, LCSWA 03/07/2016, 2:17PM

## 2016-03-07 NOTE — Progress Notes (Signed)
[facility] MD Progress Note  03/07/2016 1:30 PM Jessica [name]  MRN:  [id number] Subjective:  Patient is a 41 year old Caucasian female with history of schizoaffective disorder bipolar type. She was brought in by police to [facility] emergency department on January 3rd. Per the emergency departments note patient's mother called EMS after the patient grabbed her arm and wouldn't let go. Upon EMS arrival the patient was standing aggressively in the doorway and saying "[place], [place]. There is too much evil in [place]". She was uncooperative with evaluation yelling occasionally. Nurses in the ER described her as agitated and refusing to dressed out in his scrubs tangential.  It has been reported patient was noncompliant with her medications.  Per chart review she was hospitalized twice last year in behavioral health [place]. Her last admission was in June 2017. She was discharged with a diagnosis of schizoaffective disorder bipolar type. During her stay in the hospital she was placed on known emergency forced medications.  1/5 patient refused all medications yesterday. She had a Manual hold on 1/4 after she refused to leave the office where she was being interviewed. Today she has been started unknown emergency forced medications. She refuses oral olanzapine earlier this morning but took it later on. Patient has been refusing to eat or drink anything. She has been in bed all day. She refused to speak with me during assessment today her only answer was "I have no comment". She refused to open her mouth to evaluate for dehydration.  1/8 patient was more cooperative and engaging assessment today. She tells me she's been diagnosed with bipolar disorder and was prescribed with Trileptal and Abilify. She wonders if she can take these medications again as they help her. Patient tells me she has slept well over the last 2 nights. She is not having homicidal or suicidal ideation. She has been having visual  hallucinations which she sees as periods she says she does not feel threatened or scared when she sees the spirit. As far as side effects that she felt little lightheaded this morning were no other side effects. Last week she was refusing to eat now she is saying she is feels her appetite is very low but she tried to eat some last night. She tells me she's been drinking ensures and Gatorades.   She has been compliant with most medications but has refused Depakote. This morning she only took 2.5 mg of olanzapine not the whole dose order which was 10 mg.    1/9 No much improvement. She refused to speak with me today she asked me to step out of the room. Did not answer any of my questions.  Oral intake is is still poor looks like she is having only one meal per day. She has been drinking ensures and gatorades. Vital signs are within the normal limits. She complied with medications yesterday after antipsychotic was changed from olanzapine to Abilify. Per nursing notes she slept 7 hours  1/10 patient has been uncooperative for the last 2 days. She is always lying in bed covered with blankets. She does not talk to me or open her eyes. She told me yesterday to step out of her room. Today she told me she did not wanted to talk and wanted to end the conversation. Per nursing staff she is eating 1 meal a day, about 50% of that meeting is what she actually eats. She has been drinking fluids. She is compliant with medications. She is allowing vital signs and they have  been a stable. I will order labs today.  Per nursing: Drinking ensure and gatorade  Isolative, uninterested, dismissive, answered to short questions, "No Sir", denied pain, denied SI/HI. Stayed in bed through out the shift. Gatorade provided to patient per request.   Principal Problem: Schizoaffective disorder, bipolar type (HCC) Diagnosis:   Patient Active Problem List   Diagnosis Date Noted  . Schizoaffective disorder, bipolar type (HCC)  [F25.0] 07/25/2015   Total Time spent with patient: 30 minutes  Past Psychiatric History: Patient has been hospitalized several times at behavioral health in [place]. She was just discharged in July 2017.  I don't have any information regarding prior suicidal attempts or self-injurious behaviors   Past Medical History:  Past Medical History:  Diagnosis Date  . Anxiety   . Bipolar 1 disorder (HCC)   . Depression   . Schizophrenia (HCC)    History reviewed. No pertinent surgical history.  Family History:  Family History  Problem Relation Age of Onset  . Hypertension Mother   . Mental illness Father    Family Psychiatric  History: Unknown at this point as patient is uncooperative   Social History: Not much information is available at this time other than she lives with her mother in [place]. 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 Marital status: Single    Spouse name: N/A  . Number of children: N/A  . Years of education: N/A   Social History Main Topics  . Smoking status: Never Smoker  . Smokeless tobacco: Never Used  . Alcohol use No  . Drug use: No  . Sexual activity: No   Other Topics Concern  . None   Social History Narrative  . None   Additional Social History:    Pain Medications: none Prescriptions: none Over the Counter: none History of alcohol / drug use?: No history of alcohol / drug abuse Negative Consequences of Use: Personal relationships, Financial, Work / School Withdrawal Symptoms: Other (Comment)      Current Medications: Current Facility-Administered Medications  Medication Dose Route Frequency Provider Last Rate Last Dose  . acetaminophen (TYLENOL) tablet 650 mg  650 mg Oral Q6H PRN Jimmy Footman, MD      . alum & mag hydroxide-simeth (MAALOX/MYLANTA) 200-200-20 MG/5ML suspension 30 mL   30 mL Oral Q4H PRN Jimmy Footman, MD      . Melene Muller ON 03/08/2016] ARIPiprazole (ABILIFY) tablet 30 mg  30 mg Oral Daily Jimmy Footman, MD      . feeding supplement (ENSURE ENLIVE) (ENSURE ENLIVE) liquid 237 mL  237 mL Oral TID BM Jimmy Footman, MD   237 mL at 03/07/16 1000  . LORazepam (ATIVAN) tablet 2 mg  2 mg Oral BID Jimmy Footman, MD   2 mg at 03/07/16 1254  . magnesium hydroxide (MILK OF MAGNESIA) suspension 30 mL  30 mL Oral Daily PRN Jimmy Footman, MD      . multivitamin with minerals tablet 1 tablet  1 tablet Oral Daily Jimmy Footman, MD   1 tablet at 03/07/16 [phone]  . Oxcarbazepine (TRILEPTAL) tablet 300 mg  300 mg Oral BID Jimmy Footman, MD   300 mg at 03/07/16 5409    Lab Results:  No results found for this or any previous visit (from the past 48 hour(s)).  Blood Alcohol level:  Lab Results  Component Value Date   [facility] <5 02/29/2016   ETH <5 08/03/2015    Metabolic Disorder Labs: Lab Results  Component Value Date   HGBA1C 5.7 (H) 07/27/2015   MPG 117 07/27/2015   Lab Results  Component Value Date   PROLACTIN 20.7 07/27/2015   Lab Results  Component Value Date   CHOL 161 07/27/2015   TRIG 79 07/27/2015   HDL 50 07/27/2015   CHOLHDL 3.2 07/27/2015   VLDL 16 07/27/2015   LDLCALC 95 07/27/2015    Physical Findings: AIMS:  , ,  ,  ,    CIWA:    COWS:  COWS Total Score: 1  Musculoskeletal: Strength & Muscle Tone: within normal limits Gait & Station: normal Patient leans: N/A  Psychiatric Specialty Exam: Physical Exam  Constitutional: She is oriented to person, place, and time. She appears well-developed and well-nourished.  HENT:  Head: Normocephalic and atraumatic.  Eyes: Conjunctivae and EOM are normal.  Neck: Normal range of motion.  Respiratory: Effort normal.  Musculoskeletal: Normal range of motion.  Neurological: She is alert and oriented to person, place, and time.     Review of Systems  Unable to perform ROS: Acuity of condition    Blood pressure 124/79, pulse 93, temperature 98.8 F (37.1 C), temperature source Oral, resp. rate 20, height 5\' 4"  (1.626 m), weight 90.7 kg (200 lb), last menstrual period 02/27/2016, SpO2 93 %.Body mass index is 34.33 kg/m.  General Appearance: Disheveled  Eye Contact:  Minimal  Speech:  Normal Rate  Volume:  Normal  Mood:  Irritable  Affect:  Constricted  Thought Process:  Disorganized and Descriptions of Associations: Loose  Orientation:  Full (Time, Place, and Person)  Thought Content:  Delusions  Suicidal Thoughts:  No  Homicidal Thoughts:  No  Memory:  Immediate;   Poor Recent;   Poor Remote;   Poor  Judgement:  Impaired  Insight:  Lacking  Psychomotor Activity:  Decreased  Concentration:  Concentration: Poor and Attention Span: Poor  Recall:  Poor  Fund of Knowledge:  Poor  Language:  Fair  Akathisia:  No  Handed:    AIMS (if indicated):     Assets:  Communication Skills Leisure Time  ADL's:  Intact  Cognition:  Impaired,  Mild  Sleep:  Number of Hours: 8.45     Treatment Plan Summary:  No improvement since admission. Basically nonverbal. Withdrawn to her room minimal oral intake. Only eating one meal a day and Ativan in she is only eating about 50%. She has been drinking ensures per nursing staff.  Patient is a 41 year old Caucasian female with history of schizoaffective bipolar type. He presented to the emergency department due to aggression, agitation, and psychosis. Patient has not been compliant with medications.  For schizoaffective disorder: Patient has been lying in bed all day. She is not attending any programming. She is refusing to speak to me. She refused to speak with me and the social worker yesterday and then again today. She has been invited to participate in treatment team twice but she has declined.  Patient has been is started on on Abilify and Trileptal 300 mg twice a day.   Plan to titrate up Abilify and offer Abilify injectable  Will increase abilify to 30 mg   For now will discontinue non emergency forced medications as She has been compliant with Abilify and Trileptal  For possible catatonia patient will be started on Ativan 2 mg twice a day.  For agitation: continue Ativan  2 mg every 4 hours as needed  Patient require Manual Hold on 1/4 as she refused to leave the office where I was interviewing her on 1/4.   Ordered multivitamins  Diet regular  Vital signs daily  Continue to push fluids. Ordered ensure tid  Precautions every 15 minute checks  Hospitalization status continue involuntary commitment  Labs we will check TSH, hemoglobin A1c and lipid panel---has refused all labs  Collateral information has been  obtained from the patient's mother. Patient has declined for us to have any contact with the mother however at this point in time she does not have capacity to make that decision. She is very psychotic. The mother will be contacted again today. We plan to discuss with her the need of guardianship in this case. This patient has refused to apply for Medicaid or disability. She isn't paranoid and unable to work. Mother feels very unsafe with her around the house as patient is noncompliant with medication. Patient displayed aggression towards the family prior to admission. They do not plan for her would be to have potentially group home placement, and act team and a guardian and I will agree for a long acting injectable.  Labs have been reviewed (pregnancy neg, no UTI.  Utox + for benzos, cbc, cmp wnl). We will try to get new labs today including basic metabolic panel.    Jimmy FootmanHernandez-Gonzalez,  Edison Wollschlager, MD 03/07/2016, 1:30 PM

## 2016-03-07 NOTE — Plan of Care (Signed)
Problem: Activity: Goal: Sleeping patterns will improve Outcome: Progressing Patient slept for Estimated Hours of 8.45; safety maintained, no injury or falls during this shift.    

## 2016-03-07 NOTE — Plan of Care (Signed)
Problem: Nutritional: Goal: Ability to achieve adequate nutritional intake will improve Outcome: Progressing Drinking ensure and gatorade

## 2016-03-07 NOTE — Progress Notes (Signed)
Recreation Therapy Notes  Date: 01.10.19 Time: 9:30 am Location: Craft Room  Group Topic: Self-esteem  Goal Area(s) Addresses:  Patient will write at least one positive trait about self. Patient will verbalize benefit of having a healthy self-esteem.  Behavioral Response: Did not attend  Intervention: I Am  Activity: Patients were given a worksheet with the letter I on it and were instructed to write as many positive traits about themselves inside the letter.  Education: LRT educated patients on ways to increase their self-esteem.  Education Outcome: Patient did not attend group.   Clinical Observations/Feedback: Patient did not attend group.  Jacquelynn CreeGreene,Dionisia Pacholski M, LRT/CTRS 03/07/2016 10:15 AM

## 2016-03-07 NOTE — Progress Notes (Signed)
Patient isolates to room.  Avoids eye contact.  Not forthcoming with information will answer yes and no questions.  Will not enter medication room unless invited in.  Shows some signs of paranoia, patient has read packaging on all medications and medications have to be opened in front of her.  Appetite poor.  Did not eat breakfast or lunch.  States "I drank an ensure that was my lunch."  Ate dinner.

## 2016-03-07 NOTE — Tx Team (Signed)
Interdisciplinary Treatment and Diagnostic Plan Update  03/07/2016 Time of Session: 10:30 AM Jessica ChaletJennifer L [name] MRN: [id number]  Principal Diagnosis: Schizoaffective disorder, bipolar type [facility](HCC)  Secondary Diagnoses: Principal Problem:   Schizoaffective disorder, bipolar type (HCC)   Current Medications:  Current Facility-Administered Medications  Medication Dose Route Frequency Provider Last Rate Last Dose  . acetaminophen (TYLENOL) tablet 650 mg  650 mg Oral Q6H PRN Jimmy FootmanAndrea Hernandez-Gonzalez, MD      . alum & mag hydroxide-simeth (MAALOX/MYLANTA) 200-200-20 MG/5ML suspension 30 mL  30 mL Oral Q4H PRN Jimmy FootmanAndrea Hernandez-Gonzalez, MD      . ARIPiprazole (ABILIFY) tablet 20 mg  20 mg Oral Daily Jimmy FootmanAndrea Hernandez-Gonzalez, MD   20 mg at 03/07/16 [id number]  . feeding supplement (ENSURE ENLIVE) (ENSURE ENLIVE) liquid 237 mL  237 mL Oral TID BM Jimmy FootmanAndrea Hernandez-Gonzalez, MD   237 mL at 03/07/16 1000  . LORazepam (ATIVAN) tablet 2 mg  2 mg Oral Q4H PRN Jimmy FootmanAndrea Hernandez-Gonzalez, MD   2 mg at 03/05/16 0855  . magnesium hydroxide (MILK OF MAGNESIA) suspension 30 mL  30 mL Oral Daily PRN Jimmy FootmanAndrea Hernandez-Gonzalez, MD      . multivitamin with minerals tablet 1 tablet  1 tablet Oral Daily Jimmy FootmanAndrea Hernandez-Gonzalez, MD   1 tablet at 03/07/16 [phone]  . Oxcarbazepine (TRILEPTAL) tablet 300 mg  300 mg Oral BID Jimmy FootmanAndrea Hernandez-Gonzalez, MD   300 mg at 03/07/16 [id number]   PTA Medications: Prescriptions Prior to Admission  Medication Sig Dispense Refill Last Dose  . ARIPiprazole (ABILIFY) 5 MG tablet Take 5 mg by mouth daily.   02/29/2016 at Unknown time  . doxepin (SINEQUAN) 10 MG capsule Take 2 capsules (20 mg total) by mouth at bedtime as needed (sleep). 30 capsule 0 unknown  . hydrOXYzine (ATARAX/VISTARIL) 25 MG tablet Take 1 tablet (25 mg total) by mouth every 6 (six) hours as needed for anxiety. 30 tablet 0 unknown  . OLANZapine (ZYPREXA) 2.5 MG tablet Take 5 tablets (12.5 mg total) by mouth at bedtime. (Patient  not taking: Reported on 02/29/2016) 30 tablet 0 Not Taking at Unknown time  . OXcarbazepine (TRILEPTAL) 150 MG tablet Take 1 tablet (150 mg total) by mouth 2 (two) times daily. 60 tablet 0 02/29/2016 at Unknown time  . ziprasidone (GEODON) 20 MG capsule Take 1 capsule (20 mg total) by mouth 2 (two) times daily as needed (SEVERE ANXIETY/AGITATION). 30 capsule 0 unknown    Patient Stressors: Financial difficulties Marital or family conflict Medication change or noncompliance  Patient Strengths: Ability for Warden/rangerinsight Communication skills Motivation for treatment/growth  Treatment Modalities: Medication Management, Group therapy, Case management,  1 to 1 session with clinician, Psychoeducation, Recreational therapy.   Physician Treatment Plan for Primary Diagnosis: Schizoaffective disorder, bipolar type (HCC) Long Term Goal(s): Improvement in symptoms so as ready for discharge Improvement in symptoms so as ready for discharge   Short Term Goals: Ability to identify changes in lifestyle to reduce recurrence of condition will improve Ability to demonstrate self-control will improve Ability to identify and develop effective coping behaviors will improve Compliance with prescribed medications will improve Ability to identify triggers associated with substance abuse/mental health issues will improve Ability to identify changes in lifestyle to reduce recurrence of condition will improve Ability to verbalize feelings will improve Ability to demonstrate self-control will improve Compliance with prescribed medications will improve Ability to identify triggers associated with substance abuse/mental health issues will improve  Medication Management: Evaluate patient's response, side effects, and tolerance of medication regimen.  Therapeutic Interventions: 1  to 1 sessions, Unit Group sessions and Medication administration.  Evaluation of Outcomes: Progressing  Physician Treatment Plan for Secondary  Diagnosis: Principal Problem:   Schizoaffective disorder, bipolar type (HCC)  Long Term Goal(s): Improvement in symptoms so as ready for discharge Improvement in symptoms so as ready for discharge   Short Term Goals: Ability to identify changes in lifestyle to reduce recurrence of condition will improve Ability to demonstrate self-control will improve Ability to identify and develop effective coping behaviors will improve Compliance with prescribed medications will improve Ability to identify triggers associated with substance abuse/mental health issues will improve Ability to identify changes in lifestyle to reduce recurrence of condition will improve Ability to verbalize feelings will improve Ability to demonstrate self-control will improve Compliance with prescribed medications will improve Ability to identify triggers associated with substance abuse/mental health issues will improve     Medication Management: Evaluate patient's response, side effects, and tolerance of medication regimen.  Therapeutic Interventions: 1 to 1 sessions, Unit Group sessions and Medication administration.  Evaluation of Outcomes: Progressing   RN Treatment Plan for Primary Diagnosis: Schizoaffective disorder, bipolar type (HCC) Long Term Goal(s): Knowledge of disease  Short Term Goals: Ability to remain free from injury will improve, Ability to demonstrate self-control, Ability to disclose and discuss suicidal ideas and Compliance with prescribed medications will improve  Medication Management: RN will administer medications as ordered by provider, will assess and evaluate patient's response and provide education to patient for prescribed medication. RN will report any adverse and/or side effects to prescribing provider.  Therapeutic Interventions: 1 on 1 counseling sessions, Psychoeducation, Medication administration, Evaluate responses to treatment, Monitor vital signs and CBGs as ordered,  Perform/monitor CIWA, COWS, AIMS and Fall Risk screenings as ordered, Perform wound care treatments as ordered.  Evaluation of Outcomes: Progressing   LCSW Treatment Plan for Primary Diagnosis: Schizoaffective disorder, bipolar type (HCC) Long Term Goal(s): Safe transition to appropriate next level of care at discharge, Engage patient in therapeutic group addressing interpersonal concerns.  Short Term Goals: Engage patient in aftercare planning with referrals and resources, Increase social support, Facilitate acceptance of mental health diagnosis and concerns and Increase skills for wellness and recovery  Therapeutic Interventions: Assess for all discharge needs, 1 to 1 time with Social worker, Explore available resources and support systems, Assess for adequacy in community support network, Educate family and significant other(s) on suicide prevention, Complete Psychosocial Assessment, Interpersonal group therapy.  Evaluation of Outcomes: Progressing   Progress in Treatment: Attending groups: No. Participating in groups: No. Taking medication as prescribed: Yes. Toleration medication: Yes. Family/Significant other contact made: No, will contact:  CSW assessing proper contacts. Patient understands diagnosis: Yes. Discussing patient identified problems/goals with staff: Yes. Medical problems stabilized or resolved: Yes. Denies suicidal/homicidal ideation: Yes. Issues/concerns per patient self-inventory: No.  New problem(s) identified: No, Describe:  None identified.   Reason for Continuation of Hospitalization: Delusions  Hallucinations  Estimated Length of Stay: 7 days  Attendees: Patient: Jessica [name] 03/07/2016 11:15 AM  Physician: Dr. Radene Journey, MD  03/07/2016 11:15 AM  Nursing: Hulan Amato, RN 03/07/2016 11:15 AM  RN Care Manager: 03/07/2016 11:15 AM  Social Worker: Hampton Abbot, MSW, LCSW-A 03/07/2016 11:15 AM  Recreational Therapist: Hershal Coria, LRT, CTRS   03/07/2016 11:15 AM   Scribe for Treatment Team: Lynden Oxford, LCSWA 03/07/2016 11:15 AM

## 2016-03-07 NOTE — Plan of Care (Signed)
Problem: Self-Care: Goal: Ability to participate in self-care as condition permits will improve Outcome: Progressing Maintains personal care chores appropriately.     

## 2016-03-07 NOTE — Progress Notes (Signed)
Patient ID: Jessica [name], female   DOB: 06/15/1975, 41 y.o.   MRN: [id number] Isolative, uninterested, dismissive, answered to short questions, "No Sir", denied pain, denied SI/HI. Stayed in bed through out the shift. Gatorade provided to patient per request.

## 2016-03-08 LAB — HEMOGLOBIN A1C
Hgb A1c MFr Bld: 5.4 % (ref 4.8–5.6)
MEAN PLASMA GLUCOSE: 108 mg/dL

## 2016-03-08 MED ORDER — OLANZAPINE 10 MG IM SOLR
10.0000 mg | Freq: Every day | INTRAMUSCULAR | Status: DC
Start: 2016-03-09 — End: 2016-03-19
  Filled 2016-03-08: qty 10

## 2016-03-08 MED ORDER — ARIPIPRAZOLE 10 MG PO TABS
10.0000 mg | ORAL_TABLET | Freq: Once | ORAL | Status: AC
  Administered 2016-03-08: 10 mg via ORAL

## 2016-03-08 MED ORDER — OLANZAPINE 10 MG IM SOLR: 10.0000 mg | Freq: Once | INTRAMUSCULAR | Status: DC | PRN

## 2016-03-08 MED ORDER — ARIPIPRAZOLE 10 MG PO TABS
30.0000 mg | ORAL_TABLET | Freq: Every day | ORAL | Status: DC
  Administered 2016-03-10 – 2016-03-19 (×7): 30 mg via ORAL
  Filled 2016-03-08 (×5): qty 3

## 2016-03-08 MED ORDER — LORAZEPAM 2 MG PO TABS
2.0000 mg | ORAL_TABLET | Freq: Once | ORAL | Status: AC
  Administered 2016-03-08: 2 mg via ORAL

## 2016-03-08 NOTE — Progress Notes (Signed)
Recreation Therapy Notes  Date: 01.11.18 Time: 9:30 am Location: Craft Room  Group Topic: Leisure Education  Goal Area(s) Addresses:  Patient will identify activities for each letter of the alphabet. Patient will verbalize ability to integrate positive leisure into life post d/c. Patient will verbalize ability to use leisure as a coping skill.  Behavioral Response: Did not attend  Intervention: Leisure Alphabet  Activity: Patients were given a Leisure Alphabet worksheet and were instructed to write healthy leisure activities for each letter of the alphabet.  Education: LRT educated patients on what they need to participate in leisure.  Education Outcome: Patient did not attend group.   Clinical Observations/Feedback: Patient did not attend group.  Wes Lezotte M, LRT/CTRS 03/08/2016 10:05 AM 

## 2016-03-08 NOTE — Plan of Care (Signed)
Problem: Nutritional: Goal: Ability to achieve adequate nutritional intake will improve Outcome: Progressing Eating 1 meal per day. Drinking ensures and gatorade

## 2016-03-08 NOTE — [facility] Group Notes (Signed)
[facility] Group Notes:  (Nursing/MHT/Case Management/Adjunct)  Date:  03/08/2016  Time:  3:49 PM  Type of Therapy:  Psychoeducational Skills  Participation Level:  Did Not Attend  Jessica [name] 03/08/2016, 3:49 PM 

## 2016-03-08 NOTE — Plan of Care (Signed)
Problem: Role Relationship: Goal: Ability to communicate needs accurately will improve Outcome: Progressing Patient interacting with peers in the dayroom.

## 2016-03-08 NOTE — Progress Notes (Signed)
March 08, 2016.  Patient Identification: Jessica [name] MRN:  [id number] Date of Evaluation:  03/01/2016 Chief Complaint:  psychosis Principal Diagnosis: Schizoaffective disorder, bipolar type [facility](HCC)  [facility]  Patient is a 41 year old Caucasian female with history of schizoaffective disorder bipolar type. She was brought in by police [facility] on January 3rd. Per the emergency departments notes patient's mother called EMS after the patient grabbed her arm and wouldn't let go. Upon EMS arrival the patient was standing aggressively in the doorway and saying "[place]. There is too much evil [place]". She was uncooperative with evaluation yelling occasionally. Nurses in the ER described her as agitated.   It has been reported patient was noncompliant with her medications.  Per chart review she was hospitalized twice last year in behavioral health [place]. Her last admission was in June 2017. She was discharged with a diagnosis of schizoaffective disorder bipolar type. During her stay in the hospital she was placed on known emergency forced medications. Patient also was hospitalized in [place] in the summer of 2017.  Collateral information has been  obtained from the patient's mother. Patient has declined for us to have any contact with the mother however at this point in time she does not have capacity to make that decision.  This patient has refused to apply for Medicaid or disability as she does now believe she suffers from a mental illness. She is paranoid and unable to work. Mother feels very unsafe with her around the house as patient is noncompliant with medication. Patient displayed aggression towards the family prior to admission. Mother reports significant frustration as she has not been able to obtain help for her daughter.  The patient has been in our unit for 7 days. She has required nonemergency forced medications and have one  restrain. She has refused to eat or drink. In initially refused any lab work. She does not feel she needs medications. Patient has been uncooperative, she refuses to speak with physician or any other staff members. She secluded to her room. Stays in bed all day. In my opinion this patient does not have medical capacity to understand the need for psychiatric treatment. She does not acknowledge she suffers from a severe mental illness. She has been noncompliant with medications and has been unable to function in the community. Patient has displayed unsafe behaviors and aggression prior to admission. She has been hospitalized several times over the last 12 months. She has lost her job and is facing being homeless.  I think is in the best interests of this patient for her mother to become her legal guardian. Patient needs an advocate that will make decisions that are in her best interest. Without a guardian the cycle of hospitalizations with quick decompensation will continue. The patient's mother has been very involved in the treatment and has spoken with me and with the patient's social worker in several locations. In more information is needed about this case, please do not hesitate to contact me at [phone].   Sincerely,  Radene JourneyAndrea Hernandez M.D. [phone] [facility]/Behavioral health Unit

## 2016-03-08 NOTE — Progress Notes (Signed)
Patient stays to herself.  More visible in the milieu although no interaction with peers noted.  Appetite improving.  First am refused to take and ativan and would only take 20 mg of abilify.  Dr. Ardyth HarpsHernandez informed.  1 time order for 10 mg of abilify and 2 mg ativan given and patient took medications without difficulty.  Support and encouragement offered.  Safety maintained.

## 2016-03-08 NOTE — Progress Notes (Signed)
Patient ID: Jessica [name], female   DOB: 12/22/1975, 41 y.o.   MRN: [id number]  CS[facility]. CSW spoke with Jessica [name] from [facility]. Patient has been added to the wait list.  Brittnee Gaetano [name] MSW, [facility] 03/08/2016 2:44 PM

## 2016-03-08 NOTE — [facility] Group Notes (Signed)
[facility] LCSW Group Therapy Note  Date/Time: 03/08/16 1300  Type of Therapy/Topic:  Group Therapy:  Balance in Life  Participation Level:  Pt was invited but did not attend group.  Description of Group:    This group will address the concept of balance and how it feels and looks when one is unbalanced. Patients will be encouraged to process areas in their lives that are out of balance, and identify reasons for remaining unbalanced. Facilitators will guide patients utilizing problem- solving interventions to address and correct the stressor making their life unbalanced. Understanding and applying boundaries will be explored and addressed for obtaining  and maintaining a balanced life. Patients will be encouraged to explore ways to assertively make their unbalanced needs known to significant others in their lives, using other group members and facilitator for support and feedback.  Therapeutic Goals: 1. Patient will identify two or more emotions or situations they have that consume much of in their lives. 2. Patient will identify signs/triggers that life has become out of balance:  3. Patient will identify two ways to set boundaries in order to achieve balance in their lives:  4. Patient will demonstrate ability to communicate their needs through discussion and/or role plays  Summary of Patient Progress:    Therapeutic Modalities:   Cognitive Behavioral Therapy Solution-Focused Therapy Assertiveness Training  Greg Damonique Brunelle, LCSW 

## 2016-03-08 NOTE — Progress Notes (Signed)
[facility] Second Phys[facility] Opinion Progress Note for Medication Administration to Non-consenting Patients (For Involuntarily Committed Patients)  Patient: Jessica [name] Date of Birth: [id number] MRN: [id number]  Reason for the Medication: The patient, without the benefit of the specific treatment measure, is incapable of participating in any available treatment plan that will give the patient a realistic opportunity of improving the patient's condition. There is, without the benefit of the specific treatment measure, a significant possibility that the patient will harm self or others before improvement of the patient's condition is realized.  Consideration of Side Effects: Consideration of the side effects related to the medication plan has been given.  Rationale for Medication Administration: psychotic patient refusing medications.    Kristine LineaJolanta Pucilowska, MD 03/08/16  1:07 PM   This documentation is good for (7) seven days from the date of the MD signature. New documentation must be completed every seven (7) days with detailed justification in the medical record if the patient requires continued non-emergent administration of psychotropic medications.

## 2016-03-08 NOTE — [facility] Group Notes (Signed)
Goals Group Date/Time: 03/08/2016 9:00 AM Type of Therapy and Topic: Group Therapy: Goals Group: SMART Goals   Participation Level: Pt did not attend.  Description of Group:    The purpose of a daily goals group is to assist and guide patients in setting recovery/wellness-related goals. The objective is to set goals as they relate to the crisis in which they were admitted. Patients will be using SMART goal modalities to set measurable goals. Characteristics of realistic goals will be discussed and patients will be assisted in setting and processing how one will reach their goal. Facilitator will also assist patients in applying interventions and coping skills learned in psycho-education groups to the SMART goal and process how one will achieve defined goal.   Therapeutic Goals:   -Patients will develop and document one goal related to or their crisis in which brought them into treatment.  -Patients will be guided by LCSW using SMART goal setting modality in how to set a measurable, attainable, realistic and time sensitive goal.  -Patients will process barriers in reaching goal.  -Patients will process interventions in how to overcome and successful in reaching goal.   Patient's Goal:   Therapeutic Modalities:  Motivational Interviewing  Cognitive Behavioral Therapy  Crisis Intervention Model  SMART goals setting  Greg Lailynn [place], LCSW    

## 2016-03-08 NOTE — Progress Notes (Signed)
[facility] MD Progress Note  03/08/2016 9:47 AM Jessica [name]  MRN:  [id number] Subjective:  Patient is a 41 year old Caucasian female with history of schizoaffective disorder bipolar type. She was brought in by police to [facility] emergency department on January 3rd. Per the emergency departments note patient's mother called EMS after the patient grabbed her arm and wouldn't let go. Upon EMS arrival the patient was standing aggressively in the doorway and saying "[name], [name]. There is too much evil in [place]". She was uncooperative with evaluation yelling occasionally. Nurses in the ER described her as agitated and refusing to dressed out in his scrubs tangential.  It has been reported patient was noncompliant with her medications.  Per chart review she was hospitalized twice last year in behavioral health [place]. Her last admission was in June 2017. She was discharged with a diagnosis of schizoaffective disorder bipolar type. During her stay in the hospital she was placed on known emergency forced medications.  1/5 patient refused all medications yesterday. She had a Manual hold on 1/4 after she refused to leave the office where she was being interviewed. Today she has been started unknown emergency forced medications. She refuses oral olanzapine earlier this morning but took it later on. Patient has been refusing to eat or drink anything. She has been in bed all day. She refused to speak with me during assessment today her only answer was "I have no comment". She refused to open her mouth to evaluate for dehydration.  1/8 patient was more cooperative and engaging assessment today. She tells me she's been diagnosed with bipolar disorder and was prescribed with Trileptal and Abilify. She wonders if she can take these medications again as they help her. Patient tells me she has slept well over the last 2 nights. She is not having homicidal or suicidal ideation. She has been having visual  hallucinations which she sees as periods she says she does not feel threatened or scared when she sees the spirit. As far as side effects that she felt little lightheaded this morning were no other side effects. Last week she was refusing to eat now she is saying she is feels her appetite is very low but she tried to eat some last night. She tells me she's been drinking ensures and Gatorades.   She has been compliant with most medications but has refused Depakote. This morning she only took 2.5 mg of olanzapine not the whole dose order which was 10 mg.    1/9 No much improvement. She refused to speak with me today she asked me to step out of the room. Did not answer any of my questions.  Oral intake is is still poor looks like she is having only one meal per day. She has been drinking ensures and gatorades. Vital signs are within the normal limits. She complied with medications yesterday after antipsychotic was changed from olanzapine to Abilify. Per nursing notes she slept 7 hours  1/10 patient has been compliant with Abilify and Trileptal. Yesterday she took 2 doses of Ativan 2 mg which appeared effective. Patient was seen in the evening in the dayroom having dinner. He was documented the patient ate 100% of her dinner. She also cooperated with labs yesterday. Unfortunately this morning she already refused Ativan. Per nursing staff she has been is sleeping well. She continues to have poor oral intake. Usually only eating one meal per day however she has been drinking appropriately. We completed a basic metabolic panel which was normal.  Her vital signs have been normal.   patient has been uncooperative for the last 2 days. She is always lying in bed covered with blankets. She does not talk to me or open her eyes. She told me yesterday to step out of her room. Today she told me she did not wanted to talk and wanted to end the conversation. Per nursing staff she is eating 1 meal a day, about 50% of that  meeting is what she actually eats. She has been drinking fluids. She is compliant with medications. She is allowing vital signs and they have been a stable. I will order labs today.  1/11 patient refused Ativan this morning. She only took 20 mg of Abilify. She is actually prescribed with 30 mg the patient says that it was too much. She appeared a little bit improved today. She actually sat on the bed and talk to me however she is very paranoid. She refuses to answer certain questions. Eventually when encouraged to take medications she told me that I needed to go with God. Patient tells me she was planning on stopping her fasting today.  Per nursing: Patient isolates to room.  Avoids eye contact.  Not forthcoming with information will answer yes and no questions.  Will not enter medication room unless invited in.  Shows some signs of paranoia, patient has read packaging on all medications and medications have to be opened in front of her.  Appetite poor.  Did not eat breakfast or lunch.  States "I drank an ensure that was my lunch."     Principal Problem: Schizoaffective disorder, bipolar type ([place]) Diagnosis:   Patient Active Problem List   Diagnosis Date Noted  . Schizoaffective disorder, bipolar type ([place]) [F25.0] 07/25/2015   Total Time spent with patient: 30 minutes  Past Psychiatric History: Patient has been hospitalized several times at behavioral health in [place]. She was just discharged in July 2017.  I don't have any information regarding prior suicidal attempts or self-injurious behaviors   Past Medical History:  Past Medical History:  Diagnosis Date  . Anxiety   . Bipolar 1 disorder ([place])   . Depression   . Schizophrenia ([place])    History reviewed. No pertinent surgical history.  Family History:  Family History  Problem Relation Age of Onset  . Hypertension Mother   . Mental illness Father    Family Psychiatric  History: Unknown at this point as patient is  uncooperative   Social History: Not much information is available at this time other than she lives with her mother in [place]. 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 Marital status: Single    Spouse name: N/A  . Number of children: N/A  . Years of education: N/A   Social History Main Topics  . Smoking status: Never Smoker  . Smokeless tobacco: Never Used  . Alcohol use No  . Drug use: No  . Sexual activity: No   Other Topics Concern  . None   Social History Narrative  . None   Additional Social History:    Pain Medications: none Prescriptions: none Over the Counter: none History of alcohol / drug use?: No history of alcohol / drug abuse Negative Consequences of Use: Personal relationships, Financial, Work / School Withdrawal Symptoms: Other (Comment)  Current Medications: Current Facility-Administered Medications  Medication Dose Route Frequency Provider Last Rate Last Dose  . acetaminophen (TYLENOL) tablet 650 mg  650 mg Oral Q6H PRN Hildred Priest, MD      . alum & mag hydroxide-simeth (MAALOX/MYLANTA) 200-200-20 MG/5ML suspension 30 mL  30 mL Oral Q4H PRN Hildred Priest, MD      . ARIPiprazole (ABILIFY) tablet 30 mg  30 mg Oral Daily Hildred Priest, MD   20 mg at 03/08/16 0825  . feeding supplement (ENSURE ENLIVE) (ENSURE ENLIVE) liquid 237 mL  237 mL Oral TID BM Hildred Priest, MD   237 mL at 03/07/16 2000  . LORazepam (ATIVAN) tablet 2 mg  2 mg Oral BID Hildred Priest, MD   2 mg at 03/07/16 1254  . magnesium hydroxide (MILK OF MAGNESIA) suspension 30 mL  30 mL Oral Daily PRN Hildred Priest, MD      . multivitamin with minerals tablet 1 tablet  1 tablet Oral Daily Hildred Priest, MD   1 tablet at 03/08/16 0825  . Oxcarbazepine (TRILEPTAL) tablet 300 mg   300 mg Oral BID Hildred Priest, MD   300 mg at 03/08/16 5537    Lab Results:  Results for orders placed or performed during the hospital encounter of 03/01/16 (from the past 48 hour(s))  Lipid panel     Status: Abnormal   Collection Time: 03/07/16  5:44 PM  Result Value Ref Range   Cholesterol 165 0 - 200 mg/dL   Triglycerides 67 <150 mg/dL   HDL 48 >40 mg/dL   Total CHOL/HDL Ratio 3.4 RATIO   VLDL 13 0 - 40 mg/dL   LDL Cholesterol 104 (H) 0 - 99 mg/dL    Comment:        Total Cholesterol/HDL:CHD Risk Coronary Heart Disease Risk Table                     Men   Women  1/2 Average Risk   3.4   3.3  Average Risk       5.0   4.4  2 X Average Risk   9.6   7.1  3 X Average Risk  23.4   11.0        Use the calculated Patient Ratio above and the CHD Risk Table to determine the patient's CHD Risk.        ATP III CLASSIFICATION (LDL):  <100     mg/dL   Optimal  100-129  mg/dL   Near or Above                    Optimal  130-159  mg/dL   Borderline  160-189  mg/dL   High  >190     mg/dL   Very High   Hemoglobin A1c     Status: None   Collection Time: 03/07/16  5:44 PM  Result Value Ref Range   Hgb A1c MFr Bld 5.4 4.8 - 5.6 %    Comment: (NOTE)         Pre-diabetes: 5.7 - 6.4         Diabetes: >6.4         Glycemic control for adults with diabetes: <7.0    Mean Plasma Glucose 108 mg/dL    Comment: (NOTE) Performed At: [facility] [place], [place] [id number] Lindon Romp MD [id number]   Vitamin B12     Status: None   Collection Time: 03/07/16  5:44 PM  Result Value Ref Range   Vitamin B-12 524 180 - 914 pg/mL    Comment: (NOTE) This assay is not validated for testing neonatal or myeloproliferative syndrome specimens for Vitamin B12 levels. Performed at [facility]   TSH     Status: None   Collection Time: 03/07/16  5:44 PM  Result Value Ref Range   TSH 0.943 0.350 - 4.500 uIU/mL    Comment: Performed by a 3rd  Generation assay with a functional sensitivity of <=0.01 uIU/mL.  Basic metabolic panel     Status: Abnormal   Collection Time: 03/07/16  5:44 PM  Result Value Ref Range   Sodium 141 135 - 145 mmol/L   Potassium 3.9 3.5 - 5.1 mmol/L   Chloride 103 101 - 111 mmol/L   CO2 29 22 - 32 mmol/L   Glucose, Bld 169 (H) 65 - 99 mg/dL   BUN 15 6 - 20 mg/dL   Creatinine, Ser 0.76 0.44 - 1.00 mg/dL   Calcium 9.1 8.9 - 10.3 mg/dL   GFR calc non Af Amer >60 >60 mL/min   GFR calc Af Amer >60 >60 mL/min    Comment: (NOTE) The eGFR has been calculated using the CKD EPI equation. This calculation has not been validated in all clinical situations. eGFR's persistently <60 mL/min signify possible Chronic Kidney Disease.    Anion gap 9 5 - 15    Blood Alcohol level:  Lab Results  Component Value Date   ETH <5 02/29/2016   ETH <5 67/20/9470    Metabolic Disorder Labs: Lab Results  Component Value Date   HGBA1C 5.4 03/07/2016   MPG 108 03/07/2016   MPG 117 07/27/2015   Lab Results  Component Value Date   PROLACTIN 20.7 07/27/2015   Lab Results  Component Value Date   CHOL 165 03/07/2016   TRIG 67 03/07/2016   HDL 48 03/07/2016   CHOLHDL 3.4 03/07/2016   VLDL 13 03/07/2016   LDLCALC 104 (H) 03/07/2016   [place] 95 07/27/2015    Physical Findings: AIMS:  , ,  ,  ,    CIWA:    COWS:  COWS Total Score: 1  Musculoskeletal: Strength & Muscle Tone: within normal limits Gait & Station: normal Patient leans: N/A  Psychiatric Specialty Exam: Physical Exam  Constitutional: She is oriented to person, place, and time. She appears well-developed and well-nourished.  HENT:  Head: Normocephalic and atraumatic.  Eyes: Conjunctivae and EOM are normal.  Neck: Normal range of motion.  Respiratory: Effort normal.  Musculoskeletal: Normal range of motion.  Neurological: She is alert and oriented to person, place, and time.    Review of Systems  Unable to perform ROS: Acuity of condition     Blood pressure 125/79, pulse 94, temperature 99 F (37.2 C), temperature source Oral, resp. rate 20, height 5' 4"  (1.626 m), weight 90.7 kg (200 lb), last menstrual period 02/27/2016, SpO2 93 %.Body mass index is 34.33 kg/m.  General Appearance: Disheveled  Eye Contact:  Minimal  Speech:  Normal Rate  Volume:  Normal  Mood:  Irritable  Affect:  Constricted  Thought Process:  Disorganized and Descriptions of Associations: Loose  Orientation:  Full (Time, Place, and Person)  Thought Content:  Delusions  Suicidal Thoughts:  No  Homicidal Thoughts:  No  Memory:  Immediate;   Poor Recent;   Poor Remote;   Poor  Judgement:  Impaired  Insight:  Lacking  Psychomotor Activity:  Decreased  Concentration:  Concentration: Poor and  Attention Span: Poor  Recall:  Poor  Fund of Knowledge:  Poor  Language:  Fair  Akathisia:  No  Handed:    AIMS (if indicated):     Assets:  Communication Skills Leisure Time  ADL's:  Intact  Cognition:  Impaired,  Mild  Sleep:  Number of Hours: 7.5     Treatment Plan Summary:  No improvement since admission. Basically nonverbal. Withdrawn to her room minimal oral intake. Only eating one meal a day and Ativan in she is only eating about 50%. She has been drinking ensures per nursing staff.  Patient is a 41 year old Caucasian female with history of schizoaffective bipolar type. He presented to the emergency department due to aggression, agitation, and psychosis. Patient has not been compliant with medications.  For schizoaffective disorder: Patient has been lying in bed all day. She is not attending any programming. She is refusing to speak to me. She refused to speak with me and the Education officer, museum. She has been invited to participate in treatment team twice but she has declined.  Patient has been is started on on Abilify and Trileptal 300 mg twice a day.  Plan to titrate up Abilify and offer Abilify injectable  Continue abilify to 30 mg. She refuses to  take full dose of Abilify in today. She was unwilling to take 20 mg. I will put the orders for nonemergency forced medications again. If patient refuses. Dose of Abilify and she will receive olanzapine IM.  For now will discontinue non emergency forced medications as She has been compliant with Abilify and Trileptal  For possible catatonia patient started on Ativan 2 mg twice a day.----Only took ativan yesterday which appeared beneficial.  Refused it this am  For agitation: continue Ativan 2 mg every 4 hours as needed  Patient require Manual Hold on 1/4 as she refused to leave the office where I was interviewing her on 1/4.   Ordered multivitamins  Diet regular  Vital signs daily  Continue to push fluids. Ordered ensure tid  Precautions every 15 minute checks  Hospitalization status continue involuntary commitment------ referral has been made to [facility].  We will provide the mother with a letter recommending her to become legal guardian.  Labs: TSH, hemoglobin A1c and lipid panel, b12, BMP--has refused all labs---finally cooperative with labs on 1/10.  All wnl  Collateral information has been  obtained from the patient's mother. Patient has declined for [place] to have any contact with the mother however at this point in time she does not have capacity to make that decision. She is very psychotic. The mother will be contacted again today. We plan to discuss with her the need of guardianship in this case. This patient has refused to apply for Medicaid or disability. She isn't paranoid and unable to work. Mother feels very unsafe with her around the house as patient is noncompliant with medication. Patient displayed aggression towards the family prior to admission. They do not plan for her would be to have potentially group home placement, and act team and a guardian and I will agree for a long acting injectable.    Spoke with the patient's mother again on 1/11. We we  will write a letter for deal for [place] court supporting the need for the patient to have a guardian. The letter will be given mail to his mother. She plans to go to the court house next week and apply for guardianship.   Hildred Priest, MD 03/08/2016, 9:47 AM

## 2016-03-08 NOTE — Progress Notes (Signed)
Patient visible in the milieu,interacting with peers.Denies suicidal or homicidal ideations.When asked for A hallucination patient states" I can hear you talking."Fluids offered.Dringing ensure and fluids.

## 2016-03-09 MED ORDER — LORAZEPAM 2 MG PO TABS
2.0000 mg | ORAL_TABLET | Freq: Three times a day (TID) | ORAL | Status: DC
  Administered 2016-03-09 – 2016-03-11 (×5): 2 mg via ORAL
  Filled 2016-03-09 (×6): qty 1

## 2016-03-09 NOTE — Progress Notes (Signed)
D: Pt denies SI/HI/AVH, but noted responding to internal stimuli. Pt is labile and suspicious of staff. Pt thoughts are disorganized she appears anxious and she is not interacting with peers and staff appropriately.  A: Pt was offered support and encouragement. Pt was given scheduled medications. Pt was encouraged to attend groups. Q 15 minute checks were done for safety.  R:Pt did not attend groups. Pt is taking medication. Pt receptive to treatment and safety maintained on unit.

## 2016-03-09 NOTE — Plan of Care (Signed)
Problem: Self-Concept: Goal: Ability to verbalize positive feelings about self will improve Outcome: Not Progressing Patient will only answer yes and no questions.  Will not expound on anything.

## 2016-03-09 NOTE — [facility] Group Notes (Signed)
[facility] LCSW Group Therapy   03/09/2016 1 PM   Type of Therapy: Group Therapy   Participation Level: Pt invited but did not attend.  Participation Quality: Pt invited but did not attend.  Summary of Progress/Problems: The topic for today was feelings about relapse. Pt discussed what relapse prevention is to them and identified triggers that they are on the path to relapse. Pt processed their feeling towards relapse and was able to relate to peers. Pt discussed coping skills that can be used for relapse prevention.    Hampton AbbotKadijah Rosaelena Kemnitz, MSW, LCSWA 03/09/2016, 2:20PM

## 2016-03-09 NOTE — Progress Notes (Signed)
Patient more visible on the unit she does not interact with peers.  Stays to self.  Noted talking to self as walks down the hall.  Paranoid and exhibits bizarre behavior.  Took medications this am although asked if could take a lower dose of her abilify.  Informed that if did not take po medications doctor has instructed to give IM.  Patient agreed to take medications.  Patient took shower today and washed her hair.  Patient ate lunch and dinner.  Support and encouragement offered.  Safety maintained.

## 2016-03-09 NOTE — Progress Notes (Signed)
Pat spent most of evening resting in bed. When writer went to her bedside she stated that I had been there since yesterday. I told her I just came in at 07:00pm and I was her nurse. She had minimal interactions with peers and staff. Behavior remains bizarre. She appears to be in bed resting quietly with eyes closed.

## 2016-03-09 NOTE — Tx Team (Signed)
Interdisciplinary Treatment and Diagnostic Plan Update  03/09/2016 Time of Session: 10:30 AM Jessica ChaletJennifer L [name] MRN: [id number]  Principal Diagnosis: Schizoaffective disorder, bipolar type [facility](HCC)  Secondary Diagnoses: Principal Problem:   Schizoaffective disorder, bipolar type (HCC)   Current Medications:  Current Facility-Administered Medications  Medication Dose Route Frequency Provider Last Rate Last Dose  . acetaminophen (TYLENOL) tablet 650 mg  650 mg Oral Q6H PRN Jimmy FootmanAndrea Hernandez-Gonzalez, MD      . alum & mag hydroxide-simeth (MAALOX/MYLANTA) 200-200-20 MG/5ML suspension 30 mL  30 mL Oral Q4H PRN Jimmy FootmanAndrea Hernandez-Gonzalez, MD      . ARIPiprazole (ABILIFY) tablet 30 mg  30 mg Oral Daily Jimmy FootmanAndrea Hernandez-Gonzalez, MD   30 mg at 03/09/16 0837  . OLANZapine (ZYPREXA) injection 10 mg  10 mg Intramuscular Daily Jimmy FootmanAndrea Hernandez-Gonzalez, MD       Or  . ARIPiprazole (ABILIFY) tablet 30 mg  30 mg Oral Daily Jimmy FootmanAndrea Hernandez-Gonzalez, MD      . feeding supplement (ENSURE ENLIVE) (ENSURE ENLIVE) liquid 237 mL  237 mL Oral TID BM Jimmy FootmanAndrea Hernandez-Gonzalez, MD   237 mL at 03/09/16 1000  . LORazepam (ATIVAN) tablet 2 mg  2 mg Oral TID Jimmy FootmanAndrea Hernandez-Gonzalez, MD      . magnesium hydroxide (MILK OF MAGNESIA) suspension 30 mL  30 mL Oral Daily PRN Jimmy FootmanAndrea Hernandez-Gonzalez, MD      . multivitamin with minerals tablet 1 tablet  1 tablet Oral Daily Jimmy FootmanAndrea Hernandez-Gonzalez, MD   1 tablet at 03/09/16 0837  . Oxcarbazepine (TRILEPTAL) tablet 300 mg  300 mg Oral BID Jimmy FootmanAndrea Hernandez-Gonzalez, MD   300 mg at 03/09/16 [id number]   PTA Medications: Prescriptions Prior to Admission  Medication Sig Dispense Refill Last Dose  . ARIPiprazole (ABILIFY) 5 MG tablet Take 5 mg by mouth daily.   02/29/2016 at Unknown time  . doxepin (SINEQUAN) 10 MG capsule Take 2 capsules (20 mg total) by mouth at bedtime as needed (sleep). 30 capsule 0 unknown  . hydrOXYzine (ATARAX/VISTARIL) 25 MG tablet Take 1 tablet (25 mg  total) by mouth every 6 (six) hours as needed for anxiety. 30 tablet 0 unknown  . OLANZapine (ZYPREXA) 2.5 MG tablet Take 5 tablets (12.5 mg total) by mouth at bedtime. (Patient not taking: Reported on 02/29/2016) 30 tablet 0 Not Taking at Unknown time  . OXcarbazepine (TRILEPTAL) 150 MG tablet Take 1 tablet (150 mg total) by mouth 2 (two) times daily. 60 tablet 0 02/29/2016 at Unknown time  . ziprasidone (GEODON) 20 MG capsule Take 1 capsule (20 mg total) by mouth 2 (two) times daily as needed (SEVERE ANXIETY/AGITATION). 30 capsule 0 unknown    Patient Stressors: Financial difficulties Marital or family conflict Medication change or noncompliance  Patient Strengths: Ability for Warden/rangerinsight Communication skills Motivation for treatment/growth  Treatment Modalities: Medication Management, Group therapy, Case management,  1 to 1 session with clinician, Psychoeducation, Recreational therapy.   Physician Treatment Plan for Primary Diagnosis: Schizoaffective disorder, bipolar type (HCC) Long Term Goal(s): Improvement in symptoms so as ready for discharge Improvement in symptoms so as ready for discharge   Short Term Goals: Ability to identify changes in lifestyle to reduce recurrence of condition will improve Ability to demonstrate self-control will improve Ability to identify and develop effective coping behaviors will improve Compliance with prescribed medications will improve Ability to identify triggers associated with substance abuse/mental health issues will improve Ability to identify changes in lifestyle to reduce recurrence of condition will improve Ability to verbalize feelings will improve Ability to demonstrate self-control  will improve Compliance with prescribed medications will improve Ability to identify triggers associated with substance abuse/mental health issues will improve  Medication Management: Evaluate patient's response, side effects, and tolerance of medication  regimen.  Therapeutic Interventions: 1 to 1 sessions, Unit Group sessions and Medication administration.  Evaluation of Outcomes: Progressing  Physician Treatment Plan for Secondary Diagnosis: Principal Problem:   Schizoaffective disorder, bipolar type (HCC)  Long Term Goal(s): Improvement in symptoms so as ready for discharge Improvement in symptoms so as ready for discharge   Short Term Goals: Ability to identify changes in lifestyle to reduce recurrence of condition will improve Ability to demonstrate self-control will improve Ability to identify and develop effective coping behaviors will improve Compliance with prescribed medications will improve Ability to identify triggers associated with substance abuse/mental health issues will improve Ability to identify changes in lifestyle to reduce recurrence of condition will improve Ability to verbalize feelings will improve Ability to demonstrate self-control will improve Compliance with prescribed medications will improve Ability to identify triggers associated with substance abuse/mental health issues will improve     Medication Management: Evaluate patient's response, side effects, and tolerance of medication regimen.  Therapeutic Interventions: 1 to 1 sessions, Unit Group sessions and Medication administration.  Evaluation of Outcomes: Progressing   RN Treatment Plan for Primary Diagnosis: Schizoaffective disorder, bipolar type (HCC) Long Term Goal(s): Knowledge of disease  Short Term Goals: Ability to remain free from injury will improve, Ability to demonstrate self-control, Ability to disclose and discuss suicidal ideas and Compliance with prescribed medications will improve  Medication Management: RN will administer medications as ordered by provider, will assess and evaluate patient's response and provide education to patient for prescribed medication. RN will report any adverse and/or side effects to prescribing  provider.  Therapeutic Interventions: 1 on 1 counseling sessions, Psychoeducation, Medication administration, Evaluate responses to treatment, Monitor vital signs and CBGs as ordered, Perform/monitor CIWA, COWS, AIMS and Fall Risk screenings as ordered, Perform wound care treatments as ordered.  Evaluation of Outcomes: Progressing   LCSW Treatment Plan for Primary Diagnosis: Schizoaffective disorder, bipolar type (HCC) Long Term Goal(s): Safe transition to appropriate next level of care at discharge, Engage patient in therapeutic group addressing interpersonal concerns.  Short Term Goals: Engage patient in aftercare planning with referrals and resources, Increase social support, Facilitate acceptance of mental health diagnosis and concerns and Increase skills for wellness and recovery  Therapeutic Interventions: Assess for all discharge needs, 1 to 1 time with Social worker, Explore available resources and support systems, Assess for adequacy in community support network, Educate family and significant other(s) on suicide prevention, Complete Psychosocial Assessment, Interpersonal group therapy.  Evaluation of Outcomes: Progressing   Progress in Treatment: Attending groups: No. Participating in groups: No. Taking medication as prescribed: Yes. Toleration medication: Yes. Family/Significant other contact made: No, will contact:  CSW assessing proper contacts. Patient understands diagnosis: Yes. Discussing patient identified problems/goals with staff: Yes. Medical problems stabilized or resolved: Yes. Denies suicidal/homicidal ideation: Yes. Issues/concerns per patient self-inventory: No.  New problem(s) identified: No, Describe:  None identified.   Reason for Continuation of Hospitalization: Delusions  Hallucinations  Estimated Length of Stay: 7 days  Attendees: Patient: Jessica [name] 03/09/2016 11:17 AM  Physician: Dr. Radene Journey, MD  03/09/2016 11:17 AM  Nursing:  Leonia Reader, RN 03/09/2016 11:17 AM  RN Care Manager: 03/09/2016 11:17 AM  Social Worker: Hampton Abbot, MSW, LCSW-A 03/09/2016 11:17 AM  Recreational Therapist: Hershal Coria, LRT, CTRS  03/09/2016 11:17 AM   Scribe  for Treatment Team: Lynden Oxford, Theresia Majors 03/09/2016 11:17 AM

## 2016-03-09 NOTE — Progress Notes (Signed)
[facility] MD Progress Note  03/09/2016 11:41 AM Jessica [name]  MRN:  [id number] Subjective:  Patient is a 41 year old Caucasian female with history of schizoaffective disorder bipolar type. She was brought in by police to [facility] emergency department on January 3rd. Per the emergency departments note patient's mother called EMS after the patient grabbed her arm and wouldn't let go. Upon EMS arrival the patient was standing aggressively in the doorway and saying "[name], [name]. There is too much evil in [place]". She was uncooperative with evaluation yelling occasionally. Nurses in the ER described her as agitated and refusing to dressed out in his scrubs tangential.  It has been reported patient was noncompliant with her medications.  Per chart review she was hospitalized twice last year in behavioral health [place]. Her last admission was in June 2017. She was discharged with a diagnosis of schizoaffective disorder bipolar type. During her stay in the hospital she was placed on known emergency forced medications.  1/5 patient refused all medications yesterday. She had a Manual hold on 1/4 after she refused to leave the office where she was being interviewed. Today she has been started unknown emergency forced medications. She refuses oral olanzapine earlier this morning but took it later on. Patient has been refusing to eat or drink anything. She has been in bed all day. She refused to speak with me during assessment today her only answer was "I have no comment". She refused to open her mouth to evaluate for dehydration.  1/8 patient was more cooperative and engaging assessment today. She tells me she's been diagnosed with bipolar disorder and was prescribed with Trileptal and Abilify. She wonders if she can take these medications again as they help her. Patient tells me she has slept well over the last 2 nights. She is not having homicidal or suicidal ideation. She has been having visual  hallucinations which she sees as periods she says she does not feel threatened or scared when she sees the spirit. As far as side effects that she felt little lightheaded this morning were no other side effects. Last week she was refusing to eat now she is saying she is feels her appetite is very low but she tried to eat some last night. She tells me she's been drinking ensures and Gatorades.   She has been compliant with most medications but has refused Depakote. This morning she only took 2.5 mg of olanzapine not the whole dose order which was 10 mg.    1/9 No much improvement. She refused to speak with me today she asked me to step out of the room. Did not answer any of my questions.  Oral intake is is still poor looks like she is having only one meal per day. She has been drinking ensures and gatorades. Vital signs are within the normal limits. She complied with medications yesterday after antipsychotic was changed from olanzapine to Abilify. Per nursing notes she slept 7 hours  1/10 patient has been compliant with Abilify and Trileptal. Yesterday she took 2 doses of Ativan 2 mg which appeared effective. Patient was seen in the evening in the dayroom having dinner. He was documented the patient ate 100% of her dinner. She also cooperated with labs yesterday. Unfortunately this morning she already refused Ativan. Per nursing staff she has been is sleeping well. She continues to have poor oral intake. Usually only eating one meal per day however she has been drinking appropriately. We completed a basic metabolic panel which was normal.  Her vital signs have been normal.   patient has been uncooperative for the last 2 days. She is always lying in bed covered with blankets. She does not talk to me or open her eyes. She told me yesterday to step out of her room. Today she told me she did not wanted to talk and wanted to end the conversation. Per nursing staff she is eating 1 meal a day, about 50% of that  meeting is what she actually eats. She has been drinking fluids. She is compliant with medications. She is allowing vital signs and they have been a stable. I will order labs today.  1/11 patient refused Ativan this morning. She only took 20 mg of Abilify. She is actually prescribed with 30 mg the patient says that it was too much. She appeared a little bit improved today. She actually sat on the bed and talk to me however she is very paranoid. She refuses to answer certain questions. Eventually when encouraged to take medications she told me that I needed to go with God. Patient tells me she was planning on stopping her fasting today.  1/12 no much improvement. Patient has to be reminded about the known emergency forced medication orders for her to comply. Yesterday she had 2 meals, she ate 30% of her lunch and 90% of her dinner. Her vital signs have been within the normal limits. Patient says that she is not longer having auditory hallucinations. She tells me that her mood has improved and is now "neutral". She complains of feeling dizzy. Denies any other issues or concerns. She was only partially cooperative during assessment. She was lying in bed covered with blankets and talk to me with her eyes closed.  Per nursing: Patient took medications this morning as they were prescribed. She however complaining about any had to be reminded that if she didn't comply with oral medication she had orders for injectable medications. Staff reported that oral intake appears to be improving. Patient continues to be paranoid, bizarre and hyperreligious.   Principal Problem: Schizoaffective disorder, bipolar type ([place]) Diagnosis:   Patient Active Problem List   Diagnosis Date Noted  . Schizoaffective disorder, bipolar type ([place]) [F25.0] 07/25/2015   Total Time spent with patient: 30 minutes  Past Psychiatric History: Patient has been hospitalized several times at behavioral health in [place]. She was just  discharged in July 2017.  I don't have any information regarding prior suicidal attempts or self-injurious behaviors   Past Medical History:  Past Medical History:  Diagnosis Date  . Anxiety   . Bipolar 1 disorder ([place])   . Depression   . Schizophrenia ([place])    History reviewed. No pertinent surgical history.  Family History:  Family History  Problem Relation Age of Onset  . Hypertension Mother   . Mental illness Father    Family Psychiatric  History: Unknown at this point as patient is uncooperative   Social History: Not much information is available at this time other than she lives with her mother in [place]. 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 Marital status: Single    Spouse name: N/A  . Number of children: N/A  . Years of education: N/A   Social History Main Topics  . Smoking status: Never Smoker  . Smokeless tobacco: Never Used  .  Alcohol use No  . Drug use: No  . Sexual activity: No   Other Topics Concern  . None   Social History Narrative  . None   Additional Social History:    Pain Medications: none Prescriptions: none Over the Counter: none History of alcohol / drug use?: No history of alcohol / drug abuse Negative Consequences of Use: Personal relationships, Financial, Work / School Withdrawal Symptoms: Other (Comment)      Current Medications: Current Facility-Administered Medications  Medication Dose Route Frequency Provider Last Rate Last Dose  . acetaminophen (TYLENOL) tablet 650 mg  650 mg Oral Q6H PRN Hildred Priest, MD      . alum & mag hydroxide-simeth (MAALOX/MYLANTA) 200-200-20 MG/5ML suspension 30 mL  30 mL Oral Q4H PRN Hildred Priest, MD      . ARIPiprazole (ABILIFY) tablet 30 mg  30 mg Oral Daily Hildred Priest, MD   30 mg at 03/09/16 0837  . OLANZapine  (ZYPREXA) injection 10 mg  10 mg Intramuscular Daily Hildred Priest, MD       Or  . ARIPiprazole (ABILIFY) tablet 30 mg  30 mg Oral Daily Hildred Priest, MD      . feeding supplement (ENSURE ENLIVE) (ENSURE ENLIVE) liquid 237 mL  237 mL Oral TID BM Hildred Priest, MD   237 mL at 03/09/16 1000  . LORazepam (ATIVAN) tablet 2 mg  2 mg Oral TID Hildred Priest, MD      . magnesium hydroxide (MILK OF MAGNESIA) suspension 30 mL  30 mL Oral Daily PRN Hildred Priest, MD      . multivitamin with minerals tablet 1 tablet  1 tablet Oral Daily Hildred Priest, MD   1 tablet at 03/09/16 0837  . Oxcarbazepine (TRILEPTAL) tablet 300 mg  300 mg Oral BID Hildred Priest, MD   300 mg at 03/09/16 9798    Lab Results:  Results for orders placed or performed during the hospital encounter of 03/01/16 (from the past 48 hour(s))  Lipid panel     Status: Abnormal   Collection Time: 03/07/16  5:44 PM  Result Value Ref Range   Cholesterol 165 0 - 200 mg/dL   Triglycerides 67 <150 mg/dL   HDL 48 >40 mg/dL   Total CHOL/HDL Ratio 3.4 RATIO   VLDL 13 0 - 40 mg/dL   LDL Cholesterol 104 (H) 0 - 99 mg/dL    Comment:        Total Cholesterol/HDL:CHD Risk Coronary Heart Disease Risk Table                     Men   Women  1/2 Average Risk   3.4   3.3  Average Risk       5.0   4.4  2 X Average Risk   9.6   7.1  3 X Average Risk  23.4   11.0        Use the calculated Patient Ratio above and the CHD Risk Table to determine the patient's CHD Risk.        ATP III CLASSIFICATION (LDL):  <100     mg/dL   Optimal  100-129  mg/dL   Near or Above                    Optimal  130-159  mg/dL   Borderline  160-189  mg/dL   High  >190     mg/dL   Very High   Hemoglobin A1c  Status: None   Collection Time: 03/07/16  5:44 PM  Result Value Ref Range   Hgb A1c MFr Bld 5.4 4.8 - 5.6 %    Comment: (NOTE)         Pre-diabetes: 5.7 - 6.4          Diabetes: >6.4         Glycemic control for adults with diabetes: <7.0    Mean Plasma Glucose 108 mg/dL    Comment: (NOTE) Performed At: [facility] [place], [place] [id number] Lindon Romp MD NW:[id number]   Vitamin B12     Status: None   Collection Time: 03/07/16  5:44 PM  Result Value Ref Range   Vitamin B-12 524 180 - 914 pg/mL    Comment: (NOTE) This assay is not validated for testing neonatal or myeloproliferative syndrome specimens for Vitamin B12 levels. Performed at [facility]   TSH     Status: None   Collection Time: 03/07/16  5:44 PM  Result Value Ref Range   TSH 0.943 0.350 - 4.500 uIU/mL    Comment: Performed by a 3rd Generation assay with a functional sensitivity of <=0.01 uIU/mL.  Basic metabolic panel     Status: Abnormal   Collection Time: 03/07/16  5:44 PM  Result Value Ref Range   Sodium 141 135 - 145 mmol/L   Potassium 3.9 3.5 - 5.1 mmol/L   Chloride 103 101 - 111 mmol/L   CO2 29 22 - 32 mmol/L   Glucose, Bld 169 (H) 65 - 99 mg/dL   BUN 15 6 - 20 mg/dL   Creatinine, Ser 0.76 0.44 - 1.00 mg/dL   Calcium 9.1 8.9 - 10.3 mg/dL   GFR calc non Af Amer >60 >60 mL/min   GFR calc Af Amer >60 >60 mL/min    Comment: (NOTE) The eGFR has been calculated using the CKD EPI equation. This calculation has not been validated in all clinical situations. eGFR's persistently <60 mL/min signify possible Chronic Kidney Disease.    Anion gap 9 5 - 15    Blood Alcohol level:  Lab Results  Component Value Date   ETH <5 02/29/2016   ETH <5 57/84/6962    Metabolic Disorder Labs: Lab Results  Component Value Date   HGBA1C 5.4 03/07/2016   MPG 108 03/07/2016   MPG 117 07/27/2015   Lab Results  Component Value Date   PROLACTIN 20.7 07/27/2015   Lab Results  Component Value Date   CHOL 165 03/07/2016   TRIG 67 03/07/2016   HDL 48 03/07/2016   CHOLHDL 3.4 03/07/2016   VLDL 13 03/07/2016   LDLCALC 104 (H) 03/07/2016    [place] 95 07/27/2015    Physical Findings: AIMS:  , ,  ,  ,    CIWA:    COWS:  COWS Total Score: 1  Musculoskeletal: Strength & Muscle Tone: within normal limits Gait & Station: normal Patient leans: N/A  Psychiatric Specialty Exam: Physical Exam  Constitutional: She is oriented to person, place, and time. She appears well-developed and well-nourished.  HENT:  Head: Normocephalic and atraumatic.  Eyes: Conjunctivae and EOM are normal.  Neck: Normal range of motion.  Respiratory: Effort normal.  Musculoskeletal: Normal range of motion.  Neurological: She is alert and oriented to person, place, and time.    Review of Systems  Unable to perform ROS: Acuity of condition    Blood pressure 122/66, pulse 87, temperature 98.7 F (37.1 C), temperature source Oral, resp. rate  20, height _0  (1.626 m), weight 90.7 kg (200 lb), last menstrual period 02/27/2016, SpO2 99 %.Body mass index is 34.33 kg/m.  General Appearance: Disheveled  Eye Contact:  Minimal  Speech:  Normal Rate  Volume:  Normal  Mood:  Irritable  Affect:  Constricted  Thought Process:  Disorganized and Descriptions of Associations: Loose  Orientation:  Full (Time, Place, and Person)  Thought Content:  Delusions  Suicidal Thoughts:  No  Homicidal Thoughts:  No  Memory:  Immediate;   Poor Recent;   Poor Remote;   Poor  Judgement:  Impaired  Insight:  Lacking  Psychomotor Activity:  Decreased  Concentration:  Concentration: Poor and Attention Span: Poor  Recall:  Poor  Fund of Knowledge:  Poor  Language:  Fair  Akathisia:  No  Handed:    AIMS (if indicated):     Assets:  Communication Skills Leisure Time  ADL's:  Intact  Cognition:  Impaired,  Mild  Sleep:  Number of Hours: 6.5     Treatment Plan Summary:  No improvement since admission. Basically nonverbal. Withdrawn to her room minimal oral intake. Only eating one meal a day and Ativan in she is only eating about 50%. She has been drinking ensures  per nursing staff.  Patient is a 41 year old Caucasian female with history of schizoaffective bipolar type. He presented to the emergency department due to aggression, agitation, and psychosis. Patient has not been compliant with medications.  For schizoaffective disorder: Patient has been lying in bed all day. She is not attending any programming. She is refusing to speak to me. She refused to speak with me and the Education officer, museum. She has been invited to participate in treatment team twice but she has declined.  Patient has been is started on on Abilify and Trileptal 300 mg twice a day.  Plan to titrate up Abilify and offer Abilify injectable. Abilify was chosen as patient requested to take it and the patient's mother reported patient had had a good response to Abilify in the past. The patient has been taking Abilify since Jan 5th. Not much improvement and has yet been sneaking when my need to consider the possibility of switching to a different antipsychotic. Patient initially received olanzapine in these medications seemed to help her. The other possibility would be to consider Lorayne Bender (which has a long acting injectable) as the patient has history of being noncompliant.   Continue abilify to 30 mg. She refuses to take full dose of Abilify on 1/11. She was only willing to take 20 mg.Orders for Non emergency forced medications again were placed again. If patient refuses full ose of Abilify and she will receive olanzapine IM.  For possible catatonia patient started on Ativan 2 mg tid.----Appears to come out of her room more often when after she takes Ativan.  For agitation: continue Ativan 2 mg every 4 hours as needed  Patient require Manual Hold on 1/4 as she refused to leave the office where I was interviewing her on 1/4.   Ordered multivitamins  Diet regular  Vital signs daily  Continue to push fluids. Ordered ensure tid  Precautions every 15 minute checks  Hospitalization status  continue involuntary commitment------ referral has been [facility] to [facility].  We will provide the mother with a letter recommending her to become legal guardian. Letter recommending guardian she was mailed to the patient's mother on 1/11.   Labs: TSH, hemoglobin A1c and lipid panel, b12, BMP--has refused all labs---finally cooperative with labs  on 1/10.  All wnl  Collateral information has been  obtained from the patient's mother. Patient has declined for [place] to have any contact with the mother however at this point in time she does not have capacity to make that decision. She is very psychotic. The mother will be contacted again today. We plan to discuss with her the need of guardianship in this case. This patient has refused to apply for Medicaid or disability. She isn't paranoid and unable to work. Mother feels very unsafe with her around the house as patient is noncompliant with medication. Patient displayed aggression towards the family prior to admission. They do not plan for her would be to have potentially group home placement, and act team and a guardian and I will agree for a long acting injectable.   Spoke with the patient's mother again on 1/11. We we will write a letter for deal for [place] court supporting the need for the patient to have a guardian. The letter will be given mail to his mother. She plans to go to the court house next week and apply for guardianship.   Mother will call SW for updates on Wednesday 1/17 at 2 pm.  Mother plans to take letter to court house in [place] on Tuesday of next week.   Hildred Priest, MD 03/09/2016, 11:41 AM

## 2016-03-09 NOTE — Progress Notes (Signed)
Recreation Therapy Notes  Date: 01.12.18 Time: 9:30 am Location: Craft Room  Group Topic: Stress Management  Goal Area(s) Addresses:  Patient will participate in relaxation techniques. Patient will verbalize benefit of using relaxation techniques.  Behavioral Response: Did not attend  Intervention: Relaxation Techniques  Activity: LRT educated and provided patients handouts on with relaxation techniques. Patients practiced the relaxation techniques.  Education: LRT educated patients on why the relaxation techniques are important.  Education Outcome: Patient did not attend group.  Clinical Observations/Feedback: Patient did not attend group.   Jacquelynn CreeGreene,Mekenna Finau M, LRT/CTRS 03/09/2016 10:05 AM

## 2016-03-10 NOTE — Progress Notes (Signed)
[facility] MD Progress Note  03/10/2016 3:21 PM DONNARAE RAE  MRN:  [id number] Subjective:  Patient is a 41 year old Caucasian female with history of schizoaffective disorder bipolar type. She was brought in by police to [facility] emergency department on January 3rd. Per the emergency departments note patient's mother called EMS after the patient grabbed her arm and wouldn't let go. Upon EMS arrival the patient was standing aggressively in the doorway and saying "[place], [place]. There is too much evil in [place]". She was uncooperative with evaluation yelling occasionally. Nurses in the ER described her as agitated and refusing to dressed out in his scrubs tangential.  It has been reported patient was noncompliant with her medications.  Per chart review she was hospitalized twice last year in behavioral health [place]. Her last admission was in June 2017. She was discharged with a diagnosis of schizoaffective disorder bipolar type. During her stay in the hospital she was placed on known emergency forced medications.  1/5 patient refused all medications yesterday. She had a Manual hold on 1/4 after she refused to leave the office where she was being interviewed. Today she has been started unknown emergency forced medications. She refuses oral olanzapine earlier this morning but took it later on. Patient has been refusing to eat or drink anything. She has been in bed all day. She refused to speak with me during assessment today her only answer was "I have no comment". She refused to open her mouth to evaluate for dehydration.  1/8 patient was more cooperative and engaging assessment today. She tells me she's been diagnosed with bipolar disorder and was prescribed with Trileptal and Abilify. She wonders if she can take these medications again as they help her. Patient tells me she has slept well over the last 2 nights. She is not having homicidal or suicidal ideation. She has been having visual  hallucinations which she sees as periods she says she does not feel threatened or scared when she sees the spirit. As far as side effects that she felt little lightheaded this morning were no other side effects. Last week she was refusing to eat now she is saying she is feels her appetite is very low but she tried to eat some last night. She tells me she's been drinking ensures and Gatorades.   She has been compliant with most medications but has refused Depakote. This morning she only took 2.5 mg of olanzapine not the whole dose order which was 10 mg.    1/9 No much improvement. She refused to speak with me today she asked me to step out of the room. Did not answer any of my questions.  Oral intake is is still poor looks like she is having only one meal per day. She has been drinking ensures and gatorades. Vital signs are within the normal limits. She complied with medications yesterday after antipsychotic was changed from olanzapine to Abilify. Per nursing notes she slept 7 hours  1/10 patient has been compliant with Abilify and Trileptal. Yesterday she took 2 doses of Ativan 2 mg which appeared effective. Patient was seen in the evening in the dayroom having dinner. He was documented the patient ate 100% of her dinner. She also cooperated with labs yesterday. Unfortunately this morning she already refused Ativan. Per nursing staff she has been is sleeping well. She continues to have poor oral intake. Usually only eating one meal per day however she has been drinking appropriately. We completed a basic metabolic panel which was normal.  Her vital signs have been normal.   patient has been uncooperative for the last 2 days. She is always lying in bed covered with blankets. She does not talk to me or open her eyes. She told me yesterday to step out of her room. Today she told me she did not wanted to talk and wanted to end the conversation. Per nursing staff she is eating 1 meal a day, about 50% of that  meeting is what she actually eats. She has been drinking fluids. She is compliant with medications. She is allowing vital signs and they have been a stable. I will order labs today.  1/11 patient refused Ativan this morning. She only took 20 mg of Abilify. She is actually prescribed with 30 mg the patient says that it was too much. She appeared a little bit improved today. She actually sat on the bed and talk to me however she is very paranoid. She refuses to answer certain questions. Eventually when encouraged to take medications she told me that I needed to go with God. Patient tells me she was planning on stopping her fasting today.  1/12 no much improvement. Patient has to be reminded about the known emergency forced medication orders for her to comply. Yesterday she had 2 meals, she ate 30% of her lunch and 90% of her dinner. Her vital signs have been within the normal limits. Patient says that she is not longer having auditory hallucinations. She tells me that her mood has improved and is now "neutral". She complains of feeling dizzy. Denies any other issues or concerns. She was only partially cooperative during assessment. She was lying in bed covered with blankets and talk to me with her eyes closed.  Per nursing: Patient took medications this morning as they were prescribed. She however complaining about any had to be reminded that if she didn't comply with oral medication she had orders for injectable medications. Staff reported that oral intake appears to be improving. Patient continues to be paranoid, bizarre and hyperreligious  Patient seen today. She was in bed and would not respond to me. I shook her quite a bit on the shoulder and it was clear to me that she was intentionally ignoring me. Eventually she did respond and said that she wanted to just sleep some more. She is staying isolated and does not engage in groups. Minimal cooperation with treatment..   Principal Problem:  Schizoaffective disorder, bipolar type (HCC) Diagnosis:   Patient Active Problem List   Diagnosis Date Noted  . Schizoaffective disorder, bipolar type (HCC) [F25.0] 07/25/2015   Total Time spent with patient: 30 minutes  Past Psychiatric History: Patient has been hospitalized several times at behavioral health in [place]. She was just discharged in July 2017.  I don't have any information regarding prior suicidal attempts or self-injurious behaviors   Past Medical History:  Past Medical History:  Diagnosis Date  . Anxiety   . Bipolar 1 disorder (HCC)   . Depression   . Schizophrenia (HCC)    History reviewed. No pertinent surgical history.  Family History:  Family History  Problem Relation Age of Onset  . Hypertension Mother   . Mental illness Father    Family Psychiatric  History: Unknown at this point as patient is uncooperative   Social History: Not much information is available at this time other than she lives with her mother in [place]. 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 Marital status: Single    Spouse name: N/A  . Number of children: N/A  . Years of education: N/A   Social History Main Topics  . Smoking status: Never Smoker  . Smokeless tobacco: Never Used  . Alcohol use No  . Drug use: No  . Sexual activity: No   Other Topics Concern  . None   Social History Narrative  . None   Additional Social History:    Pain Medications: none Prescriptions: none Over the Counter: none History of alcohol / drug use?: No history of alcohol / drug abuse Negative Consequences of Use: Personal relationships, Financial, Work / School Withdrawal Symptoms: Other (Comment)      Current Medications: Current Facility-Administered Medications  Medication Dose Route Frequency Provider Last Rate Last Dose  . acetaminophen  (TYLENOL) tablet 650 mg  650 mg Oral Q6H PRN Jimmy FootmanAndrea Hernandez-Gonzalez, MD      . alum & mag hydroxide-simeth (MAALOX/MYLANTA) 200-200-20 MG/5ML suspension 30 mL  30 mL Oral Q4H PRN Jimmy FootmanAndrea Hernandez-Gonzalez, MD      . ARIPiprazole (ABILIFY) tablet 30 mg  30 mg Oral Daily Jimmy FootmanAndrea Hernandez-Gonzalez, MD   30 mg at 03/09/16 0837  . OLANZapine (ZYPREXA) injection 10 mg  10 mg Intramuscular Daily Jimmy FootmanAndrea Hernandez-Gonzalez, MD       Or  . ARIPiprazole (ABILIFY) tablet 30 mg  30 mg Oral Daily Jimmy FootmanAndrea Hernandez-Gonzalez, MD   30 mg at 03/10/16 0846  . feeding supplement (ENSURE ENLIVE) (ENSURE ENLIVE) liquid 237 mL  237 mL Oral TID BM Jimmy FootmanAndrea Hernandez-Gonzalez, MD   237 mL at 03/10/16 1000  . LORazepam (ATIVAN) tablet 2 mg  2 mg Oral TID Jimmy FootmanAndrea Hernandez-Gonzalez, MD   2 mg at 03/10/16 1157  . magnesium hydroxide (MILK OF MAGNESIA) suspension 30 mL  30 mL Oral Daily PRN Jimmy FootmanAndrea Hernandez-Gonzalez, MD      . multivitamin with minerals tablet 1 tablet  1 tablet Oral Daily Jimmy FootmanAndrea Hernandez-Gonzalez, MD   1 tablet at 03/10/16 0845  . Oxcarbazepine (TRILEPTAL) tablet 300 mg  300 mg Oral BID Jimmy FootmanAndrea Hernandez-Gonzalez, MD   300 mg at 03/10/16 [id number]    Lab Results:  No results found for this or any previous visit (from the past 48 hour(s)).  Blood Alcohol level:  Lab Results  Component Value Date   ETH <5 02/29/2016   ETH <5 08/03/2015    Metabolic Disorder Labs: Lab Results  Component Value Date   HGBA1C 5.4 03/07/2016   MPG 108 03/07/2016   MPG 117 07/27/2015   Lab Results  Component Value Date   PROLACTIN 20.7 07/27/2015   Lab Results  Component Value Date   CHOL 165 03/07/2016   TRIG 67 03/07/2016   HDL 48 03/07/2016   CHOLHDL 3.4 03/07/2016   VLDL 13 03/07/2016   LDLCALC 104 (H) 03/07/2016   LDLCALC 95 07/27/2015    Physical Findings: AIMS:  , ,  ,  ,    CIWA:    COWS:  COWS Total Score: 1  Musculoskeletal: Strength & Muscle Tone: within normal limits Gait & Station:  normal Patient leans: N/A  Psychiatric Specialty Exam: Physical Exam  Constitutional: She is oriented to person, place, and time. She appears well-developed and well-nourished.  HENT:  Head: Normocephalic and atraumatic.  Eyes: Conjunctivae and EOM are normal.  Neck: Normal range of motion.  Respiratory: Effort normal.  Musculoskeletal:  Normal range of motion.  Neurological: She is alert and oriented to person, place, and time.    Review of Systems  Unable to perform ROS: Acuity of condition    Blood pressure 118/75, pulse 87, temperature 98 F (36.7 C), resp. rate 20, height 5\' 4"  (1.626 m), weight 90.7 kg (200 lb), last menstrual period 02/27/2016, SpO2 99 %.Body mass index is 34.33 kg/m.  General Appearance: Disheveled  Eye Contact:  Minimal  Speech:  Normal Rate  Volume:  Normal  Mood:  Irritable  Affect:  Constricted  Thought Process:  Disorganized and Descriptions of Associations: Loose  Orientation:  Full (Time, Place, and Person)  Thought Content:  Delusions  Suicidal Thoughts:  No  Homicidal Thoughts:  No  Memory:  Immediate;   Poor Recent;   Poor Remote;   Poor  Judgement:  Impaired  Insight:  Lacking  Psychomotor Activity:  Decreased  Concentration:  Concentration: Poor and Attention Span: Poor  Recall:  Poor  Fund of Knowledge:  Poor  Language:  Fair  Akathisia:  No  Handed:    AIMS (if indicated):     Assets:  Communication Skills Leisure Time  ADL's:  Intact  Cognition:  Impaired,  Mild  Sleep:  Number of Hours: 4.5     Treatment Plan Summary:  No improvement since admission. Basically nonverbal. Withdrawn to her room minimal oral intake. Only eating one meal a day and Ativan in she is only eating about 50%. She has been drinking ensures per nursing staff.  Patient is a 41 year old Caucasian female with history of schizoaffective bipolar type. He presented to the emergency department due to aggression, agitation, and psychosis. Patient has not  been compliant with medications.  For schizoaffective disorder: Patient has been lying in bed all day. She is not attending any programming. She is refusing to speak to me. She refused to speak with me and the Child psychotherapist. She has been invited to participate in treatment team twice but she has declined.  Patient has been is started on on Abilify and Trileptal 300 mg twice a day.  Plan to titrate up Abilify and offer Abilify injectable. Abilify was chosen as patient requested to take it and the patient's mother reported patient had had a good response to Abilify in the past. The patient has been taking Abilify since Jan 5th. Not much improvement and has yet been sneaking when my need to consider the possibility of switching to a different antipsychotic. Patient initially received olanzapine in these medications seemed to help her. The other possibility would be to consider Hinda Glatter (which has a long acting injectable) as the patient has history of being noncompliant.   Continue abilify to 30 mg. She refuses to take full dose of Abilify on 1/11. She was only willing to take 20 mg.Orders for Non emergency forced medications again were placed again. If patient refuses full ose of Abilify and she will receive olanzapine IM.  For possible catatonia patient started on Ativan 2 mg tid.----Appears to come out of her room more often when after she takes Ativan.  For agitation: continue Ativan 2 mg every 4 hours as needed  Patient require Manual Hold on 1/4 as she refused to leave the office where I was interviewing her on 1/4.   Ordered multivitamins  Diet regular  Vital signs daily  Continue to push fluids. Ordered ensure tid  Precautions every 15 minute checks  Hospitalization status continue involuntary commitment------ [facility] has been [facility] to [facility].  We will provide the mother with a letter recommending her to become legal guardian. Letter recommending guardian she was  mailed to the patient's mother on 1/11.   Labs: TSH, hemoglobin A1c and lipid panel, b12, BMP--has refused all labs---finally cooperative with labs on 1/10.  All wnl  Collateral information has been  obtained from the patient's mother. Patient has declined for [place] to have any contact with the mother however at this point in time she does not have capacity to make that decision. She is very psychotic. The mother will be contacted again today. We plan to discuss with her the need of guardianship in this case. This patient has refused to apply for Medicaid or disability. She isn't paranoid and unable to work. Mother feels very unsafe with her around the house as patient is noncompliant with medication. Patient displayed aggression towards the family prior to admission. They do not plan for her would be to have potentially group home placement, and act team and a guardian and I will agree for a long acting injectable.   Spoke with the patient's mother again on 1/11. We we will write a letter for deal for [place] court supporting the need for the patient to have a guardian. The letter will be given mail to his mother. She plans to go to the court house next week and apply for guardianship.   Mother will call SW for updates on Wednesday 1/17 at 2 pm.  Mother plans to take letter to court house in [place] on Tuesday of next week.  Continue psychiatric medicine for bipolar disorder. Supportive review of plan with patient even though she was not really participating with it. No other change to medicine today.  Mordecai Rasmussen, MD 03/10/2016, 3:21 PM

## 2016-03-10 NOTE — [facility] Group Notes (Signed)
[facility] Group Notes:  (Nursing/MHT/Case Management/Adjunct)  Date:  03/10/2016  Time:  5:31 PM  Type of Therapy:  Music Therapy  Participation Level:  Did Not Attend  Participation Quality:  Appropriate  Affect:  Angry and Appropriate  Cognitive:  Alert  Insight:  Appropriate  Engagement in Group:  Engaged  Modes of Intervention:  Activity  Summary of Progress/Problems:  Jessica [name]  Jessica [name] 03/10/2016, 5:31 PM

## 2016-03-10 NOTE — Plan of Care (Signed)
Problem: Role Relationship: Goal: Ability to interact with others will improve Outcome: Not Progressing Continues to isolate to her room with minimal interaction with staff/peers, keeps interactions brief with short answers to questions. Somewhat irritable.

## 2016-03-10 NOTE — Progress Notes (Signed)
Pt refusing Ativan 2mg  as ordered by mouth, see MAR. She was compliant with taking all other medications, and stated, "I want to talk to the doctor about the ativan because I don't like taking too many medications." Will inform MD. Safety maintained. Will continue to monitor.

## 2016-03-10 NOTE — [facility] Group Notes (Signed)
[facility] Group Notes:  (Nursing/MHT/Case Management/Adjunct)  Date:  03/10/2016  Time:  1:50 PM  Type of Therapy:  Music Therapy  Participation Level:  Did Not Attend  Participation Quality:  Appropriate  Affect:  Appropriate  Cognitive:  Appropriate  Insight:  Good  Engagement in Group:  Engaged  Modes of Intervention:  Activity  Summary of Progress/Problems:  Jessica [name]  Jessica [name] 03/10/2016, 1:50 PM

## 2016-03-10 NOTE — [facility] Group Notes (Signed)
[facility] LCSW Group Therapy   03/10/2016  1pm  Type of Therapy: Group Therapy   Participation Level: Did Not Attend. Patient invited to participate but declined.    Quamesha Mullet LCSW [facility]  

## 2016-03-10 NOTE — Progress Notes (Signed)
Pt reports fair sleep last night without sleep medications, poor appetite, low energy, poor concentration. Rates depression 6/10, hopelessness 4/10, anxiety 5/10 (low 0-10 high). Denies SI/HI. Will not comment on AVH. Medication compliant. Refused ativan PO as ordered this morning, but pt did take her mid day dose as ordered, see MAR. Goal today is "improve my kindness towards others by the small gestures in life." Pt continues to be isolative to room, minimal interaction with staff/peers, bizarre with behaviors. Body odor present.   Support and encouragement provided with use of therapeutic communication. Medications administered as ordered with education. Safety maintained with every 15 minute checks. Will continue to monitor.

## 2016-03-11 MED ORDER — LORAZEPAM 1 MG PO TABS
1.0000 mg | ORAL_TABLET | Freq: Two times a day (BID) | ORAL | Status: DC
  Administered 2016-03-11 – 2016-03-14 (×6): 1 mg via ORAL
  Filled 2016-03-11 (×7): qty 1

## 2016-03-11 NOTE — Plan of Care (Signed)
Problem: Health Behavior/Discharge Planning: Goal: Compliance with prescribed medication regimen will improve Outcome: Progressing Medication compliant.

## 2016-03-11 NOTE — [facility] Group Notes (Signed)
[facility] Group Notes:  (Nursing/MHT/Case Management/Adjunct)  Date:  03/11/2016  Time:  5:03 AM  Type of Therapy:  Psychoeducational Skills  Participation Level:  Did Not Attend   Summary of Progress/Problems:  Jessica [name] 03/11/2016, 5:03 AM

## 2016-03-11 NOTE — Progress Notes (Signed)
Patient noted to be very sad, flat and depressed in affect/mood. She was pleasant when she spoke, even asked when she was going to get her morning medications. During medication administration, patient swallowed pills one at a time without water, and each time she bowed down prior to swallowing the pills. Patient was given time to complete her rituals. Patient requested to have her Ativan down to twice a day due to sedation. MDOC, Dr. Toni Amendlapacs notified. New orders given; See MAR. Patient endorsed +ve AH, denied SI and contracted for safety. Patient was isolative to her room and came out only for meals and medications. Medication education done. Patient verbalized understanding.

## 2016-03-11 NOTE — Progress Notes (Signed)
D: Patient is alert and disoriented on the unit this shift having hallucinations auditory and is hyperreligious.. Patient not attended and actively participated in groups today. Patient denies suicidal ideation, homicidal ideation                                                                            A: Scheduled medications are administered to patient as per MD orders. Emotional support and encouragement are provided. Patient is maintained on q.15 minute safety checks. Patient is informed to notify staff with questions or concerns. R: No adverse medication reactions are noted. Patient is not  cooperative with medication administration and treatment plan today. Patient is non receptive, calm and non cooperative on the unit at this time. Patient does not  Interact  with others on the unit this shift. Patient contracts for safety at this time. Patient remains safe at this time.patient requires redirection at all times

## 2016-03-11 NOTE — Progress Notes (Signed)
Patient ID: Jessica [name], female   DOB: 11/11/1975, [age] y.o.   MRN: [id number]  CSW spoke with patient's mother Jessica [name] [phone] on today. Mother called CSW yesterday to inform her that there was an error in information on the letter that Dr. Ardyth HarpsHernandez wrote in support of mother filing guardianship on behalf of patient. CSW talked with Dr. Ardyth HarpsHernandez who stated she can correct the letter once she returns from vacation if it is needed. Mother states she will state the correction to judge once she has the court hearing but feels the letter will be sufficient for the case. Mother states she will contact Dr. Ardyth HarpsHernandez if she needs anything additional.   Saharah Sherrow [name] MSW, [facility] 03/11/2016 4:39 PM

## 2016-03-11 NOTE — Plan of Care (Signed)
Problem: Coping: Goal: Ability to cope will improve Outcome: Not Progressing Patient not able to cope at the present time due to hallucinations and hyperreligiousity [place] RN

## 2016-03-11 NOTE — [facility] Group Notes (Signed)
[facility] LCSW Group Therapy  03/11/2016 1 pm  Type of Therapy:  Group Therapy  Participation Level: Did not attend but was invited to attend  Yoneko Talerico M 03/11/2016, 2:23 PM  

## 2016-03-11 NOTE — Progress Notes (Signed)
[facility]  03/11/2016 2:17 PM Drucilla ChaletJennifer L Torrico  MRN:  [id number] Subjective:  Patient is a 41 year old Caucasian female with history of schizoaffective disorder bipolar type. She was brought in by police to [facility] emergency department on January 3rd. Per the emergency departments note patient's mother called EMS after the patient grabbed her arm and wouldn't let go. Upon EMS arrival the patient was standing aggressively in the doorway and saying "[place]. [place] too [place] She was uncooperative with evaluation yelling occasionally. Nurses in the ER described her as agitated and refusing to dressed out in his scrubs tangential.  It has been reported patient was noncompliant with her medications.  Per chart review she was hospitalized twice last year in behavioral health [place]. Her last admission was in June 2017. She was discharged with a diagnosis of schizoaffective disorder bipolar type. During her stay in the hospital she was placed on known emergency forced medications.  1/5 patient refused all medications yesterday. She had a Manual hold on 1/4 after she refused to leave the office where she was being interviewed. Today she has been started unknown emergency forced medications. She refuses oral olanzapine earlier this morning but took it later on. Patient has been refusing to eat or drink anything. She has been in bed all day. She refused to speak with me during assessment today her only answer was "I have no comment". She refused to open her mouth to evaluate for dehydration.  1/8 patient was more cooperative and engaging assessment today. She tells me she's been diagnosed with bipolar disorder and was prescribed with Trileptal and Abilify. She wonders if she can take these medications again as they help her. Patient tells me she has slept well over the last 2 nights. She is not having homicidal or suicidal ideation. She has been having visual  hallucinations which she sees as periods she says she does not feel threatened or scared when she sees the spirit. As far as side effects that she felt little lightheaded this morning were no other side effects. Last week she was refusing to eat now she is saying she is feels her appetite is very low but she tried to eat some last night. She tells me she's been drinking ensures and Gatorades.   She has been compliant with most medications but has refused Depakote. This morning she only took 2.5 mg of olanzapine not the whole dose order which was 10 mg.    1/9 No much improvement. She refused to speak with me today she asked me to step out of the room. Did not answer any of my questions.  Oral intake is is still poor looks like she is having only one meal per day. She has been drinking ensures and gatorades. Vital signs are within the normal limits. She complied with medications yesterday after antipsychotic was changed from olanzapine to Abilify. Per nursing notes she slept 7 hours  1/10 patient has been compliant with Abilify and Trileptal. Yesterday she took 2 doses of Ativan 2 mg which appeared effective. Patient was seen in the evening in the dayroom having dinner. He was documented the patient ate 100% of her dinner. She also cooperated with labs yesterday. Unfortunately this morning she already refused Ativan. Per nursing staff she has been is sleeping well. She continues to have poor oral intake. Usually only eating one meal per day however she has been drinking appropriately. We completed a basic metabolic panel which was normal.  Her vital signs have been normal.   patient has been uncooperative for the last 2 days. She is always lying in bed covered with blankets. She does not talk to me or open her eyes. She told me yesterday to step out of her room. Today she told me she did not wanted to talk and wanted to end the conversation. Per nursing staff she is eating 1 meal a day, about 50% of that  meeting is what she actually eats. She has been drinking fluids. She is compliant with medications. She is allowing vital signs and they have been a stable. I will order labs today.  1/11 patient refused Ativan this morning. She only took 20 mg of Abilify. She is actually prescribed with 30 mg the patient says that it was too much. She appeared a little bit improved today. She actually sat on the bed and talk to me however she is very paranoid. She refuses to answer certain questions. Eventually when encouraged to take medications she told me that I needed to go with God. Patient tells me she was planning on stopping her fasting today.  1/12 no much improvement. Patient has to be reminded about the known emergency forced medication orders for her to comply. Yesterday she had 2 meals, she ate 30% of her lunch and 90% of her dinner. Her vital signs have been within the normal limits. Patient says that she is not longer having auditory hallucinations. She tells me that her mood has improved and is now "neutral". She complains of feeling dizzy. Denies any other issues or concerns. She was only partially cooperative during assessment. She was lying in bed covered with blankets and talk to me with her eyes closed.  Per nursing: Patient took medications this morning as they were prescribed. She however complaining about any had to be reminded that if she didn't comply with oral medication she had orders for injectable medications. Staff reported that oral intake appears to be improving. Patient continues to be paranoid, bizarre and hyperreligious  Patient seen today. She was in bed and would not respond to me. I shook her quite a bit on the shoulder and it was clear to me that she was intentionally ignoring me. Eventually she did respond and said that she wanted to just sleep some more. She is staying isolated and does not engage in groups. Minimal cooperation with treatment..  Follow-up for the 14th. Patient  was still in bed but she was more easily arousable and she sat up and spoke to me. Seems a little bit disheveled and flat in her affect. Vague in her speech and not able to give a very clear indication of why she is in the hospital. Denies feeling suicidal denies having any hallucinations. Nursing notes that she appears to be oversedated from some of her medication.   Principal Problem: Schizoaffective disorder, bipolar type (HCC) Diagnosis:   Patient Active Problem List   Diagnosis Date Noted  . Schizoaffective disorder, bipolar type (HCC) [F25.0] 07/25/2015   Total Time spent with patient: 30 minutes  Past Psychiatric History: Patient has been hospitalized several times at behavioral health in [place]. She was just discharged in July 2017.  I don't have any information regarding prior suicidal attempts or self-injurious behaviors   Past Medical History:  Past Medical History:  Diagnosis Date  . Anxiety   . Bipolar 1 disorder (HCC)   . Depression   . Schizophrenia (HCC)    History reviewed. No pertinent surgical history.  Family History:  Family History  Problem Relation Age of Onset  . Hypertension Mother   . Mental illness Father    Family Psychiatric  History: Unknown at this point as patient is uncooperative   Social History: Not much information is available at this time other than she lives with her mother in [place]. 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 Marital status: Single    Spouse name: N/A  . Number of children: N/A  . Years of education: N/A   Social History Main Topics  . Smoking status: Never Smoker  . Smokeless tobacco: Never Used  . Alcohol use No  . Drug use: No  . Sexual activity: No   Other Topics Concern  . None   Social History Narrative  . None   Additional Social History:    Pain  Medications: none Prescriptions: none Over the Counter: none History of alcohol / drug use?: No history of alcohol / drug abuse Negative Consequences of Use: Personal relationships, Financial, Work / School Withdrawal Symptoms: Other (Comment)      Current Medications: Current Facility-Administered Medications  Medication Dose Route Frequency Provider Last Rate Last Dose  . acetaminophen (TYLENOL) tablet 650 mg  650 mg Oral Q6H PRN Jimmy Footman, MD      . alum & mag hydroxide-simeth (MAALOX/MYLANTA) 200-200-20 MG/5ML suspension 30 mL  30 mL Oral Q4H PRN Jimmy Footman, MD      . ARIPiprazole (ABILIFY) tablet 30 mg  30 mg Oral Daily Jimmy Footman, MD   30 mg at 03/11/16 0902  . OLANZapine (ZYPREXA) injection 10 mg  10 mg Intramuscular Daily Jimmy Footman, MD       Or  . ARIPiprazole (ABILIFY) tablet 30 mg  30 mg Oral Daily Jimmy Footman, MD   30 mg at 03/11/16 0859  . feeding supplement (ENSURE ENLIVE) (ENSURE ENLIVE) liquid 237 mL  237 mL Oral TID BM Jimmy Footman, MD   237 mL at 03/11/16 1031  . LORazepam (ATIVAN) tablet 1 mg  1 mg Oral BID Audery Amel, MD      . magnesium hydroxide (MILK OF MAGNESIA) suspension 30 mL  30 mL Oral Daily PRN Jimmy Footman, MD      . multivitamin with minerals tablet 1 tablet  1 tablet Oral Daily Jimmy Footman, MD   1 tablet at 03/11/16 0900  . Oxcarbazepine (TRILEPTAL) tablet 300 mg  300 mg Oral BID Jimmy Footman, MD   300 mg at 03/11/16 1610    Lab Results:  No results found for this or any previous visit (from the past 48 hour(s)).  Blood Alcohol level:  Lab Results  Component Value Date   [facility] <5 02/29/2016   ETH <5 08/03/2015    Metabolic Disorder Labs: Lab Results  Component Value Date   HGBA1C 5.4 03/07/2016   MPG 108 03/07/2016   MPG 117 07/27/2015   Lab Results  Component Value Date   PROLACTIN 20.7 07/27/2015   Lab  Results  Component Value Date   CHOL 165 03/07/2016   TRIG 67 03/07/2016   HDL 48 03/07/2016   CHOLHDL 3.4 03/07/2016   VLDL 13 03/07/2016   LDLCALC 104 (H) 03/07/2016   LDLCALC 95 07/27/2015    Physical Findings: AIMS:  , ,  ,  ,    CIWA:  COWS:  COWS Total Score: 1  Musculoskeletal: Strength & Muscle Tone: within normal limits Gait & Station: normal Patient leans: N/A  Psychiatric Specialty Exam: Physical Exam  Nursing note and vitals reviewed. Constitutional: She is oriented to person, place, and time. She appears well-developed and well-nourished.  HENT:  Head: Normocephalic and atraumatic.  Eyes: Conjunctivae and EOM are normal.  Neck: Normal range of motion.  Respiratory: Effort normal.  Musculoskeletal: Normal range of motion.  Neurological: She is alert and oriented to person, place, and time.    Review of Systems  Unable to perform ROS: Acuity of condition    Blood pressure 115/74, pulse 95, temperature 97.8 F (36.6 C), temperature source Oral, resp. rate 18, height 5\' 4"  (1.626 m), weight 90.7 kg (200 lb), last menstrual period 02/27/2016, SpO2 99 %.Body mass index is 34.33 kg/m.  General Appearance: Disheveled  Eye Contact:  Minimal  Speech:  Normal Rate  Volume:  Normal  Mood:  Irritable  Affect:  Constricted  Thought Process:  Disorganized and Descriptions of Associations: Loose  Orientation:  Full (Time, Place, and Person)  Thought Content:  Delusions  Suicidal Thoughts:  No  Homicidal Thoughts:  No  Memory:  Immediate;   Poor Recent;   Poor Remote;   Poor  Judgement:  Impaired  Insight:  Lacking  Psychomotor Activity:  Decreased  Concentration:  Concentration: Poor and Attention Span: Poor  Recall:  Poor  Fund of Knowledge:  Poor  Language:  Fair  Akathisia:  No  Handed:    AIMS (if indicated):     Assets:  Communication Skills Leisure Time  ADL's:  Intact  Cognition:  Impaired,  Mild  Sleep:  Number of Hours: 4.15     Treatment  Plan Summary:  No improvement since admission. Basically nonverbal. Withdrawn to her room minimal oral intake. Only eating one meal a day and Ativan in she is only eating about 50%. She has been drinking ensures per nursing staff.  Patient is a 41 year old Caucasian female with history of schizoaffective bipolar type. He presented to the emergency department due to aggression, agitation, and psychosis. Patient has not been compliant with medications.  For schizoaffective disorder: Patient has been lying in bed all day. She is not attending any programming. She is refusing to speak to me. She refused to speak with me and the Child psychotherapist. She has been invited to participate in treatment team twice but she has declined.  Patient has been is started on on Abilify and Trileptal 300 mg twice a day.  Plan to titrate up Abilify and offer Abilify injectable. Abilify was chosen as patient requested to take it and the patient's mother reported patient had had a good response to Abilify in the past. The patient has been taking Abilify since Jan 5th. Not much improvement and has yet been sneaking when my need to consider the possibility of switching to a different antipsychotic. Patient initially received olanzapine in these medications seemed to help her. The other possibility would be to consider Hinda Glatter (which has a long acting injectable) as the patient has history of being noncompliant.   Continue abilify to 30 mg. She refuses to take full dose of Abilify on 1/11. She was only willing to take 20 mg.Orders for Non emergency forced medications again were placed again. If patient refuses full ose of Abilify and she will receive olanzapine IM.  For possible catatonia patient started on Ativan 2 mg tid.----Appears to come out of her room more often  when after she takes Ativan.  For agitation: continue Ativan 2 mg every 4 hours as needed  Patient require Manual Hold on 1/4 as she refused to leave the office  where I was interviewing her on 1/4.   Ordered multivitamins  Diet regular  Vital signs daily  Continue to push fluids. Ordered ensure tid  Precautions every 15 minute checks  Hospitalization status continue involuntary commitment------ referral has been [facility] to [facility].  We will provide the mother with a letter recommending her to become legal guardian. Letter recommending guardian she was mailed to the patient's mother on 1/11.   Labs: TSH, hemoglobin A1c and lipid panel, b12, BMP--has refused all labs---finally cooperative with labs on 1/10.  All wnl  Collateral information has been  obtained from the patient's mother. Patient has declined for [place] to have any contact with the mother however at this point in time she does not have capacity to make that decision. She is very psychotic. The mother will be contacted again today. We plan to discuss with her the need of guardianship in this case. This patient has refused to apply for Medicaid or disability. She isn't paranoid and unable to work. Mother feels very unsafe with her around the house as patient is noncompliant with medication. Patient displayed aggression towards the family prior to admission. They do not plan for her would be to have potentially group home placement, and act team and a guardian and I will agree for a long acting injectable.   Spoke with the patient's mother again on 1/11. We we will write a letter for deal for [place] court supporting the need for the patient to have a guardian. The letter will be given mail to his mother. She plans to go to the court house next week and apply for guardianship.   Mother will call SW for updates on Wednesday 1/17 at 2 pm.  Mother plans to take letter to court house in [place] on Tuesday of next week.  Continue psychiatric medicine for bipolar disorder. Supportive review of plan with patient even though she was not really participating with it. No other change to  medicine today.  Patient is blunted. Doesn't involve herself much on the unit. Minimal insight. After discussion with nursing I did cut down her Ativan standing doses by quite a bit. No other change to medication. Encourage patient to get out of her room and participate.  Mordecai Rasmussen, MD 03/11/2016, 2:17 PM

## 2016-03-11 NOTE — Plan of Care (Signed)
Problem: Coping: Goal: Ability to verbalize feelings will improve Outcome: Progressing Patient verbalized feelings.    

## 2016-03-11 NOTE — Progress Notes (Signed)
D: Pt denies SI/HI/AVH, but noted responding to internal stimuli. Patient is suspicions of staff. Patient is verbally and physically aggressive during shift. In addition, patient is  intrusive, interfering in other patient treatment plan. Patient was redirected several times but was met with resistance. Pt appears anxious and suspicious of staff.   A: Pt was offered support and encouragement. Pt  Redirected for safety reasons. Q 15 minute checks were done for safety.  R:Pt eventually goes to her room, and lays on her bed  without further acting inappropriate or bothering peers.     safety maintained on unit, will continue to closely monitor.    

## 2016-03-12 NOTE — [facility] Group Notes (Signed)
[facility] Group Notes:  (Nursing/MHT/Case Management/Adjunct)  Date:  03/12/2016  Time:  5:29 PM  Type of Therapy:  Psychoeducational Skills  Participation Level:  Did Not Attend  Twanna Hymanda C Clarence Cogswell 03/12/2016, 5:29 PM

## 2016-03-12 NOTE — Progress Notes (Signed)
Pt awake, alert, oriented today. Isolates to room. Does not attend groups. Less irritable on approach from staff. No interaction noted with peers. Pleasant, calm, cooperative. Improvement noted in behaviors, pt is not as bizarre with her approach and movement. Pt is medication compliant. Denies SI/HI/AVH. Reports fair sleep last night, fair appetite, normal energy, poor concentration. Rates depression 4/10, hopelessness 4/10, 3/10 (low 0-10 high). Reports goal today is "to be able to get released and start new direction" by "my family and the [employer]."   Support and encouragement provided with use of therapeutic communication. Medications administered as ordered with education. Safety maintained with every 15 minute checks. Will continue to monitor.

## 2016-03-12 NOTE — Progress Notes (Signed)
Pt appropriately took her medications as ordered. Swallowed her pills with a sip of water.

## 2016-03-12 NOTE — Plan of Care (Signed)
Problem: Education: Goal: Knowledge of the prescribed therapeutic regimen will improve Outcome: Progressing Pt is aware of medications and schedule. Compliant with medication administration. Does not attend groups.

## 2016-03-12 NOTE — Progress Notes (Signed)
Recreation Therapy Notes  Date: 01.15.18 Time: 9:30 am Location: Craft Room  Group Topic: Self-expression  Goal Area(s) Addresses:  Patient will identify one color per emotion listed on wheel. Patient will verbalize benefit of using art as a means of self-expression. Patient will verbalize one emotion experienced during group. Patient will be educated on other forms of self-expression.  Behavioral Response: Did not attend  Intervention: Emotion Wheel  Activity: Patients were given an Arboriculturistmotion Wheel worksheet with 7 different emotions listed. Patients were instructed to pick a color for each emotion and fill it in on the worksheet.  Education: LRT educated patients on other forms of self-expression.  Education Outcome: Patient did not attend group.  Clinical Observations/Feedback: Patient did not attend group.  Jacquelynn CreeGreene,Dayvion Sans M, LRT/CTRS 03/12/2016 10:02 AM

## 2016-03-12 NOTE — Progress Notes (Signed)
[facility] MD Progress Note  03/12/2016 2:45 PM Jessica [name]  MRN:  [id number] Subjective:  Patient is a 41 year old Caucasian female with history of schizoaffective disorder bipolar type. She was brought in by police to [facility] emergency department on January 3rd. Per the emergency departments note patient's mother called EMS after the patient grabbed her arm and wouldn't let go. Upon EMS arrival the patient was standing aggressively in the doorway and saying "[place], [place]. There is too much evil in [place]". She was uncooperative with evaluation yelling occasionally. Nurses in the ER described her as agitated and refusing to dressed out in his scrubs tangential.  It has been reported patient was noncompliant with her medications.  Per chart review she was hospitalized twice last year in behavioral health [place]. Her last admission was in June 2017. She was discharged with a diagnosis of schizoaffective disorder bipolar type. During her stay in the hospital she was placed on known emergency forced medications.  1/5 patient refused all medications yesterday. She had a Manual hold on 1/4 after she refused to leave the office where she was being interviewed. Today she has been started unknown emergency forced medications. She refuses oral olanzapine earlier this morning but took it later on. Patient has been refusing to eat or drink anything. She has been in bed all day. She refused to speak with me during assessment today her only answer was "I have no comment". She refused to open her mouth to evaluate for dehydration.  1/8 patient was more cooperative and engaging assessment today. She tells me she's been diagnosed with bipolar disorder and was prescribed with Trileptal and Abilify. She wonders if she can take these medications again as they help her. Patient tells me she has slept well over the last 2 nights. She is not having homicidal or suicidal ideation. She has been having visual  hallucinations which she sees as periods she says she does not feel threatened or scared when she sees the spirit. As far as side effects that she felt little lightheaded this morning were no other side effects. Last week she was refusing to eat now she is saying she is feels her appetite is very low but she tried to eat some last night. She tells me she's been drinking ensures and Gatorades.   She has been compliant with most medications but has refused Depakote. This morning she only took 2.5 mg of olanzapine not the whole dose order which was 10 mg.    1/9 No much improvement. She refused to speak with me today she asked me to step out of the room. Did not answer any of my questions.  Oral intake is is still poor looks like she is having only one meal per day. She has been drinking ensures and gatorades. Vital signs are within the normal limits. She complied with medications yesterday after antipsychotic was changed from olanzapine to Abilify. Per nursing notes she slept 7 hours  1/10 patient has been compliant with Abilify and Trileptal. Yesterday she took 2 doses of Ativan 2 mg which appeared effective. Patient was seen in the evening in the dayroom having dinner. He was documented the patient ate 100% of her dinner. She also cooperated with labs yesterday. Unfortunately this morning she already refused Ativan. Per nursing staff she has been is sleeping well. She continues to have poor oral intake. Usually only eating one meal per day however she has been drinking appropriately. We completed a basic metabolic panel which was normal.  Her vital signs have been normal.   patient has been uncooperative for the last 2 days. She is always lying in bed covered with blankets. She does not talk to me or open her eyes. She told me yesterday to step out of her room. Today she told me she did not wanted to talk and wanted to end the conversation. Per nursing staff she is eating 1 meal a day, about 50% of that  meeting is what she actually eats. She has been drinking fluids. She is compliant with medications. She is allowing vital signs and they have been a stable. I will order labs today.  1/11 patient refused Ativan this morning. She only took 20 mg of Abilify. She is actually prescribed with 30 mg the patient says that it was too much. She appeared a little bit improved today. She actually sat on the bed and talk to me however she is very paranoid. She refuses to answer certain questions. Eventually when encouraged to take medications she told me that I needed to go with God. Patient tells me she was planning on stopping her fasting today.  1/12 no much improvement. Patient has to be reminded about the known emergency forced medication orders for her to comply. Yesterday she had 2 meals, she ate 30% of her lunch and 90% of her dinner. Her vital signs have been within the normal limits. Patient says that she is not longer having auditory hallucinations. She tells me that her mood has improved and is now "neutral". She complains of feeling dizzy. Denies any other issues or concerns. She was only partially cooperative during assessment. She was lying in bed covered with blankets and talk to me with her eyes closed.  Per nursing: Patient took medications this morning as they were prescribed. She however complaining about any had to be reminded that if she didn't comply with oral medication she had orders for injectable medications. Staff reported that oral intake appears to be improving. Patient continues to be paranoid, bizarre and hyperreligious  Patient seen today. She was in bed and would not respond to me. I shook her quite a bit on the shoulder and it was clear to me that she was intentionally ignoring me. Eventually she did respond and said that she wanted to just sleep some more. She is staying isolated and does not engage in groups. Minimal cooperation with treatment..  Follow-up for the 14th. Patient  was still in bed but she was more easily arousable and she sat up and spoke to me. Seems a little bit disheveled and flat in her affect. Vague in her speech and not able to give a very clear indication of why she is in the hospital. Denies feeling suicidal denies having any hallucinations. Nursing notes that she appears to be oversedated from some of her medication.  03/12/2016. Jessica [name] is in bed after lunch. She does not open her eyes for me. She feels "so, so" and "does not know yet" what is wrong. She accepts medications. No group participation. No somatic complaints.  Per nursing: D: Patient is alert and disoriented on the unit this shift having hallucinations auditory and is hyperreligious.. Patient not attended and actively participated in groups today. Patient denies suicidal ideation, homicidal ideation  A: Scheduled medications are administered to patient as per MD orders. Emotional support and encouragement are provided. Patient is maintained on q.15 minute safety checks. Patient is informed to notify staff with questions or concerns. R: No adverse medication reactions are noted. Patient is not  cooperative with medication administration and treatment plan today. Patient is non receptive, calm and non cooperative on the unit at this time. Patient does not  Interact  with others on the unit this shift. Patient contracts for safety at this time. Patient remains safe at this time.patient requires redirection at all times     Principal Problem: Schizoaffective disorder, bipolar type [facility]) Diagnosis:   Patient Active Problem List   Diagnosis Date Noted  . Schizoaffective disorder, bipolar type (HCC) [F25.0] 07/25/2015   Total Time spent with patient: 20 minutes  Past Psychiatric History: Patient has been hospitalized several times at behavioral health in [place]. She was just discharged in July 2017.  I don't  have any information regarding prior suicidal attempts or self-injurious behaviors   Past Medical History:  Past Medical History:  Diagnosis Date  . Anxiety   . Bipolar 1 disorder (HCC)   . Depression   . Schizophrenia (HCC)    History reviewed. No pertinent surgical history.  Family History:  Family History  Problem Relation Age of Onset  . Hypertension Mother   . Mental illness Father    Family Psychiatric  History: Unknown at this point as patient is uncooperative   Social History: Not much information is available at this time other than she lives with her mother in [place]. 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 Marital status: Single    Spouse name: N/A  . Number of children: N/A  . Years of education: N/A   Social History Main Topics  . Smoking status: Never Smoker  . Smokeless tobacco: Never Used  . Alcohol use No  . Drug use: No  . Sexual activity: No   Other Topics Concern  . None   Social History Narrative  . None   Additional Social History:    Pain Medications: none Prescriptions: none Over the Counter: none History of alcohol / drug use?: No history of alcohol / drug abuse Negative Consequences of Use: Personal relationships, Financial, Work / School Withdrawal Symptoms: Other (Comment)      Current Medications: Current Facility-Administered Medications  Medication Dose Route Frequency Provider Last Rate Last Dose  . acetaminophen (TYLENOL) tablet 650 mg  650 mg Oral Q6H PRN Jimmy Footman, MD      . alum & mag hydroxide-simeth (MAALOX/MYLANTA) 200-200-20 MG/5ML suspension 30 mL  30 mL Oral Q4H PRN Jimmy Footman, MD      . ARIPiprazole (ABILIFY) tablet 30 mg  30 mg Oral Daily Jimmy Footman, MD   30 mg at 03/11/16 0902  . OLANZapine (ZYPREXA) injection 10 mg  10 mg  Intramuscular Daily Jimmy Footman, MD       Or  . ARIPiprazole (ABILIFY) tablet 30 mg  30 mg Oral Daily Jimmy Footman, MD   30 mg at 03/12/16 0810  . feeding supplement (ENSURE ENLIVE) (ENSURE ENLIVE) liquid 237 mL  237 mL Oral TID BM Jimmy Footman, MD   237 mL at 03/12/16 1400  . LORazepam (ATIVAN) tablet 1 mg  1 mg Oral BID Jonny Ruiz T  Clapacs, MD   1 mg at 03/12/16 0810  . magnesium hydroxide (MILK OF MAGNESIA) suspension 30 mL  30 mL Oral Daily PRN Jimmy FootmanAndrea Hernandez-Gonzalez, MD      . multivitamin with minerals tablet 1 tablet  1 tablet Oral Daily Jimmy FootmanAndrea Hernandez-Gonzalez, MD   1 tablet at 03/12/16 0810  . Oxcarbazepine (TRILEPTAL) tablet 300 mg  300 mg Oral BID Jimmy FootmanAndrea Hernandez-Gonzalez, MD   300 mg at 03/12/16 [id number]    Lab Results:  No results found for this or any previous visit (from the past 48 hour(s)).  Blood Alcohol level:  Lab Results  Component Value Date   ETH <5 02/29/2016   ETH <5 08/03/2015    Metabolic Disorder Labs: Lab Results  Component Value Date   HGBA1C 5.4 03/07/2016   MPG 108 03/07/2016   MPG 117 07/27/2015   Lab Results  Component Value Date   PROLACTIN 20.7 07/27/2015   Lab Results  Component Value Date   CHOL 165 03/07/2016   TRIG 67 03/07/2016   HDL 48 03/07/2016   CHOLHDL 3.4 03/07/2016   VLDL 13 03/07/2016   LDLCALC 104 (H) 03/07/2016   LDLCALC 95 07/27/2015    Physical Findings: AIMS:  , ,  ,  ,    CIWA:    COWS:  COWS Total Score: 1  Musculoskeletal: Strength & Muscle Tone: within normal limits Gait & Station: normal Patient leans: N/A  Psychiatric Specialty Exam: Physical Exam  Nursing note and vitals reviewed. Constitutional: She is oriented to person, place, and time. She appears well-developed and well-nourished.  HENT:  Head: Normocephalic and atraumatic.  Eyes: Conjunctivae and EOM are normal.  Neck: Normal range of motion.  Respiratory: Effort normal.  Musculoskeletal: Normal range of  motion.  Neurological: She is alert and oriented to person, place, and time.    Review of Systems  Unable to perform ROS: Acuity of condition  Psychiatric/Behavioral: Positive for hallucinations.  All other systems reviewed and are negative.   Blood pressure 122/73, pulse 87, temperature 98.8 F (37.1 C), temperature source Oral, resp. rate 20, height 5\' 4"  (1.626 m), weight 90.7 kg (200 lb), last menstrual period 02/27/2016, SpO2 99 %.Body mass index is 34.33 kg/m.  General Appearance: Disheveled  Eye Contact:  Minimal  Speech:  Normal Rate  Volume:  Normal  Mood:  Irritable  Affect:  Constricted  Thought Process:  Disorganized and Descriptions of Associations: Loose  Orientation:  Full (Time, Place, and Person)  Thought Content:  Delusions  Suicidal Thoughts:  No  Homicidal Thoughts:  No  Memory:  Immediate;   Poor Recent;   Poor Remote;   Poor  Judgement:  Impaired  Insight:  Lacking  Psychomotor Activity:  Decreased  Concentration:  Concentration: Poor and Attention Span: Poor  Recall:  Poor  Fund of Knowledge:  Poor  Language:  Fair  Akathisia:  No  Handed:    AIMS (if indicated):     Assets:  Communication Skills Leisure Time  ADL's:  Intact  Cognition:  Impaired,  Mild  Sleep:  Number of Hours: 6     Treatment Plan Summary:  No improvement since admission. Basically nonverbal. Withdrawn to her room minimal oral intake. Only eating one meal a day and Ativan in she is only eating about 50%. She has been drinking ensures per nursing staff.  Patient is a 41 year old Caucasian female with history of schizoaffective bipolar type. He presented to the emergency department due to aggression, agitation, and psychosis. Patient has  not been compliant with medications.  For schizoaffective disorder: Patient has been lying in bed all day. She is not attending any programming. She is refusing to speak to me. She refused to speak with me and the Child psychotherapist. She has been  invited to participate in treatment team twice but she has declined.  Patient has been is started on on Abilify and Trileptal 300 mg twice a day.  Plan to titrate up Abilify and offer Abilify injectable. Abilify was chosen as patient requested to take it and the patient's mother reported patient had had a good response to Abilify in the past. The patient has been taking Abilify since Jan 5th. Not much improvement and has yet been sneaking when my need to consider the possibility of switching to a different antipsychotic. Patient initially received olanzapine in these medications seemed to help her. The other possibility would be to consider Hinda Glatter (which has a long acting injectable) as the patient has history of being noncompliant.   Continue abilify to 30 mg. She refuses to take full dose of Abilify on 1/11. She was only willing to take 20 mg.Orders for Non emergency forced medications again were placed again. If patient refuses full ose of Abilify and she will receive olanzapine IM.  For possible catatonia patient started on Ativan 2 mg tid.----Appears to come out of her room more often when after she takes Ativan.  For agitation: continue Ativan 2 mg every 4 hours as needed  Patient require Manual Hold on 1/4 as she refused to leave the office where I was interviewing her on 1/4.   Ordered multivitamins  Diet regular  Vital signs daily  Continue to push fluids. Ordered ensure tid  Precautions every 15 minute checks  Hospitalization status continue involuntary commitment------ [facility] has been [facility] to [facility].  We will provide the mother with a letter recommending her to become legal guardian. Letter recommending guardian she was mailed to the patient's mother on 1/11.   Labs: TSH, hemoglobin A1c and lipid panel, b12, BMP--has refused all labs---finally cooperative with labs on 1/10.  All wnl  Collateral information has been  obtained from the patient's mother.  Patient has declined for [place] to have any contact with the mother however at this point in time she does not have capacity to make that decision. She is very psychotic. The mother will be contacted again today. We plan to discuss with her the need of guardianship in this case. This patient has refused to apply for Medicaid or disability. She isn't paranoid and unable to work. Mother feels very unsafe with her around the house as patient is noncompliant with medication. Patient displayed aggression towards the family prior to admission. They do not plan for her would be to have potentially group home placement, and act team and a guardian and I will agree for a long acting injectable.   Spoke with the patient's mother again on 1/11. We we will write a letter for deal for [place] court supporting the need for the patient to have a guardian. The letter will be given mail to his mother. She plans to go to the court house next week and apply for guardianship.   Mother will call SW for updates on Wednesday 1/17 at 2 pm.  Mother plans to take letter to court house in [place] on Tuesday of next week.  Continue psychiatric medicine for bipolar disorder. Supportive review of plan with patient even though she was not really participating with it.  No other change to medicine today.  Patient is blunted. Doesn't involve herself much on the unit. Minimal insight. After discussion with nursing I did cut down her Ativan standing doses by quite a bit. No other change to medication. Encourage patient to get out of her room and participate.  03/12/2016 No medication changes.  Kristine Linea, MD 03/12/2016, 2:45 PM

## 2016-03-13 NOTE — [facility] Group Notes (Signed)
[facility] Group Notes:  (Nursing/MHT/Case Management/Adjunct)  Date:  03/13/2016  Time:  9:56 PM  Type of Therapy:  Psychoeducational Skills  Participation Level:  Did Not Attend  Participation Quality:  Summary of Progress/Problems:  Jessica NeerJackie L Zoran Yankee 03/13/2016, 9:56 PM

## 2016-03-13 NOTE — Progress Notes (Addendum)
[facility] MD Progress Note  03/13/2016 5:28 PM Jessica [name]  MRN:  [id number] Subjective:  Patient is a 41 year old Caucasian female with history of schizoaffective disorder bipolar type. She was brought in by police to [facility] emergency department on January 3rd. Per the emergency departments note patient's mother called EMS after the patient grabbed her arm and wouldn't let go. Upon EMS arrival the patient was standing aggressively in the doorway and saying "[place], [place]. There is too much evil in [place]". She was uncooperative with evaluation yelling occasionally. Nurses in the ER described her as agitated and refusing to dressed out in his scrubs tangential.  It has been reported patient was noncompliant with her medications.  Per chart review she was hospitalized twice last year in behavioral health [place]. Her last admission was in June 2017. She was discharged with a diagnosis of schizoaffective disorder bipolar type. During her stay in the hospital she was placed on known emergency forced medications.  1/5 patient refused all medications yesterday. She had a Manual hold on 1/4 after she refused to leave the office where she was being interviewed. Today she has been started unknown emergency forced medications. She refuses oral olanzapine earlier this morning but took it later on. Patient has been refusing to eat or drink anything. She has been in bed all day. She refused to speak with me during assessment today her only answer was "I have no comment". She refused to open her mouth to evaluate for dehydration.  1/8 patient was more cooperative and engaging assessment today. She tells me she's been diagnosed with bipolar disorder and was prescribed with Trileptal and Abilify. She wonders if she can take these medications again as they help her. Patient tells me she has slept well over the last 2 nights. She is not having homicidal or suicidal ideation. She has been having visual  hallucinations which she sees as periods she says she does not feel threatened or scared when she sees the spirit. As far as side effects that she felt little lightheaded this morning were no other side effects. Last week she was refusing to eat now she is saying she is feels her appetite is very low but she tried to eat some last night. She tells me she's been drinking ensures and Gatorades.   She has been compliant with most medications but has refused Depakote. This morning she only took 2.5 mg of olanzapine not the whole dose order which was 10 mg.    1/9 No much improvement. She refused to speak with me today she asked me to step out of the room. Did not answer any of my questions.  Oral intake is is still poor looks like she is having only one meal per day. She has been drinking ensures and gatorades. Vital signs are within the normal limits. She complied with medications yesterday after antipsychotic was changed from olanzapine to Abilify. Per nursing notes she slept 7 hours  1/10 patient has been compliant with Abilify and Trileptal. Yesterday she took 2 doses of Ativan 2 mg which appeared effective. Patient was seen in the evening in the dayroom having dinner. He was documented the patient ate 100% of her dinner. She also cooperated with labs yesterday. Unfortunately this morning she already refused Ativan. Per nursing staff she has been is sleeping well. She continues to have poor oral intake. Usually only eating one meal per day however she has been drinking appropriately. We completed a basic metabolic panel which was normal.  Her vital signs have been normal.   patient has been uncooperative for the last 2 days. She is always lying in bed covered with blankets. She does not talk to me or open her eyes. She told me yesterday to step out of her room. Today she told me she did not wanted to talk and wanted to end the conversation. Per nursing staff she is eating 1 meal a day, about 50% of that  meeting is what she actually eats. She has been drinking fluids. She is compliant with medications. She is allowing vital signs and they have been a stable. I will order labs today.  1/11 patient refused Ativan this morning. She only took 20 mg of Abilify. She is actually prescribed with 30 mg the patient says that it was too much. She appeared a little bit improved today. She actually sat on the bed and talk to me however she is very paranoid. She refuses to answer certain questions. Eventually when encouraged to take medications she told me that I needed to go with God. Patient tells me she was planning on stopping her fasting today.  1/12 no much improvement. Patient has to be reminded about the known emergency forced medication orders for her to comply. Yesterday she had 2 meals, she ate 30% of her lunch and 90% of her dinner. Her vital signs have been within the normal limits. Patient says that she is not longer having auditory hallucinations. She tells me that her mood has improved and is now "neutral". She complains of feeling dizzy. Denies any other issues or concerns. She was only partially cooperative during assessment. She was lying in bed covered with blankets and talk to me with her eyes closed.  Per nursing: Patient took medications this morning as they were prescribed. She however complaining about any had to be reminded that if she didn't comply with oral medication she had orders for injectable medications. Staff reported that oral intake appears to be improving. Patient continues to be paranoid, bizarre and hyperreligious  Patient seen today. She was in bed and would not respond to me. I shook her quite a bit on the shoulder and it was clear to me that she was intentionally ignoring me. Eventually she did respond and said that she wanted to just sleep some more. She is staying isolated and does not engage in groups. Minimal cooperation with treatment..  Follow-up for the 14th. Patient  was still in bed but she was more easily arousable and she sat up and spoke to me. Seems a little bit disheveled and flat in her affect. Vague in her speech and not able to give a very clear indication of why she is in the hospital. Denies feeling suicidal denies having any hallucinations. Nursing notes that she appears to be oversedated from some of her medication.  03/12/2016. Ms. Harjo is in bed after lunch. She does not open her eyes for me. She feels "so, so" and "does not know yet" what is wrong. She accepts medications. No group participation. No somatic complaints.   03/13/2016. There is absolutely no change since yesterday. Continue current treatment.  Per nursing: D: Pt denies SI/HI/AVH. Pt is pleasant and cooperative, affect is flat and sad. Pt  Is not responding to internal stimuli, no bizarre noted. Patient appears less anxious. Patient is not interacting with peers and staff appropriately.  A: Pt was offered support and encouragement. Pt was given scheduled medications. Pt was encouraged to attend groups. Q 15 minute checks  were done for safety.  R:Pt did not attend group. Pt is taking medication. Pt has no complaints.Pt receptive to treatment and safety maintained on unit.  Principal Problem: Schizoaffective disorder, bipolar type (HCC) Diagnosis:   Patient Active Problem List   Diagnosis Date Noted  . Schizoaffective disorder, bipolar type (HCC) [F25.0] 07/25/2015   Total Time spent with patient: 20 minutes  Past Psychiatric History: Patient has been hospitalized several times at behavioral health in [place]. She was just discharged in July 2017.  I don't have any information regarding prior suicidal attempts or self-injurious behaviors   Past Medical History:  Past Medical History:  Diagnosis Date  . Anxiety   . Bipolar 1 disorder (HCC)   . Depression   . Schizophrenia (HCC)    History reviewed. No pertinent surgical history.  Family History:  Family History   Problem Relation Age of Onset  . Hypertension Mother   . Mental illness Father    Family Psychiatric  History: Unknown at this point as patient is uncooperative   Social History: Not much information is available at this time other than she lives with her mother in [place]. 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 Marital status: Single    Spouse name: N/A  . Number of children: N/A  . Years of education: N/A   Social History Main Topics  . Smoking status: Never Smoker  . Smokeless tobacco: Never Used  . Alcohol use No  . Drug use: No  . Sexual activity: No   Other Topics Concern  . None   Social History Narrative  . None   Additional Social History:    Pain Medications: none Prescriptions: none Over the Counter: none History of alcohol / drug use?: No history of alcohol / drug abuse Negative Consequences of Use: Personal relationships, Financial, Work / School Withdrawal Symptoms: Other (Comment)      Current Medications: Current Facility-Administered Medications  Medication Dose Route Frequency Provider Last Rate Last Dose  . acetaminophen (TYLENOL) tablet 650 mg  650 mg Oral Q6H PRN Jimmy FootmanAndrea Hernandez-Gonzalez, MD      . alum & mag hydroxide-simeth (MAALOX/MYLANTA) 200-200-20 MG/5ML suspension 30 mL  30 mL Oral Q4H PRN Jimmy FootmanAndrea Hernandez-Gonzalez, MD      . ARIPiprazole (ABILIFY) tablet 30 mg  30 mg Oral Daily Jimmy FootmanAndrea Hernandez-Gonzalez, MD   30 mg at 03/11/16 0902  . OLANZapine (ZYPREXA) injection 10 mg  10 mg Intramuscular Daily Jimmy FootmanAndrea Hernandez-Gonzalez, MD       Or  . ARIPiprazole (ABILIFY) tablet 30 mg  30 mg Oral Daily Jimmy FootmanAndrea Hernandez-Gonzalez, MD   30 mg at 03/13/16 0806  . feeding supplement (ENSURE ENLIVE) (ENSURE ENLIVE) liquid 237 mL  237 mL Oral TID BM Jimmy FootmanAndrea Hernandez-Gonzalez, MD   237 mL at 03/13/16 1400  . LORazepam  (ATIVAN) tablet 1 mg  1 mg Oral BID Audery AmelJohn T Clapacs, MD   1 mg at 03/13/16 [id number]  . magnesium hydroxide (MILK OF MAGNESIA) suspension 30 mL  30 mL Oral Daily PRN Jimmy FootmanAndrea Hernandez-Gonzalez, MD      . multivitamin with minerals tablet 1 tablet  1 tablet Oral Daily Jimmy FootmanAndrea Hernandez-Gonzalez, MD   1 tablet at 03/13/16 0806  . Oxcarbazepine (TRILEPTAL) tablet 300 mg  300 mg Oral BID Jimmy FootmanAndrea Hernandez-Gonzalez, MD   300 mg at 03/13/16  1610    Lab Results:  No results found for this or any previous visit (from the past 48 hour(s)).  Blood Alcohol level:  Lab Results  Component Value Date   ETH <5 02/29/2016   ETH <5 08/03/2015    Metabolic Disorder Labs: Lab Results  Component Value Date   HGBA1C 5.4 03/07/2016   MPG 108 03/07/2016   MPG 117 07/27/2015   Lab Results  Component Value Date   PROLACTIN 20.7 07/27/2015   Lab Results  Component Value Date   CHOL 165 03/07/2016   TRIG 67 03/07/2016   HDL 48 03/07/2016   CHOLHDL 3.4 03/07/2016   VLDL 13 03/07/2016   LDLCALC 104 (H) 03/07/2016   LDLCALC 95 07/27/2015    Physical Findings: AIMS:  , ,  ,  ,    CIWA:    COWS:  COWS Total Score: 1  Musculoskeletal: Strength & Muscle Tone: within normal limits Gait & Station: normal Patient leans: N/A  Psychiatric Specialty Exam: Physical Exam  Nursing note and vitals reviewed. Constitutional: She is oriented to person, place, and time. She appears well-developed and well-nourished.  HENT:  Head: Normocephalic and atraumatic.  Eyes: Conjunctivae and EOM are normal.  Neck: Normal range of motion.  Respiratory: Effort normal.  Musculoskeletal: Normal range of motion.  Neurological: She is alert and oriented to person, place, and time.    Review of Systems  Unable to perform ROS: Acuity of condition  Psychiatric/Behavioral: Positive for hallucinations.  All other systems reviewed and are negative.   Blood pressure 125/73, pulse 97, temperature 98.7 F (37.1 C), temperature  source Oral, resp. rate 20, height 5\' 4"  (1.626 m), weight 90.7 kg (200 lb), last menstrual period 02/27/2016, SpO2 99 %.Body mass index is 34.33 kg/m.  General Appearance: Disheveled  Eye Contact:  Minimal  Speech:  Normal Rate  Volume:  Normal  Mood:  Irritable  Affect:  Constricted  Thought Process:  Disorganized and Descriptions of Associations: Loose  Orientation:  Full (Time, Place, and Person)  Thought Content:  Delusions  Suicidal Thoughts:  No  Homicidal Thoughts:  No  Memory:  Immediate;   Poor Recent;   Poor Remote;   Poor  Judgement:  Impaired  Insight:  Lacking  Psychomotor Activity:  Decreased  Concentration:  Concentration: Poor and Attention Span: Poor  Recall:  Poor  Fund of Knowledge:  Poor  Language:  Fair  Akathisia:  No  Handed:    AIMS (if indicated):     Assets:  Communication Skills Leisure Time  ADL's:  Intact  Cognition:  Impaired,  Mild  Sleep:  Number of Hours: 7.5     Treatment Plan Summary:  No improvement since admission. Basically nonverbal. Withdrawn to her room minimal oral intake. Only eating one meal a day and Ativan in she is only eating about 50%. She has been drinking ensures per nursing staff.  Patient is a 41 year old Caucasian female with history of schizoaffective bipolar type. He presented to the emergency department due to aggression, agitation, and psychosis. Patient has not been compliant with medications.  For schizoaffective disorder: Patient has been lying in bed all day. She is not attending any programming. She is refusing to speak to me. She refused to speak with me and the Child psychotherapist. She has been invited to participate in treatment team twice but she has declined.  Patient has been is started on on Abilify and Trileptal 300 mg twice a day.  Plan to titrate up Abilify  and offer Abilify injectable. Abilify was chosen as patient requested to take it and the patient's mother reported patient had had a good response to  Abilify in the past. The patient has been taking Abilify since Jan 5th. Not much improvement and has yet been sneaking when my need to consider the possibility of switching to a different antipsychotic. Patient initially received olanzapine in these medications seemed to help her. The other possibility would be to consider Hinda Glatter (which has a long acting injectable) as the patient has history of being noncompliant.   Continue abilify to 30 mg. She refuses to take full dose of Abilify on 1/11. She was only willing to take 20 mg.Orders for Non emergency forced medications again were placed again. If patient refuses full ose of Abilify and she will receive olanzapine IM.  For possible catatonia patient started on Ativan 2 mg tid.----Appears to come out of her room more often when after she takes Ativan.  For agitation: continue Ativan 2 mg every 4 hours as needed  Patient require Manual Hold on 1/4 as she refused to leave the office where I was interviewing her on 1/4.   Ordered multivitamins  Diet regular  Vital signs daily  Continue to push fluids. Ordered ensure tid  Precautions every 15 minute checks  Hospitalization status continue involuntary commitment------ referral has been made to [facility].  We will provide the mother with a letter recommending her to become legal guardian. Letter recommending guardian she was mailed to the patient's mother on 1/11.   Labs: TSH, hemoglobin A1c and lipid panel, b12, BMP--has refused all labs---finally cooperative with labs on 1/10.  All wnl  Collateral information has been  obtained from the patient's mother. Patient has declined for [place] to have any contact with the mother however at this point in time she does not have capacity to make that decision. She is very psychotic. The mother will be contacted again today. We plan to discuss with her the need of guardianship in this case. This patient has refused to apply for Medicaid or  disability. She isn't paranoid and unable to work. Mother feels very unsafe with her around the house as patient is noncompliant with medication. Patient displayed aggression towards the family prior to admission. They do not plan for her would be to have potentially group home placement, and act team and a guardian and I will agree for a long acting injectable.   Spoke with the patient's mother again on 1/11. We we will write a letter for deal for [place] court supporting the need for the patient to have a guardian. The letter will be given mail to his mother. She plans to go to the court house next week and apply for guardianship.   Mother will call SW for updates on Wednesday 1/17 at 2 pm.  Mother plans to take letter to court house in [place] on Tuesday of next week.  Continue psychiatric medicine for bipolar disorder. Supportive review of plan with patient even though she was not really participating with it. No other change to medicine today.  Patient is blunted. Doesn't involve herself much on the unit. Minimal insight. After discussion with nursing I did cut down her Ativan standing doses by quite a bit. No other change to medication. Encourage patient to get out of her room and participate.  03/12/2016 No medication changes  03/13/2016 No medication changes.  I certify that the services received since the previous certification/recertification were and continue to be  medically necessary as the treatment provided can be reasonably expected to improve the patient's condition; the medical record documents that the services furnished were intensive treatment services or their equivalent services, and this patient continues to need, on a daily basis, active treatment furnished directly by or requiring the supervision of inpatient psychiatric personnel. Kristine Linea, MD 03/13/2016, 5:28 PM

## 2016-03-13 NOTE — Progress Notes (Signed)
D: Pt denies SI/HI/AVH. Pt is pleasant and cooperative. Pt has isolated to the unit this evening.   A: Pt was offered support and encouragement.  Pt was encourage to attend groups. Q 15 minute checks were done for safety.   R: safety maintained on unit.

## 2016-03-13 NOTE — Plan of Care (Signed)
Problem: Safety: Goal: Ability to remain free from injury will improve Outcome: Progressing Pt safe on the unit at this time   

## 2016-03-13 NOTE — Progress Notes (Signed)
Patient denies suicidal or homicidal ideations & AV hallucinations.Stayed in bed most of the shift.Continues to be guarded.States "I am fine."Did not attend groups.Compliant with medications.Appetite & energy level fair.Support & encouragement given.

## 2016-03-13 NOTE — Progress Notes (Signed)
D: Pt denies SI/HI/AVH. Pt is pleasant and cooperative, affect is flat and sad. Pt  Is not responding to internal stimuli, no bizarre noted. Patient appears less anxious. Patient is not interacting with peers and staff appropriately.  A: Pt was offered support and encouragement. Pt was given scheduled medications. Pt was encouraged to attend groups. Q 15 minute checks were done for safety.  R:Pt did not attend group. Pt is taking medication. Pt has no complaints.Pt receptive to treatment and safety maintained on unit.

## 2016-03-13 NOTE — Social Work (Signed)
CSW spoke with patient's mother, Theora MasterLinda Reeves @ [phone] and informed Ms. Anise Salvoeeves that Dr. Ardyth HarpsHernandez has sent a letter for guardianship and the next steps that are needed. Ms. Theora MasterLinda Reeves is aware and plans to meet with lawyer on behalf of pt's case.    Hampton AbbotKadijah Saanvika Vazques, MSW, LCSW-A 03/13/2016, 11:22AM

## 2016-03-14 MED ORDER — HALOPERIDOL 5 MG PO TABS
5.0000 mg | ORAL_TABLET | Freq: Two times a day (BID) | ORAL | Status: DC
  Administered 2016-03-15 – 2016-03-19 (×9): 5 mg via ORAL
  Filled 2016-03-14 (×9): qty 1

## 2016-03-14 NOTE — Tx Team (Signed)
Interdisciplinary Treatment and Diagnostic Plan Update  03/14/2016 Time of Session: 10:30 AM Jessica [name] MRN: [id number]  Principal Diagnosis: Schizoaffective disorder, bipolar type [facility])  Secondary Diagnoses: Principal Problem:   Schizoaffective disorder, bipolar type (HCC)   Current Medications:  Current Facility-Administered Medications  Medication Dose Route Frequency Provider Last Rate Last Dose  . acetaminophen (TYLENOL) tablet 650 mg  650 mg Oral Q6H PRN Jimmy Footman, MD      . alum & mag hydroxide-simeth (MAALOX/MYLANTA) 200-200-20 MG/5ML suspension 30 mL  30 mL Oral Q4H PRN Jimmy Footman, MD      . ARIPiprazole (ABILIFY) tablet 30 mg  30 mg Oral Daily Jimmy Footman, MD   30 mg at 03/14/16 0807  . OLANZapine (ZYPREXA) injection 10 mg  10 mg Intramuscular Daily Jimmy Footman, MD       Or  . ARIPiprazole (ABILIFY) tablet 30 mg  30 mg Oral Daily Jimmy Footman, MD   30 mg at 03/14/16 0802  . feeding supplement (ENSURE ENLIVE) (ENSURE ENLIVE) liquid 237 mL  237 mL Oral TID BM Jimmy Footman, MD   237 mL at 03/14/16 1000  . LORazepam (ATIVAN) tablet 1 mg  1 mg Oral BID Audery Amel, MD   1 mg at 03/13/16 1757  . magnesium hydroxide (MILK OF MAGNESIA) suspension 30 mL  30 mL Oral Daily PRN Jimmy Footman, MD      . multivitamin with minerals tablet 1 tablet  1 tablet Oral Daily Jimmy Footman, MD   1 tablet at 03/14/16 0801  . Oxcarbazepine (TRILEPTAL) tablet 300 mg  300 mg Oral BID Jimmy Footman, MD   300 mg at 03/14/16 0801   PTA Medications: Prescriptions Prior to Admission  Medication Sig Dispense Refill Last Dose  . ARIPiprazole (ABILIFY) 5 MG tablet Take 5 mg by mouth daily.   02/29/2016 at Unknown time  . doxepin (SINEQUAN) 10 MG capsule Take 2 capsules (20 mg total) by mouth at bedtime as needed (sleep). 30 capsule 0 unknown  . hydrOXYzine (ATARAX/VISTARIL) 25 MG  tablet Take 1 tablet (25 mg total) by mouth every 6 (six) hours as needed for anxiety. 30 tablet 0 unknown  . OLANZapine (ZYPREXA) 2.5 MG tablet Take 5 tablets (12.5 mg total) by mouth at bedtime. (Patient not taking: Reported on 02/29/2016) 30 tablet 0 Not Taking at Unknown time  . OXcarbazepine (TRILEPTAL) 150 MG tablet Take 1 tablet (150 mg total) by mouth 2 (two) times daily. 60 tablet 0 02/29/2016 at Unknown time  . ziprasidone (GEODON) 20 MG capsule Take 1 capsule (20 mg total) by mouth 2 (two) times daily as needed (SEVERE ANXIETY/AGITATION). 30 capsule 0 unknown    Patient Stressors: Financial difficulties Marital or family conflict Medication change or noncompliance  Patient Strengths: Ability for Warden/ranger for treatment/growth  Treatment Modalities: Medication Management, Group therapy, Case management,  1 to 1 session with clinician, Psychoeducation, Recreational therapy.   Physician Treatment Plan for Primary Diagnosis: Schizoaffective disorder, bipolar type (HCC) Long Term Goal(s): Improvement in symptoms so as ready for discharge Improvement in symptoms so as ready for discharge   Short Term Goals: Ability to identify changes in lifestyle to reduce recurrence of condition will improve Ability to demonstrate self-control will improve Ability to identify and develop effective coping behaviors will improve Compliance with prescribed medications will improve Ability to identify triggers associated with substance abuse/mental health issues will improve Ability to identify changes in lifestyle to reduce recurrence of condition will improve Ability to verbalize  feelings will improve Ability to demonstrate self-control will improve Compliance with prescribed medications will improve Ability to identify triggers associated with substance abuse/mental health issues will improve  Medication Management: Evaluate patient's response, side effects, and  tolerance of medication regimen.  Therapeutic Interventions: 1 to 1 sessions, Unit Group sessions and Medication administration.  Evaluation of Outcomes: Progressing  Physician Treatment Plan for Secondary Diagnosis: Principal Problem:   Schizoaffective disorder, bipolar type (HCC)  Long Term Goal(s): Improvement in symptoms so as ready for discharge Improvement in symptoms so as ready for discharge   Short Term Goals: Ability to identify changes in lifestyle to reduce recurrence of condition will improve Ability to demonstrate self-control will improve Ability to identify and develop effective coping behaviors will improve Compliance with prescribed medications will improve Ability to identify triggers associated with substance abuse/mental health issues will improve Ability to identify changes in lifestyle to reduce recurrence of condition will improve Ability to verbalize feelings will improve Ability to demonstrate self-control will improve Compliance with prescribed medications will improve Ability to identify triggers associated with substance abuse/mental health issues will improve     Medication Management: Evaluate patient's response, side effects, and tolerance of medication regimen.  Therapeutic Interventions: 1 to 1 sessions, Unit Group sessions and Medication administration.  Evaluation of Outcomes: Progressing   RN Treatment Plan for Primary Diagnosis: Schizoaffective disorder, bipolar type (HCC) Long Term Goal(s): Knowledge of disease  Short Term Goals: Ability to remain free from injury will improve, Ability to demonstrate self-control, Ability to disclose and discuss suicidal ideas and Compliance with prescribed medications will improve  Medication Management: RN will administer medications as ordered by provider, will assess and evaluate patient's response and provide education to patient for prescribed medication. RN will report any adverse and/or side effects to  prescribing provider.  Therapeutic Interventions: 1 on 1 counseling sessions, Psychoeducation, Medication administration, Evaluate responses to treatment, Monitor vital signs and CBGs as ordered, Perform/monitor CIWA, COWS, AIMS and Fall Risk screenings as ordered, Perform wound care treatments as ordered.  Evaluation of Outcomes: Progressing   LCSW Treatment Plan for Primary Diagnosis: Schizoaffective disorder, bipolar type (HCC) Long Term Goal(s): Safe transition to appropriate next level of care at discharge, Engage patient in therapeutic group addressing interpersonal concerns.  Short Term Goals: Engage patient in aftercare planning with referrals and resources, Increase social support, Facilitate acceptance of mental health diagnosis and concerns and Increase skills for wellness and recovery  Therapeutic Interventions: Assess for all discharge needs, 1 to 1 time with Social worker, Explore available resources and support systems, Assess for adequacy in community support network, Educate family and significant other(s) on suicide prevention, Complete Psychosocial Assessment, Interpersonal group therapy.  Evaluation of Outcomes: Progressing   Progress in Treatment: Attending groups: No. Participating in groups: No. Taking medication as prescribed: Yes. Toleration medication: Yes. Family/Significant other contact made: No, will contact:  CSW assessing proper contacts. Patient understands diagnosis: Yes. Discussing patient identified problems/goals with staff: Yes. Medical problems stabilized or resolved: Yes. Denies suicidal/homicidal ideation: Yes. Issues/concerns per patient self-inventory: No.  New problem(s) identified: No, Describe:  None identified.   Reason for Continuation of Hospitalization: Delusions  Hallucinations  Estimated Length of Stay: 7 days  Attendees: Patient: Jessica [name] 03/14/2016 10:26 AM  Physician: Dr. Kristine Linea, MD  03/14/2016 10:26 AM   Nursing: Leonia Reader, RN 03/14/2016 10:26 AM  RN Care Manager: 03/14/2016 10:26 AM  Social Worker: Hampton Abbot, MSW, LCSW-A 03/14/2016 10:26 AM  Recreational Therapist: Hershal Coria, LRT, CTRS  03/14/2016 10:26 AM   Scribe for Treatment Team: Lynden OxfordKadijah R Kaylla Cobos, LCSWA 03/14/2016 10:26 AM

## 2016-03-14 NOTE — Plan of Care (Signed)
Problem: Nutritional: Goal: Ability to achieve adequate nutritional intake will improve Outcome: Not Progressing Refused nutrition supplement (ensure). Not motivated for self care

## 2016-03-14 NOTE — Plan of Care (Signed)
Problem: Coping: Goal: Ability to verbalize feelings will improve Outcome: Not Progressing Remains guarded and and isolative in room tonight. Avoiding to talk about her condition.

## 2016-03-14 NOTE — Plan of Care (Signed)
Problem: Self-Care: Goal: Ability to participate in self-care as condition permits will improve Outcome: Not Progressing Patient stays in bed, refusing to participate in unit activities. Guarded and avoiding conversations with staff and peers

## 2016-03-14 NOTE — Progress Notes (Addendum)
20:00: Patient currently in bed resting. Alert and oriented but guarded and spontaneous,  and avoiding conversations. Refused her ensure, saying that she does not need it tonight. Said she may take it in AM. Denies SI/HI. Sad and depressed at this moment. Was encouraged to get out of the room and join others in the dayroom. Safety precautions maintained, call light in reach.  22:00: Patient currently in bed sleeping, eyes closed. Respirations within normal limits. No sign of discomfort noted. Will continue to monitor.  0600: Patient remained asleep. Currently awake and visible in the hallway. Guarded and suspicious. Pacing and avoiding to talk to staff. Has not expressed any concern. Safety precautions maintained.

## 2016-03-14 NOTE — [facility] Group Notes (Signed)
[facility] Group Notes:  (Nursing/MHT/Case Management/Adjunct)  Date:  03/14/2016  Time:  5:15 PM  Type of Therapy:  Psychoeducational Skills  Participation Level:  Did Not Attend   Twanna Hymanda C Suly Vukelich 03/14/2016, 5:15 PM

## 2016-03-14 NOTE — Progress Notes (Signed)
Denies SI/HI/AVH.  Stays to self. Minimal communication with staff.  No engagement with peers noted.  Avoids eye contact.  Keeps head lowered.  Support and encouragement offered. Safety maintained.

## 2016-03-14 NOTE — Progress Notes (Signed)
[facility] MD Progress Note  03/14/2016 6:12 PM JARIYAH HACKLEY  MRN:  [id number] Subjective:  Patient is a 41 year old Caucasian female with history of schizoaffective disorder bipolar type. She was brought in by police to [facility] emergency department on January 3rd. Per the emergency departments note patient's mother called EMS after the patient grabbed her arm and wouldn't let go. Upon EMS arrival the patient was standing aggressively in the doorway and saying "[place], [place]. There is too much evil in [place]". She was uncooperative with evaluation yelling occasionally. Nurses in the ER described her as agitated and refusing to dressed out in his scrubs tangential.  It has been reported patient was noncompliant with her medications.  Per chart review she was hospitalized twice last year in behavioral health [place]. Her last admission was in June 2017. She was discharged with a diagnosis of schizoaffective disorder bipolar type. During her stay in the hospital she was placed on known emergency forced medications.  1/5 patient refused all medications yesterday. She had a Manual hold on 1/4 after she refused to leave the office where she was being interviewed. Today she has been started unknown emergency forced medications. She refuses oral olanzapine earlier this morning but took it later on. Patient has been refusing to eat or drink anything. She has been in bed all day. She refused to speak with me during assessment today her only answer was "I have no comment". She refused to open her mouth to evaluate for dehydration.  1/8 patient was more cooperative and engaging assessment today. She tells me she's been diagnosed with bipolar disorder and was prescribed with Trileptal and Abilify. She wonders if she can take these medications again as they help her. Patient tells me she has slept well over the last 2 nights. She is not having homicidal or suicidal ideation. She has been having visual  hallucinations which she sees as periods she says she does not feel threatened or scared when she sees the spirit. As far as side effects that she felt little lightheaded this morning were no other side effects. Last week she was refusing to eat now she is saying she is feels her appetite is very low but she tried to eat some last night. She tells me she's been drinking ensures and Gatorades.   She has been compliant with most medications but has refused Depakote. This morning she only took 2.5 mg of olanzapine not the whole dose order which was 10 mg.    1/9 No much improvement. She refused to speak with me today she asked me to step out of the room. Did not answer any of my questions.  Oral intake is is still poor looks like she is having only one meal per day. She has been drinking ensures and gatorades. Vital signs are within the normal limits. She complied with medications yesterday after antipsychotic was changed from olanzapine to Abilify. Per nursing notes she slept 7 hours  1/10 patient has been compliant with Abilify and Trileptal. Yesterday she took 2 doses of Ativan 2 mg which appeared effective. Patient was seen in the evening in the dayroom having dinner. He was documented the patient ate 100% of her dinner. She also cooperated with labs yesterday. Unfortunately this morning she already refused Ativan. Per nursing staff she has been is sleeping well. She continues to have poor oral intake. Usually only eating one meal per day however she has been drinking appropriately. We completed a basic metabolic panel which was normal.  Her vital signs have been normal.   patient has been uncooperative for the last 2 days. She is always lying in bed covered with blankets. She does not talk to me or open her eyes. She told me yesterday to step out of her room. Today she told me she did not wanted to talk and wanted to end the conversation. Per nursing staff she is eating 1 meal a day, about 50% of that  meeting is what she actually eats. She has been drinking fluids. She is compliant with medications. She is allowing vital signs and they have been a stable. I will order labs today.  1/11 patient refused Ativan this morning. She only took 20 mg of Abilify. She is actually prescribed with 30 mg the patient says that it was too much. She appeared a little bit improved today. She actually sat on the bed and talk to me however she is very paranoid. She refuses to answer certain questions. Eventually when encouraged to take medications she told me that I needed to go with God. Patient tells me she was planning on stopping her fasting today.  1/12 no much improvement. Patient has to be reminded about the known emergency forced medication orders for her to comply. Yesterday she had 2 meals, she ate 30% of her lunch and 90% of her dinner. Her vital signs have been within the normal limits. Patient says that she is not longer having auditory hallucinations. She tells me that her mood has improved and is now "neutral". She complains of feeling dizzy. Denies any other issues or concerns. She was only partially cooperative during assessment. She was lying in bed covered with blankets and talk to me with her eyes closed.  Per nursing: Patient took medications this morning as they were prescribed. She however complaining about any had to be reminded that if she didn't comply with oral medication she had orders for injectable medications. Staff reported that oral intake appears to be improving. Patient continues to be paranoid, bizarre and hyperreligious  Patient seen today. She was in bed and would not respond to me. I shook her quite a bit on the shoulder and it was clear to me that she was intentionally ignoring me. Eventually she did respond and said that she wanted to just sleep some more. She is staying isolated and does not engage in groups. Minimal cooperation with treatment..  Follow-up for the 14th. Patient  was still in bed but she was more easily arousable and she sat up and spoke to me. Seems a little bit disheveled and flat in her affect. Vague in her speech and not able to give a very clear indication of why she is in the hospital. Denies feeling suicidal denies having any hallucinations. Nursing notes that she appears to be oversedated from some of her medication.  03/12/2016. Ms. Milholland is in bed after lunch. She does not open her eyes for me. She feels "so, so" and "does not know yet" what is wrong. She accepts medications. No group participation. No somatic complaints.   03/13/2016. There is absolutely no change since yesterday. Continue current treatment.  03/14/2016. Ms. Tax remains psychotic, disorganized, hyperreligous, isolative. She now accepts medications. She comes to medication room for meds and to dining area for food only. Otherwise, she is in bed with her eyes closed. Poor hygiene. No somatic complaitns. Complains of poor sleep last night. Mo group participation.    Per nursing: D: Pt denies SI/HI/AVH. Pt is pleasant and cooperative.  Pt has isolated to the unit this evening.   A: Pt was offered support and encouragement.  Pt was encourage to attend groups. Q 15 minute checks were done for safety.   R: safety maintained on unit.  Principal Problem: Schizoaffective disorder, bipolar type (HCC) Diagnosis:   Patient Active Problem List   Diagnosis Date Noted  . Schizoaffective disorder, bipolar type (HCC) [F25.0] 07/25/2015   Total Time spent with patient: 20 minutes  Past Psychiatric History: Patient has been hospitalized several times at behavioral health in [place]. She was just discharged in July 2017.  I don't have any information regarding prior suicidal attempts or self-injurious behaviors   Past Medical History:  Past Medical History:  Diagnosis Date  . Anxiety   . Bipolar 1 disorder (HCC)   . Depression   . Schizophrenia (HCC)    History reviewed. No  pertinent surgical history.  Family History:  Family History  Problem Relation Age of Onset  . Hypertension Mother   . Mental illness Father    Family Psychiatric  History: Unknown at this point as patient is uncooperative   Social History: Not much information is available at this time other than she lives with her mother in [place]. 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 Marital status: Single    Spouse name: N/A  . Number of children: N/A  . Years of education: N/A   Social History Main Topics  . Smoking status: Never Smoker  . Smokeless tobacco: Never Used  . Alcohol use No  . Drug use: No  . Sexual activity: No   Other Topics Concern  . None   Social History Narrative  . None   Additional Social History:    Pain Medications: none Prescriptions: none Over the Counter: none History of alcohol / drug use?: No history of alcohol / drug abuse Negative Consequences of Use: Personal relationships, Financial, Work / School Withdrawal Symptoms: Other (Comment)      Current Medications: Current Facility-Administered Medications  Medication Dose Route Frequency Provider Last Rate Last Dose  . acetaminophen (TYLENOL) tablet 650 mg  650 mg Oral Q6H PRN Jimmy Footman, MD      . alum & mag hydroxide-simeth (MAALOX/MYLANTA) 200-200-20 MG/5ML suspension 30 mL  30 mL Oral Q4H PRN Jimmy Footman, MD      . ARIPiprazole (ABILIFY) tablet 30 mg  30 mg Oral Daily Jimmy Footman, MD   30 mg at 03/14/16 0807  . OLANZapine (ZYPREXA) injection 10 mg  10 mg Intramuscular Daily Jimmy Footman, MD       Or  . ARIPiprazole (ABILIFY) tablet 30 mg  30 mg Oral Daily Jimmy Footman, MD   30 mg at 03/14/16 0802  . feeding supplement (ENSURE ENLIVE) (ENSURE ENLIVE) liquid 237 mL  237 mL Oral TID BM Jimmy Footman, MD   237 mL at 03/14/16 1000  . haloperidol (HALDOL) tablet 5 mg  5 mg Oral BID Suha Schoenbeck B Harvest Deist, MD      . magnesium hydroxide (MILK OF MAGNESIA) suspension 30 mL  30 mL Oral Daily PRN Jimmy Footman, MD      . multivitamin with minerals tablet 1 tablet  1 tablet Oral Daily Jimmy Footman, MD   1 tablet at 03/14/16 0801  . Oxcarbazepine (TRILEPTAL) tablet 300 mg  300 mg Oral  BID Jimmy FootmanAndrea Hernandez-Gonzalez, MD   300 mg at 03/14/16 1643    Lab Results:  No results found for this or any previous visit (from the past 48 hour(s)).  Blood Alcohol level:  Lab Results  Component Value Date   ETH <5 02/29/2016   ETH <5 08/03/2015    Metabolic Disorder Labs: Lab Results  Component Value Date   HGBA1C 5.4 03/07/2016   MPG 108 03/07/2016   MPG 117 07/27/2015   Lab Results  Component Value Date   PROLACTIN 20.7 07/27/2015   Lab Results  Component Value Date   CHOL 165 03/07/2016   TRIG 67 03/07/2016   HDL 48 03/07/2016   CHOLHDL 3.4 03/07/2016   VLDL 13 03/07/2016   LDLCALC 104 (H) 03/07/2016   LDLCALC 95 07/27/2015    Physical Findings: AIMS:  , ,  ,  ,    CIWA:    COWS:  COWS Total Score: 1  Musculoskeletal: Strength & Muscle Tone: within normal limits Gait & Station: normal Patient leans: N/A  Psychiatric Specialty Exam: Physical Exam  Nursing note and vitals reviewed. Constitutional: She is oriented to person, place, and time. She appears well-developed and well-nourished.  HENT:  Head: Normocephalic and atraumatic.  Eyes: Conjunctivae and EOM are normal.  Neck: Normal range of motion.  Respiratory: Effort normal.  Musculoskeletal: Normal range of motion.  Neurological: She is alert and oriented to person, place, and time.    Review of Systems  Unable to perform ROS: Acuity of condition  Psychiatric/Behavioral: Positive for hallucinations.  All other systems reviewed and are negative.   Blood pressure 114/73, pulse  90, temperature 98.4 F (36.9 C), temperature source Oral, resp. rate 18, height 5\' 4"  (1.626 m), weight 90.7 kg (200 lb), last menstrual period 02/27/2016, SpO2 99 %.Body mass index is 34.33 kg/m.  General Appearance: Disheveled  Eye Contact:  Minimal  Speech:  Normal Rate  Volume:  Normal  Mood:  Irritable  Affect:  Constricted  Thought Process:  Disorganized and Descriptions of Associations: Loose  Orientation:  Full (Time, Place, and Person)  Thought Content:  Delusions  Suicidal Thoughts:  No  Homicidal Thoughts:  No  Memory:  Immediate;   Poor Recent;   Poor Remote;   Poor  Judgement:  Impaired  Insight:  Lacking  Psychomotor Activity:  Decreased  Concentration:  Concentration: Poor and Attention Span: Poor  Recall:  Poor  Fund of Knowledge:  Poor  Language:  Fair  Akathisia:  No  Handed:    AIMS (if indicated):     Assets:  Communication Skills Leisure Time  ADL's:  Intact  Cognition:  Impaired,  Mild  Sleep:  Number of Hours: 8     Treatment Plan Summary:  No improvement since admission. Basically nonverbal. Withdrawn to her room minimal oral intake. Only eating one meal a day and Ativan in she is only eating about 50%. She has been drinking ensures per nursing staff.  Patient is a 41 year old Caucasian female with history of schizoaffective bipolar type. He presented to the emergency department due to aggression, agitation, and psychosis. Patient has not been compliant with medications.  For schizoaffective disorder: Patient has been lying in bed all day. She is not attending any programming. She is refusing to speak to me. She refused to speak with me and the Child psychotherapistsocial worker. She has been invited to participate in treatment team twice but she has declined.  Patient has been is started on on Abilify and Trileptal 300  mg twice a day.  Plan to titrate up Abilify and offer Abilify injectable. Abilify was chosen as patient requested to take it and the patient's mother  reported patient had had a good response to Abilify in the past. The patient has been taking Abilify since Jan 5th. Not much improvement and has yet been sneaking when my need to consider the possibility of switching to a different antipsychotic. Patient initially received olanzapine in these medications seemed to help her. The other possibility would be to consider Hinda Glatter (which has a long acting injectable) as the patient has history of being noncompliant.   Continue abilify to 30 mg. She refuses to take full dose of Abilify on 1/11. She was only willing to take 20 mg.Orders for Non emergency forced medications again were placed again. If patient refuses full ose of Abilify and she will receive olanzapine IM.  For possible catatonia patient started on Ativan 2 mg tid.----Appears to come out of her room more often when after she takes Ativan.  For agitation: continue Ativan 2 mg every 4 hours as needed  Patient require Manual Hold on 1/4 as she refused to leave the office where I was interviewing her on 1/4.   Ordered multivitamins  Diet regular  Vital signs daily  Continue to push fluids. Ordered ensure tid  Precautions every 15 minute checks  Hospitalization status continue involuntary commitment------ [facility] has been [facility] to [facility].  We will provide the mother with a letter recommending her to become legal guardian. Letter recommending guardian she was mailed to the patient's mother on 1/11.   Labs: TSH, hemoglobin A1c and lipid panel, b12, BMP--has refused all labs---finally cooperative with labs on 1/10.  All wnl  Collateral information has been  obtained from the patient's mother. Patient has declined for [place] to have any contact with the mother however at this point in time she does not have capacity to make that decision. She is very psychotic. The mother will be contacted again today. We plan to discuss with her the need of guardianship in this case. This  patient has refused to apply for Medicaid or disability. She isn't paranoid and unable to work. Mother feels very unsafe with her around the house as patient is noncompliant with medication. Patient displayed aggression towards the family prior to admission. They do not plan for her would be to have potentially group home placement, and act team and a guardian and I will agree for a long acting injectable.   Spoke with the patient's mother again on 1/11. We we will write a letter for deal for [place] court supporting the need for the patient to have a guardian. The letter will be given mail to his mother. She plans to go to the court house next week and apply for guardianship.   Mother will call SW for updates on Wednesday 1/17 at 2 pm.  Mother plans to take letter to court house in [place] on Tuesday of next week.  Continue psychiatric medicine for bipolar disorder. Supportive review of plan with patient even though she was not really participating with it. No other change to medicine today.  Patient is blunted. Doesn't involve herself much on the unit. Minimal insight. After discussion with nursing I did cut down her Ativan standing doses by quite a bit. No other change to medication. Encourage patient to get out of her room and participate.  03/12/2016 No medication changes  03/13/2016 No medication changes.  03/14/2016 I will add Haldol  5 mg bid for psychosis.   Kristine Linea, MD 03/14/2016, 6:12 PM

## 2016-03-14 NOTE — Progress Notes (Signed)
Recreation Therapy Notes  Date: 01.17.18 Time: 9:30 am Location: Craft Room  Group Topic: Self-esteem  Goal Area(s) Addresses:  Patient will write at least one positive trait about self. Patient will verbalize benefit of having a healthy self-esteem.  Behavioral Response: Did not attend  Intervention: I Am  Activity: Patients were given a worksheet with the letter I on it and were instructed to write as many positive traits about themselves inside the letter.  Education: LRT educated patients on ways they can increase their self-esteem.  Education Outcome: Patient did not attend group.   Clinical Observations/Feedback: Patient did not attend group.  Gracious Renken M, LRT/CTRS 03/14/2016 10:24 AM 

## 2016-03-15 NOTE — Progress Notes (Signed)
2330: Patient woke up and starting pacing. Guarded, suspicious and anxious. Medications offered but patient refused. "I am just stretching, I don't need no medication, maybe in the morning...". Safety precautions reinforced.

## 2016-03-15 NOTE — [facility] Group Notes (Signed)
[facility] Group Notes:  (Nursing/MHT/Case Management/Adjunct)  Date:  03/15/2016  Time:  6:23 PM  Type of Therapy:  Psychoeducational Skills  Participation Level:  Did Not Attend   Jessica [name] 03/15/2016, 6:23 PM

## 2016-03-15 NOTE — Progress Notes (Signed)
0030: Patient back to bed, currently awake. Remains guarded and suspicious. Therapeutic milieu promoted. Safety and security maintained.

## 2016-03-15 NOTE — Plan of Care (Signed)
Problem: Education: Goal: Knowledge of the prescribed therapeutic regimen will improve Outcome: Not Progressing Pt has been taking her PO medications as ordered, but does require much explanation, support and encouragement.

## 2016-03-15 NOTE — Progress Notes (Signed)
Pt seems to have digressed since this nurse's last encounter with caring for pt. Pt very demanding this morning during medication administration. Approached this nurse demanding she is only taking Abilify 20mg , a multivitamin, and Trileptal 300mg . This nurse updated her on the medications ordered for her to take at this time, and after much explanation, pt did comply with taking the medications as ordered, see MAR. Pt ritualistic in the way she takes her medications. Will not drink water until after she swallows the pills with saliva. During medication administration, pt tells this nurse "the Shaune PollackLord does not like your ways." Pt uses hand gestures and turns her eyes down to anyone who approaches or passes by her. Pt irritable, psychotic, disorganized. Appears disheveled with poor hygiene. Reports "I didn't sleep as much as I want to." Safety maintained with every 15 minute checks. Will continue to monitor.

## 2016-03-15 NOTE — [facility] Group Notes (Signed)
[facility] LCSW Group Therapy Note  Date/Time: 03/15/16 1300  Type of Therapy/Topic:  Group Therapy:  Balance in Life  Participation Level:  Pt was invited but did not attend group.  Description of Group:    This group will address the concept of balance and how it feels and looks when one is unbalanced. Patients will be encouraged to process areas in their lives that are out of balance, and identify reasons for remaining unbalanced. Facilitators will guide patients utilizing problem- solving interventions to address and correct the stressor making their life unbalanced. Understanding and applying boundaries will be explored and addressed for obtaining  and maintaining a balanced life. Patients will be encouraged to explore ways to assertively make their unbalanced needs known to significant others in their lives, using other group members and facilitator for support and feedback.  Therapeutic Goals: 1. Patient will identify two or more emotions or situations they have that consume much of in their lives. 2. Patient will identify signs/triggers that life has become out of balance:  3. Patient will identify two ways to set boundaries in order to achieve balance in their lives:  4. Patient will demonstrate ability to communicate their needs through discussion and/or role plays  Summary of Patient Progress:     Therapeutic Modalities:   Cognitive Behavioral Therapy Solution-Focused Therapy Assertiveness Training  Greg Mara Favero, LCSW 

## 2016-03-15 NOTE — Progress Notes (Signed)
[facility]  03/15/2016 9:56 AM Jessica [name]  MRN:  [id number] Subjective:  Patient is a 41 year old Caucasian female with history of schizoaffective disorder bipolar type. She was brought in by police to [facility] emergency department on January 3rd. Per the emergency departments note patient's mother called EMS after the patient grabbed her arm and wouldn't let go. Upon EMS arrival the patient was standing aggressively in the doorway and saying "[place]. There is too much [place] She was uncooperative with evaluation yelling occasionally. Nurses in the ER described her as agitated and refusing to dressed out in his scrubs tangential.  It has been reported patient was noncompliant with her medications.  Per chart review she was hospitalized twice last year in behavioral health [place]. Her last admission was in June 2017. She was discharged with a diagnosis of schizoaffective disorder bipolar type. During her stay in the hospital she was placed on known emergency forced medications.  1/5 patient refused all medications yesterday. She had a Manual hold on 1/4 after she refused to leave the office where she was being interviewed. Today she has been started unknown emergency forced medications. She refuses oral olanzapine earlier this morning but took it later on. Patient has been refusing to eat or drink anything. She has been in bed all day. She refused to speak with me during assessment today her only answer was "I have no comment". She refused to open her mouth to evaluate for dehydration.  1/8 patient was more cooperative and engaging assessment today. She tells me she's been diagnosed with bipolar disorder and was prescribed with Trileptal and Abilify. She wonders if she can take these medications again as they help her. Patient tells me she has slept well over the last 2 nights. She is not having homicidal or suicidal ideation. She has been having visual  hallucinations which she sees as periods she says she does not feel threatened or scared when she sees the spirit. As far as side effects that she felt little lightheaded this morning were no other side effects. Last week she was refusing to eat now she is saying she is feels her appetite is very low but she tried to eat some last night. She tells me she's been drinking ensures and Gatorades.   She has been compliant with most medications but has refused Depakote. This morning she only took 2.5 mg of olanzapine not the whole dose order which was 10 mg.    1/9 No much improvement. She refused to speak with me today she asked me to step out of the room. Did not answer any of my questions.  Oral intake is is still poor looks like she is having only one meal per day. She has been drinking ensures and gatorades. Vital signs are within the normal limits. She complied with medications yesterday after antipsychotic was changed from olanzapine to Abilify. Per nursing notes she slept 7 hours  1/10 patient has been compliant with Abilify and Trileptal. Yesterday she took 2 doses of Ativan 2 mg which appeared effective. Patient was seen in the evening in the dayroom having dinner. He was documented the patient ate 100% of her dinner. She also cooperated with labs yesterday. Unfortunately this morning she already refused Ativan. Per nursing staff she has been is sleeping well. She continues to have poor oral intake. Usually only eating one meal per day however she has been drinking appropriately. We completed a basic metabolic panel which was normal.  Her vital signs have been normal.   patient has been uncooperative for the last 2 days. She is always lying in bed covered with blankets. She does not talk to me or open her eyes. She told me yesterday to step out of her room. Today she told me she did not wanted to talk and wanted to end the conversation. Per nursing staff she is eating 1 meal a day, about 50% of that  meeting is what she actually eats. She has been drinking fluids. She is compliant with medications. She is allowing vital signs and they have been a stable. I will order labs today.  1/11 patient refused Ativan this morning. She only took 20 mg of Abilify. She is actually prescribed with 30 mg the patient says that it was too much. She appeared a little bit improved today. She actually sat on the bed and talk to me however she is very paranoid. She refuses to answer certain questions. Eventually when encouraged to take medications she told me that I needed to go with God. Patient tells me she was planning on stopping her fasting today.  1/12 no much improvement. Patient has to be reminded about the known emergency forced medication orders for her to comply. Yesterday she had 2 meals, she ate 30% of her lunch and 90% of her dinner. Her vital signs have been within the normal limits. Patient says that she is not longer having auditory hallucinations. She tells me that her mood has improved and is now "neutral". She complains of feeling dizzy. Denies any other issues or concerns. She was only partially cooperative during assessment. She was lying in bed covered with blankets and talk to me with her eyes closed.  Per nursing: Patient took medications this morning as they were prescribed. She however complaining about any had to be reminded that if she didn't comply with oral medication she had orders for injectable medications. Staff reported that oral intake appears to be improving. Patient continues to be paranoid, bizarre and hyperreligious  Patient seen today. She was in bed and would not respond to me. I shook her quite a bit on the shoulder and it was clear to me that she was intentionally ignoring me. Eventually she did respond and said that she wanted to just sleep some more. She is staying isolated and does not engage in groups. Minimal cooperation with treatment..  Follow-up for the 14th. Patient  was still in bed but she was more easily arousable and she sat up and spoke to me. Seems a little bit disheveled and flat in her affect. Vague in her speech and not able to give a very clear indication of why she is in the hospital. Denies feeling suicidal denies having any hallucinations. Nursing notes that she appears to be oversedated from some of her medication.  03/12/2016. Jessica [name] is in bed after lunch. She does not open her eyes for me. She feels "so, so" and "does not know yet" what is wrong. She accepts medications. No group participation. No somatic complaints.   03/13/2016. There is absolutely no change since yesterday. Continue current treatment.  03/14/2016. Jessica [name] remains psychotic, disorganized, hyperreligous, isolative. She now accepts medications. She comes to medication room for meds and to dining area for food only. Otherwise, she is in bed with her eyes closed. Poor hygiene. No somatic complaitns. Complains of poor sleep last night. No group participation.   03/15/2016. Jessica [name] recently responded to my questions. She said that she  was doing better. But then closed her eyes and did not answer any further questions. She is aware of her surroundings. The first time I went to her room this morning she was in the bathroom and was able to talk to me through the curtain. She did not voice any somatic complaints. There were no complaints of side effects. I started Haldol last night to address psychotic symptoms. She stays secluded to her room and to her bed with her eyes closed. She does not interact with peers or staff but comes out for medications and meals.   Per nursing: 20:00: Patient currently in bed resting. Alert and oriented but guarded and spontaneous,  and avoiding conversations. Refused her ensure, saying that she does not need it tonight. Said she may take it in AM. Denies SI/HI. Sad and depressed at this moment. Was encouraged to get out of the room and join others in the  dayroom. Safety precautions maintained, call light in reach.  22:00: Patient currently in bed sleeping, eyes closed. Respirations within normal limits. No sign of discomfort noted. Will continue to monitor  Principal Problem: Schizoaffective disorder, bipolar type [facility]) Diagnosis:   Patient Active Problem List   Diagnosis Date Noted  . Schizoaffective disorder, bipolar type (HCC) [F25.0] 07/25/2015   Total Time spent with patient: 20 minutes  Past Psychiatric History: Patient has been hospitalized several times at behavioral health in [place]. She was just discharged in July 2017.  I don't have any information regarding prior suicidal attempts or self-injurious behaviors   Past Medical History:  Past Medical History:  Diagnosis Date  . Anxiety   . Bipolar 1 disorder (HCC)   . Depression   . Schizophrenia (HCC)    History reviewed. No pertinent surgical history.  Family History:  Family History  Problem Relation Age of Onset  . Hypertension Mother   . Mental illness Father    Family Psychiatric  History: Unknown at this point as patient is uncooperative   Social History: Not much information is available at this time other than she lives with her mother in [place]. 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 Marital status: Single    Spouse name: N/A  . Number of children: N/A  . Years of education: N/A   Social History Main Topics  . Smoking status: Never Smoker  . Smokeless tobacco: Never Used  . Alcohol use No  . Drug use: No  . Sexual activity: No   Other Topics Concern  . None   Social History Narrative  . None   Additional Social History:    Pain Medications: none Prescriptions: none Over the Counter: none History of alcohol / drug use?: No history of alcohol / drug abuse Negative Consequences of Use: Personal  relationships, Financial, Work / School Withdrawal Symptoms: Other (Comment)      Current Medications: Current Facility-Administered Medications  Medication Dose Route Frequency Provider Last Rate Last Dose  . acetaminophen (TYLENOL) tablet 650 mg  650 mg Oral Q6H PRN Jimmy Footman, MD      . alum & mag hydroxide-simeth (MAALOX/MYLANTA) 200-200-20 MG/5ML suspension 30 mL  30 mL Oral Q4H PRN Jimmy Footman, MD      . ARIPiprazole (ABILIFY) tablet 30 mg  30 mg Oral Daily Jimmy Footman, MD   30 mg at 03/15/16  0800  . OLANZapine (ZYPREXA) injection 10 mg  10 mg Intramuscular Daily Jimmy Footman, MD       Or  . ARIPiprazole (ABILIFY) tablet 30 mg  30 mg Oral Daily Jimmy Footman, MD   30 mg at 03/14/16 0802  . feeding supplement (ENSURE ENLIVE) (ENSURE ENLIVE) liquid 237 mL  237 mL Oral TID BM Jimmy Footman, MD   237 mL at 03/14/16 1400  . haloperidol (HALDOL) tablet 5 mg  5 mg Oral BID Shari Prows, MD   5 mg at 03/15/16 0759  . magnesium hydroxide (MILK OF MAGNESIA) suspension 30 mL  30 mL Oral Daily PRN Jimmy Footman, MD      . multivitamin with minerals tablet 1 tablet  1 tablet Oral Daily Jimmy Footman, MD   1 tablet at 03/15/16 0759  . Oxcarbazepine (TRILEPTAL) tablet 300 mg  300 mg Oral BID Jimmy Footman, MD   300 mg at 03/15/16 1610    Lab Results:  No results found for this or any previous visit (from the past 48 hour(s)).  Blood Alcohol level:  Lab Results  Component Value Date   ETH <5 02/29/2016   ETH <5 08/03/2015    Metabolic Disorder Labs: Lab Results  Component Value Date   HGBA1C 5.4 03/07/2016   MPG 108 03/07/2016   MPG 117 07/27/2015   Lab Results  Component Value Date   PROLACTIN 20.7 07/27/2015   Lab Results  Component Value Date   CHOL 165 03/07/2016   TRIG 67 03/07/2016   HDL 48 03/07/2016   CHOLHDL 3.4 03/07/2016   VLDL 13 03/07/2016    LDLCALC 104 (H) 03/07/2016   LDLCALC 95 07/27/2015    Physical Findings: AIMS:  , ,  ,  ,    CIWA:    COWS:  COWS Total Score: 1  Musculoskeletal: Strength & Muscle Tone: within normal limits Gait & Station: normal Patient leans: N/A  Psychiatric Specialty Exam: Physical Exam  Nursing note and vitals reviewed. Constitutional: She is oriented to person, place, and time. She appears well-developed and well-nourished.  HENT:  Head: Normocephalic and atraumatic.  Eyes: Conjunctivae and EOM are normal.  Neck: Normal range of motion.  Respiratory: Effort normal.  Musculoskeletal: Normal range of motion.  Neurological: She is alert and oriented to person, place, and time.    Review of Systems  Unable to perform ROS: Acuity of condition  Psychiatric/Behavioral: Positive for hallucinations.  All other systems reviewed and are negative.   Blood pressure 114/72, pulse 78, temperature 98.2 F (36.8 C), resp. rate 18, height 5\' 4"  (1.626 m), weight 90.7 kg (200 lb), last menstrual period 02/27/2016, SpO2 99 %.Body mass index is 34.33 kg/m.  General Appearance: Disheveled  Eye Contact:  Minimal  Speech:  Normal Rate  Volume:  Normal  Mood:  Irritable  Affect:  Constricted  Thought Process:  Disorganized and Descriptions of Associations: Loose  Orientation:  Full (Time, Place, and Person)  Thought Content:  Delusions  Suicidal Thoughts:  No  Homicidal Thoughts:  No  Memory:  Immediate;   Poor Recent;   Poor Remote;   Poor  Judgement:  Impaired  Insight:  Lacking  Psychomotor Activity:  Decreased  Concentration:  Concentration: Poor and Attention Span: Poor  Recall:  Poor  Fund of Knowledge:  Poor  Language:  Fair  Akathisia:  No  Handed:    AIMS (if indicated):     Assets:  Communication Skills Leisure Time  ADL's:  Intact  Cognition:  Impaired,  Mild  Sleep:  Number of Hours: 6     Treatment Plan Summary:  No improvement since admission. Basically nonverbal.  Withdrawn to her room minimal oral intake. Only eating one meal a day and Ativan in she is only eating about 50%. She has been drinking ensures per nursing staff.  Patient is a 41 year old Caucasian female with history of schizoaffective bipolar type. He presented to the emergency department due to aggression, agitation, and psychosis. Patient has not been compliant with medications.  For schizoaffective disorder: Patient has been lying in bed all day. She is not attending any programming. She is refusing to speak to me. She refused to speak with me and the Child psychotherapist. She has been invited to participate in treatment team twice but she has declined.  Patient has been is started on on Abilify and Trileptal 300 mg twice a day.  Plan to titrate up Abilify and offer Abilify injectable. Abilify was chosen as patient requested to take it and the patient's mother reported patient had had a good response to Abilify in the past. The patient has been taking Abilify since Jan 5th. Not much improvement and has yet been sneaking when my need to consider the possibility of switching to a different antipsychotic. Patient initially received olanzapine in these medications seemed to help her. The other possibility would be to consider Hinda Glatter (which has a long acting injectable) as the patient has history of being noncompliant.   Continue abilify to 30 mg. She refuses to take full dose of Abilify on 1/11. She was only willing to take 20 mg.Orders for Non emergency forced medications again were placed again. If patient refuses full ose of Abilify and she will receive olanzapine IM.  For possible catatonia patient started on Ativan 2 mg tid.----Appears to come out of her room more often when after she takes Ativan.  For agitation: continue Ativan 2 mg every 4 hours as needed  Patient require Manual Hold on 1/4 as she refused to leave the office where I was interviewing her on 1/4.   Ordered multivitamins  Diet  regular  Vital signs daily  Continue to push fluids. Ordered ensure tid  Precautions every 15 minute checks  Hospitalization status continue involuntary commitment------ referral has been made to [facility].  We will provide the mother with a letter recommending her to become legal guardian. Letter recommending guardian she was mailed to the patient's mother on 1/11.   Labs: TSH, hemoglobin A1c and lipid panel, b12, BMP--has refused all labs---finally cooperative with labs on 1/10.  All wnl  Collateral information has been  obtained from the patient's mother. Patient has declined for [place] to have any contact with the mother however at this point in time she does not have capacity to make that decision. She is very psychotic. The mother will be contacted again today. We plan to discuss with her the need of guardianship in this case. This patient has refused to apply for Medicaid or disability. She isn't paranoid and unable to work. Mother feels very unsafe with her around the house as patient is noncompliant with medication. Patient displayed aggression towards the family prior to admission. They do not plan for her would be to have potentially group home placement, and act team and a guardian and I will agree for a long acting injectable.   Spoke with the patient's mother again on 1/11. We we will write a letter for deal for [place] court supporting the  need for the patient to have a guardian. The letter will be given mail to his mother. She plans to go to the court house next week and apply for guardianship.   Mother will call SW for updates on Wednesday 1/17 at 2 pm.  Mother plans to take letter to court house in [place] on Tuesday of next week.  Continue psychiatric medicine for bipolar disorder. Supportive review of plan with patient even though she was not really participating with it. No other change to medicine today.  Patient is blunted. Doesn't involve herself much on  the unit. Minimal insight. After discussion with nursing I did cut down her Ativan standing doses by quite a bit. No other change to medication. Encourage patient to get out of her room and participate.  03/12/2016 No medication changes  03/13/2016 No medication changes.  03/14/2016 I will add Haldol 5 mg bid for psychosis.  03/15/2016 No medication changes.   Kristine Linea, MD 03/15/2016, 9:56 AM

## 2016-03-15 NOTE — Progress Notes (Signed)
Recreation Therapy Notes  Date: 01.18.18 Time: 9:30 am Location: Craft Room  Group Topic: Leisure Education  Goal Area(s) Addresses:  Patient will identify things they are grateful for. Patient will identify how being grateful can influence decision making.  Behavioral Response: Did not attend  Intervention: Grateful Wheel  Activity: Patients were given an I Am Grateful For worksheet and were instructed to write things they are grateful for under each category.  Education: LRT educated patients on leisure and why it is important.  Education Outcome: Patient did not attend group.  Clinical Observations/Feedback: Patient did not attend group.  Jacquelynn CreeGreene,Kinzi Frediani M, LRT/CTRS 03/15/2016 10:15 AM

## 2016-03-16 NOTE — Tx Team (Signed)
Interdisciplinary Treatment and Diagnostic Plan Update  03/16/2016 Time of Session: 10:30am [name] MRN: [id number]  Principal Diagnosis: Schizoaffective disorder, bipolar type [facility])  Secondary Diagnoses: Principal Problem:   Schizoaffective disorder, bipolar type (HCC)   Current Medications:  Current Facility-Administered Medications  Medication Dose Route Frequency Provider Last Rate Last Dose  . acetaminophen (TYLENOL) tablet 650 mg  650 mg Oral Q6H PRN Jimmy Footman, MD      . alum & mag hydroxide-simeth (MAALOX/MYLANTA) 200-200-20 MG/5ML suspension 30 mL  30 mL Oral Q4H PRN Jimmy Footman, MD      . ARIPiprazole (ABILIFY) tablet 30 mg  30 mg Oral Daily Jimmy Footman, MD   30 mg at 03/16/16 0758  . OLANZapine (ZYPREXA) injection 10 mg  10 mg Intramuscular Daily Jimmy Footman, MD       Or  . ARIPiprazole (ABILIFY) tablet 30 mg  30 mg Oral Daily Jimmy Footman, MD   30 mg at 03/14/16 0802  . feeding supplement (ENSURE ENLIVE) (ENSURE ENLIVE) liquid 237 mL  237 mL Oral TID BM Jimmy Footman, MD   237 mL at 03/16/16 1000  . haloperidol (HALDOL) tablet 5 mg  5 mg Oral BID Shari Prows, MD   5 mg at 03/16/16 0759  . magnesium hydroxide (MILK OF MAGNESIA) suspension 30 mL  30 mL Oral Daily PRN Jimmy Footman, MD      . multivitamin with minerals tablet 1 tablet  1 tablet Oral Daily Jimmy Footman, MD   1 tablet at 03/16/16 0758  . Oxcarbazepine (TRILEPTAL) tablet 300 mg  300 mg Oral BID Jimmy Footman, MD   300 mg at 03/16/16 0759   PTA Medications: Prescriptions Prior to Admission  Medication Sig Dispense Refill Last Dose  . ARIPiprazole (ABILIFY) 5 MG tablet Take 5 mg by mouth daily.   02/29/2016 at Unknown time  . doxepin (SINEQUAN) 10 MG capsule Take 2 capsules (20 mg total) by mouth at bedtime as needed (sleep). 30 capsule 0 unknown  . hydrOXYzine (ATARAX/VISTARIL) 25  MG tablet Take 1 tablet (25 mg total) by mouth every 6 (six) hours as needed for anxiety. 30 tablet 0 unknown  . OLANZapine (ZYPREXA) 2.5 MG tablet Take 5 tablets (12.5 mg total) by mouth at bedtime. (Patient not taking: Reported on 02/29/2016) 30 tablet 0 Not Taking at Unknown time  . OXcarbazepine (TRILEPTAL) 150 MG tablet Take 1 tablet (150 mg total) by mouth 2 (two) times daily. 60 tablet 0 02/29/2016 at Unknown time  . ziprasidone (GEODON) 20 MG capsule Take 1 capsule (20 mg total) by mouth 2 (two) times daily as needed (SEVERE ANXIETY/AGITATION). 30 capsule 0 unknown    Patient Stressors: Financial difficulties Marital or family conflict Medication change or noncompliance  Patient Strengths: Ability for Warden/ranger for treatment/growth  Treatment Modalities: Medication Management, Group therapy, Case management,  1 to 1 session with clinician, Psychoeducation, Recreational therapy.   Physician Treatment Plan for Primary Diagnosis: Schizoaffective disorder, bipolar type (HCC) Long Term Goal(s): Improvement in symptoms so as ready for discharge Improvement in symptoms so as ready for discharge   Short Term Goals: Ability to identify changes in lifestyle to reduce recurrence of condition will improve Ability to demonstrate self-control will improve Ability to identify and develop effective coping behaviors will improve Compliance with prescribed medications will improve Ability to identify triggers associated with substance abuse/mental health issues will improve Ability to identify changes in lifestyle to reduce recurrence of condition will improve Ability to verbalize feelings  will improve Ability to demonstrate self-control will improve Compliance with prescribed medications will improve Ability to identify triggers associated with substance abuse/mental health issues will improve  Medication Management: Evaluate patient's response, side effects, and  tolerance of medication regimen.  Therapeutic Interventions: 1 to 1 sessions, Unit Group sessions and Medication administration.  Evaluation of Outcomes: Progressing  Physician Treatment Plan for Secondary Diagnosis: Principal Problem:   Schizoaffective disorder, bipolar type (HCC)  Long Term Goal(s): Improvement in symptoms so as ready for discharge Improvement in symptoms so as ready for discharge   Short Term Goals: Ability to identify changes in lifestyle to reduce recurrence of condition will improve Ability to demonstrate self-control will improve Ability to identify and develop effective coping behaviors will improve Compliance with prescribed medications will improve Ability to identify triggers associated with substance abuse/mental health issues will improve Ability to identify changes in lifestyle to reduce recurrence of condition will improve Ability to verbalize feelings will improve Ability to demonstrate self-control will improve Compliance with prescribed medications will improve Ability to identify triggers associated with substance abuse/mental health issues will improve     Medication Management: Evaluate patient's response, side effects, and tolerance of medication regimen.  Therapeutic Interventions: 1 to 1 sessions, Unit Group sessions and Medication administration.  Evaluation of Outcomes: Progressing   RN Treatment Plan for Primary Diagnosis: Schizoaffective disorder, bipolar type (HCC) Long Term Goal(s): Knowledge of disease and therapeutic regimen to maintain health will improve  Short Term Goals: Ability to remain free from injury will improve, Ability to demonstrate self-control, Ability to disclose and discuss suicidal ideas and Compliance with prescribed medications will improve  Medication Management: RN will administer medications as ordered by provider, will assess and evaluate patient's response and provide education to patient for prescribed  medication. RN will report any adverse and/or side effects to prescribing provider.  Therapeutic Interventions: 1 on 1 counseling sessions, Psychoeducation, Medication administration, Evaluate responses to treatment, Monitor vital signs and CBGs as ordered, Perform/monitor CIWA, COWS, AIMS and Fall Risk screenings as ordered, Perform wound care treatments as ordered.  Evaluation of Outcomes: Progressing   LCSW Treatment Plan for Primary Diagnosis: Schizoaffective disorder, bipolar type (HCC) Long Term Goal(s): Safe transition to appropriate next level of care at discharge, Engage patient in therapeutic group addressing interpersonal concerns.  Short Term Goals: Engage patient in aftercare planning with referrals and resources, Increase social support, Facilitate acceptance of mental health diagnosis and concerns and Increase skills for wellness and recovery  Therapeutic Interventions: Assess for all discharge needs, 1 to 1 time with Social worker, Explore available resources and support systems, Assess for adequacy in community support network, Educate family and significant other(s) on suicide prevention, Complete Psychosocial Assessment, Interpersonal group therapy.  Evaluation of Outcomes: Progressing   Progress in Treatment: Attending groups: No. Participating in groups: No. Taking medication as prescribed: Yes. Toleration medication: Yes. Family/Significant other contact made: No, will contact:  patient has not consented for family contact Patient understands diagnosis: Yes. Discussing patient identified problems/goals with staff: Yes. Medical problems stabilized or resolved: Yes. Denies suicidal/homicidal ideation: Yes. Issues/concerns per patient self-inventory: No. Other: n/a  New problem(s) identified: None identified at this time.   New Short Term/Long Term Goal(s): None identified at this time.   Discharge Plan or Barriers: [facility]   Reason for Continuation of Hospitalization: Delusions  Hallucinations Medication stabilization  Estimated Length of Stay: 7 days.   Attendees: Patient: Jessica ChaletJennifer L Lehenbauer 03/16/2016  2:19 PM  Physician: Dr. Kristine Linea, MD 03/16/2016 2:19 PM  Nursing: Hulan Amato, RN 03/16/2016 2:19 PM  RN Care Manager: 03/16/2016 2:19 PM  Social Worker: Hampton Abbot, LCSWA 03/16/2016 2:19 PM  Recreational Therapist: Jacquelynn Cree, LRT/CTRS 03/16/2016 2:19 PM  Other:  03/16/2016 2:19 PM  Other:  03/16/2016 2:19 PM  Other: 03/16/2016 2:19 PM    Scribe for Treatment Team: Arelia Longest, LCSWA 03/16/2016 2:22 PM

## 2016-03-16 NOTE — Plan of Care (Signed)
Problem: Role Relationship: Goal: Ability to communicate needs accurately will improve Outcome: Not Progressing Patient is reluctant to talk to staff.

## 2016-03-16 NOTE — [facility] Group Notes (Signed)
[facility] LCSW Group Therapy Note  Date/Time: 03/16/16 1300  Type of Therapy and Topic:  Group Therapy:  Feelings around Relapse and Recovery  Participation Level:  Did Not Attend   Mood:  Description of Group:    Patients in this group will discuss emotions they experience before and after a relapse. They will process how experiencing these feelings, or avoidance of experiencing them, relates to having a relapse. Facilitator will guide patients to explore emotions they have related to recovery. Patients will be encouraged to process which emotions are more powerful. They will be guided to discuss the emotional reaction significant others in their lives may have to patients' relapse or recovery. Patients will be assisted in exploring ways to respond to the emotions of others without this contributing to a relapse.  Therapeutic Goals: 1. Patient will identify two or more emotions that lead to relapse for them:  2. Patient will identify two emotions that result when they relapse:  3. Patient will identify two emotions related to recovery:  4. Patient will demonstrate ability to communicate their needs through discussion and/or role plays.   Summary of Patient Progress:     Therapeutic Modalities:   Cognitive Behavioral Therapy Solution-Focused Therapy Assertiveness Training Relapse Prevention Therapy  Daleen SquibbGreg Elna Radovich, LCSW

## 2016-03-16 NOTE — Plan of Care (Signed)
Problem: Activity: Goal: Sleeping patterns will improve Outcome: Progressing Patient slept for Estimated Hours of 8.30; every 15 minutes safety round maintained, no injury or falls during this shift.    

## 2016-03-16 NOTE — Progress Notes (Signed)
Recreation Therapy Notes  Date: 01.19.18 Time: 9:30 am Location: Craft Room  Group Topic: Coping Skills  Goal Area(s) Addresses:  Patient will participate in healthy coping skill. Patient will verbalize benefit of using art as a coping skill.  Behavioral Response: Did not attend  Intervention: Coloring  Activity: Patients were given coloring sheets and were instructed to think about what emotions they were feeling and what their minds were focused on.  Education: LRT educated patients on healthy coping skills.  Education Outcome: Patient did not attend group.  Clinical Observations/Feedback: Patient did not attend group.  Ithzel Fedorchak M, LRT/CTRS 03/16/2016 10:17 AM 

## 2016-03-16 NOTE — Progress Notes (Signed)
Patient ID: Jessica [name], female   DOB: 08/03/1975, 41 y.o.   MRN: [id number] Isolated to room, did not want to talk or be bothered; spontaneously responded to prompt/knock on the door but closed eyes; refused to answer basic LOC/MME assessment; no medications schedule at bedtime.

## 2016-03-16 NOTE — Progress Notes (Signed)
[facility] Progress Note  03/16/2016 7:49 PM Jessica [name]  MRN:  [id number] Subjective:  Patient is a 41 year old Caucasian female with history of schizoaffective disorder bipolar type. She was brought in by police to [facility] department on January 3rd. Per the emergency departments note patient's mother called EMS after the patient grabbed her arm and wouldn't let go. Upon EMS arrival the patient was standing aggressively in the doorway and saying "[place]. [place] too much [place] She was uncooperative with evaluation yelling occasionally. Nurses in the ER described her as agitated and refusing to dressed out in his scrubs tangential.  It has been reported patient was noncompliant with her medications.  Per chart review she was hospitalized twice last year in behavioral health [place]. Her last admission was in June 2017. She was discharged with a diagnosis of schizoaffective disorder bipolar type. During her stay in the hospital she was placed on known emergency forced medications.  1/5 patient refused all medications yesterday. She had a Manual hold on 1/4 after she refused to leave the office where she was being interviewed. Today she has been started unknown emergency forced medications. She refuses oral olanzapine earlier this morning but took it later on. Patient has been refusing to eat or drink anything. She has been in bed all day. She refused to speak with me during assessment today her only answer was "I have no comment". She refused to open her mouth to evaluate for dehydration.  1/8 patient was more cooperative and engaging assessment today. She tells me she's been diagnosed with bipolar disorder and was prescribed with Trileptal and Abilify. She wonders if she can take these medications again as they help her. Patient tells me she has slept well over the last 2 nights. She is not having homicidal or suicidal ideation. She has been having visual  hallucinations which she sees as periods she says she does not feel threatened or scared when she sees the spirit. As far as side effects that she felt little lightheaded this morning were no other side effects. Last week she was refusing to eat now she is saying she is feels her appetite is very low but she tried to eat some last night. She tells me she's been drinking ensures and Gatorades.   She has been compliant with most medications but has refused Depakote. This morning she only took 2.5 mg of olanzapine not the whole dose order which was 10 mg.    1/9 No much improvement. She refused to speak with me today she asked me to step out of the room. Did not answer any of my questions.  Oral intake is is still poor looks like she is having only one meal per day. She has been drinking ensures and gatorades. Vital signs are within the normal limits. She complied with medications yesterday after antipsychotic was changed from olanzapine to Abilify. Per nursing notes she slept 7 hours  1/10 patient has been compliant with Abilify and Trileptal. Yesterday she took 2 doses of Ativan 2 mg which appeared effective. Patient was seen in the evening in the dayroom having dinner. He was documented the patient ate 100% of her dinner. She also cooperated with labs yesterday. Unfortunately this morning she already refused Ativan. Per nursing staff she has been is sleeping well. She continues to have poor oral intake. Usually only eating one meal per day however she has been drinking appropriately. We completed a basic metabolic panel which was normal.  Her vital signs have been normal.   patient has been uncooperative for the last 2 days. She is always lying in bed covered with blankets. She does not talk to me or open her eyes. She told me yesterday to step out of her room. Today she told me she did not wanted to talk and wanted to end the conversation. Per nursing staff she is eating 1 meal a day, about 50% of that  meeting is what she actually eats. She has been drinking fluids. She is compliant with medications. She is allowing vital signs and they have been a stable. I will order labs today.  1/11 patient refused Ativan this morning. She only took 20 mg of Abilify. She is actually prescribed with 30 mg the patient says that it was too much. She appeared a little bit improved today. She actually sat on the bed and talk to me however she is very paranoid. She refuses to answer certain questions. Eventually when encouraged to take medications she told me that I needed to go with God. Patient tells me she was planning on stopping her fasting today.  1/12 no much improvement. Patient has to be reminded about the known emergency forced medication orders for her to comply. Yesterday she had 2 meals, she ate 30% of her lunch and 90% of her dinner. Her vital signs have been within the normal limits. Patient says that she is not longer having auditory hallucinations. She tells me that her mood has improved and is now "neutral". She complains of feeling dizzy. Denies any other issues or concerns. She was only partially cooperative during assessment. She was lying in bed covered with blankets and talk to me with her eyes closed.  Per nursing: Patient took medications this morning as they were prescribed. She however complaining about any had to be reminded that if she didn't comply with oral medication she had orders for injectable medications. Staff reported that oral intake appears to be improving. Patient continues to be paranoid, bizarre and hyperreligious  Patient seen today. She was in bed and would not respond to me. I shook her quite a bit on the shoulder and it was clear to me that she was intentionally ignoring me. Eventually she did respond and said that she wanted to just sleep some more. She is staying isolated and does not engage in groups. Minimal cooperation with treatment..  Follow-up for the 14th. Patient  was still in bed but she was more easily arousable and she sat up and spoke to me. Seems a little bit disheveled and flat in her affect. Vague in her speech and not able to give a very clear indication of why she is in the hospital. Denies feeling suicidal denies having any hallucinations. Nursing notes that she appears to be oversedated from some of her medication.  03/12/2016. Jessica [name] is in bed after lunch. She does not open her eyes for me. She feels "so, so" and "does not know yet" what is wrong. She accepts medications. No group participation. No somatic complaints.   03/13/2016. There is absolutely no change since yesterday. Continue current treatment.  03/14/2016. Jessica [name] remains psychotic, disorganized, hyperreligous, isolative. She now accepts medications. She comes to medication room for meds and to dining area for food only. Otherwise, she is in bed with her eyes closed. Poor hygiene. No somatic complaitns. Complains of poor sleep last night. No group participation.   03/15/2016. Jessica [name] recently responded to my questions. She said that she  was doing better. But then closed her eyes and did not answer any further questions. She is aware of her surroundings. The first time I went to her room this morning she was in the bathroom and was able to talk to me through the curtain. She did not voice any somatic complaints. There were no complaints of side effects. I started Haldol last night to address psychotic symptoms. She stays secluded to her room and to her bed with her eyes closed. She does not interact with peers or staff but comes out for medications and meals.   03/16/2016 Jessica [name] again refused to participate in treatment team meeting. She refused to talk to the nurse. She was the most, so far, interactive with me. With her eyes closed and without any motion, she reported that she took a shower, went for a walk, ate breakfast and , yes, has been taking medications. She slept better.  When asked if the medications are right, she said "no". When asked which medications she would prefer, she named Trileptal and Abilify. She indeed is on Trileptal and Abilify. I added Haldol just recently.  Per nursing: Isolated to room, did not want to talk or be bothered; spontaneously responded to prompt/knock on the door but closed eyes; refused to answer basic LOC/MME assessment; no medications schedule at bedtime.  Principal Problem: Schizoaffective disorder, bipolar type (HCC) Diagnosis:   Patient Active Problem List   Diagnosis Date Noted  . Schizoaffective disorder, bipolar type (HCC) [F25.0] 07/25/2015   Total Time spent with patient: 20 minutes  Past Psychiatric History: Patient has been hospitalized several times at behavioral health in [place]. She was just discharged in July 2017.  I don't have any information regarding prior suicidal attempts or self-injurious behaviors   Past Medical History:  Past Medical History:  Diagnosis Date  . Anxiety   . Bipolar 1 disorder (HCC)   . Depression   . Schizophrenia (HCC)    History reviewed. No pertinent surgical history.  Family History:  Family History  Problem Relation Age of Onset  . Hypertension Mother   . Mental illness Father    Family Psychiatric  History: Unknown at this point as patient is uncooperative   Social History: Not much information is available at this time other than she lives with her mother in [place]. 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 Marital status: Single    Spouse name: N/A  . Number of children: N/A  . Years of education: N/A   Social History Main Topics  . Smoking status: Never Smoker  . Smokeless tobacco: Never Used  . Alcohol use No  . Drug use: No  . Sexual activity: No   Other Topics Concern  . None   Social History Narrative  .  None   Additional Social History:    Pain Medications: none Prescriptions: none Over the Counter: none History of alcohol / drug use?: No history of alcohol / drug abuse Negative Consequences of Use: Personal relationships, Financial, Work / School Withdrawal Symptoms: Other (Comment)      Current Medications: Current Facility-Administered Medications  Medication Dose Route Frequency Provider Last Rate Last Dose  . acetaminophen (TYLENOL) tablet 650 mg  650 mg Oral Q6H PRN Jimmy Footman, MD      . alum & mag hydroxide-simeth (MAALOX/MYLANTA)  200-200-20 MG/5ML suspension 30 mL  30 mL Oral Q4H PRN Jimmy Footman, MD      . ARIPiprazole (ABILIFY) tablet 30 mg  30 mg Oral Daily Jimmy Footman, MD   30 mg at 03/16/16 0758  . OLANZapine (ZYPREXA) injection 10 mg  10 mg Intramuscular Daily Jimmy Footman, MD       Or  . ARIPiprazole (ABILIFY) tablet 30 mg  30 mg Oral Daily Jimmy Footman, MD   30 mg at 03/14/16 0802  . feeding supplement (ENSURE ENLIVE) (ENSURE ENLIVE) liquid 237 mL  237 mL Oral TID BM Jimmy Footman, MD   237 mL at 03/16/16 1400  . haloperidol (HALDOL) tablet 5 mg  5 mg Oral BID Shari Prows, MD   5 mg at 03/16/16 1757  . magnesium hydroxide (MILK OF MAGNESIA) suspension 30 mL  30 mL Oral Daily PRN Jimmy Footman, MD      . multivitamin with minerals tablet 1 tablet  1 tablet Oral Daily Jimmy Footman, MD   1 tablet at 03/16/16 0758  . Oxcarbazepine (TRILEPTAL) tablet 300 mg  300 mg Oral BID Jimmy Footman, MD   300 mg at 03/16/16 1757    Lab Results:  No results found for this or any previous visit (from the past 48 hour(s)).  Blood Alcohol level:  Lab Results  Component Value Date   ETH <5 02/29/2016   ETH <5 08/03/2015    Metabolic Disorder Labs: Lab Results  Component Value Date   HGBA1C 5.4 03/07/2016   MPG 108 03/07/2016   MPG 117 07/27/2015   Lab  Results  Component Value Date   PROLACTIN 20.7 07/27/2015   Lab Results  Component Value Date   CHOL 165 03/07/2016   TRIG 67 03/07/2016   HDL 48 03/07/2016   CHOLHDL 3.4 03/07/2016   VLDL 13 03/07/2016   LDLCALC 104 (H) 03/07/2016   LDLCALC 95 07/27/2015    Physical Findings: AIMS:  , ,  ,  ,    CIWA:    COWS:  COWS Total Score: 1  Musculoskeletal: Strength & Muscle Tone: within normal limits Gait & Station: normal Patient leans: N/A  Psychiatric Specialty Exam: Physical Exam  Nursing note and vitals reviewed. Constitutional: She is oriented to person, place, and time. She appears well-developed and well-nourished.  HENT:  Head: Normocephalic and atraumatic.  Eyes: Conjunctivae and EOM are normal.  Neck: Normal range of motion.  Respiratory: Effort normal.  Musculoskeletal: Normal range of motion.  Neurological: She is alert and oriented to person, place, and time.    Review of Systems  Unable to perform ROS: Acuity of condition  Psychiatric/Behavioral: Positive for hallucinations.  All other systems reviewed and are negative.   Blood pressure 122/79, pulse 90, temperature 98.1 F (36.7 C), temperature source Oral, resp. rate 18, height 5\' 4"  (1.626 m), weight 90.7 kg (200 lb), last menstrual period 02/27/2016, SpO2 99 %.Body mass index is 34.33 kg/m.  General Appearance: Disheveled  Eye Contact:  Minimal  Speech:  Normal Rate  Volume:  Normal  Mood:  Irritable  Affect:  Constricted  Thought Process:  Disorganized and Descriptions of Associations: Loose  Orientation:  Full (Time, Place, and Person)  Thought Content:  Delusions  Suicidal Thoughts:  No  Homicidal Thoughts:  No  Memory:  Immediate;   Poor Recent;   Poor Remote;   Poor  Judgement:  Impaired  Insight:  Lacking  Psychomotor Activity:  Decreased  Concentration:  Concentration: Poor and Attention Span:  Poor  Recall:  Poor  Fund of Knowledge:  Poor  Language:  Fair  Akathisia:  No  Handed:     AIMS (if indicated):     Assets:  Communication Skills Leisure Time  ADL's:  Intact  Cognition:  Impaired,  Mild  Sleep:  Number of Hours: 6     Treatment Plan Summary:  No improvement since admission. Basically nonverbal. Withdrawn to her room minimal oral intake. Only eating one meal a day and Ativan in she is only eating about 50%. She has been drinking ensures per nursing staff.  Patient is a 41 year old Caucasian female with history of schizoaffective bipolar type. He presented to the emergency department due to aggression, agitation, and psychosis. Patient has not been compliant with medications.  For schizoaffective disorder: Patient has been lying in bed all day. She is not attending any programming. She is refusing to speak to me. She refused to speak with me and the Child psychotherapist. She has been invited to participate in treatment team twice but she has declined.  Patient has been is started on on Abilify and Trileptal 300 mg twice a day.  Plan to titrate up Abilify and offer Abilify injectable. Abilify was chosen as patient requested to take it and the patient's mother reported patient had had a good response to Abilify in the past. The patient has been taking Abilify since Jan 5th. Not much improvement and has yet been sneaking when my need to consider the possibility of switching to a different antipsychotic. Patient initially received olanzapine in these medications seemed to help her. The other possibility would be to consider Hinda Glatter (which has a long acting injectable) as the patient has history of being noncompliant.   Continue abilify to 30 mg. She refuses to take full dose of Abilify on 1/11. She was only willing to take 20 mg.Orders for Non emergency forced medications again were placed again. If patient refuses full ose of Abilify and she will receive olanzapine IM.  For possible catatonia patient started on Ativan 2 mg tid.----Appears to come out of her room more often  when after she takes Ativan.  For agitation: continue Ativan 2 mg every 4 hours as needed  Patient require Manual Hold on 1/4 as she refused to leave the office where I was interviewing her on 1/4.   Ordered multivitamins  Diet regular  Vital signs daily  Continue to push fluids. Ordered ensure tid  Precautions every 15 minute checks  Hospitalization status continue involuntary commitment------ referral has been made to [facility].  We will provide the mother with a letter recommending her to become legal guardian. Letter recommending guardian she was mailed to the patient's mother on 1/11.   Labs: TSH, hemoglobin A1c and lipid panel, b12, BMP--has refused all labs---finally cooperative with labs on 1/10.  All wnl  Collateral information has been  obtained from the patient's mother. Patient has declined for [place] to have any contact with the mother however at this point in time she does not have capacity to make that decision. She is very psychotic. The mother will be contacted again today. We plan to discuss with her the need of guardianship in this case. This patient has refused to apply for Medicaid or disability. She isn't paranoid and unable to work. Mother feels very unsafe with her around the house as patient is noncompliant with medication. Patient displayed aggression towards the family prior to admission. They do not plan for her would be to have  potentially group home placement, and act team and a guardian and I will agree for a long acting injectable.   Spoke with the patient's mother again on 1/11. We we will write a letter for deal for [place] court supporting the need for the patient to have a guardian. The letter will be given mail to his mother. She plans to go to the court house next week and apply for guardianship.   Mother will call SW for updates on Wednesday 1/17 at 2 pm.  Mother plans to take letter to court house in [place] on Tuesday of next week.   Continue psychiatric medicine for bipolar disorder. Supportive review of plan with patient even though she was not really participating with it. No other change to medicine today.  Patient is blunted. Doesn't involve herself much on the unit. Minimal insight. After discussion with nursing I did cut down her Ativan standing doses by quite a bit. No other change to medication. Encourage patient to get out of her room and participate.  03/12/2016 No medication changes  03/13/2016 No medication changes.  03/14/2016 I will add Haldol 5 mg bid for psychosis.  03/15/2016 No medication changes.  03/16/2016 No medication changes.   Kristine Linea, MD 03/16/2016, 7:49 PM

## 2016-03-16 NOTE — Progress Notes (Signed)
Patient continues to be guarded,stated "If you are not a doctor I don't want to talk to you."Stated that she feels "so so".Stayed in bed most of the time.Compliant with medications.Appetite & energy level good.

## 2016-03-17 NOTE — Progress Notes (Signed)
Denies SI/HI/AVH.  Isolative.  Ritualistic.  Avoids eye contact.  Forwards little.  Support and encouragement offered.  Safety maintained.

## 2016-03-17 NOTE — Progress Notes (Signed)
[facility] MD Progress Note  03/17/2016 12:18 PM Jessica [name]  MRN:  [id number] Subjective:  Patient is a 41 year old Caucasian female with history of schizoaffective disorder bipolar type. She was brought in by police to [facility] emergency department on January 3rd. Per the emergency departments note patient's mother called EMS after the patient grabbed her arm and wouldn't let go. Upon EMS arrival the patient was standing aggressively in the doorway and saying "[place], [place]. There is too much evil in [place]". She was uncooperative with evaluation yelling occasionally. Nurses in the ER described her as agitated and refusing to dressed out in his scrubs tangential.  It has been reported patient was noncompliant with her medications.  Per chart review she was hospitalized twice last year in behavioral health [place]. Her last admission was in June 2017. She was discharged with a diagnosis of schizoaffective disorder bipolar type. During her stay in the hospital she was placed on known emergency forced medications.  1/5 patient refused all medications yesterday. She had a Manual hold on 1/4 after she refused to leave the office where she was being interviewed. Today she has been started unknown emergency forced medications. She refuses oral olanzapine earlier this morning but took it later on. Patient has been refusing to eat or drink anything. She has been in bed all day. She refused to speak with me during assessment today her only answer was "I have no comment". She refused to open her mouth to evaluate for dehydration.  1/8 patient was more cooperative and engaging assessment today. She tells me she's been diagnosed with bipolar disorder and was prescribed with Trileptal and Abilify. She wonders if she can take these medications again as they help her. Patient tells me she has slept well over the last 2 nights. She is not having homicidal or suicidal ideation. She has been having visual  hallucinations which she sees as periods she says she does not feel threatened or scared when she sees the spirit. As far as side effects that she felt little lightheaded this morning were no other side effects. Last week she was refusing to eat now she is saying she is feels her appetite is very low but she tried to eat some last night. She tells me she's been drinking ensures and Gatorades.   She has been compliant with most medications but has refused Depakote. This morning she only took 2.5 mg of olanzapine not the whole dose order which was 10 mg.    1/9 No much improvement. She refused to speak with me today she asked me to step out of the room. Did not answer any of my questions.  Oral intake is is still poor looks like she is having only one meal per day. She has been drinking ensures and gatorades. Vital signs are within the normal limits. She complied with medications yesterday after antipsychotic was changed from olanzapine to Abilify. Per nursing notes she slept 7 hours  1/10 patient has been compliant with Abilify and Trileptal. Yesterday she took 2 doses of Ativan 2 mg which appeared effective. Patient was seen in the evening in the dayroom having dinner. He was documented the patient ate 100% of her dinner. She also cooperated with labs yesterday. Unfortunately this morning she already refused Ativan. Per nursing staff she has been is sleeping well. She continues to have poor oral intake. Usually only eating one meal per day however she has been drinking appropriately. We completed a basic metabolic panel which was normal.  Her vital signs have been normal.   patient has been uncooperative for the last 2 days. She is always lying in bed covered with blankets. She does not talk to me or open her eyes. She told me yesterday to step out of her room. Today she told me she did not wanted to talk and wanted to end the conversation. Per nursing staff she is eating 1 meal a day, about 50% of that  meeting is what she actually eats. She has been drinking fluids. She is compliant with medications. She is allowing vital signs and they have been a stable. I will order labs today.  1/11 patient refused Ativan this morning. She only took 20 mg of Abilify. She is actually prescribed with 30 mg the patient says that it was too much. She appeared a little bit improved today. She actually sat on the bed and talk to me however she is very paranoid. She refuses to answer certain questions. Eventually when encouraged to take medications she told me that I needed to go with God. Patient tells me she was planning on stopping her fasting today.  1/12 no much improvement. Patient has to be reminded about the known emergency forced medication orders for her to comply. Yesterday she had 2 meals, she ate 30% of her lunch and 90% of her dinner. Her vital signs have been within the normal limits. Patient says that she is not longer having auditory hallucinations. She tells me that her mood has improved and is now "neutral". She complains of feeling dizzy. Denies any other issues or concerns. She was only partially cooperative during assessment. She was lying in bed covered with blankets and talk to me with her eyes closed.  Per nursing: Patient took medications this morning as they were prescribed. She however complaining about any had to be reminded that if she didn't comply with oral medication she had orders for injectable medications. Staff reported that oral intake appears to be improving. Patient continues to be paranoid, bizarre and hyperreligious  Patient seen today. She was in bed and would not respond to me. I shook her quite a bit on the shoulder and it was clear to me that she was intentionally ignoring me. Eventually she did respond and said that she wanted to just sleep some more. She is staying isolated and does not engage in groups. Minimal cooperation with treatment..  Follow-up for the 14th. Patient  was still in bed but she was more easily arousable and she sat up and spoke to me. Seems a little bit disheveled and flat in her affect. Vague in her speech and not able to give a very clear indication of why she is in the hospital. Denies feeling suicidal denies having any hallucinations. Nursing notes that she appears to be oversedated from some of her medication.  03/12/2016. Ms. Braud is in bed after lunch. She does not open her eyes for me. She feels "so, so" and "does not know yet" what is wrong. She accepts medications. No group participation. No somatic complaints.   03/13/2016. There is absolutely no change since yesterday. Continue current treatment.  03/14/2016. Ms. Dimitri remains psychotic, disorganized, hyperreligous, isolative. She now accepts medications. She comes to medication room for meds and to dining area for food only. Otherwise, she is in bed with her eyes closed. Poor hygiene. No somatic complaitns. Complains of poor sleep last night. No group participation.   03/15/2016. Ms. Wiatrek recently responded to my questions. She said that she  was doing better. But then closed her eyes and did not answer any further questions. She is aware of her surroundings. The first time I went to her room this morning she was in the bathroom and was able to talk to me through the curtain. She did not voice any somatic complaints. There were no complaints of side effects. I started Haldol last night to address psychotic symptoms. She stays secluded to her room and to her bed with her eyes closed. She does not interact with peers or staff but comes out for medications and meals.   03/16/2016 Ms. Kerth again refused to participate in treatment team meeting. She refused to talk to the nurse. She was the most, so far, interactive with me. With her eyes closed and without any motion, she reported that she took a shower, went for a walk, ate breakfast and , yes, has been taking medications. She slept better.  When asked if the medications are right, she said "no". When asked which medications she would prefer, she named Trileptal and Abilify. She indeed is on Trileptal and Abilify. I added Haldol just recently.  03/17/2016 The patient was initially reluctant to talk to this writer and is due from [place] [facility] program but then eventually did speak to this Clinical research associate. She did not appear like she was responding to internal stimuli but stated that she was hearing voices that were mild in nature. She refused to elaborate give any details on the content of the voices. She denied ever telling her to hurt herself or anyone else. She denies any current active or passive suicidal thoughts but mood has remained depressed. She says she has been repeatedly praying and exercising. He said she did get off any breakfast brush her teeth this morning. She was compliant with all psychotropic medications except for a multivitamin. She has not been going to groups and has been fairly effective to her room. She denies any new somatic complaints. She denies any physical adverse side effects associate with medication. No signs of EPS. She has not been interacting with staff or peers.    Principal Problem: Schizoaffective disorder, bipolar type (HCC) Diagnosis:   Patient Active Problem List   Diagnosis Date Noted  . Schizoaffective disorder, bipolar type (HCC) [F25.0] 07/25/2015   Total Time spent with patient: 20 minutes  Past Psychiatric History: Patient has been hospitalized several times at behavioral health in [place]. She was just discharged in July 2017.  I don't have any information regarding prior suicidal attempts or self-injurious behaviors   Past Medical History:  Past Medical History:  Diagnosis Date  . Anxiety   . Bipolar 1 disorder (HCC)   . Depression   . Schizophrenia (HCC)    History reviewed. No pertinent surgical history.  Family History:  Family History  Problem Relation Age of Onset  .  Hypertension Mother   . Mental illness Father    Family Psychiatric  History: Unknown at this point as patient is uncooperative   Social History: Not much information is available at this time other than she lives with her mother in [place]. 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 Marital status: Single    Spouse name: N/A  . Number of children: N/A  . Years of education: N/A   Social History Main Topics  .  Smoking status: Never Smoker  . Smokeless tobacco: Never Used  . Alcohol use No  . Drug use: No  . Sexual activity: No   Other Topics Concern  . None   Social History Narrative  . None   Additional Social History:    Pain Medications: none Prescriptions: none Over the Counter: none History of alcohol / drug use?: No history of alcohol / drug abuse Negative Consequences of Use: Personal relationships, Financial, Work / School Withdrawal Symptoms: Other (Comment)      Current Medications: Current Facility-Administered Medications  Medication Dose Route Frequency Provider Last Rate Last Dose  . acetaminophen (TYLENOL) tablet 650 mg  650 mg Oral Q6H PRN Jimmy FootmanAndrea Hernandez-Gonzalez, MD      . alum & mag hydroxide-simeth (MAALOX/MYLANTA) 200-200-20 MG/5ML suspension 30 mL  30 mL Oral Q4H PRN Jimmy FootmanAndrea Hernandez-Gonzalez, MD      . ARIPiprazole (ABILIFY) tablet 30 mg  30 mg Oral Daily Jimmy FootmanAndrea Hernandez-Gonzalez, MD   30 mg at 03/17/16 0800  . OLANZapine (ZYPREXA) injection 10 mg  10 mg Intramuscular Daily Jimmy FootmanAndrea Hernandez-Gonzalez, MD       Or  . ARIPiprazole (ABILIFY) tablet 30 mg  30 mg Oral Daily Jimmy FootmanAndrea Hernandez-Gonzalez, MD   30 mg at 03/14/16 0802  . feeding supplement (ENSURE ENLIVE) (ENSURE ENLIVE) liquid 237 mL  237 mL Oral TID BM Jimmy FootmanAndrea Hernandez-Gonzalez, MD   237 mL at 03/17/16 1000  . haloperidol (HALDOL) tablet 5 mg  5 mg Oral BID  Jolanta B Pucilowska, MD   5 mg at 03/17/16 0800  . magnesium hydroxide (MILK OF MAGNESIA) suspension 30 mL  30 mL Oral Daily PRN Jimmy FootmanAndrea Hernandez-Gonzalez, MD      . multivitamin with minerals tablet 1 tablet  1 tablet Oral Daily Jimmy FootmanAndrea Hernandez-Gonzalez, MD   1 tablet at 03/16/16 0758  . Oxcarbazepine (TRILEPTAL) tablet 300 mg  300 mg Oral BID Jimmy FootmanAndrea Hernandez-Gonzalez, MD   300 mg at 03/17/16 0800    Lab Results:  No results found for this or any previous visit (from the past 48 hour(s)).  Blood Alcohol level:  Lab Results  Component Value Date   ETH <5 02/29/2016   ETH <5 08/03/2015    Metabolic Disorder Labs: Lab Results  Component Value Date   HGBA1C 5.4 03/07/2016   MPG 108 03/07/2016   MPG 117 07/27/2015   Lab Results  Component Value Date   PROLACTIN 20.7 07/27/2015   Lab Results  Component Value Date   CHOL 165 03/07/2016   TRIG 67 03/07/2016   HDL 48 03/07/2016   CHOLHDL 3.4 03/07/2016   VLDL 13 03/07/2016   LDLCALC 104 (H) 03/07/2016   LDLCALC 95 07/27/2015    Physical Findings: AIMS:  , ,  ,  ,    CIWA:    COWS:  COWS Total Score: 1  Musculoskeletal: Strength & Muscle Tone: within normal limits Gait & Station: normal Patient leans: N/A  Psychiatric Specialty Exam: Physical Exam  Nursing note and vitals reviewed. Constitutional: She is oriented to person, place, and time. She appears well-developed and well-nourished.  HENT:  Head: Normocephalic and atraumatic.  Eyes: Conjunctivae and EOM are normal.  Neck: Normal range of motion.  Respiratory: Effort normal.  Musculoskeletal: Normal range of motion.  Neurological: She is alert and oriented to person, place, and time.    Review of Systems  Unable to perform ROS: Acuity of condition  Constitutional: Negative.   HENT: Negative.   Eyes: Negative.   Respiratory: Negative.  Cardiovascular: Negative.   Gastrointestinal: Negative.   Genitourinary: Negative.   Musculoskeletal: Negative.    Skin: Negative.   Neurological: Negative.   Endo/Heme/Allergies: Negative.   Psychiatric/Behavioral: Positive for hallucinations.  All other systems reviewed and are negative.   Blood pressure 114/74, pulse 82, temperature 98 F (36.7 C), temperature source Oral, resp. rate 18, height 5\' 4"  (1.626 m), weight 90.7 kg (200 lb), last menstrual period 02/27/2016, SpO2 99 %.Body mass index is 34.33 kg/m.  General Appearance: Disheveled  Eye Contact:  Minimal  Speech:  Normal Rate  Volume:  Normal  Mood:  Irrtiablte and defensive  Affect:  Constricted  Thought Process:  Disorganized and Descriptions of Associations: Loose  Orientation:  Full (Time, Place, and Person)  Thought Content:  Delusions  Suicidal Thoughts:  No  Homicidal Thoughts:  No  Memory:  Immediate;   Poor Recent;   Poor Remote;   Poor  Judgement:  Impaired  Insight:  Lacking  Psychomotor Activity:  Decreased  Concentration:  Concentration: Poor and Attention Span: Poor  Recall:  Poor  Fund of Knowledge:  Poor  Language:  Fair  Akathisia:  No  Handed:    AIMS (if indicated):     Assets:  Communication Skills Leisure Time  ADL's:  Intact  Cognition:  Impaired,  Mild  Sleep:  Number of Hours: 7     Treatment Plan Summary:  No improvement since admission. Basically nonverbal. Withdrawn to her room minimal oral intake. Only eating one meal a day and Ativan in she is only eating about 50%. She has been drinking ensures per nursing staff.  Patient is a 41 year old Caucasian female with history of schizoaffective bipolar type. He presented to the emergency department due to aggression, agitation, and psychosis. Patient has not been compliant with medications.  For schizoaffective disorder: The patient has not been interacting much with staff or peers and has been isolative to her room. Patient has been is started on on Abilify and Trileptal 300 mg twice a day.  Plan to titrate up Abilify and offer Abilify  injectable. Abilify was chosen as patient requested to take it and the patient's mother reported patient had had a good response to Abilify in the past. The patient has been taking Abilify since Jan 5th. Not much improvement and has yet been sneaking when my need to consider the possibility of switching to a different antipsychotic. Patient initially received olanzapine in these medications seemed to help her. The other possibility would be to consider Hinda Glatter (which has a long acting injectable) as the patient has history of being noncompliant.   Continue abilify to 30 mg. She refuses to take full dose of Abilify on 1/11. She was only willing to take 20 mg.Orders for Non emergency forced medications again were placed again. She was compliant with all medications on the morning of 1/201/8.  For possible catatonia patient started on Ativan 2 mg tid.----Appears to come out of her room more often when after she takes Ativan.  For agitation: continue Ativan 2 mg every 4 hours as needed  Patient require Manual Hold on 1/4 as she refused to leave the office where she was being interviewed on 1/4.   Ordered multivitamins  Diet regular  Vital signs daily  Continue to push fluids. Ordered ensure tid  Precautions every 15 minute checks  Hospitalization status continue involuntary commitment------ referral has been [facility] to [facility]  We will provide the mother with a letter recommending her to become legal guardian.  Letter recommending guardian she was mailed to the patient's mother on 1/11.   Labs: TSH, hemoglobin A1c and lipid panel, b12, BMP--has refused all labs---finally cooperative with labs on 1/10.  All wnl  Collateral information has been  obtained from the patient's mother. Patient has declined for [place] to have any contact with the mother however at this point in time she does not have capacity to make that decision. She is very psychotic. The mother will be contacted  again today. We plan to discuss with her the need of guardianship in this case. This patient has refused to apply for Medicaid or disability. She isn't paranoid and unable to work. Mother feels very unsafe with her around the house as patient is noncompliant with medication. Patient displayed aggression towards the family prior to admission. They do not plan for her would be to have potentially group home placement, and act team and a guardian and I will agree for a long acting injectable.   Spoke with the patient's mother again on 1/11. We we will write a letter for deal for [place] court supporting the need for the patient to have a guardian. The letter will be given mail to his mother. She plans to go to the court house next week and apply for guardianship.   Mother will call SW for updates on Wednesday 1/17 at 2 pm.  Mother plans to take letter to court house in [place] on Tuesday of next week.  Continue psychiatric medicine for bipolar disorder. Supportive review of plan with patient even though she was not really participating with it. No other change to medicine today.   03/12/2016 No medication changes  03/13/2016 No medication changes.  03/14/2016 Added Haldol 5 mg bid for psychosis.  03/15/2016 No medication changes.  03/16/2016 No medication changes.  03/17/2016: No medication changes but she did take all medications in the morning   Levora Angel, MD 03/17/2016, 12:18 PM

## 2016-03-17 NOTE — Progress Notes (Signed)
  D: Pt denies SI/HI/AVH, but noted responding to internal stimuli. Patient is suspicions of staff. Patient was noted to be hyper religious and performing some rituals on the hallway, Patient's thoughts are disorganized and sometimes incoherent. Patient is less intrusive, she appears less anxious, not interacting with peers and staff appropriately.   A: Pt was offered support and encouragement. Pt was encouraged to attend group. 15 minute checks were done for safety.  R:Pt did not attend group. 15 minutes checks maintained for safety. Will continue to closely monitor.

## 2016-03-18 NOTE — Progress Notes (Signed)
D: Patient is alert and oriented on the unit this shift. Patient not attended and actively participated in groups today. Patient denies suicidal ideation, homicidal ideation, auditory or visual hallucinations at the present time.  A: Scheduled medications are administered to patient as per MD orders. Emotional support and encouragement are provided. Patient is maintained on q.15 minute safety checks. Patient is informed to notify staff with questions or concerns. R: No adverse medication reactions are noted. Patient is not cooperative with medication administration and treatment plan today. Patient is non receptive, calm and  Non cooperative on the unit at this time. Patient does not interact  with others on the unit this shift. Patient contracts for safety at this time. Patient remains safe at this time. Depression  And anxiety level not able to obtain patient does not answer

## 2016-03-18 NOTE — [facility] Group Notes (Signed)
[facility] LCSW Group Therapy  03/18/2016 12:24 PM  Type of Therapy:  Group Therapy  Participation Level:  Patient did not attend group. CSW invited patient to group.   Summary of Progress/Problems: Emotional Regulation: Patients will identify both negative and positive emotions. They will discuss emotions they have difficulty regulating and how they impact their lives. Patients will be asked to identify healthy coping skills to combat unhealthy reactions to negative emotions.    Zaeem Kandel M. Daron Breeding, LCSW 03/18/2016  

## 2016-03-18 NOTE — Progress Notes (Addendum)
Patient with blunted affect, disheveled appearance with poor adls. Minimal interaction with peers. Isolates in room in bed. Cooperative with meals and meds. Preoccupied with own thoughts and guarded during interactions initiated by nurse. No SI/HI/AVH at this time. Safety maintained. Patient refuses meals and nutritional shakes. Drinks gatorade.

## 2016-03-18 NOTE — Progress Notes (Signed)
Dismissive of interview. Pt isolated to room majority of shift. When she was out she paced halls. Ritualistic. Avoided eye contact. Safety maintained. Voices no concerns. Will continue to monitor.

## 2016-03-18 NOTE — Progress Notes (Signed)
[facility] MD Progress Note  03/18/2016 12:47 PM Jessica [name]  MRN:  [id number] Subjective:  Patient is a 41 year old Caucasian female with history of schizoaffective disorder bipolar type. She was brought in by police to [facility] emergency department on January 3rd. Per the emergency departments note patient's mother called EMS after the patient grabbed her arm and wouldn't let go. Upon EMS arrival the patient was standing aggressively in the doorway and saying "[place], [place]. There is too much evil in [place]". She was uncooperative with evaluation yelling occasionally. Nurses in the ER described her as agitated and refusing to dressed out in his scrubs tangential.  It has been reported patient was noncompliant with her medications.  Per chart review she was hospitalized twice last year in behavioral health [place]. Her last admission was in June 2017. She was discharged with a diagnosis of schizoaffective disorder bipolar type. During her stay in the hospital she was placed on known emergency forced medications.  1/5 patient refused all medications yesterday. She had a Manual hold on 1/4 after she refused to leave the office where she was being interviewed. Today she has been started unknown emergency forced medications. She refuses oral olanzapine earlier this morning but took it later on. Patient has been refusing to eat or drink anything. She has been in bed all day. She refused to speak with me during assessment today her only answer was "I have no comment". She refused to open her mouth to evaluate for dehydration.  1/8 patient was more cooperative and engaging assessment today. She tells me she's been diagnosed with bipolar disorder and was prescribed with Trileptal and Abilify. She wonders if she can take these medications again as they help her. Patient tells me she has slept well over the last 2 nights. She is not having homicidal or suicidal ideation. She has been having visual  hallucinations which she sees as periods she says she does not feel threatened or scared when she sees the spirit. As far as side effects that she felt little lightheaded this morning were no other side effects. Last week she was refusing to eat now she is saying she is feels her appetite is very low but she tried to eat some last night. She tells me she's been drinking ensures and Gatorades.   She has been compliant with most medications but has refused Depakote. This morning she only took 2.5 mg of olanzapine not the whole dose order which was 10 mg.    1/9 No much improvement. She refused to speak with me today she asked me to step out of the room. Did not answer any of my questions.  Oral intake is is still poor looks like she is having only one meal per day. She has been drinking ensures and gatorades. Vital signs are within the normal limits. She complied with medications yesterday after antipsychotic was changed from olanzapine to Abilify. Per nursing notes she slept 7 hours  1/10 patient has been compliant with Abilify and Trileptal. Yesterday she took 2 doses of Ativan 2 mg which appeared effective. Patient was seen in the evening in the dayroom having dinner. He was documented the patient ate 100% of her dinner. She also cooperated with labs yesterday. Unfortunately this morning she already refused Ativan. Per nursing staff she has been is sleeping well. She continues to have poor oral intake. Usually only eating one meal per day however she has been drinking appropriately. We completed a basic metabolic panel which was normal.  Her vital signs have been normal.   patient has been uncooperative for the last 2 days. She is always lying in bed covered with blankets. She does not talk to me or open her eyes. She told me yesterday to step out of her room. Today she told me she did not wanted to talk and wanted to end the conversation. Per nursing staff she is eating 1 meal a day, about 50% of that  meeting is what she actually eats. She has been drinking fluids. She is compliant with medications. She is allowing vital signs and they have been a stable. I will order labs today.  1/11 patient refused Ativan this morning. She only took 20 mg of Abilify. She is actually prescribed with 30 mg the patient says that it was too much. She appeared a little bit improved today. She actually sat on the bed and talk to me however she is very paranoid. She refuses to answer certain questions. Eventually when encouraged to take medications she told me that I needed to go with God. Patient tells me she was planning on stopping her fasting today.  1/12 no much improvement. Patient has to be reminded about the known emergency forced medication orders for her to comply. Yesterday she had 2 meals, she ate 30% of her lunch and 90% of her dinner. Her vital signs have been within the normal limits. Patient says that she is not longer having auditory hallucinations. She tells me that her mood has improved and is now "neutral". She complains of feeling dizzy. Denies any other issues or concerns. She was only partially cooperative during assessment. She was lying in bed covered with blankets and talk to me with her eyes closed.  Per nursing: Patient took medications this morning as they were prescribed. She however complaining about any had to be reminded that if she didn't comply with oral medication she had orders for injectable medications. Staff reported that oral intake appears to be improving. Patient continues to be paranoid, bizarre and hyperreligious  Patient seen today. She was in bed and would not respond to me. I shook her quite a bit on the shoulder and it was clear to me that she was intentionally ignoring me. Eventually she did respond and said that she wanted to just sleep some more. She is staying isolated and does not engage in groups. Minimal cooperation with treatment..  Follow-up for the 14th. Patient  was still in bed but she was more easily arousable and she sat up and spoke to me. Seems a little bit disheveled and flat in her affect. Vague in her speech and not able to give a very clear indication of why she is in the hospital. Denies feeling suicidal denies having any hallucinations. Nursing notes that she appears to be oversedated from some of her medication.  03/12/2016. Jessica [name] is in bed after lunch. She does not open her eyes for me. She feels "so, so" and "does not know yet" what is wrong. She accepts medications. No group participation. No somatic complaints.   03/13/2016. There is absolutely no change since yesterday. Continue current treatment.  03/14/2016. Jessica [name] remains psychotic, disorganized, hyperreligous, isolative. She now accepts medications. She comes to medication room for meds and to dining area for food only. Otherwise, she is in bed with her eyes closed. Poor hygiene. No somatic complaitns. Complains of poor sleep last night. No group participation.   03/15/2016. Jessica [name] recently responded to my questions. She said that she  was doing better. But then closed her eyes and did not answer any further questions. She is aware of her surroundings. The first time I went to her room this morning she was in the bathroom and was able to talk to me through the curtain. She did not voice any somatic complaints. There were no complaints of side effects. I started Haldol last night to address psychotic symptoms. She stays secluded to her room and to her bed with her eyes closed. She does not interact with peers or staff but comes out for medications and meals.   03/16/2016 Jessica [name] again refused to participate in treatment team meeting. She refused to talk to the nurse. She was the most, so far, interactive with me. With her eyes closed and without any motion, she reported that she took a shower, went for a walk, ate breakfast and , yes, has been taking medications. She slept better.  When asked if the medications are right, she said "no". When asked which medications she would prefer, she named Trileptal and Abilify. She indeed is on Trileptal and Abilify. I added Haldol just recently.  03/17/2016 The patient was initially reluctant to talk to this writer and is due from [place] [facility] program but then eventually did speak to this Clinical research associate. She did not appear like she was responding to internal stimuli but stated that she was hearing voices that were mild in nature. She refused to elaborate give any details on the content of the voices. She denied ever telling her to hurt herself or anyone else. She denies any current active or passive suicidal thoughts but mood has remained depressed. She says she has been repeatedly praying and exercising. He said she did get off any breakfast brush her teeth this morning. She was compliant with all psychotropic medications except for a multivitamin. She has not been going to groups and has been fairly isolative to her room. She denies any new somatic complaints. She denies any physical adverse side effects associate with medication. No signs of EPS. She has not been interacting with staff or peers.  03/18/2016: The patient initially spoke more to this writer today than yesterday. She however then became mildly irritable and was refusing to answer some questions. She stated that she was feeling better overall but has continued to be isolative to her room and does not interact with staff and peers. She has been compliant with the Abilify and Trileptal but insight and judgment remain poor. The patient denied that she wanted to hurt herself or anyone else. She denied any current active or passive suicidal thoughts. She did admit to having some auditory hallucinations but could not verbalize with the voices were saying and reported that it was "private" and that she would only talk about voices with her outpatient psychiatrist. She denied any visual hallucinations. She  denied auditory hallucinations were command in nature. She denies any physical adverse side effects associated with the medication. Per nursing, she slept fairly well last night. The patient however sleeping on and off throughout the daytime. She denies any problems with her appetite and does eat her meals. No signs of EPS. Vital signs are stable.    Principal Problem: Schizoaffective disorder, bipolar type (HCC) Diagnosis:   Patient Active Problem List   Diagnosis Date Noted  . Schizoaffective disorder, bipolar type (HCC) [F25.0] 07/25/2015   Total Time spent with patient: 20 minutes  Past Psychiatric History: Patient has been hospitalized several times at behavioral health in [place]. She was  just discharged in July 2017.  I don't have any information regarding prior suicidal attempts or self-injurious behaviors   Past Medical History:  Past Medical History:  Diagnosis Date  . Anxiety   . Bipolar 1 disorder (HCC)   . Depression   . Schizophrenia (HCC)    History reviewed. No pertinent surgical history.  Family History:  Family History  Problem Relation Age of Onset  . Hypertension Mother   . Mental illness Father    Family Psychiatric  History: Unknown at this point as patient is uncooperative   Social History: Not much information is available at this time other than she lives with her mother in [place]. 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 Marital status: Single    Spouse name: N/A  . Number of children: N/A  . Years of education: N/A   Social History Main Topics  . Smoking status: Never Smoker  . Smokeless tobacco: Never Used  . Alcohol use No  . Drug use: No  . Sexual activity: No   Other Topics Concern  . None   Social History Narrative  . None   Additional Social History:    Pain Medications:  none Prescriptions: none Over the Counter: none History of alcohol / drug use?: No history of alcohol / drug abuse Negative Consequences of Use: Personal relationships, Financial, Work / School Withdrawal Symptoms: Other (Comment)      Current Medications: Current Facility-Administered Medications  Medication Dose Route Frequency Provider Last Rate Last Dose  . acetaminophen (TYLENOL) tablet 650 mg  650 mg Oral Q6H PRN Jimmy Footman, MD      . alum & mag hydroxide-simeth (MAALOX/MYLANTA) 200-200-20 MG/5ML suspension 30 mL  30 mL Oral Q4H PRN Jimmy Footman, MD      . ARIPiprazole (ABILIFY) tablet 30 mg  30 mg Oral Daily Jimmy Footman, MD   30 mg at 03/18/16 0802  . OLANZapine (ZYPREXA) injection 10 mg  10 mg Intramuscular Daily Jimmy Footman, MD       Or  . ARIPiprazole (ABILIFY) tablet 30 mg  30 mg Oral Daily Jimmy Footman, MD   30 mg at 03/18/16 0800  . feeding supplement (ENSURE ENLIVE) (ENSURE ENLIVE) liquid 237 mL  237 mL Oral TID BM Jimmy Footman, MD   237 mL at 03/18/16 0803  . haloperidol (HALDOL) tablet 5 mg  5 mg Oral BID Shari Prows, MD   5 mg at 03/18/16 0803  . magnesium hydroxide (MILK OF MAGNESIA) suspension 30 mL  30 mL Oral Daily PRN Jimmy Footman, MD      . multivitamin with minerals tablet 1 tablet  1 tablet Oral Daily Jimmy Footman, MD   1 tablet at 03/18/16 0803  . Oxcarbazepine (TRILEPTAL) tablet 300 mg  300 mg Oral BID Jimmy Footman, MD   300 mg at 03/18/16 4098    Lab Results:  No results found for this or any previous visit (from the past 48 hour(s)).  Blood Alcohol level:  Lab Results  Component Value Date   [facility] <5 02/29/2016   ETH <5 08/03/2015    Metabolic Disorder Labs: Lab Results  Component Value Date   HGBA1C 5.4 03/07/2016   MPG 108 03/07/2016   MPG 117 07/27/2015   Lab Results  Component Value Date   PROLACTIN 20.7 07/27/2015  Lab Results  Component Value Date   CHOL 165 03/07/2016   TRIG 67 03/07/2016   HDL 48 03/07/2016   CHOLHDL 3.4 03/07/2016   VLDL 13 03/07/2016   LDLCALC 104 (H) 03/07/2016   LDLCALC 95 07/27/2015    Physical Findings: AIMS:  , ,  ,  ,    CIWA:    COWS:  COWS Total Score: 1  Musculoskeletal: Strength & Muscle Tone: within normal limits Gait & Station: normal Patient leans: N/A  Psychiatric Specialty Exam: Physical Exam  Nursing note and vitals reviewed. Constitutional: She is oriented to person, place, and time. She appears well-developed and well-nourished.  HENT:  Head: Normocephalic and atraumatic.  Eyes: Conjunctivae and EOM are normal.  Neck: Normal range of motion.  Respiratory: Effort normal.  Musculoskeletal: Normal range of motion.  Neurological: She is alert and oriented to person, place, and time.    Review of Systems  Unable to perform ROS: Acuity of condition  Constitutional: Negative.   HENT: Negative.   Eyes: Negative.   Respiratory: Negative.   Cardiovascular: Negative.   Gastrointestinal: Negative.   Genitourinary: Negative.   Musculoskeletal: Negative.   Skin: Negative.   Neurological: Negative.   Endo/Heme/Allergies: Negative.   Psychiatric/Behavioral: Positive for hallucinations.  All other systems reviewed and are negative.   Blood pressure 119/80, pulse 86, temperature 98.2 F (36.8 C), temperature source Oral, resp. rate 18, height 5\' 4"  (1.626 m), weight 90.7 kg (200 lb), last menstrual period 02/27/2016, SpO2 98 %.Body mass index is 34.33 kg/m.  General Appearance: Disheveled  Eye Contact:  Minimal  Speech:  Normal Rate  Volume:  Normal  Mood:  Irrtiablte and defensive  Affect:  Blunt  Thought Process:  Disorganized and Descriptions of Associations: Loose  Orientation:  Full (Time, Place, and Person)  Thought Content:  Delusions  Suicidal Thoughts:  No  Homicidal Thoughts:  No  Memory:  Immediate;   Poor Recent;    Poor Remote;   Poor  Judgement:  Impaired  Insight:  Lacking  Psychomotor Activity:  Decreased  Concentration:  Concentration: Poor and Attention Span: Poor  Recall:  Poor  Fund of Knowledge:  Poor  Language:  Fair  Akathisia:  No  Handed:    AIMS (if indicated):     Assets:  Communication Skills Leisure Time  ADL's:  Intact  Cognition:  Impaired,  Mild  Sleep:  Number of Hours: 7.5     Treatment Plan Summary:  No improvement since admission. Basically nonverbal. Withdrawn to her room minimal oral intake. Only eating one meal a day and Ativan in she is only eating about 50%. She has been drinking ensures per nursing staff.  Patient is a 41 year old Caucasian female with history of schizoaffective bipolar type. He presented to the emergency department due to aggression, agitation, and psychosis. Patient has not been compliant with medications.  For schizoaffective disorder: The patient has not been interacting much with staff or peers and has been isolative to her room. Patient has been is started on on Abilify now at 30mg  po daily and Trileptal 300 mg twice a day.  Plan to titrate up Abilify and offer Abilify injectable. Abilify was chosen as patient requested to take it and the patient's mother reported patient had had a good response to Abilify in the past. The patient has been taking Abilify since Jan 5th. Not much improvement and has yet been sneaking when my need to consider the possibility of switching to a different antipsychotic. The other  possibility would be to consider Hinda Glatternvega (which has a long acting injectable) as the patient has history of being noncompliant.   Continue abilify to 30 mg. She refuses to take full dose of Abilify on 1/11. She was only willing to take 20 mg.Orders for Non emergency forced medications again were placed again. She was compliant with all medications on the morning of 1/20 and 1/21. If no improvement with Abilify in the next 1-2 days, consider  changing to standing Zyprexa.  For possible catatonia patient started on Ativan 2 mg tid.----Appears to come out of her room more often when after she takes Ativan.  For agitation: continue Ativan 2 mg every 4 hours as needed  Patient require Manual Hold on 1/4 as she refused to leave the office where she was being interviewed on 1/4.   Ordered multivitamins  Diet regular  Vital signs daily  Continue to push fluids. Ordered ensure tid  Precautions every 15 minute checks  Hospitalization status continue involuntary commitment------ [facility]  We will provide the mother with a letter recommending her to become legal guardian. Letter recommending guardian she was mailed to the patient's mother on 1/11.   Labs: TSH, hemoglobin A1c and lipid panel, b12, BMP--has refused all labs---finally cooperative with labs on 1/10.  All wnl  Collateral information has been  obtained from the patient's mother. Patient has declined for us to have any contact with the mother however at this point in time she does not have capacity to make that decision. She is very psychotic. The mother will be contacted again today. We plan to discuss with her the need of guardianship in this case. This patient has refused to apply for Medicaid or disability. She isn't paranoid and unable to work. Mother feels very unsafe with her around the house as patient is noncompliant with medication. Patient displayed aggression towards the family prior to admission. They do not plan for her would be to have potentially group home placement, and act team and a guardian and I will agree for a long acting injectable.   Spoke with the patient's mother again on 1/11. We we will write [place] supporting the need for the patient to have a guardian. The letter will be given mail to his mother. She plans to go to the court house next week and apply for guardianship.    Mother will call SW for updates on Wednesday 1/17 at 2 pm.  Mother plans to take letter to court house in [place] on Tuesday of next week.  Continue psychiatric medicine for bipolar disorder. Supportive review of plan with patient even though she was not really participating with it. No other change to medicine today.   03/12/2016 No medication changes  03/13/2016 No medication changes.  03/14/2016 Added Haldol 5 mg bid for psychosis.  03/15/2016 No medication changes.  03/16/2016 No medication changes.  03/17/2016: No medication changes but she did take all medications in the morning  03/18/2016: No medication changes but more interactive with this writer   Levora AngelKAPUR,AARTI KAMAL, MD 03/18/2016, 12:47 PM

## 2016-03-18 NOTE — Plan of Care (Signed)
Problem: Role Relationship: Goal: Ability to communicate needs accurately will improve Outcome: Not Progressing Pt has not been communicating needs accurately.

## 2016-03-18 NOTE — Progress Notes (Signed)
Patient currently awake.  Continues to display bizarre behavior, guarded and spontaneous. Avoiding to talk about her mental health, denial and minimizing. Was encouraged to talk to staff and express her feelings. Safety precautions reinforced.

## 2016-03-18 NOTE — [facility] Group Notes (Signed)
[facility] Group Notes:  (Nursing/MHT/Case Management/Adjunct)  Date:  03/18/2016  Time:  5:07 AM  Type of Therapy:  Psychoeducational Skills  Participation Level:  Did Not Attend   Summary of Progress/Problems:  Jessica MilroyLaquanda Y Vermon [name] 03/18/2016, 5:07 AM

## 2016-03-18 NOTE — Plan of Care (Signed)
Problem: Coping: Goal: Ability to verbalize feelings will improve Outcome: Not Progressing Patient not able to verbalize frustrations at this time [facility]

## 2016-03-18 NOTE — [facility] Group Notes (Signed)
[facility] LCSW Group Therapy  03/17/2016 12:24 PM  Type of Therapy:  Group Therapy  Participation Level:  Patient did not attend group. CSW invited patient to group.    Vannesa Abair M. Johnella MoloneyBandi, Alexander MtLCSW 03/18/2016

## 2016-03-19 MED ORDER — LORAZEPAM 2 MG PO TABS
2.0000 mg | ORAL_TABLET | Freq: Two times a day (BID) | ORAL | Status: DC
  Administered 2016-03-19 – 2016-03-21 (×4): 2 mg via ORAL
  Filled 2016-03-19 (×4): qty 1

## 2016-03-19 MED ORDER — OLANZAPINE 5 MG PO TBDP
10.0000 mg | ORAL_TABLET | Freq: Two times a day (BID) | ORAL | Status: DC
  Administered 2016-03-19 – 2016-03-21 (×4): 10 mg via ORAL
  Filled 2016-03-19 (×4): qty 2

## 2016-03-19 NOTE — Progress Notes (Signed)
Recreation Therapy Notes  Date: 01.22.18 Time: 9:30 am Location: Craft Room  Group Topic: Self-expression  Goal Area(s) Addresses:  Patient will identify one color per emotion listed on wheel. Patient will verbalize benefit of using art as a means of self-expression. Patient will verbalize one emotion experienced during session. Patient will be educated on other forms of self-expression.  Behavioral Response: Did not attend  Intervention: Emotion Wheel  Activity: Patients were given an Emotion Wheel worksheet and were instructed to pick a color for each emotion listed on the wheel.  Education: LRT educated patients on other forms of self-expression.  Education Outcome: Patient did not attend group.   Clinical Observations/Feedback: Patient did not attend group.  Jacquelynn CreeGreene,Kenya Shiraishi M, LRT/CTRS 03/19/2016 10:04 AM

## 2016-03-19 NOTE — Progress Notes (Signed)
Presents to medication room during morning med pass and states "My depression and anxiety are worse today"  When asked why patient states "Because of these clothes.  The color is depressing"   Offered to assist patient with washing her clothes, patient stated "No thank you" and returned to her room.  Keeps eyes downcast.  Presented during evening medication pass and states "someone gave me some opium and I need something now"  When questioned about opium patient refused to answer.  Isolates to her room except for medication pass.   Not eating and refusing ensure.

## 2016-03-19 NOTE — [facility] Group Notes (Signed)
[facility] Group Notes:  (Nursing/MHT/Case Management/Adjunct)  Date:  03/19/2016  Time:  9:41 PM  Type of Therapy:  Psychoeducational Skills  Participation Level:  Did Not Attend  Participation Quality:   Summary of Progress/Problems:  Mayra NeerJackie L Edit Ricciardelli 03/19/2016, 9:41 PM

## 2016-03-19 NOTE — Progress Notes (Signed)
[facility] MD Progress Note  03/19/2016 1:28 PM Jessica [name]  MRN:  [id number] Subjective:  Patient is a 41 year old Caucasian female with history of schizoaffective disorder bipolar type. She was brought in by police to [facility] emergency department on January 3rd. Per the emergency departments note patient's mother called EMS after the patient grabbed her arm and wouldn't let go. Upon EMS arrival the patient was standing aggressively in the doorway and saying "[place], [place]. There is too much evil in [place]". She was uncooperative with evaluation yelling occasionally. Nurses in the ER described her as agitated and refusing to dressed out in his scrubs tangential.  It has been reported patient was noncompliant with her medications.  Per chart review she was hospitalized twice last year in behavioral health [place]. Her last admission was in June 2017. She was discharged with a diagnosis of schizoaffective disorder bipolar type. During her stay in the hospital she was placed on known emergency forced medications.   03/14/2016. [name] remains psychotic, disorganized, hyperreligous, isolative. She now accepts medications. She comes to medication room for meds and to dining area for food only. Otherwise, she is in bed with her eyes closed. Poor hygiene. No somatic complaitns. Complains of poor sleep last night. No group participation.   03/15/2016. [name] recently responded to my questions. She said that she was doing better. But then closed her eyes and did not answer any further questions. She is aware of her surroundings. The first time I went to her room this morning she was in the bathroom and was able to talk to me through the curtain. She did not voice any somatic complaints. There were no complaints of side effects. I started Haldol last night to address psychotic symptoms. She stays secluded to her room and to her bed with her eyes closed. She does not interact with peers or staff but  comes out for medications and meals.   03/16/2016 [name] again refused to participate in treatment team meeting. She refused to talk to the nurse. She was the most, so far, interactive with me. With her eyes closed and without any motion, she reported that she took a shower, went for a walk, ate breakfast and , yes, has been taking medications. She slept better. When asked if the medications are right, she said "no". When asked which medications she would prefer, she named Trileptal and Abilify. She indeed is on Trileptal and Abilify. I added Haldol just recently.  03/17/2016 The patient was initially reluctant to talk to this writer and is due from [facility] [facility] but then eventually did speak to this Clinical research associate. She did not appear like she was responding to internal stimuli but stated that she was hearing voices that were mild in nature. She refused to elaborate give any details on the content of the voices. She denied ever telling her to hurt herself or anyone else. She denies any current active or passive suicidal thoughts but mood has remained depressed. She says she has been repeatedly praying and exercising. He said she did get off any breakfast brush her teeth this morning. She was compliant with all psychotropic medications except for a multivitamin. She has not been going to groups and has been fairly isolative to her room. She denies any new somatic complaints. She denies any physical adverse side effects associate with medication. No signs of EPS. She has not been interacting with staff or peers.  03/18/2016: The patient initially spoke more to this Clinical research associate today  than yesterday. She however then became mildly irritable and was refusing to answer some questions. She stated that she was feeling better overall but has continued to be isolative to her room and does not interact with staff and peers. She has been compliant with the Abilify and Trileptal but insight and judgment remain poor. The patient  denied that she wanted to hurt herself or anyone else. She denied any current active or passive suicidal thoughts. She did admit to having some auditory hallucinations but could not verbalize with the voices were saying and reported that it was "private" and that she would only talk about voices with her outpatient psychiatrist. She denied any visual hallucinations. She denied auditory hallucinations were command in nature. She denies any physical adverse side effects associated with the medication. Per nursing, she slept fairly well last night. The patient however sleeping on and off throughout the daytime. She denies any problems with her appetite and does eat her meals. No signs of EPS. Vital signs are stable.  1/22 Patient irritable, refuses to speak with me. Claims that she has lice and is asking for clippers to shave her head. Per nursing staff there is no evidence of days. Vision has been uncooperative. Despite taking Abilify patient has not improved. She has been during the hospital for 18 days with minimal improvement.  During the early part of her hospitalization she had responded well to olanzapine. She requested to have this medication change because she felt Abilify at work even better for her in the past. This was confirmed by her mother. Here she has been receiving Abilify 30 mg for several weeks with no response. At this point Abilify needs to be discontinued. I will retry her on olanzapine.   Per nursing:  D: Patient is alert and oriented on the unit this shift. Patient not attended and actively participated in groups today. Patient denies suicidal ideation, homicidal ideation, auditory or visual hallucinations at the present time.  A: Scheduled medications are administered to patient as per MD orders. Emotional support and encouragement are provided. Patient is maintained on q.15 minute safety checks. Patient is informed to notify staff with questions or concerns. R: No adverse  medication reactions are noted. Patient is not cooperative with medication administration and treatment plan today. Patient is non receptive, calm and  Non cooperative on the unit at this time. Patient does not interact  with others on the unit this shift. Patient contracts for safety at this time. Patient remains safe at this time. Depression  And anxiety level not able to obtain patient does not answer   Principal Problem: Schizoaffective disorder, bipolar type [facility]) Diagnosis:   Patient Active Problem List   Diagnosis Date Noted  . Schizoaffective disorder, bipolar type (HCC) [F25.0] 07/25/2015   Total Time spent with patient: 20 minutes  Past Psychiatric History: Patient has been hospitalized several times at behavioral health in [place]. She was just discharged in July 2017.  I don't have any information regarding prior suicidal attempts or self-injurious behaviors   Past Medical History:  Past Medical History:  Diagnosis Date  . Anxiety   . Bipolar 1 disorder (HCC)   . Depression   . Schizophrenia (HCC)    History reviewed. No pertinent surgical history.  Family History:  Family History  Problem Relation Age of Onset  . Hypertension Mother   . Mental illness Father    Family Psychiatric  History: Unknown at this point as patient is uncooperative   Social History: Not  much information is available at this time other than she lives with her mother in [place]. 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 Marital status: Single    Spouse name: N/A  . Number of children: N/A  . Years of education: N/A   Social History Main Topics  . Smoking status: Never Smoker  . Smokeless tobacco: Never Used  . Alcohol use No  . Drug use: No  . Sexual activity: No   Other Topics Concern  . None   Social History Narrative  . None    Additional Social History:    Pain Medications: none Prescriptions: none Over the Counter: none History of alcohol / drug use?: No history of alcohol / drug abuse Negative Consequences of Use: Personal relationships, Financial, Work / School Withdrawal Symptoms: Other (Comment)      Current Medications: Current Facility-Administered Medications  Medication Dose Route Frequency Provider Last Rate Last Dose  . acetaminophen (TYLENOL) tablet 650 mg  650 mg Oral Q6H PRN Jimmy Footman, MD      . alum & mag hydroxide-simeth (MAALOX/MYLANTA) 200-200-20 MG/5ML suspension 30 mL  30 mL Oral Q4H PRN Jimmy Footman, MD      . feeding supplement (ENSURE ENLIVE) (ENSURE ENLIVE) liquid 237 mL  237 mL Oral TID BM Jimmy Footman, MD   237 mL at 03/18/16 0803  . magnesium hydroxide (MILK OF MAGNESIA) suspension 30 mL  30 mL Oral Daily PRN Jimmy Footman, MD      . multivitamin with minerals tablet 1 tablet  1 tablet Oral Daily Jimmy Footman, MD   1 tablet at 03/19/16 [phone]  . OLANZapine zydis (ZYPREXA) disintegrating tablet 10 mg  10 mg Oral BID Jimmy Footman, MD      . Oxcarbazepine (TRILEPTAL) tablet 300 mg  300 mg Oral BID Jimmy Footman, MD   300 mg at 03/19/16 9604    Lab Results:  No results found for this or any previous visit (from the past 48 hour(s)).  Blood Alcohol level:  Lab Results  Component Value Date   ETH <5 02/29/2016   ETH <5 08/03/2015    Metabolic Disorder Labs: Lab Results  Component Value Date   HGBA1C 5.4 03/07/2016   MPG 108 03/07/2016   MPG 117 07/27/2015   Lab Results  Component Value Date   PROLACTIN 20.7 07/27/2015   Lab Results  Component Value Date   CHOL 165 03/07/2016   TRIG 67 03/07/2016   HDL 48 03/07/2016   CHOLHDL 3.4 03/07/2016   VLDL 13 03/07/2016   LDLCALC 104 (H) 03/07/2016   LDLCALC 95 07/27/2015    Physical Findings: AIMS:  , ,  ,  ,    CIWA:    COWS:   COWS Total Score: 1  Musculoskeletal: Strength & Muscle Tone: within normal limits Gait & Station: normal Patient leans: N/A  Psychiatric Specialty Exam: Physical Exam  Nursing note and vitals reviewed. Constitutional: She is oriented to person, place, and time. She appears well-developed and well-nourished.  HENT:  Head: Normocephalic and atraumatic.  Eyes: Conjunctivae and EOM are normal.  Neck: Normal range of motion.  Respiratory: Effort normal.  Musculoskeletal: Normal range of motion.  Neurological: She is alert and oriented to person, place, and time.    Review of Systems  Unable to perform ROS: Acuity of  condition  Constitutional: Negative.   HENT: Negative.   Eyes: Negative.   Respiratory: Negative.   Cardiovascular: Negative.   Gastrointestinal: Negative.   Genitourinary: Negative.   Musculoskeletal: Negative.   Skin: Negative.   Neurological: Negative.   Endo/Heme/Allergies: Negative.   Psychiatric/Behavioral: Positive for hallucinations.  All other systems reviewed and are negative.   Blood pressure 123/75, pulse 79, temperature 98.2 F (36.8 C), resp. rate 18, height 5\' 4"  (1.626 m), weight 90.7 kg (200 lb), last menstrual period 02/27/2016, SpO2 98 %.Body mass index is 34.33 kg/m.  General Appearance: Disheveled  Eye Contact:  Minimal  Speech:  Normal Rate  Volume:  Normal  Mood:  Irrtiablte and defensive  Affect:  Blunt  Thought Process:  Disorganized and Descriptions of Associations: Loose  Orientation:  Full (Time, Place, and Person)  Thought Content:  Delusions  Suicidal Thoughts:  No  Homicidal Thoughts:  No  Memory:  Immediate;   Poor Recent;   Poor Remote;   Poor  Judgement:  Impaired  Insight:  Lacking  Psychomotor Activity:  Decreased  Concentration:  Concentration: Poor and Attention Span: Poor  Recall:  Poor  Fund of Knowledge:  Poor  Language:  Fair  Akathisia:  No  Handed:    AIMS (if indicated):     Assets:  Communication  Skills Leisure Time  ADL's:  Intact  Cognition:  Impaired,  Mild  Sleep:  Number of Hours: 6     Treatment Plan Summary:  No improvement since admission. Basically nonverbal. Withdrawn to her room minimal oral intake. Only eating one meal a day and Ativan in she is only eating about 50%. She has been drinking ensures per nursing staff.  Patient is a 41 year old Caucasian female with history of schizoaffective bipolar type. He presented to the emergency department due to aggression, agitation, and psychosis. Patient has not been compliant with medications.  For schizoaffective disorder: Lack of response to Abilify monotherapy. Lack of response to Abilify in combination with haloperidol 5 mg twice a day. This point in time haloperidol and Abilify will be discontinued. She will be retried on olanzapine 10 mg twice a day.  For possible catatonia: Patient appears to have some catatonia during the early part of her hospitalization. She lately has not been receiving Ativan. She was irritable and agitated this morning. We'll restart Ativan 2 mg twice a day.  For agitation: continue Ativan 2 mg every 4 hours as needed  Patient require Manual Hold on 1/4 as she refused to leave the office where she was being interviewed on 1/4.   Ordered multivitamins  Diet regular  Vital signs daily  Continue to push fluids. Ordered ensure tid. Poor oral intake. Patient has not been eating in more than 24 hours. Vital signs are stable. She has been refusing meals and ensures.  Drank last ensure yesterday am.  Precautions every 15 minute checks  Hospitalization status continue involuntary commitment------ [facility]  We will provide the mother with a letter recommending her to become legal guardian. Letter recommending guardian she was mailed to the patient's mother on 1/11.   Labs: TSH, hemoglobin A1c and lipid panel, b12, BMP--has refused all labs---finally  cooperative with labs on 1/10.  All wnl  Collateral information has been  obtained from the patient's mother. Patient has declined for us to have any contact with the mother however at this point in time she does not have capacity to make that decision. She is very  psychotic. Mother has been provided for a letter supporting the need for patient to have a guardian. Will contact mother to see how the process of guardianship is going.    Jimmy Footman, MD 03/19/2016, 1:28 PM

## 2016-03-20 NOTE — Progress Notes (Signed)
Patient ID: Jessica [name], female   DOB: 06/18/1975, 41 y.o.   MRN: [id number] Isolated to room, unenthusiastic, poorly kept appearance, dismissive, responded unwillingly to questions with 'Yes/No sir", polite; nursing staff will continue to encourage ADL, monitor fluid/calorie intake and report noncompliances to physician timely.

## 2016-03-20 NOTE — Plan of Care (Signed)
Problem: Activity: Goal: Sleeping patterns will improve Outcome: Progressing Patient slept for Estimated Hours of 8.15; every 15 minutes safety round maintained, no injury or falls during this shift.    

## 2016-03-20 NOTE — [facility] Group Notes (Signed)
[facility] LCSW Group Therapy Note  Date/Time: 03/20/2016, 3pm  Type of Therapy/Topic:  Group Therapy:  Feelings about Diagnosis  Participation Level: Active  Mood: reported Okay mood   Description of Group:    This group will allow patients to explore their thoughts and feelings about diagnoses they have received. Patients will be guided to explore their level of understanding and acceptance of these diagnoses. Facilitator will encourage patients to process their thoughts and feelings about the reactions of others to their diagnosis, and will guide patients in identifying ways to discuss their diagnosis with significant others in their lives. This group will be process-oriented, with patients participating in exploration of their own experiences as well as giving and receiving support and challenge from other group members.   Therapeutic Goals: 1. Patient will demonstrate understanding of diagnosis as evidence by identifying two or more symptoms of the disorder:  2. Patient will be able to express two feelings regarding the diagnosis 3. Patient will demonstrate ability to communicate their needs through discussion and/or role plays  Summary of Patient Progress:  Pt able to achieve the above therapeutic goals.  Pt has some insight into the way her illness has affected her, but still some denial about how this is affecting her day to day decision making.  Therapeutic Modalities:   Cognitive Behavioral Therapy Brief Therapy Feelings Identification '  Jake SharkSara Heywood Tokunaga, MSW, LCSW

## 2016-03-20 NOTE — [facility] Group Notes (Signed)
[facility] Group Notes:  (Nursing/MHT/Case Management/Adjunct)  Date:  03/20/2016  Time:  2:10 PM  Type of Therapy:  Psychoeducational Skills  Participation Level:  Did Not Attend   s:  Jessica [name] Ellison 03/20/2016, 2:10 PM

## 2016-03-20 NOTE — Progress Notes (Signed)
[facility] MD Progress Note  03/20/2016 12:57 PM HAZELEE HARBOLD  MRN:  [id number] Subjective:  Patient is a 41 year old Caucasian female with history of schizoaffective disorder bipolar type. She was brought in by police to [facility] emergency department on January 3rd. Per the emergency departments note patient's mother called EMS after the patient grabbed her arm and wouldn't let go. Upon EMS arrival the patient was standing aggressively in the doorway and saying "[place], [place]. There is too much evil in [place]". She was uncooperative with evaluation yelling occasionally. Nurses in the ER described her as agitated and refusing to dressed out in his scrubs tangential.  It has been reported patient was noncompliant with her medications.  Per chart review she was hospitalized twice last year in behavioral health [place]. Her last admission was in June 2017. She was discharged with a diagnosis of schizoaffective disorder bipolar type. During her stay in the hospital she was placed on known emergency forced medications.   03/14/2016. Ms. Rocque remains psychotic, disorganized, hyperreligous, isolative. She now accepts medications. She comes to medication room for meds and to dining area for food only. Otherwise, she is in bed with her eyes closed. Poor hygiene. No somatic complaitns. Complains of poor sleep last night. No group participation.   03/15/2016. Ms. Schuhmacher recently responded to my questions. She said that she was doing better. But then closed her eyes and did not answer any further questions. She is aware of her surroundings. The first time I went to her room this morning she was in the bathroom and was able to talk to me through the curtain. She did not voice any somatic complaints. There were no complaints of side effects. I started Haldol last night to address psychotic symptoms. She stays secluded to her room and to her bed with her eyes closed. She does not interact with peers or staff but  comes out for medications and meals.   03/16/2016 Ms. Delay again refused to participate in treatment team meeting. She refused to talk to the nurse. She was the most, so far, interactive with me. With her eyes closed and without any motion, she reported that she took a shower, went for a walk, ate breakfast and , yes, has been taking medications. She slept better. When asked if the medications are right, she said "no". When asked which medications she would prefer, she named Trileptal and Abilify. She indeed is on Trileptal and Abilify. I added Haldol just recently.  03/17/2016 The patient was initially reluctant to talk to this writer and is due from [place] [facility] program but then eventually did speak to this Clinical research associate. She did not appear like she was responding to internal stimuli but stated that she was hearing voices that were mild in nature. She refused to elaborate give any details on the content of the voices. She denied ever telling her to hurt herself or anyone else. She denies any current active or passive suicidal thoughts but mood has remained depressed. She says she has been repeatedly praying and exercising. He said she did get off any breakfast brush her teeth this morning. She was compliant with all psychotropic medications except for a multivitamin. She has not been going to groups and has been fairly isolative to her room. She denies any new somatic complaints. She denies any physical adverse side effects associate with medication. No signs of EPS. She has not been interacting with staff or peers.  03/18/2016: The patient initially spoke more to this Clinical research associate today  than yesterday. She however then became mildly irritable and was refusing to answer some questions. She stated that she was feeling better overall but has continued to be isolative to her room and does not interact with staff and peers. She has been compliant with the Abilify and Trileptal but insight and judgment remain poor. The patient  denied that she wanted to hurt herself or anyone else. She denied any current active or passive suicidal thoughts. She did admit to having some auditory hallucinations but could not verbalize with the voices were saying and reported that it was "private" and that she would only talk about voices with her outpatient psychiatrist. She denied any visual hallucinations. She denied auditory hallucinations were command in nature. She denies any physical adverse side effects associated with the medication. Per nursing, she slept fairly well last night. The patient however sleeping on and off throughout the daytime. She denies any problems with her appetite and does eat her meals. No signs of EPS. Vital signs are stable.  1/22 Patient irritable, refuses to speak with me. Claims that she has lice and is asking for clippers to shave her head. Per nursing staff there is no evidence of days. Vision has been uncooperative. Despite taking Abilify patient has not improved. She has been during the hospital for 18 days with minimal improvement.  During the early part of her hospitalization she had responded well to olanzapine. She requested to have this medication change because she felt Abilify at work even better for her in the past. This was confirmed by her mother. Here she has been receiving Abilify 30 mg for several weeks with no response. At this point Abilify needs to be discontinued. I will retry her on olanzapine.   1/23 patient was less irritable and somewhat cooperative with assessment today. She continues to be delusional and paranoid. She sat and spoke with me. Patient says that her mother has been hospitalized and she would like to be discharged in order to help her mother. She also reports today she had visitors from her undergrad (there has been no visitation time today, nobody has come to visit the patient today). She reported having some concern about patient's having intercourse, she complained about the  "pimping going on here at the hospital" and having to perform sexual favors in return for things.   He we are doing something illegal here at the hospital giving her medications that are illegal.  She continues to report having auditory and visual hallucinations but mainly occur at nighttime.  She hears multiple voices of people talking. As far as side effects she complains of feeling that medication is causing blurry vision and makes her feel "out of it".   Patient reports feeling better. She denies problems with her mood and denies suicidality. She is actually requesting to be discharged soon.  Ate 50% breakfast and 100% of lunch.  Took meds yesterday and today. Not attending any programming.    Per nursing: Patient's affect is sad, often looking down when speaking. Pt encouraged to go outside, but chooses to isolate to room. Pt reports that she ate lunch and is drinking fluids.  Pt supported emotionally and encouraged to express concerns and ask questions.  Pt remains safe with 15 minute checks.   Isolated to room, unenthusiastic, poorly kept appearance, dismissive, responded unwillingly to questions with 'Yes/No sir", polite; nursing staff will continue to encourage ADL, monitor fluid/calorie intake and report noncompliances to physician timely.  Principal Problem: Schizoaffective disorder, bipolar type (  HCC) Diagnosis:   Patient Active Problem List   Diagnosis Date Noted  . Schizoaffective disorder, bipolar type (HCC) [F25.0] 07/25/2015   Total Time spent with patient: 20 minutes  Past Psychiatric History: Patient has been hospitalized several times at behavioral health in [place]. She was just discharged in July 2017.  I don't have any information regarding prior suicidal attempts or self-injurious behaviors   Past Medical History:  Past Medical History:  Diagnosis Date  . Anxiety   . Bipolar 1 disorder (HCC)   . Depression   . Schizophrenia (HCC)    History reviewed.  No pertinent surgical history.  Family History:  Family History  Problem Relation Age of Onset  . Hypertension Mother   . Mental illness Father    Family Psychiatric  History: Unknown at this point as patient is uncooperative   Social History: Not much information is available at this time other than she lives with her mother in [place]. 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 Marital status: Single    Spouse name: N/A  . Number of children: N/A  . Years of education: N/A   Social History Main Topics  . Smoking status: Never Smoker  . Smokeless tobacco: Never Used  . Alcohol use No  . Drug use: No  . Sexual activity: No   Other Topics Concern  . None   Social History Narrative  . None   Additional Social History:    Pain Medications: none Prescriptions: none Over the Counter: none History of alcohol / drug use?: No history of alcohol / drug abuse Negative Consequences of Use: Personal relationships, Financial, Work / School Withdrawal Symptoms: Other (Comment)      Current Medications: Current Facility-Administered Medications  Medication Dose Route Frequency Provider Last Rate Last Dose  . acetaminophen (TYLENOL) tablet 650 mg  650 mg Oral Q6H PRN Jimmy Footman, MD      . alum & mag hydroxide-simeth (MAALOX/MYLANTA) 200-200-20 MG/5ML suspension 30 mL  30 mL Oral Q4H PRN Jimmy Footman, MD      . feeding supplement (ENSURE ENLIVE) (ENSURE ENLIVE) liquid 237 mL  237 mL Oral TID BM Jimmy Footman, MD   237 mL at 03/18/16 0803  . LORazepam (ATIVAN) tablet 2 mg  2 mg Oral BID Jimmy Footman, MD   2 mg at 03/20/16 2956  . magnesium hydroxide (MILK OF MAGNESIA) suspension 30 mL  30 mL Oral Daily PRN Jimmy Footman, MD      . multivitamin with minerals tablet 1 tablet  1 tablet Oral  Daily Jimmy Footman, MD   1 tablet at 03/20/16 0818  . OLANZapine zydis (ZYPREXA) disintegrating tablet 10 mg  10 mg Oral BID Jimmy Footman, MD   10 mg at 03/20/16 0815  . Oxcarbazepine (TRILEPTAL) tablet 300 mg  300 mg Oral BID Jimmy Footman, MD   300 mg at 03/20/16 2130    Lab Results:  No results found for this or any previous visit (from the past 48 hour(s)).  Blood Alcohol level:  Lab Results  Component Value Date   [facility] <5 02/29/2016   ETH <5 08/03/2015    Metabolic Disorder Labs: Lab Results  Component Value Date   HGBA1C 5.4 03/07/2016   MPG 108 03/07/2016   MPG 117 07/27/2015   Lab Results  Component Value  Date   PROLACTIN 20.7 07/27/2015   Lab Results  Component Value Date   CHOL 165 03/07/2016   TRIG 67 03/07/2016   HDL 48 03/07/2016   CHOLHDL 3.4 03/07/2016   VLDL 13 03/07/2016   LDLCALC 104 (H) 03/07/2016   LDLCALC 95 07/27/2015    Physical Findings: AIMS:  , ,  ,  ,    CIWA:    COWS:  COWS Total Score: 1  Musculoskeletal: Strength & Muscle Tone: within normal limits Gait & Station: normal Patient leans: N/A  Psychiatric Specialty Exam: Physical Exam  Nursing note and vitals reviewed. Constitutional: She is oriented to person, place, and time. She appears well-nourished.  HENT:  Head: Normocephalic and atraumatic.  Eyes: Conjunctivae and EOM are normal.  Neck: Normal range of motion.  Respiratory: Effort normal.  Musculoskeletal: Normal range of motion.  Neurological: She is alert and oriented to person, place, and time.    Review of Systems  Constitutional: Negative.   HENT: Negative.   Eyes: Negative.   Respiratory: Negative.   Cardiovascular: Negative.   Gastrointestinal: Negative.   Genitourinary: Negative.   Musculoskeletal: Negative.   Skin: Negative.   Neurological: Negative.   Endo/Heme/Allergies: Negative.   Psychiatric/Behavioral: Positive for hallucinations. The patient has insomnia.    All other systems reviewed and are negative.   Blood pressure 130/90, pulse (!) 109, temperature 98.3 F (36.8 C), temperature source Oral, resp. rate 18, height 5\' 4"  (1.626 m), weight 90.7 kg (200 lb), last menstrual period 02/27/2016, SpO2 98 %.Body mass index is 34.33 kg/m.  General Appearance: Disheveled  Eye Contact:  Minimal  Speech:  Normal Rate  Volume:  Normal  Mood:  less Irrtiablte   Affect:  Constricted  Thought Process:  Disorganized and Descriptions of Associations: Loose  Orientation:  Full (Time, Place, and Person)  Thought Content:  Delusions and hallucinations  Suicidal Thoughts:  No  Homicidal Thoughts:  No  Memory:  Immediate;   Poor Recent;   Poor Remote;   Poor  Judgement:  Impaired  Insight:  Lacking  Psychomotor Activity:  Decreased  Concentration:  Concentration: Poor and Attention Span: Poor  Recall:  Poor  Fund of Knowledge:  Poor  Language:  Fair  Akathisia:  No  Handed:    AIMS (if indicated):     Assets:  Communication Skills Leisure Time  ADL's:  Intact  Cognition:  Impaired,  Mild  Sleep:  Number of Hours: 8.15     Treatment Plan Summary:  No improvement since admission. Basically nonverbal. Withdrawn to her room minimal oral intake. Only eating one meal a day and Ativan in she is only eating about 50%. She has been drinking ensures per nursing staff.  Patient is a 41 year old Caucasian female with history of schizoaffective bipolar type. He presented to the emergency department due to aggression, agitation, and psychosis. Patient has not been compliant with medications.  For schizoaffective disorder: Lack of response to Abilify monotherapy. Lack of response to Abilify in combination with haloperidol 5 mg twice a day. This point in time haloperidol and Abilify will be discontinued. She will be retried on olanzapine.  Continue olanzapine zydis 10 mg twice a day   For possible catatonia: Patient appears to have some catatonia during the  early part of her hospitalization. She lately has not been receiving Ativan. Continue Ativan 2 mg twice a day which appears to be helping the patient. She has been more talkative and has been leaving her room more today.  For agitation: continue Ativan 2 mg every 4 hours as needed  Patient require Manual Hold on 1/4 as she refused to leave the office where she was being interviewed on 1/4.   Ordered multivitamins  Diet regular  Vital signs daily  Continue to push fluids. Ordered ensure tid. Fair oral intake.   Precautions every 15 minute checks  Hospitalization status continue involuntary commitment------ [facility].  Letter recommending guardian she was mailed to the patient's mother on 1/11.   Labs: TSH, hemoglobin A1c and lipid panel, b12, BMP--has refused all labs---finally cooperative with labs on 1/10.  All wnl  Collateral information has been  obtained from the patient's mother. Patient has declined for [place] to have any contact with the mother however at this point in time she does not have capacity to make that decision. She is very psychotic. Mother has been provided for a letter supporting the need for patient to have a guardian. Will contact mother to see how the process of guardianship is going.   Will order a basic metabolic panel as patient has not had any labs since January 10.  I will try to contact the patient's mother to see if she is in the hospital.  Jimmy Footman, MD 03/20/2016, 12:57 PM

## 2016-03-20 NOTE — Progress Notes (Signed)
Patient's affect is sad, often looking down when speaking. Pt encouraged to go outside, but chooses to isolate to room. Pt reports that she ate lunch and is drinking fluids.  Pt supported emotionally and encouraged to express concerns and ask questions.  Pt remains safe with 15 minute checks.

## 2016-03-20 NOTE — Progress Notes (Signed)
Recreation Therapy Notes  Date: 01.23.18 Time: 9:30 am Location: Craft Room  Group Topic: Goal Setting  Goal Area(s) Addresses:  Patient will write at least one goal. Patient will write at least one obstacle.  Behavioral Response: Did not attend  Intervention: Recovery Goal Chart  Activity: Patients were instructed to make a Recovery Goal Chart including goals, obstacles, the date they started working on their goals, and the date they achieved their goals.  Education: LRT educated patients on healthy ways to celebrate reaching their goals.  Education Outcome: Patient did not attend group.  Clinical Observations/Feedback: Patient did not attend group.  Jacquelynn CreeGreene,Kinzie Wickes M, LRT/CTRS 03/20/2016 10:18 AM

## 2016-03-20 NOTE — [facility] Group Notes (Signed)
[facility] Group Notes:  (Nursing/MHT/Case Management/Adjunct) *Late Entry for 03/19/16   Date:  03/20/2016  Time:  9:40 AM  Type of Therapy:  Psychoeducational Skills  Participation Level:  Did Not Attend  Lynelle SmokeCara Travis [facility] Center[facility] 03/20/2016, 9:40 AM

## 2016-03-21 DIAGNOSIS — K589 Irritable bowel syndrome without diarrhea: Secondary | ICD-10-CM

## 2016-03-21 LAB — BASIC METABOLIC PANEL
Anion gap: 10 (ref 5–15)
BUN: 14 mg/dL (ref 6–20)
CALCIUM: 8.6 mg/dL — AB (ref 8.9–10.3)
CO2: 26 mmol/L (ref 22–32)
Chloride: 105 mmol/L (ref 101–111)
Creatinine, Ser: 0.67 mg/dL (ref 0.44–1.00)
GFR calc Af Amer: >60 mL/min (ref >60)
GLUCOSE: 99 mg/dL (ref 65–99)
POTASSIUM: 3.5 mmol/L (ref 3.5–5.1)
Sodium: 141 mmol/L (ref 135–145)

## 2016-03-21 MED ORDER — OLANZAPINE 10 MG PO TABS
20.0000 mg | ORAL_TABLET | Freq: Every day | ORAL | Status: DC
  Administered 2016-03-22: 20 mg via ORAL
  Filled 2016-03-21: qty 2

## 2016-03-21 MED ORDER — LORAZEPAM 2 MG PO TABS
2.0000 mg | ORAL_TABLET | Freq: Every day | ORAL | Status: DC
  Administered 2016-03-22 – 2016-03-28 (×7): 2 mg via ORAL
  Filled 2016-03-21 (×7): qty 1

## 2016-03-21 MED ORDER — OLANZAPINE 5 MG PO TBDP
10.0000 mg | ORAL_TABLET | Freq: Two times a day (BID) | ORAL | Status: AC
  Administered 2016-03-21: 10 mg via ORAL
  Filled 2016-03-21: qty 2

## 2016-03-21 MED ORDER — LORAZEPAM 1 MG PO TABS
1.0000 mg | ORAL_TABLET | Freq: Every day | ORAL | Status: DC
  Administered 2016-03-22 – 2016-03-28 (×7): 1 mg via ORAL
  Filled 2016-03-21 (×7): qty 1

## 2016-03-21 MED ORDER — OLANZAPINE 5 MG PO TBDP: 20.0000 mg | ORAL_TABLET | Freq: Every day | ORAL | Status: DC

## 2016-03-21 NOTE — Progress Notes (Signed)
Patient ID: Jessica [name], female   DOB: 01/01/1976, 41 y.o.   MRN: [id number] Observed sitting up in bed at the start of shift, personal clothes in her laps all covered with her jacket; she is more alert today although she remains isolated to room, pleasant on approach, still have ongoing thought blocking; fluids encouraged.

## 2016-03-21 NOTE — Tx Team (Signed)
Interdisciplinary Treatment and Diagnostic Plan Update  03/21/2016 Time of Session: 10:30am NALAYSIA MANGANIELLO MRN: [id number]  Principal Diagnosis: Schizoaffective disorder, bipolar type [facility])  Secondary Diagnoses: Principal Problem:   Schizoaffective disorder, bipolar type (HCC) Active Problems:   IBS (irritable bowel syndrome)   Current Medications:  Current Facility-Administered Medications  Medication Dose Route Frequency Provider Last Rate Last Dose  . acetaminophen (TYLENOL) tablet 650 mg  650 mg Oral Q6H PRN Jimmy Footman, MD      . alum & mag hydroxide-simeth (MAALOX/MYLANTA) 200-200-20 MG/5ML suspension 30 mL  30 mL Oral Q4H PRN Jimmy Footman, MD      . feeding supplement (ENSURE ENLIVE) (ENSURE ENLIVE) liquid 237 mL  237 mL Oral TID BM Jimmy Footman, MD   237 mL at 03/21/16 1000  . LORazepam (ATIVAN) tablet 2 mg  2 mg Oral BID Jimmy Footman, MD   2 mg at 03/21/16 0801  . magnesium hydroxide (MILK OF MAGNESIA) suspension 30 mL  30 mL Oral Daily PRN Jimmy Footman, MD      . multivitamin with minerals tablet 1 tablet  1 tablet Oral Daily Jimmy Footman, MD   1 tablet at 03/21/16 0801  . OLANZapine zydis (ZYPREXA) disintegrating tablet 10 mg  10 mg Oral BID Jimmy Footman, MD   10 mg at 03/21/16 0801  . Oxcarbazepine (TRILEPTAL) tablet 300 mg  300 mg Oral BID Jimmy Footman, MD   300 mg at 03/21/16 0801   PTA Medications: Prescriptions Prior to Admission  Medication Sig Dispense Refill Last Dose  . ARIPiprazole (ABILIFY) 5 MG tablet Take 5 mg by mouth daily.   02/29/2016 at Unknown time  . doxepin (SINEQUAN) 10 MG capsule Take 2 capsules (20 mg total) by mouth at bedtime as needed (sleep). 30 capsule 0 unknown  . hydrOXYzine (ATARAX/VISTARIL) 25 MG tablet Take 1 tablet (25 mg total) by mouth every 6 (six) hours as needed for anxiety. 30 tablet 0 unknown  . OLANZapine (ZYPREXA) 2.5 MG tablet  Take 5 tablets (12.5 mg total) by mouth at bedtime. (Patient not taking: Reported on 02/29/2016) 30 tablet 0 Not Taking at Unknown time  . OXcarbazepine (TRILEPTAL) 150 MG tablet Take 1 tablet (150 mg total) by mouth 2 (two) times daily. 60 tablet 0 02/29/2016 at Unknown time  . ziprasidone (GEODON) 20 MG capsule Take 1 capsule (20 mg total) by mouth 2 (two) times daily as needed (SEVERE ANXIETY/AGITATION). 30 capsule 0 unknown    Patient Stressors: Financial difficulties Marital or family conflict Medication change or noncompliance  Patient Strengths: Ability for Warden/ranger for treatment/growth  Treatment Modalities: Medication Management, Group therapy, Case management,  1 to 1 session with clinician, Psychoeducation, Recreational therapy.   Physician Treatment Plan for Primary Diagnosis: Schizoaffective disorder, bipolar type (HCC) Long Term Goal(s): Improvement in symptoms so as ready for discharge Improvement in symptoms so as ready for discharge   Short Term Goals: Ability to identify changes in lifestyle to reduce recurrence of condition will improve Ability to demonstrate self-control will improve Ability to identify and develop effective coping behaviors will improve Compliance with prescribed medications will improve Ability to identify triggers associated with substance abuse/mental health issues will improve Ability to identify changes in lifestyle to reduce recurrence of condition will improve Ability to verbalize feelings will improve Ability to demonstrate self-control will improve Compliance with prescribed medications will improve Ability to identify triggers associated with substance abuse/mental health issues will improve  Medication Management: Evaluate patient's response, side effects, and  tolerance of medication regimen.  Therapeutic Interventions: 1 to 1 sessions, Unit Group sessions and Medication administration.  Evaluation of  Outcomes: Progressing  Physician Treatment Plan for Secondary Diagnosis: Principal Problem:   Schizoaffective disorder, bipolar type (HCC) Active Problems:   IBS (irritable bowel syndrome)  Long Term Goal(s): Improvement in symptoms so as ready for discharge Improvement in symptoms so as ready for discharge   Short Term Goals: Ability to identify changes in lifestyle to reduce recurrence of condition will improve Ability to demonstrate self-control will improve Ability to identify and develop effective coping behaviors will improve Compliance with prescribed medications will improve Ability to identify triggers associated with substance abuse/mental health issues will improve Ability to identify changes in lifestyle to reduce recurrence of condition will improve Ability to verbalize feelings will improve Ability to demonstrate self-control will improve Compliance with prescribed medications will improve Ability to identify triggers associated with substance abuse/mental health issues will improve     Medication Management: Evaluate patient's response, side effects, and tolerance of medication regimen.  Therapeutic Interventions: 1 to 1 sessions, Unit Group sessions and Medication administration.  Evaluation of Outcomes: Progressing   RN Treatment Plan for Primary Diagnosis: Schizoaffective disorder, bipolar type (HCC) Long Term Goal(s): Knowledge of disease and therapeutic regimen to maintain health will improve  Short Term Goals: Ability to remain free from injury will improve, Ability to demonstrate self-control, Ability to disclose and discuss suicidal ideas and Compliance with prescribed medications will improve  Medication Management: RN will administer medications as ordered by provider, will assess and evaluate patient's response and provide education to patient for prescribed medication. RN will report any adverse and/or side effects to prescribing provider.  Therapeutic  Interventions: 1 on 1 counseling sessions, Psychoeducation, Medication administration, Evaluate responses to treatment, Monitor vital signs and CBGs as ordered, Perform/monitor CIWA, COWS, AIMS and Fall Risk screenings as ordered, Perform wound care treatments as ordered.  Evaluation of Outcomes: Progressing   LCSW Treatment Plan for Primary Diagnosis: Schizoaffective disorder, bipolar type (HCC) Long Term Goal(s): Safe transition to appropriate next level of care at discharge, Engage patient in therapeutic group addressing interpersonal concerns.  Short Term Goals: Engage patient in aftercare planning with referrals and resources, Increase social support, Facilitate acceptance of mental health diagnosis and concerns and Increase skills for wellness and recovery  Therapeutic Interventions: Assess for all discharge needs, 1 to 1 time with Social worker, Explore available resources and support systems, Assess for adequacy in community support network, Educate family and significant other(s) on suicide prevention, Complete Psychosocial Assessment, Interpersonal group therapy.  Evaluation of Outcomes: Progressing   Progress in Treatment: Attending groups: No. Participating in groups: No. Taking medication as prescribed: Yes. Toleration medication: Yes. Family/Significant other contact made: No, will contact:  patient has not consented for family contact Patient understands diagnosis: Yes. Discussing patient identified problems/goals with staff: Yes. Medical problems stabilized or resolved: Yes. Denies suicidal/homicidal ideation: Yes. Issues/concerns per patient self-inventory: No. Other: n/a  New problem(s) identified: None identified at this time.   New Short Term/Long Term Goal(s): None identified at this time.   Discharge Plan or Barriers: Patient is on the wait list for [facility].   Reason for Continuation of Hospitalization: Delusions  Hallucinations Medication  stabilization  Estimated Length of Stay: 7 days.   Attendees: Patient: Jessica ChaletJennifer L Brownrigg 03/21/2016 10:53 AM  Physician: Dr. Radene JourneyAndrea Hernandez, MD 03/21/2016 10:53 AM  Nursing: Hulan AmatoGwen Farrish, RN 03/21/2016 10:53 AM  RN Care Manager: 03/21/2016 10:53 AM  Social Worker: Hampton Abbot, Theresia Majors 03/21/2016 10:53 AM  Recreational Therapist: Jacquelynn Cree, LRT/CTRS 03/21/2016 10:53 AM    Scribe for Treatment Team: Lynden Oxford, LCSWA 03/21/2016 10:53 AM

## 2016-03-21 NOTE — [facility] Group Notes (Signed)
  [facility] LCSW Group Therapy Note  Date/Time: 03/21/16 1300  Type of Therapy/Topic:  Group Therapy:  Emotion Regulation  Participation Level:  Did Not Attend   Mood:  Description of Group:    The purpose of this group is to assist patients in learning to regulate negative emotions and experience positive emotions. Patients will be guided to discuss ways in which they have been vulnerable to their negative emotions. These vulnerabilities will be juxtaposed with experiences of positive emotions or situations, and patients challenged to use positive emotions to combat negative ones. Special emphasis will be placed on coping with negative emotions in conflict situations, and patients will process healthy conflict resolution skills.  Therapeutic Goals: 1. Patient will identify two positive emotions or experiences to reflect on in order to balance out negative emotions:  2. Patient will label two or more emotions that they find the most difficult to experience:  3. Patient will be able to demonstrate positive conflict resolution skills through discussion or role plays:   Summary of Patient Progress:   Therapeutic Modalities:   Cognitive Behavioral Therapy Feelings Identification Dialectical Behavioral Therapy  Greg Perrion Diesel, LCSW 

## 2016-03-21 NOTE — Plan of Care (Signed)
Problem: Activity: Goal: Sleeping patterns will improve Outcome: Progressing Patient slept for Estimated Hours of 5; every 15 minutes safety round maintained, no injury or falls during this shift.    

## 2016-03-21 NOTE — Progress Notes (Signed)
Pt presents with bright affect. Reports medication given last pm did not help with sleep. Pt been in bed resting this afternoon. Pt more alert.  Encouragement and support offered, safety checks maintained. Pt remains safe with q 15 min checks.

## 2016-03-21 NOTE — Progress Notes (Signed)
[facility] MD Progress Note  03/21/2016 2:09 PM Jessica [name]  MRN:  [id number] Subjective:  Patient is a 41 year old Caucasian female with history of schizoaffective disorder bipolar type. She was brought in by police [facility] department on January 3rd. Per the emergency departments note patient's mother called EMS after the patient grabbed her arm and wouldn't let go. Upon EMS arrival the patient was standing aggressively in the doorway and saying "[place]. There is too much evil in [place]". She was uncooperative with evaluation yelling occasionally. Nurses in the ER described her as agitated and refusing to dressed out in his scrubs tangential.  It has been reported patient was noncompliant with her medications. Per chart review she was hospitalized twice last year in behavioral health [place]. Her last admission was in June 2017. She was discharged with a diagnosis of schizoaffective disorder bipolar type. During her stay in the hospital she was placed on known emergency forced medications.   03/14/2016. Jessica [name] remains psychotic, disorganized, hyperreligous, isolative. She now accepts medications. She comes to medication room for meds and to dining area for food only. Otherwise, she is in bed with her eyes closed. Poor hygiene. No somatic complaitns. Complains of poor sleep last night. No group participation.   03/15/2016. Jessica [name] recently responded to my questions. She said that she was doing better. But then closed her eyes and did not answer any further questions. She is aware of her surroundings. The first time I went to her room this morning she was in the bathroom and was able to talk to me through the curtain. She did not voice any somatic complaints. There were no complaints of side effects. I started Haldol last night to address psychotic symptoms. She stays secluded to her room and to her bed with her eyes closed. She does not interact with peers or staff  but comes out for medications and meals.   03/16/2016 Jessica [name] again refused to participate in treatment team meeting. She refused to talk to the nurse. She was the most, so far, interactive with me. With her eyes closed and without any motion, she reported that she took a shower, went for a walk, ate breakfast and , yes, has been taking medications. She slept better. When asked if the medications are right, she said "no". When asked which medications she would prefer, she named Trileptal and Abilify. She indeed is on Trileptal and Abilify. I added Haldol just recently.  03/17/2016 The patient was initially reluctant to talk to this writer and is due from [place] [facility] but then eventually did speak to this Clinical research associatewriter. She did not appear like she was responding to internal stimuli but stated that she was hearing voices that were mild in nature. She refused to elaborate give any details on the content of the voices. She denied ever telling her to hurt herself or anyone else. She denies any current active or passive suicidal thoughts but mood has remained depressed. She says she has been repeatedly praying and exercising. He said she did get off any breakfast brush her teeth this morning. She was compliant with all psychotropic medications except for a multivitamin. She has not been going to groups and has been fairly isolative to her room. She denies any new somatic complaints. She denies any physical adverse side effects associate with medication. No signs of EPS. She has not been interacting with staff or peers.  03/18/2016: The patient initially spoke more to this writer today than  yesterday. She however then became mildly irritable and was refusing to answer some questions. She stated that she was feeling better overall but has continued to be isolative to her room and does not interact with staff and peers. She has been compliant with the Abilify and Trileptal but insight and judgment remain poor. The  patient denied that she wanted to hurt herself or anyone else. She denied any current active or passive suicidal thoughts. She did admit to having some auditory hallucinations but could not verbalize with the voices were saying and reported that it was "private" and that she would only talk about voices with her outpatient psychiatrist. She denied any visual hallucinations. She denied auditory hallucinations were command in nature. She denies any physical adverse side effects associated with the medication. Per nursing, she slept fairly well last night. The patient however sleeping on and off throughout the daytime. She denies any problems with her appetite and does eat her meals. No signs of EPS. Vital signs are stable.  1/22 Patient irritable, refuses to speak with me. Claims that she has lice and is asking for clippers to shave her head. Per nursing staff there is no evidence of days. Vision has been uncooperative. Despite taking Abilify patient has not improved. She has been during the hospital for 18 days with minimal improvement.  During the early part of her hospitalization she had responded well to olanzapine. She requested to have this medication change because she felt Abilify at work even better for her in the past. This was confirmed by her mother. Here she has been receiving Abilify 30 mg for several weeks with no response. At this point Abilify needs to be discontinued. I will retry her on olanzapine.   1/23 patient was less irritable and somewhat cooperative with assessment today. She continues to be delusional and paranoid. She sat and spoke with me. Patient says that her mother has been hospitalized and she would like to be discharged in order to help her mother. She also reports today she had visitors from her undergrad (there has been no visitation time today, nobody has come to visit the patient today). She reported having some concern about patient's having intercourse, she  complained about the "pimping going on here at the hospital" and having to perform sexual favors in return for things.   He we are doing something illegal here at the hospital giving her medications that are illegal.  She continues to report having auditory and visual hallucinations but mainly occur at nighttime.  She hears multiple voices of people talking. As far as side effects she complains of feeling that medication is causing blurry vision and makes her feel "out of it".   Patient reports feeling better. She denies problems with her mood and denies suicidality. She is actually requesting to be discharged soon.  Ate 50% breakfast and 100% of lunch.  Took meds yesterday and today. Not attending any programming.    1/24 Patient was very compliant when asked to attend a consult with me.  She appeared alert and engaged during the entirety of our conversation although she did not make direct eye contact.  The patient reports feeling "groggy" last night and had difficulty falling asleep due to feeling "wired"  from the medications she was given. She reports feeling more active and told me she attended group therAPY,(although her chart says she did not attend) THIS MORNING ALTHOUGH SHE IS NOT CONFIDENT THAT she will benefit from it.   The patient reports  feeling more energetic and hopes to start getting more exercise by walking.  She reports eating a decent breakfast and APPEARS PLEASED with her improved state.  Patient denies any depressive symptoms, suicidal ideations or thoughts of harming herself or others.  Patients states that she continues to have auditory hallucinations at night, but was reluctant to give any more detail.  She states concerns that the hospital is giving her "too much"  Or higher than needed dosages of her meds.  She began to cry and tell me about her fear of hospitals due to past traumatic events. Patient also stated request that paint scheme in patient rooms created "bad  chi"  And would like to see brighter colors as well as a garden to plant herbs, spices, etc.  Patient took meds yesterday and today and seems be responding positively to this regimen.  She reports negative side effects of feeling sedated and "out of it".Patient denies any physical complaints.  Patient ate 3 meals yesterday, and patient expresses interest in continuing to eat meals.     Per nursing: Observed sitting up in bed at the start of shift, personal clothes in her laps all covered with her jacket; she is more alert today although she remains isolated to room, pleasant on approach, still have ongoing thought blocking; fluids encouraged.     Principal Problem: Schizoaffective disorder, bipolar type (HCC) Diagnosis:       Patient Active Problem List   Diagnosis Date Noted  . Schizoaffective disorder, bipolar type (HCC) [F25.0] 07/25/2015   Total Time spent with patient: 20 minutes  Past Psychiatric History: Patient has been hospitalized several times at behavioral health in [place]. She was just discharged in July 2017.  I don't have any information regarding prior suicidal attempts or self-injurious behaviors   Past Medical History:      Past Medical History:  Diagnosis Date  . Anxiety   . Bipolar 1 disorder (HCC)   . Depression   . Schizophrenia (HCC)    History reviewed. No pertinent surgical history.  Family History:       Family History  Problem Relation Age of Onset  . Hypertension Mother   . Mental illness Father    Family Psychiatric  History: Unknown at this point as patient is uncooperative   Social History: Not much information is available at this time other than she lives with her mother in [place]. 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 Marital status: Single    Spouse name:  N/A  . Number of children: N/A  . Years of education: N/A       Social History Main Topics  . Smoking status: Never Smoker  . Smokeless tobacco: Never Used  . Alcohol use No  . Drug use: No  . Sexual activity: No       Other Topics Concern  . None      Social History Narrative  . None   Additional Social History:  Pain Medications: none Prescriptions: none Over the Counter: none History of alcohol / drug use?: No history of alcohol / drug abuse Negative Consequences of Use: Personal relationships, Financial, Work / School Withdrawal Symptoms: Other (Comment)  Current Medications:          Current  Facility-Administered Medications  Medication Dose Route Frequency Provider Last Rate Last Dose  . acetaminophen (TYLENOL) tablet 650 mg  650 mg Oral Q6H PRN Jimmy Footman, MD      . alum & mag hydroxide-simeth (MAALOX/MYLANTA) 200-200-20 MG/5ML suspension 30 mL  30 mL Oral Q4H PRN Jimmy Footman, MD      . feeding supplement (ENSURE ENLIVE) (ENSURE ENLIVE) liquid 237 mL  237 mL Oral TID BM Jimmy Footman, MD   237 mL at 03/18/16 0803  . LORazepam (ATIVAN) tablet 2 mg  2 mg Oral BID Jimmy Footman, MD   2 mg at 03/20/16 1610  . magnesium hydroxide (MILK OF MAGNESIA) suspension 30 mL  30 mL Oral Daily PRN Jimmy Footman, MD      . multivitamin with minerals tablet 1 tablet  1 tablet Oral Daily Jimmy Footman, MD   1 tablet at 03/20/16 0818  . OLANZapine zydis (ZYPREXA) disintegrating tablet 10 mg  10 mg Oral BID Jimmy Footman, MD   10 mg at 03/20/16 0815  . Oxcarbazepine (TRILEPTAL) tablet 300 mg  300 mg Oral BID Jimmy Footman, MD   300 mg at 03/20/16 9604    Lab Results:  Lab Results Last 48 Hours  No results found for this or any previous visit (from the past 48 hour(s)).    Blood Alcohol level:  Recent Labs       Lab Results  Component Value Date   ETH <5 02/29/2016    ETH <5 08/03/2015      Metabolic Disorder Labs: Recent Labs       Lab Results  Component Value Date   HGBA1C 5.4 03/07/2016   MPG 108 03/07/2016   MPG 117 07/27/2015     Recent Labs       Lab Results  Component Value Date   PROLACTIN 20.7 07/27/2015     Recent Labs       Lab Results  Component Value Date   CHOL 165 03/07/2016   TRIG 67 03/07/2016   HDL 48 03/07/2016   CHOLHDL 3.4 03/07/2016   VLDL 13 03/07/2016   LDLCALC 104 (H) 03/07/2016   LDLCALC 95 07/27/2015      Physical Findings: AIMS:  , ,  ,  ,    CIWA:    COWS:  COWS Total Score: 1  Musculoskeletal: Strength & Muscle Tone: within normal limits Gait & Station: normal Patient leans: N/A  Psychiatric Specialty Exam: Physical Exam  Nursing note and vitals reviewed. Constitutional: She is oriented to person, place, and time. She appears well-nourished.  HENT:  Head: Normocephalic and atraumatic.  Eyes: Conjunctivae and EOM are normal.  Neck: Normal range of motion.  Respiratory: Effort normal.  Musculoskeletal: Normal range of motion.  Neurological: She is alert and oriented to person, place, and time.    Review of Systems  Constitutional: Negative.   HENT: Negative.   Eyes: Negative.   Respiratory: Negative.   Cardiovascular: Negative.   Gastrointestinal: Negative.   Genitourinary: Negative.   Musculoskeletal: Negative.   Skin: Negative.   Neurological: Negative.   Endo/Heme/Allergies: Negative.   Psychiatric/Behavioral: Positive for auditory hallucinations. The patient has insomnia.   All other systems reviewed and are negative.   Blood pressure 130/90, pulse (!) 109, temperature 98.3 F (36.8 C), temperature source Oral, resp. rate 18, height 5\' 4"  (1.626 m), weight 90.7 kg (200 lb), last menstrual period 02/27/2016, SpO2 98 %.Body mass index is 34.33 kg/m.  General Appearance: pleasant and clean  Eye Contact:  Minimal  Speech:  Normal Rate  Volume:  Normal   Mood:  less Irrtiablte   Affect:  Constricted  Thought Process:  Disorganized and Descriptions of Associations: Loose  Orientation:  Full (Time, Place, and Person)  Thought Content:  hallucinations  Suicidal Thoughts:  No  Homicidal Thoughts:  No  Memory:  Immediate;   Poor Recent;   Poor Remote;   Poor  Judgement:  Impaired  Insight:  Lacking  Psychomotor Activity:  Decreased  Concentration:  Concentration: improved and Attention Span: improved  Recall:  Poor  Fund of Knowledge:  Poor  Language:  Fair  Akathisia:  No  Handed:    AIMS (if indicated):     Assets:  Communication Skills Leisure Time  ADL's:  Intact  Cognition:  Impaired,  Mild  Sleep:  Number of Hours: 8.15     Treatment Plan Summary:  Patient is a 41 year old Caucasian female with history of schizoaffective bipolar type. She presented to the emergency department due to aggression, agitation, and psychosis. Patient has not been compliant with medications.  For schizoaffective disorder: Lack of response to Abilify monotherapy. Lack of response to Abilify in combination with haloperidol 5 mg twice a day. This point in time haloperidol and Abilify will be discontinued. She will be retried on olanzapine.  Continue olanzapine zydis 10 mg twice a day   For possible catatonia: Patient appears to have some catatonia during the early part of her hospitalization. She lately has not been receiving Ativan. Continue Ativan 2 mg twice a day which appears to be helping the patient. She has been more talkative and has been leaving her room more today.  For agitation: continue Ativan 2 mg every 4 hours as needed  Patient require Manual Hold on 1/4 as she refused to leave the office where she was being interviewed on 1/4.   Continue multivitamins  Poor Dietary Intake: continue to encourage eating regular meals  Continue Vital signs daily  Continue to push fluids. Continue Ensure tid. Fair oral intake.    Precautions every 15 minute checks  Hospitalization status continue involuntary commitment------ referral has been [facility] to [facility].  Letter recommending guardian she was mailed to the patient's mother on 1/11.   Labs: TSH, hemoglobin A1c and lipid panel, b12, BMP--has refused all labs---finally cooperative with labs on 1/10.  All wnl  Collateral information has been  obtained from the patient's mother. Patient has declined for [place] to have any contact with the mother however at this point in time she does not have capacity to make that decision. She is very psychotic. Mother has been provided for a letter supporting the need for patient to have a guardian. Will contact mother to see how the process of guardianship is going.   Will order a basic metabolic panel as patient has not had any labs since January 10.  Spoke with patient's mother at 1:30pm today (1/24). The mother says she is trying to get the forms for legal guardianship finalized.  Patient's mother verbally agreed to go along with treatment team's recommendations whatever they are.  We agreed to touch base via phone next Monday 1/28 at 1:30pm.   Jessica Glen, PA-S 03/21/2016, 9:20 AM

## 2016-03-21 NOTE — Progress Notes (Addendum)
[facility] MD Progress Note  03/21/2016 9:20 AM Jessica [name]  MRN:  [id number] Subjective:  Patient is a 41 year old Caucasian female with history of schizoaffective disorder bipolar type. She was brought in by police to [facility] emergency department on January 3rd. Per the emergency departments note patient's mother called EMS after the patient grabbed her arm and wouldn't let go. Upon EMS arrival the patient was standing aggressively in the doorway and saying "[name], [name]. There is too much evil in [place]". She was uncooperative with evaluation yelling occasionally. Nurses in the ER described her as agitated and refusing to dressed out in his scrubs tangential.  It has been reported patient was noncompliant with her medications.  Per chart review she was hospitalized twice last year in behavioral health [place]. Her last admission was in June 2017. She was discharged with a diagnosis of schizoaffective disorder bipolar type. During her stay in the hospital she was placed on known emergency forced medications.   03/14/2016. Jessica [name] remains psychotic, disorganized, hyperreligous, isolative. She now accepts medications. She comes to medication room for meds and to dining area for food only. Otherwise, she is in bed with her eyes closed. Poor hygiene. No somatic complaitns. Complains of poor sleep last night. No group participation.   03/15/2016. Jessica [name] recently responded to my questions. She said that she was doing better. But then closed her eyes and did not answer any further questions. She is aware of her surroundings. The first time I went to her room this morning she was in the bathroom and was able to talk to me through the curtain. She did not voice any somatic complaints. There were no complaints of side effects. I started Haldol last night to address psychotic symptoms. She stays secluded to her room and to her bed with her eyes closed. She does not interact with peers or staff but  comes out for medications and meals.   03/16/2016 Jessica [name] again refused to participate in treatment team meeting. She refused to talk to the nurse. She was the most, so far, interactive with me. With her eyes closed and without any motion, she reported that she took a shower, went for a walk, ate breakfast and , yes, has been taking medications. She slept better. When asked if the medications are right, she said "no". When asked which medications she would prefer, she named Trileptal and Abilify. She indeed is on Trileptal and Abilify. I added Haldol just recently.  03/17/2016 The patient was initially reluctant to talk to this writer and is due from [place] program but then eventually did speak to this Probation officer. She did not appear like she was responding to internal stimuli but stated that she was hearing voices that were mild in nature. She refused to elaborate give any details on the content of the voices. She denied ever telling her to hurt herself or anyone else. She denies any current active or passive suicidal thoughts but mood has remained depressed. She says she has been repeatedly praying and exercising. He said she did get off any breakfast brush her teeth this morning. She was compliant with all psychotropic medications except for a multivitamin. She has not been going to groups and has been fairly isolative to her room. She denies any new somatic complaints. She denies any physical adverse side effects associate with medication. No signs of EPS. She has not been interacting with staff or peers.  03/18/2016: The patient initially spoke more to this Probation officer today  than yesterday. She however then became mildly irritable and was refusing to answer some questions. She stated that she was feeling better overall but has continued to be isolative to her room and does not interact with staff and peers. She has been compliant with the Abilify and Trileptal but insight and judgment remain poor. The patient  denied that she wanted to hurt herself or anyone else. She denied any current active or passive suicidal thoughts. She did admit to having some auditory hallucinations but could not verbalize with the voices were saying and reported that it was "private" and that she would only talk about voices with her outpatient psychiatrist. She denied any visual hallucinations. She denied auditory hallucinations were command in nature. She denies any physical adverse side effects associated with the medication. Per nursing, she slept fairly well last night. The patient however sleeping on and off throughout the daytime. She denies any problems with her appetite and does eat her meals. No signs of EPS. Vital signs are stable.  1/22 Patient irritable, refuses to speak with me. Claims that she has lice and is asking for clippers to shave her head. Per nursing staff there is no evidence of days. Vision has been uncooperative. Despite taking Abilify patient has not improved. She has been during the hospital for 18 days with minimal improvement.  During the early part of her hospitalization she had responded well to olanzapine. She requested to have this medication change because she felt Abilify at work even better for her in the past. This was confirmed by her mother. Here she has been receiving Abilify 30 mg for several weeks with no response. At this point Abilify needs to be discontinued. I will retry her on olanzapine.   1/23 patient was less irritable and somewhat cooperative with assessment today. She continues to be delusional and paranoid. She sat and spoke with me. Patient says that her mother has been hospitalized and she would like to be discharged in order to help her mother. She also reports today she had visitors from her undergrad (there has been no visitation time today, nobody has come to visit the patient today). She reported having some concern about patient's having intercourse, she complained about the  "pimping going on here at the hospital" and having to perform sexual favors in return for things.   He we are doing something illegal here at the hospital giving her medications that are illegal.  She continues to report having auditory and visual hallucinations but mainly occur at nighttime.  She hears multiple voices of people talking. As far as side effects she complains of feeling that medication is causing blurry vision and makes her feel "out of it".   Patient reports feeling better. She denies problems with her mood and denies suicidality. She is actually requesting to be discharged soon.  Ate 50% breakfast and 100% of lunch.  Took meds yesterday and today. Not attending any programming.  1/24 Jessica [name] was seen in treatment team today for the first time in 20 days. She participated and asked questions appropriately.  She continued very disheveled. She reported feeling a little overly sedated with the medications. But did not voice any other problems or concerns. She denied having major problems with mood. Feels that her appetite has been improving. Continues to have auditory hallucinations at night. She denies having any suicidality or homicidality. No delusional thinking was noted.   100% compliant with medications. Appears that oral intake has improved, patient had 3 meals yesterday.  Ate 50% of 2 meals and 100% of lunch. Patient was cooperative with labs this morning. Patient continues to refuse  to participate in programming.  Per nursing: Observed sitting up in bed at the start of shift, personal clothes in her laps all covered with her jacket; she is more alert today although she remains isolated to room, pleasant on approach, still have ongoing thought blocking; fluids encouraged  Principal Problem: Schizoaffective disorder, bipolar type ([place]) Diagnosis:   Patient Active Problem List   Diagnosis Date Noted  . Schizoaffective disorder, bipolar type ([place]) [F25.0] 07/25/2015    Total Time spent with patient: 30 minutes  Past Psychiatric History: Patient has been hospitalized several times at behavioral health in [place]. She was just discharged in July 2017.  I don't have any information regarding prior suicidal attempts or self-injurious behaviors   Past Medical History:  Past Medical History:  Diagnosis Date  . Anxiety   . Bipolar 1 disorder ([place])   . Depression   . Schizophrenia ([place])    History reviewed. No pertinent surgical history.  Family History:  Family History  Problem Relation Age of Onset  . Hypertension Mother   . Mental illness Father    Family Psychiatric  History: Unknown at this point as patient is uncooperative   Social History: Not much information is available at this time other than she lives with her mother in [place]. 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 Marital status: Single    Spouse name: N/A  . Number of children: N/A  . Years of education: N/A   Social History Main Topics  . Smoking status: Never Smoker  . Smokeless tobacco: Never Used  . Alcohol use No  . Drug use: No  . Sexual activity: No   Other Topics Concern  . None   Social History Narrative  . None   Additional Social History:    Pain Medications: none Prescriptions: none Over the Counter: none History of alcohol / drug use?: No history of alcohol / drug abuse Negative Consequences of Use: Personal relationships, Financial, Work / School Withdrawal Symptoms: Other (Comment)      Current Medications: Current Facility-Administered Medications  Medication Dose Route Frequency Provider Last Rate Last Dose  . acetaminophen (TYLENOL) tablet 650 mg  650 mg Oral Q6H PRN Hildred Priest, MD      . alum & mag hydroxide-simeth (MAALOX/MYLANTA) 200-200-20 MG/5ML suspension 30 mL  30 mL Oral Q4H PRN  Hildred Priest, MD      . feeding supplement (ENSURE ENLIVE) (ENSURE ENLIVE) liquid 237 mL  237 mL Oral TID BM Hildred Priest, MD   237 mL at 03/18/16 0803  . LORazepam (ATIVAN) tablet 2 mg  2 mg Oral BID Hildred Priest, MD   2 mg at 03/21/16 0801  . magnesium hydroxide (MILK OF MAGNESIA) suspension 30 mL  30 mL Oral Daily PRN Hildred Priest, MD      . multivitamin with minerals tablet 1 tablet  1 tablet Oral Daily Hildred Priest, MD   1 tablet at 03/21/16 0801  . OLANZapine zydis (ZYPREXA) disintegrating tablet 10 mg  10 mg Oral BID Hildred Priest, MD   10 mg at 03/21/16 0801  . Oxcarbazepine (TRILEPTAL) tablet 300 mg  300 mg Oral BID Hildred Priest, MD   300 mg at 03/21/16  0801    Lab Results:  Results for orders placed or performed during the hospital encounter of 03/01/16 (from the past 48 hour(s))  Basic metabolic panel     Status: Abnormal   Collection Time: 03/21/16  7:09 AM  Result Value Ref Range   Sodium 141 135 - 145 mmol/L   Potassium 3.5 3.5 - 5.1 mmol/L   Chloride 105 101 - 111 mmol/L   CO2 26 22 - 32 mmol/L   Glucose, Bld 99 65 - 99 mg/dL   BUN 14 6 - 20 mg/dL   Creatinine, Ser 0.67 0.44 - 1.00 mg/dL   Calcium 8.6 (L) 8.9 - 10.3 mg/dL   GFR calc non Af Amer >60 >60 mL/min   GFR calc Af Amer >60 >60 mL/min    Comment: (NOTE) The eGFR has been calculated using the CKD EPI equation. This calculation has not been validated in all clinical situations. eGFR's persistently <60 mL/min signify possible Chronic Kidney Disease.    Anion gap 10 5 - 15    Blood Alcohol level:  Lab Results  Component Value Date   ETH <5 02/29/2016   ETH <5 30/08/6224    Metabolic Disorder Labs: Lab Results  Component Value Date   HGBA1C 5.4 03/07/2016   MPG 108 03/07/2016   MPG 117 07/27/2015   Lab Results  Component Value Date   PROLACTIN 20.7 07/27/2015   Lab Results  Component Value Date   CHOL 165  03/07/2016   TRIG 67 03/07/2016   HDL 48 03/07/2016   CHOLHDL 3.4 03/07/2016   VLDL 13 03/07/2016   LDLCALC 104 (H) 03/07/2016   [place] 95 07/27/2015    Physical Findings: AIMS:  , ,  ,  ,    CIWA:    COWS:  COWS Total Score: 1  Musculoskeletal: Strength & Muscle Tone: within normal limits Gait & Station: normal Patient leans: N/A  Psychiatric Specialty Exam: Physical Exam  Nursing note and vitals reviewed. Constitutional: She is oriented to person, place, and time. She appears well-nourished.  HENT:  Head: Normocephalic and atraumatic.  Eyes: Conjunctivae and EOM are normal.  Neck: Normal range of motion.  Respiratory: Effort normal.  Musculoskeletal: Normal range of motion.  Neurological: She is alert and oriented to person, place, and time.    Review of Systems  Constitutional: Negative.   HENT: Negative.   Eyes: Negative.   Respiratory: Negative.   Cardiovascular: Negative.   Gastrointestinal: Negative.   Genitourinary: Negative.   Musculoskeletal: Negative.   Skin: Negative.   Neurological: Negative.   Endo/Heme/Allergies: Negative.   Psychiatric/Behavioral: Positive for hallucinations. The patient has insomnia.   All other systems reviewed and are negative.   Blood pressure 114/76, pulse 96, temperature 98.7 F (37.1 C), temperature source Oral, resp. rate 18, height 5' 4"  (1.626 m), weight 90.7 kg (200 lb), last menstrual period 02/27/2016, SpO2 98 %.Body mass index is 34.33 kg/m.  General Appearance: Disheveled  Eye Contact:  Minimal  Speech:  Normal Rate  Volume:  Normal  Mood:  less Irrtiablte   Affect:  Constricted  Thought Process:  Disorganized and Descriptions of Associations: Loose  Orientation:  Full (Time, Place, and Person)  Thought Content:  Delusions and hallucinations  Suicidal Thoughts:  No  Homicidal Thoughts:  No  Memory:  Immediate;   Poor Recent;   Poor Remote;   Poor  Judgement:  Impaired  Insight:  Lacking  Psychomotor  Activity:  Decreased  Concentration:  Concentration: Poor and Attention Span:  Poor  Recall:  Poor  Fund of Knowledge:  Poor  Language:  Fair  Akathisia:  No  Handed:    AIMS (if indicated):     Assets:  Communication Skills Leisure Time  ADL's:  Intact  Cognition:  Impaired,  Mild  Sleep:  Number of Hours: 5     Treatment Plan Summary:  No improvement since admission. Basically nonverbal. Withdrawn to her room minimal oral intake. Only eating one meal a day and Ativan in she is only eating about 50%. She has been drinking ensures per nursing staff.  Patient is a 41 year old Caucasian female with history of schizoaffective bipolar type. He presented to the emergency department due to aggression, agitation, and psychosis. Patient has not been compliant with medications.  For schizoaffective disorder: Lack of response to Abilify monotherapy. Lack of response to Abilify in combination with haloperidol 5 mg twice a day. This point in time haloperidol and Abilify will be discontinued. She will be retried on olanzapine.  Continue olanzapine zydis due to her complaints of daytime sedation I will change the olanzapine to 20 mg by mouth daily at bedtime instead of 10 mg twice a day.  For catatonia: Patient has definitely improved with Ativan. She complains of feeling sedated and "out of it. She does not appear to be sedated during examination. I will slightly decreased Ativan from 2 mg twice a day to 1 mg in the morning and 2 mg at night.  For agitation: continue Ativan 2 mg every 4 hours as needed  Patient require Manual Hold on 1/4 as she refused to leave the office where she was being interviewed on 1/4.   Ordered multivitamins  Diet regular  Vital signs daily--- stable vital signs today  Continue to push fluids. Ordered ensure tid (has been refusing that over the last 2 days as her oral intake has improved). Fair oral intake.   Precautions every 15 minute  checks  Hospitalization status continue involuntary commitment------ referral has been made to [place] recommending guardian she was mailed to the patient's mother on 1/11.   Labs: TSH, hemoglobin A1c and lipid panel, b12, BMP--has refused all labs---finally cooperative with labs on 1/10.  All wnl.  Basic metabolic panel from this morning was within the normal limits  Collateral information has been  obtained from the patient's mother. Patient has declined for [place] to have any contact with the mother however at this point in time she does not have capacity to make that decision. She is very psychotic. Mother has been provided for a letter supporting the need for patient to have a guardian.   I contacted the patient's mother yesterday, left a voicemail for her. We will try to schedule a phone conference with her at 1:00 today. (spoke with mother along with SW.  Mother completed application for guardianship and will submit paper work to the court Tomorrow.  We will talk again on Monday at 1:96 pm)   I certify that the services received since the previous certification/recertification were and continue to be medically necessary as the treatment provided can be reasonably expected to improve the patient's condition; the medical record documents that the services furnished were intensive treatment services or their equivalent services, and this patient continues to need, on a daily basis, active treatment furnished directly by or requiring the supervision of inpatient psychiatric personnel.   Pt still not stable or ready for discharge. No safe discharge plan in place.   Hildred Priest,  MD 03/21/2016, 9:20 AM

## 2016-03-21 NOTE — Plan of Care (Signed)
Problem: Safety: Goal: Ability to remain free from injury will improve Outcome: Progressing No self injury reported or observed   

## 2016-03-21 NOTE — Progress Notes (Signed)
Recreation Therapy Notes  Date: 01.24.18 Time: 9:30 am Location: Craft Room  Group Topic: Self-esteem  Goal Area(s) Addresses:  Patient will write at least one positive trait about self. Patient will verbalize benefit of having a healthy self-esteem.  Behavioral Response: Did not attend  Intervention: I Am  Activity: Patients were given a worksheet with the letter I on it and were instructed to write as many positive traits about themselves inside the letter.   Education: LRT educated patients on ways to increase their self-esteem.  Education Outcome: Patient did not attend group.   Clinical Observations/Feedback: Patient did not attend group.  Aldrick Derrig M, LRT/CTRS 03/21/2016 10:03 AM 

## 2016-03-22 MED ORDER — ENSURE ENLIVE PO LIQD
237.0000 mL | Freq: Two times a day (BID) | ORAL | Status: DC
  Administered 2016-03-23 – 2016-04-02 (×13): 237 mL via ORAL

## 2016-03-22 NOTE — [facility] Group Notes (Addendum)
[facility] LCSW Group Therapy Note  Date/Time 03/22/16 1300  Type of Therapy/Topic:  Group Therapy:  Balance in Life  Participation Level:  moderate  Description of Group:    This group will address the concept of balance and how it feels and looks when one is unbalanced. Patients will be encouraged to process areas in their lives that are out of balance, and identify reasons for remaining unbalanced. Facilitators will guide patients utilizing problem- solving interventions to address and correct the stressor making their life unbalanced. Understanding and applying boundaries will be explored and addressed for obtaining  and maintaining a balanced life. Patients will be encouraged to explore ways to assertively make their unbalanced needs known to significant others in their lives, using other group members and facilitator for support and feedback.  Therapeutic Goals: 1. Patient will identify two or more emotions or situations they have that consume much of in their lives. 2. Patient will identify signs/triggers that life has become out of balance:  3. Patient will identify two ways to set boundaries in order to achieve balance in their lives:  4. Patient will demonstrate ability to communicate their needs through discussion and/or role plays  Summary of Patient Progress: Pt spoke up immediately talking about how work stress made her life out of balance and caused her to quit her job.  She also stated personal relationships and mental health were areas that are out of balance.  PT left in the middle of the group.    Therapeutic Modalities:   Cognitive Behavioral Therapy Solution-Focused Therapy Assertiveness Training  Daleen SquibbGreg Jahlil Ziller, [place]CS[place]

## 2016-03-22 NOTE — Plan of Care (Signed)
Problem: Safety: Goal: Ability to redirect hostility and anger into socially appropriate behaviors will improve Outcome: Progressing Pt goal today is "to be kind with other people including the staff in this hospital" by "grateful and mindful." Pt much more cooperative, pleasant. Improvement noted in mood and interactions.

## 2016-03-22 NOTE — Progress Notes (Signed)
D: Patient is alert and disoriented on the unit this shift. Patient not attended and actively participated in groups today. Patient denies suicidal ideation, homicidal ideation, auditory or visual hallucinations at the present time.  A: Scheduled medications are administered to patient as per MD orders. Emotional support and encouragement are provided. Patient is maintained on q.15 minute safety checks. Patient is informed to notify staff with questions or concerns. R: No adverse medication reactions are noted. Patient is cooperative with medication administration  . Patient is nonreceptive, calm,pleasant,confused and cooperative on the unit at this time. Patient does not interact with others on the unit this shift. Patient contracts for safety at this time. Patient remains safe at this time.

## 2016-03-22 NOTE — Plan of Care (Signed)
Problem: Coping: Goal: Ability to verbalize feelings will improve Outcome: Not Progressing Patient not able to verbalize frustrations or feelings at this time [facility]

## 2016-03-22 NOTE — [facility] Group Notes (Signed)
[facility] Group Notes:  (Nursing/MHT/Case Management/Adjunct)  Date:  03/22/2016  Time:  6:06 PM  Type of Therapy:  Psychoeducational Skills  Participation Level:  Did Not Attend  Jessica [name] Jessica [name] 03/22/2016, 6:06 PM

## 2016-03-22 NOTE — Progress Notes (Signed)
[facility] MD Progress Note  03/22/2016 9:58 AM ZABDI MIS  MRN:  [id number] Subjective:  Patient is a 41 year old Caucasian female with history of schizoaffective disorder bipolar type. She was brought in by police to [facility] emergency department on January 3rd. Per the emergency departments note patient's mother called EMS after the patient grabbed her arm and wouldn't let go. Upon EMS arrival the patient was standing aggressively in the doorway and saying "[name], [name]. There is too much evil in [place]". She was uncooperative with evaluation yelling occasionally. Nurses in the ER described her as agitated and refusing to dressed out in his scrubs tangential.  It has been reported patient was noncompliant with her medications.  Per chart review she was hospitalized twice last year in behavioral health [place]. Her last admission was in June 2017. She was discharged with a diagnosis of schizoaffective disorder bipolar type. During her stay in the hospital she was placed on known emergency forced medications.    1/22 Patient irritable, refuses to speak with me. Claims that she has lice and is asking for clippers to shave her head. Per nursing staff there is no evidence of days. Vision has been uncooperative. Despite taking Abilify patient has not improved. She has been during the hospital for 18 days with minimal improvement.  During the early part of her hospitalization she had responded well to olanzapine. She requested to have this medication change because she felt Abilify at work even better for her in the past. This was confirmed by her mother. Here she has been receiving Abilify 30 mg for several weeks with no response. At this point Abilify needs to be discontinued. I will retry her on olanzapine.   1/23 patient was less irritable and somewhat cooperative with assessment today. She continues to be delusional and paranoid. She sat and spoke with me. Patient says that her mother has  been hospitalized and she would like to be discharged in order to help her mother. She also reports today she had visitors from her undergrad (there has been no visitation time today, nobody has come to visit the patient today). She reported having some concern about patient's having intercourse, she complained about the "pimping going on here at the hospital" and having to perform sexual favors in return for things.   He we are doing something illegal here at the hospital giving her medications that are illegal.  She continues to report having auditory and visual hallucinations but mainly occur at nighttime.  She hears multiple voices of people talking. As far as side effects she complains of feeling that medication is causing blurry vision and makes her feel "out of it".   Patient reports feeling better. She denies problems with her mood and denies suicidality. She is actually requesting to be discharged soon.  Ate 50% breakfast and 100% of lunch.  Took meds yesterday and today. Not attending any programming.  1/24 [place] was seen in treatment team today for the first time in 20 days. She participated and asked questions appropriately.  She continued very disheveled. She reported feeling a little overly sedated with the medications. But did not voice any other problems or concerns. She denied having major problems with mood. Feels that her appetite has been improving. Continues to have auditory hallucinations at night. She denies having any suicidality or homicidality. No delusional thinking was noted.   100% compliant with medications. Appears that oral intake has improved, patient had 3 meals yesterday. Ate 50% of 2 meals and  100% of another one. Patient was cooperative with labs this morning. Patient continues to refuse  to participate in programming.  1/25 patient sat up in bed and spoke with this Probation officer. She was pleasant and cooperative. She did not voice any delusions today. Patient has  reported improvement in mood and appetite. She says that the hallucinations continue to occur but they're "quick and blurry". She is tolerating medications well her only concern is some sedation however with the recent changes made she feels less tired. She denies major issues with mood, she denies suicidality, homicidality. She denies any physical complaints.  She was ok today to have [place] contact her mother and provide her with the security code.   100% compliant with medications. Appears that oral intake has improved since Monday, patient had 3 meals yesterday. Ate 50% of 2 meals and 100% of another one.    Per nursing: D: Patient is alert and disoriented on the unit this shift. Patient not attended and actively participated in groups today. Patient denies suicidal ideation, homicidal ideation, auditory or visual hallucinations at the present time.  A: Scheduled medications are administered to patient as per MD orders. Emotional support and encouragement are provided. Patient is maintained on q.15 minute safety checks. Patient is informed to notify staff with questions or concerns. R: No adverse medication reactions are noted. Patient is cooperative with medication administration  . Patient is nonreceptive, calm,pleasant,confused and cooperative on the unit at this time. Patient does not interact with others on the unit this shift. Patient contracts for safety at this time. Patient remains safe at this time.  Principal Problem: Schizoaffective disorder, bipolar type ([place]) Diagnosis:   Patient Active Problem List   Diagnosis Date Noted  . IBS (irritable bowel syndrome) [K58.9] 03/21/2016  . Schizoaffective disorder, bipolar type ([place]) [F25.0] 07/25/2015   Total Time spent with patient: 30 minutes  Past Psychiatric History: Patient has been hospitalized several times at behavioral health in [place]. She was just discharged in July 2017.  I don't have any information regarding prior suicidal  attempts or self-injurious behaviors   Past Medical History:  Past Medical History:  Diagnosis Date  . Anxiety   . Bipolar 1 disorder ([place])   . Depression   . Schizophrenia ([place])    History reviewed. No pertinent surgical history.  Family History:  Family History  Problem Relation Age of Onset  . Hypertension Mother   . Mental illness Father    Family Psychiatric  History: Unknown at this point as patient is uncooperative   Social History: Not much information is available at this time other than she lives with her mother in [place]. 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 Marital status: Single    Spouse name: N/A  . Number of children: N/A  . Years of education: N/A   Social History Main Topics  . Smoking status: Never Smoker  . Smokeless tobacco: Never Used  . Alcohol use No  . Drug use: No  . Sexual activity: No   Other Topics Concern  . None   Social History Narrative  . None   Additional Social History:    Pain Medications: none Prescriptions: none Over the Counter: none History of alcohol / drug use?: No history of alcohol / drug abuse Negative Consequences of Use: Personal relationships,  Financial, Work / School Withdrawal Symptoms: Other (Comment)      Current Medications: Current Facility-Administered Medications  Medication Dose Route Frequency Provider Last Rate Last Dose  . acetaminophen (TYLENOL) tablet 650 mg  650 mg Oral Q6H PRN Hildred Priest, MD      . alum & mag hydroxide-simeth (MAALOX/MYLANTA) 200-200-20 MG/5ML suspension 30 mL  30 mL Oral Q4H PRN Hildred Priest, MD      . feeding supplement (ENSURE ENLIVE) (ENSURE ENLIVE) liquid 237 mL  237 mL Oral TID BM Hildred Priest, MD   Stopped at 03/21/16 2000  . LORazepam (ATIVAN) tablet 1 mg  1 mg Oral Daily Hildred Priest, MD   1 mg at 03/22/16 0804  . LORazepam (ATIVAN) tablet 2 mg  2 mg Oral QHS Hildred Priest, MD      . magnesium hydroxide (MILK OF MAGNESIA) suspension 30 mL  30 mL Oral Daily PRN Hildred Priest, MD   30 mL at 03/21/16 1715  . multivitamin with minerals tablet 1 tablet  1 tablet Oral Daily Hildred Priest, MD   1 tablet at 03/21/16 0801  . OLANZapine (ZYPREXA) tablet 20 mg  20 mg Oral QHS Hildred Priest, MD      . Oxcarbazepine (TRILEPTAL) tablet 300 mg  300 mg Oral BID Hildred Priest, MD   300 mg at 03/22/16 0804    Lab Results:  Results for orders placed or performed during the hospital encounter of 03/01/16 (from the past 48 hour(s))  Basic metabolic panel     Status: Abnormal   Collection Time: 03/21/16  7:09 AM  Result Value Ref Range   Sodium 141 135 - 145 mmol/L   Potassium 3.5 3.5 - 5.1 mmol/L   Chloride 105 101 - 111 mmol/L   CO2 26 22 - 32 mmol/L   Glucose, Bld 99 65 - 99 mg/dL   BUN 14 6 - 20 mg/dL   Creatinine, Ser 0.67 0.44 - 1.00 mg/dL   Calcium 8.6 (L) 8.9 - 10.3 mg/dL   GFR calc non Af Amer >60 >60 mL/min   GFR calc Af Amer >60 >60 mL/min    Comment: (NOTE) The eGFR has been calculated using the CKD EPI equation. This calculation has not been validated in all clinical situations. eGFR's persistently <60 mL/min signify possible Chronic Kidney Disease.    Anion gap 10 5 - 15    Blood Alcohol level:  Lab Results  Component Value Date   ETH <5 02/29/2016   ETH <5 44/92/0100    Metabolic Disorder Labs: Lab Results  Component Value Date   HGBA1C 5.4 03/07/2016   MPG 108 03/07/2016   MPG 117 07/27/2015   Lab Results  Component Value Date   PROLACTIN 20.7 07/27/2015   Lab Results  Component Value Date   CHOL 165 03/07/2016   TRIG 67 03/07/2016   HDL 48 03/07/2016   CHOLHDL 3.4 03/07/2016   VLDL 13 03/07/2016   LDLCALC 104 (H) 03/07/2016   [place] 95 07/27/2015    Physical  Findings: AIMS:  , ,  ,  ,    CIWA:    COWS:  COWS Total Score: 1  Musculoskeletal: Strength & Muscle Tone: within normal limits Gait & Station: normal Patient leans: N/A  Psychiatric Specialty Exam: Physical Exam  Nursing note and vitals reviewed. Constitutional: She is oriented to person, place, and time. She appears well-nourished.  HENT:  Head: Normocephalic and atraumatic.  Eyes: Conjunctivae and EOM are normal.  Neck: Normal range of motion.  Respiratory: Effort normal.  Musculoskeletal: Normal range of motion.  Neurological: She is alert and oriented to person, place, and time.    Review of Systems  Constitutional: Negative.   HENT: Negative.   Eyes: Negative.   Respiratory: Negative.   Cardiovascular: Negative.   Gastrointestinal: Negative.   Genitourinary: Negative.   Musculoskeletal: Negative.   Skin: Negative.   Neurological: Negative.   Endo/Heme/Allergies: Negative.   Psychiatric/Behavioral: Positive for hallucinations. The patient has insomnia.   All other systems reviewed and are negative.   Blood pressure 126/80, pulse 91, temperature 98.8 F (37.1 C), resp. rate 18, height 5' 4" (1.626 m), weight 90.7 kg (200 lb), last menstrual period 02/27/2016, SpO2 98 %.Body mass index is 34.33 kg/m.  General Appearance: Disheveled  Eye Contact:  Minimal  Speech:  Normal Rate  Volume:  Normal  Mood:  less Irrtiablte   Affect:  Constricted  Thought Process:  Disorganized and Descriptions of Associations: Loose  Orientation:  Full (Time, Place, and Person)  Thought Content:  Delusions and hallucinations  Suicidal Thoughts:  No  Homicidal Thoughts:  No  Memory:  Immediate;   Poor Recent;   Poor Remote;   Poor  Judgement:  Impaired  Insight:  Lacking  Psychomotor Activity:  Decreased  Concentration:  Concentration: Poor and Attention Span: Poor  Recall:  Poor  Fund of Knowledge:  Poor  Language:  Fair  Akathisia:  No  Handed:    AIMS (if indicated):      Assets:  Communication Skills Leisure Time  ADL's:  Intact  Cognition:  Impaired,  Mild  Sleep:  Number of Hours: 5.45     Treatment Plan Summary:  No improvement since admission. Basically nonverbal. Withdrawn to her room minimal oral intake. Only eating one meal a day and Ativan in she is only eating about 50%. She has been drinking ensures per nursing staff.  Patient is a 41 year old Caucasian female with history of schizoaffective bipolar type. He presented to the emergency department due to aggression, agitation, and psychosis. Patient has not been compliant with medications.  For schizoaffective disorder: Lack of response to Abilify monotherapy. Lack of response to Abilify in combination with haloperidol 5 mg twice a day. This point in time haloperidol and Abilify will be discontinued.   Restarted Olanzapine on 1/22. Continue  olanzapine to 20 mg by mouth daily at bedtime.  Mouth checks have been ordered.  For catatonia:continue ativan 1 mg q am and 2 mg qhs  For agitation: continue Ativan 2 mg every 4 hours as needed  Patient require Manual Hold on 1/4 as she refused to leave the office where she was being interviewed on 1/4.   Ordered multivitamins  Diet regular  Vital signs daily--- stable vital signs today  Oral intake is now wnl.  Precautions every 15 minute checks  Hospitalization status continue involuntary commitment------ referral has been made to [place] recommending guardian she was mailed to the patient's mother on 1/11.   Labs: TSH, hemoglobin A1c and lipid panel, b12, BMP--has refused all labs---finally cooperative with labs on 1/10.  All wnl.  Basic metabolic panel from this morning was within the normal limits  Spoke with pt's mother again on 1/24. Paper work for [employer] has been completed she plans to take it to [employer] today.  We will touch base again on Monday at 1:30 pm.  Left message for mother  with pt security code, visitation and phone hours.  Hildred Priest,  MD 03/22/2016, 9:58 AM

## 2016-03-22 NOTE — [facility] Group Notes (Signed)
Goals Group Date/Time: 03/22/2016 9:00 AM Type of Therapy and Topic: Group Therapy: Goals Group: SMART Goals   Participation Level: Pt did not attend group.  Description of Group:    The purpose of a daily goals group is to assist and guide patients in setting recovery/wellness-related goals. The objective is to set goals as they relate to the crisis in which they were admitted. Patients will be using SMART goal modalities to set measurable goals. Characteristics of realistic goals will be discussed and patients will be assisted in setting and processing how one will reach their goal. Facilitator will also assist patients in applying interventions and coping skills learned in psycho-education groups to the SMART goal and process how one will achieve defined goal.   Therapeutic Goals:   -Patients will develop and document one goal related to or their crisis in which brought them into treatment.  -Patients will be guided by LCSW using SMART goal setting modality in how to set a measurable, attainable, realistic and time sensitive goal.  -Patients will process barriers in reaching goal.  -Patients will process interventions in how to overcome and successful in reaching goal.   Patient's Goal:   Therapeutic Modalities:  Motivational Interviewing  Cognitive Behavioral Therapy  Crisis Intervention Model  SMART goals setting   Jessica SquibbGreg Damica Gravlin, LCSW

## 2016-03-22 NOTE — Progress Notes (Signed)
Recreation Therapy Notes  Date: 01.25.18 Time: 9:30 am Location: Craft Room  Group Topic: Leisure Education  Goal Area(s) Addresses:  Patient will identify things they are grateful for. Patient will identify how being grateful can influenced decision making.  Behavioral Response: Did not attend  Intervention: Grateful Wheel  Activity: Patients were given an I Am Grateful For worksheet and were instructed to write things they are grateful for under each category.  Education: LRT educated patients on leisure.  Education Outcome: Patient did not attend group.   Clinical Observations/Feedback: Patient did not attend group.  Jacquelynn CreeGreene,Makeba Delcastillo M, LRT/CTRS 03/22/2016 10:01 AM

## 2016-03-22 NOTE — Progress Notes (Signed)
[facility] MD Progress Note  03/22/2016 3:14 PM Jessica [name]  MRN:  [id number] Subjective:    Patient is a 41 year old Caucasian female with history of schizoaffective disorder bipolar type. She was brought in by police to [facility] emergency department on January 3rd. Per the emergency departments note patient's mother called EMS after the patient grabbed her arm and wouldn't let go. Upon EMS arrival the patient was standing aggressively in the doorway and saying "[name], [name]. There is too much evil in [place]". She was uncooperative with evaluation yelling occasionally. Nurses in the ER described her as agitated and refusing to dressed out in his scrubs tangential.  It has been reported patient was noncompliant with her medications. Per chart review she was hospitalized twice last year in behavioral health [place]. Her last admission was in June 2017. She was discharged with a diagnosis of schizoaffective disorder bipolar type. During her stay in the hospital she was placed on known emergency forced medications.    1/22 Patient irritable, refuses to speak with me. Claims that she has lice and is asking for clippers to shave her head. Per nursing staff there is no evidence of days. Vision has been uncooperative. Despite taking Abilify patient has not improved. She has been during the hospital for 18 days with minimal improvement.  During the early part of her hospitalization she had responded well to olanzapine. She requested to have this medication change because she felt Abilify at work even better for her in the past. This was confirmed by her mother. Here she has been receiving Abilify 30 mg for several weeks with no response. At this point Abilify needs to be discontinued. I will retry her on olanzapine.   1/23 patient was less irritable and somewhat cooperative with assessment today. She continues to be delusional and paranoid. She sat and spoke with me. Patient says that her  mother has been hospitalized and she would like to be discharged in order to help her mother. She also reports today she had visitors from her undergrad (there has been no visitation time today, nobody has come to visit the patient today). She reported having some concern about patient's having intercourse, she complained about the "pimping going on here at the hospital" and having to perform sexual favors in return for things.   He we are doing something illegal here at the hospital giving her medications that are illegal.  She continues to report having auditory and visual hallucinations but mainly occur at nighttime.  She hears multiple voices of people talking. As far as side effects she complains of feeling that medication is causing blurry vision and makes her feel "out of it".   Patient reports feeling better. She denies problems with her mood and denies suicidality. She is actually requesting to be discharged soon.  Ate 50% breakfast and 100% of lunch.  Took meds yesterday and today. Not attending any programming.  1/24 Jessica [name] was seen in treatment team today for the first time in 20 days. She participated and asked questions appropriately.  She continued very disheveled. She reported feeling a little overly sedated with the medications. But did not voice any other problems or concerns. She denied having major problems with mood. Feels that her appetite has been improving. Continues to have auditory hallucinations at night. She denies having any suicidality or homicidality. No delusional thinking was noted.  100% compliant with medications. Appears that oral intake has improved, patient had 3 meals yesterday. Ate 50% of 2 meals and  100% of another one. Patient was cooperative with labs this morning. Patient continues to refuse  to participate in programming.  1/25 Met with Patient this morning and she seemed very pleasant an cooperative.Jessica [name] states that she feels somewhat rested  although she had trouble getting to sleep due to restlessness and agitation which she feels is a result of the [excessive] dosages of her medications.  She explained that she is hyper sensitive to such medications.  The patient describes her mood as anxious and frustrated about being in the hospital for such an extended period of time.  She rates her anxiety as a 7 or 8 out of ten.   She reports low depressive symptoms and says her appetite is slowly improving.  She reports going to group sessions although the chart notes from staff contradict that.  When asked if she will continue she states she knows she has to jump through that hoop in order to expedite discharge.  Jessica [name] denies suicidal ideations and harm to self or others.    The patient reports both auditory and visual hallucinations through out the day with auditory symptoms increasing at nighttime.  The visual hallucinations occur every other day and recurrent images are of volcanos, earthquakes, and [place] (the walls of [place]).  These images are stressful to her and "scare" her.  She reports that these images have been present since childhood, but occurred at night as opposed to recently when they occur during the daytime.   She is compliant with meds even though she feels the dosages are excessive and inappropriate.  She denies any physical complaints.  Per Nursing: Pt awake, alert, oriented and up on unit today. Improvement noted in pt mood, hygiene and eye contact. Pt initiates brief eye contact, holds her head up when speaking to this nurse. Pt did attend group, but it is reported that she left early. Slight improvement in hygiene. Pt is pleasant and cooperative. Medication compliant. Reports fair sleep last night without sleep medication, poor appetite, low energy, poor concentration. Rates depression 2/10, hopelessness 0/10, anxiety 8/10 (low 0-10 high). Reports goal today is "be kind with other people including the staff in this  hospital" by "grateful and mindful." Pt much more cooperative, pleasant. Improvement noted in mood and interactions.  Support and encouragement provided with therapeutic communication. Medications administered as ordered with education. Safety maintained with every 15 minute checks. Will continue to monitor.    Principal Problem: Schizoaffective disorder, bipolar type ([place]) Diagnosis:   Patient Active Problem List   Diagnosis Date Noted  . IBS (irritable bowel syndrome) [K58.9] 03/21/2016  . Schizoaffective disorder, bipolar type ([place]) [F25.0] 07/25/2015   Total Time spent with patient: 30 minutes  Past Psychiatric History:  Patient has been hospitalized several times at behavioral health in [place]. She was just discharged in July 2017.  No information regarding prior suicidal attempts or self-injurious behaviors  Past Medical History:  Past Medical History:  Diagnosis Date  . Anxiety   . Bipolar 1 disorder ([place])   . Depression   . Schizophrenia ([place])    History reviewed. No pertinent surgical history. Family History:  Family History  Problem Relation Age of Onset  . Hypertension Mother   . Mental illness Father    Family Psychiatric  History: Patient is unwilling to disclose information regarding this subject.  Social History:  History  Alcohol Use No     History  Drug Use No    Social History   Social History  . Marital  status: Single    Spouse name: N/A  . Number of children: N/A  . Years of education: N/A   Social History Main Topics  . Smoking status: Never Smoker  . Smokeless tobacco: Never Used  . Alcohol use No  . Drug use: No  . Sexual activity: No   Other Topics Concern  . None   Social History Narrative  . None   Additional Social History:    Pain Medications: none Prescriptions: none Over the Counter: none History of alcohol / drug use?: No history of alcohol / drug abuse Negative Consequences of Use: Personal relationships,  Financial, Work / School Withdrawal Symptoms: Other (Comment)                    Sleep: Poor  Appetite:  Fair  Current Medications: Current Facility-Administered Medications  Medication Dose Route Frequency Provider Last Rate Last Dose  . acetaminophen (TYLENOL) tablet 650 mg  650 mg Oral Q6H PRN Hildred Priest, MD      . alum & mag hydroxide-simeth (MAALOX/MYLANTA) 200-200-20 MG/5ML suspension 30 mL  30 mL Oral Q4H PRN Hildred Priest, MD      . feeding supplement (ENSURE ENLIVE) (ENSURE ENLIVE) liquid 237 mL  237 mL Oral BID BM Hildred Priest, MD      . LORazepam (ATIVAN) tablet 1 mg  1 mg Oral Daily Hildred Priest, MD   1 mg at 03/22/16 0804  . LORazepam (ATIVAN) tablet 2 mg  2 mg Oral QHS Hildred Priest, MD      . magnesium hydroxide (MILK OF MAGNESIA) suspension 30 mL  30 mL Oral Daily PRN Hildred Priest, MD   30 mL at 03/21/16 1715  . multivitamin with minerals tablet 1 tablet  1 tablet Oral Daily Hildred Priest, MD   1 tablet at 03/21/16 0801  . OLANZapine (ZYPREXA) tablet 20 mg  20 mg Oral QHS Hildred Priest, MD      . Oxcarbazepine (TRILEPTAL) tablet 300 mg  300 mg Oral BID Hildred Priest, MD   300 mg at 03/22/16 0804    Lab Results:  Results for orders placed or performed during the hospital encounter of 03/01/16 (from the past 48 hour(s))  Basic metabolic panel     Status: Abnormal   Collection Time: 03/21/16  7:09 AM  Result Value Ref Range   Sodium 141 135 - 145 mmol/L   Potassium 3.5 3.5 - 5.1 mmol/L   Chloride 105 101 - 111 mmol/L   CO2 26 22 - 32 mmol/L   Glucose, Bld 99 65 - 99 mg/dL   BUN 14 6 - 20 mg/dL   Creatinine, Ser 0.67 0.44 - 1.00 mg/dL   Calcium 8.6 (L) 8.9 - 10.3 mg/dL   GFR calc non Af Amer >60 >60 mL/min   GFR calc Af Amer >60 >60 mL/min    Comment: (NOTE) The eGFR has been calculated using the CKD EPI equation. This calculation has not been  validated in all clinical situations. eGFR's persistently <60 mL/min signify possible Chronic Kidney Disease.    Anion gap 10 5 - 15    Blood Alcohol level:  Lab Results  Component Value Date   [facility] <5 02/29/2016   ETH <5 53/66/4403    Metabolic Disorder Labs: Lab Results  Component Value Date   HGBA1C 5.4 03/07/2016   MPG 108 03/07/2016   MPG 117 07/27/2015   Lab Results  Component Value Date   PROLACTIN 20.7 07/27/2015   Lab Results  Component  Value Date   CHOL 165 03/07/2016   TRIG 67 03/07/2016   HDL 48 03/07/2016   CHOLHDL 3.4 03/07/2016   VLDL 13 03/07/2016   LDLCALC 104 (H) 03/07/2016   LDLCALC 95 07/27/2015    Physical Findings: AIMS:  , ,  ,  ,    CIWA:    COWS:  COWS Total Score: 1  Musculoskeletal: Strength & Muscle Tone: within normal limits Gait & Station: normal Patient leans: N/A  Psychiatric Specialty Exam: Physical Exam  Vitals reviewed. Constitutional: She is oriented to person, place, and time. She appears well-developed and well-nourished.  HENT:  Head: Normocephalic and atraumatic.  Eyes: Conjunctivae and EOM are normal.  Neck: Normal range of motion.  Respiratory: Effort normal.  Neurological: She is alert and oriented to person, place, and time.  Psychiatric: Her speech is normal. Judgment normal. Her mood appears anxious. Cognition and memory are normal.    Review of Systems  Constitutional: Negative.   Respiratory: Negative.   Gastrointestinal: Negative.   Musculoskeletal: Negative.   Skin: Negative.   Neurological: Negative.   Psychiatric/Behavioral: Positive for hallucinations. The patient is nervous/anxious and has insomnia.     Blood pressure 126/80, pulse 91, temperature 98.8 F (37.1 C), resp. rate 18, height 5' 4"  (1.626 m), weight 90.7 kg (200 lb), last menstrual period 02/27/2016, SpO2 98 %.Body mass index is 34.33 kg/m.  General Appearance: Fairly Groomed  Eye Contact:  Minimal  Speech:  Normal Rate  Volume:   Normal  Mood:  Anxious  Affect:  Blunt and Congruent  Thought Process:  Disorganized  Orientation:  Full (Time, Place, and Person)  Thought Content:  Logical  Suicidal Thoughts:  No  Homicidal Thoughts:  No  Memory:  Negative  Judgement:  Good  Insight:  NA  Psychomotor Activity:  Normal  Concentration:  Concentration: Poor  Recall:  Good  Fund of Knowledge:  Good  Language:  Good  Akathisia:  No  Handed:    AIMS (if indicated):     Assets:    ADL's:  Intact  Cognition:  WNL  Sleep:  Number of Hours: 5.45   Treatment Plan Summary: No improvement since admission. Basically nonverbal. Withdrawn to her room minimal oral intake. Only eating one meal a day and Ativan in she is only eating about 50%. She has been drinking ensures per nursing staff.  Patient is a 41 year old Caucasian female with history of schizoaffective bipolar type. He presented to the emergency department due to aggression, agitation, and psychosis. Patient has not been compliant with medications.  For schizoaffective disorder: Lack of response to Abilify monotherapy. Lack of response to Abilify in combination with haloperidol 5 mg twice a day. This point in time haloperidol and Abilify will be discontinued.   Restarted Olanzapine on 1/22. Continue  olanzapine to 20 mg by mouth daily at bedtime.  Mouth checks have been ordered.  For catatonia:continue ativan 1 mg q am and 2 mg qhs  For agitation: continue Ativan 2 mg every 4 hours as needed  Patient require Manual Hold on 1/4 as she refused to leave the office where she was being interviewed on 1/4.   Ordered multivitamins  Diet: regular  Vital signs: continue daily--- stable vital signs today  Oral intake is now wnl.  Continue to encourage eating at meals and taking ensure  Precautions every 15 minute checks  Hospitalization status:  continue involuntary commitment------ referral has been made to [facility]  recommending guardian was mailed  to the patient's mother on 1/11.   Labs: TSH, hemoglobin A1c and lipid panel, b12, BMP--has refused all labs---finally cooperative with labs on 1/10.  All wnl.  Basic metabolic panel from this morning was within the normal limits  Spoke with pt's mother again on 1/24. Paper work for [employer] has been completed she plans to take it to [employer] today.  We will touch base again on Monday at 1:30 pm.  Left message for mother with pt security code, visitation and phone hours.  Annia Friendly, Student-PA 03/22/2016, 3:14 PM

## 2016-03-22 NOTE — Progress Notes (Signed)
Pt awake, alert, oriented and up on unit today. Improvement noted in pt mood, hygiene and eye contact. Pt initiates brief eye contact, holds her head up when speaking to this nurse. Pt did attend group, but it is reported that she left early. Slight improvement in hygiene. Pt is pleasant and cooperative. Medication compliant. Reports fair sleep last night without sleep medication, poor appetite, low energy, poor concentration. Rates depression 2/10, hopelessness 0/10, anxiety 8/10 (low 0-10 high). Reports goal today is "be kind with other people including the staff in this hospital" by "grateful and mindful."   Support and encouragement provided with therapeutic communication. Medications administered as ordered with education. Safety maintained with every 15 minute checks. Will continue to monitor.

## 2016-03-23 MED ORDER — OLANZAPINE 10 MG PO TABS
30.0000 mg | ORAL_TABLET | Freq: Every day | ORAL | Status: DC
  Administered 2016-03-23 – 2016-04-04 (×12): 30 mg via ORAL
  Filled 2016-03-23 (×13): qty 3

## 2016-03-23 NOTE — [facility] Group Notes (Signed)
[facility] Group Notes:  (Nursing/MHT/Case Management/Adjunct)  Date:  03/23/2016  Time:  5:46 AM  Type of Therapy:  Psychoeducational Skills  Participation Level:  Did Not Attend  Summary of Progress/Problems:  Chancy MilroyLaquanda Y Katiana Ruland 03/23/2016, 5:46 AM

## 2016-03-23 NOTE — Progress Notes (Signed)
[facility] MD Progress Note  03/23/2016 12:30 PM Jessica [name]  MRN:  [id number] Subjective:  Patient is a 41 year old Caucasian female with history of schizoaffective disorder bipolar type. She was brought in by police to [facility] emergency department on January 3rd. Per the emergency departments note patient's mother called EMS after the patient grabbed her arm and wouldn't let go. Upon EMS arrival the patient was standing aggressively in the doorway and saying "[place], [place]. There is too much evil in [place]". She was uncooperative with evaluation yelling occasionally. Nurses in the ER described her as agitated and refusing to dressed out in his scrubs tangential.  It has been reported patient was noncompliant with her medications.  Per chart review she was hospitalized twice last year in behavioral health [place]. Her last admission was in June 2017. She was discharged with a diagnosis of schizoaffective disorder bipolar type. During her stay in the hospital she was placed on known emergency forced medications.    1/22 Patient irritable, refuses to speak with me. Claims that she has lice and is asking for clippers to shave her head. Per nursing staff there is no evidence of days. Vision has been uncooperative. Despite taking Abilify patient has not improved. She has been during the hospital for 18 days with minimal improvement.  During the early part of her hospitalization she had responded well to olanzapine. She requested to have this medication change because she felt Abilify at work even better for her in the past. This was confirmed by her mother. Here she has been receiving Abilify 30 mg for several weeks with no response. At this point Abilify needs to be discontinued. I will retry her on olanzapine.   1/23 patient was less irritable and somewhat cooperative with assessment today. She continues to be delusional and paranoid. She sat and spoke with me. Patient says that her mother has  been hospitalized and she would like to be discharged in order to help her mother. She also reports today she had visitors from her undergrad (there has been no visitation time today, nobody has come to visit the patient today). She reported having some concern about patient's having intercourse, she complained about the "pimping going on here at the hospital" and having to perform sexual favors in return for things.   He we are doing something illegal here at the hospital giving her medications that are illegal.  She continues to report having auditory and visual hallucinations but mainly occur at nighttime.  She hears multiple voices of people talking. As far as side effects she complains of feeling that medication is causing blurry vision and makes her feel "out of it".   Patient reports feeling better. She denies problems with her mood and denies suicidality. She is actually requesting to be discharged soon.  Ate 50% breakfast and 100% of lunch.  Took meds yesterday and today. Not attending any programming.  1/24 Jessica [name] was seen in treatment team today for the first time in 20 days. She participated and asked questions appropriately.  She continued very disheveled. She reported feeling a little overly sedated with the medications. But did not voice any other problems or concerns. She denied having major problems with mood. Feels that her appetite has been improving. Continues to have auditory hallucinations at night. She denies having any suicidality or homicidality. No delusional thinking was noted.   100% compliant with medications. Appears that oral intake has improved, patient had 3 meals yesterday. Ate 50% of 2 meals and  100% of another one. Patient was cooperative with labs this morning. Patient continues to refuse  to participate in programming.  1/25 patient sat up in bed and spoke with this Clinical research associatewriter. She was pleasant and cooperative. She did not voice any delusions today. Patient has  reported improvement in mood and appetite. She says that the hallucinations continue to occur but they're "quick and blurry". She is tolerating medications well her only concern is some sedation however with the recent changes made she feels less tired. She denies major issues with mood, she denies suicidality, homicidality. She denies any physical complaints.  She was ok today to have us contact her mother and provide her with the security code.   100% compliant with medications. Appears that oral intake has improved since Monday, patient had 3 meals yesterday. Ate 50% of 2 meals and 100% of another one.   1/26 patient did not appear as cooperative as she has been over the last 2 days. She was again, suspicious. Very hesitant about answering questions. Saying she rather not answer certain questions.  Appears last night she was hesitant about taking her medications but eventually did. Patient states in her room most of the day. Yesterday she stated in a group for a few minutes. That is the only group she has participated in that I can see. Patient has started to eat more however yesterday her oral intake was little less that over the last 2 days. Continues to worry that were trying to overmedicate her and that were doing illegal things (using illegal drugs)   Per nursing: Observed in her room sitting up in bed, mood appeared to better until I discussed up coming medications at bedtime; protested "I have already got all my medications and what medications am I supposed to get at bedtime? Patient followed me to the medication room, scanned her wrist bands, pull up her profile and discussed Ativan 2 mg and Zyprexa 20 mg due at bedtime. "I am going to contact my lawyer if you overdose me, I am going to talk to Dr. Ardyth HarpsHernandez, it seems that you guys need the bed for someone else ..." Medications given as scheduled.  Principal Problem: Schizoaffective disorder, bipolar type (HCC) Diagnosis:   Patient Active  Problem List   Diagnosis Date Noted  . IBS (irritable bowel syndrome) [K58.9] 03/21/2016  . Schizoaffective disorder, bipolar type (HCC) [F25.0] 07/25/2015   Total Time spent with patient: 30 minutes  Past Psychiatric History: Patient has been hospitalized several times at behavioral health in [place]. She was just discharged in July 2017.  I don't have any information regarding prior suicidal attempts or self-injurious behaviors   Past Medical History:  Past Medical History:  Diagnosis Date  . Anxiety   . Bipolar 1 disorder (HCC)   . Depression   . Schizophrenia (HCC)    History reviewed. No pertinent surgical history.  Family History:  Family History  Problem Relation Age of Onset  . Hypertension Mother   . Mental illness Father    Family Psychiatric  History: Unknown at this point as patient is uncooperative   Social History: Not much information is available at this time other than she lives with her mother in [place]. 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 Marital status: Single    Spouse name: N/A  .  Number of children: N/A  . Years of education: N/A   Social History Main Topics  . Smoking status: Never Smoker  . Smokeless tobacco: Never Used  . Alcohol use No  . Drug use: No  . Sexual activity: No   Other Topics Concern  . None   Social History Narrative  . None   Additional Social History:    Pain Medications: none Prescriptions: none Over the Counter: none History of alcohol / drug use?: No history of alcohol / drug abuse Negative Consequences of Use: Personal relationships, Financial, Work / School Withdrawal Symptoms: Other (Comment)      Current Medications: Current Facility-Administered Medications  Medication Dose Route Frequency Provider Last Rate Last Dose  . acetaminophen (TYLENOL) tablet 650 mg   650 mg Oral Q6H PRN Jimmy Footman, MD      . alum & mag hydroxide-simeth (MAALOX/MYLANTA) 200-200-20 MG/5ML suspension 30 mL  30 mL Oral Q4H PRN Jimmy Footman, MD      . feeding supplement (ENSURE ENLIVE) (ENSURE ENLIVE) liquid 237 mL  237 mL Oral BID BM Jimmy Footman, MD   237 mL at 03/23/16 0915  . LORazepam (ATIVAN) tablet 1 mg  1 mg Oral Daily Jimmy Footman, MD   1 mg at 03/23/16 1610  . LORazepam (ATIVAN) tablet 2 mg  2 mg Oral QHS Jimmy Footman, MD   2 mg at 03/22/16 2020  . magnesium hydroxide (MILK OF MAGNESIA) suspension 30 mL  30 mL Oral Daily PRN Jimmy Footman, MD   30 mL at 03/21/16 1715  . multivitamin with minerals tablet 1 tablet  1 tablet Oral Daily Jimmy Footman, MD   1 tablet at 03/23/16 [phone]  . OLANZapine (ZYPREXA) tablet 30 mg  30 mg Oral QHS Jimmy Footman, MD      . Oxcarbazepine (TRILEPTAL) tablet 300 mg  300 mg Oral BID Jimmy Footman, MD   300 mg at 03/23/16 5409    Lab Results:  No results found for this or any previous visit (from the past 48 hour(s)).  Blood Alcohol level:  Lab Results  Component Value Date   ETH <5 02/29/2016   ETH <5 08/03/2015    Metabolic Disorder Labs: Lab Results  Component Value Date   HGBA1C 5.4 03/07/2016   MPG 108 03/07/2016   MPG 117 07/27/2015   Lab Results  Component Value Date   PROLACTIN 20.7 07/27/2015   Lab Results  Component Value Date   CHOL 165 03/07/2016   TRIG 67 03/07/2016   HDL 48 03/07/2016   CHOLHDL 3.4 03/07/2016   VLDL 13 03/07/2016   LDLCALC 104 (H) 03/07/2016   LDLCALC 95 07/27/2015    Physical Findings: AIMS:  , ,  ,  ,    CIWA:    COWS:  COWS Total Score: 1  Musculoskeletal: Strength & Muscle Tone: within normal limits Gait & Station: normal Patient leans: N/A  Psychiatric Specialty Exam: Physical Exam  Nursing note and vitals reviewed. Constitutional: She is oriented to person,  place, and time. She appears well-nourished.  HENT:  Head: Normocephalic and atraumatic.  Eyes: Conjunctivae and EOM are normal.  Neck: Normal range of motion.  Respiratory: Effort normal.  Musculoskeletal: Normal range of motion.  Neurological: She is alert and oriented to person, place, and time.    Review of Systems  Constitutional: Negative.   HENT: Negative.   Eyes: Negative.   Respiratory: Negative.   Cardiovascular: Negative.   Gastrointestinal: Negative.   Genitourinary: Negative.   Musculoskeletal:  Negative.   Skin: Negative.   Neurological: Negative.   Endo/Heme/Allergies: Negative.   Psychiatric/Behavioral: Positive for hallucinations. The patient has insomnia.   All other systems reviewed and are negative.   Blood pressure 125/76, pulse 89, temperature 98.3 F (36.8 C), temperature source Oral, resp. rate 20, height 5\' 4"  (1.626 m), weight 90.7 kg (200 lb), last menstrual period 02/27/2016, SpO2 98 %.Body mass index is 34.33 kg/m.  General Appearance: Disheveled  Eye Contact:  Minimal  Speech:  Normal Rate  Volume:  Normal  Mood:  less Irrtiablte   Affect:  Constricted  Thought Process:  Disorganized and Descriptions of Associations: Loose  Orientation:  Full (Time, Place, and Person)  Thought Content:  Delusions and hallucinations  Suicidal Thoughts:  No  Homicidal Thoughts:  No  Memory:  Immediate;   Poor Recent;   Poor Remote;   Poor  Judgement:  Impaired  Insight:  Lacking  Psychomotor Activity:  Decreased  Concentration:  Concentration: Poor and Attention Span: Poor  Recall:  Poor  Fund of Knowledge:  Poor  Language:  Fair  Akathisia:  No  Handed:    AIMS (if indicated):     Assets:  Communication Skills Leisure Time  ADL's:  Intact  Cognition:  Impaired,  Mild  Sleep:  Number of Hours: 7.15     Treatment Plan Summary:  Limited improvement since admission. Basically nonverbal. Withdrawn to her room minimal oral intake. Only eating one meal  a day and Ativan in she is only eating about 50%. She has been drinking ensures per nursing staff.  Patient is a 40 year old Caucasian female with history of schizoaffective bipolar type. He presented to the emergency department due to aggression, agitation, and psychosis. Patient has not been compliant with medications.  For schizoaffective disorder: Lack of response to Abilify monotherapy. Lack of response to Abilify in combination with haloperidol 5 mg twice a day. This point in time haloperidol and Abilify will be discontinued.   Restarted Olanzapine on 1/22. Continue  Olanzapine but will increase dose to 30 mg at bedtime..  Mouth checks have been ordered.  Mood stabilization: Patient has been taking Trileptal 300 mg a day. She prefers this medication over any other mood stabilizers  For catatonia:continue ativan 1 mg q am and 2 mg qhs  For agitation: continue Ativan 2 mg every 4 hours as needed  Patient require Manual Hold on 1/4 as she refused to leave the office where she was being interviewed on 1/4.   Ordered multivitamins  Diet regular  Vital signs daily--- stable vital signs today  Oral intake is fair  Precautions every 15 minute checks  Hospitalization status continue involuntary commitment------ referral has been made to [facility].  Letter recommending guardian she was mailed to the patient's mother on 1/11.   Labs: TSH, hemoglobin A1c and lipid panel, b12, BMP--has refused all labs---finally cooperative with labs on 1/10.  All wnl.  Basic metabolic panel from this morning was within the normal limits  Spoke with pt's mother again on 1/24. Paper work for [employer] has been completed she plans to take it to [employer] today.  We will touch base again on Monday at 1:30 pm.  Left message for mother with pt security code, visitation and phone hours on 1/26  Jimmy Footman, MD 03/23/2016, 12:30 PM

## 2016-03-23 NOTE — Progress Notes (Signed)
2200: Patient stayed in bed sleeping. Presented to the medication room to receive her bedtime medications. Mood is sad, affect is flat. Bizarre, isolative, avoiding conversations with staff. Returned to bed after having her medications. Stated "IOh I am OK, everything is alright". Brief and rapid when talking. Was encouraged to express her thoughts and feeling as needed. Safety precautions reinforced.

## 2016-03-23 NOTE — [facility] Group Notes (Signed)
[facility] LCSW Group Therapy Note  Date/Time: 03/23/16 1300  Type of Therapy and Topic:  Group Therapy:  Feelings around Relapse and Recovery  Participation Level:  Did Not Attend (remains to same)  Mood:  Description of Group:    Patients in this group will discuss emotions they experience before and after a relapse. They will process how experiencing these feelings, or avoidance of experiencing them, relates to having a relapse. Facilitator will guide patients to explore emotions they have related to recovery. Patients will be encouraged to process which emotions are more powerful. They will be guided to discuss the emotional reaction significant others in their lives may have to patients' relapse or recovery. Patients will be assisted in exploring ways to respond to the emotions of others without this contributing to a relapse.  Therapeutic Goals: 1. Patient will identify two or more emotions that lead to relapse for them:  2. Patient will identify two emotions that result when they relapse:  3. Patient will identify two emotions related to recovery:  4. Patient will demonstrate ability to communicate their needs through discussion and/or role plays.   Summary of Patient Progress:        Therapeutic Modalities:   Cognitive Behavioral Therapy Solution-Focused Therapy Assertiveness Training Relapse Prevention Therapy    Greg Eyden Dobie, LCSW   

## 2016-03-23 NOTE — Progress Notes (Signed)
Patient alert, but states she does not want to talk, she slept most of day, but did walk the hallway a couple of times, Patient encouraged to take shower, but she refused. Patient is safe, q 15 minute checks.

## 2016-03-23 NOTE — Progress Notes (Signed)
Recreation Therapy Notes  Date: 01.26.18 Time: 9:30 am Location: Craft Room  Group Topic: Coping Skills  Goal Area(s) Addresses:  Patient will participate in healthy coping skill. Patient will verbalize benefit of using art as a coping skill.  Behavioral Response: Did not attend  Intervention: Coloring  Activity: Patients were given coloring sheets and were instructed to think about the emotions they were feeling and what their minds were focused on.  Education: LRT educated patients on healthy coping skills.  Education Outcome: Patient did not attend group.   Clinical Observations/Feedback: Patient did not attend group.  Jacquelynn CreeGreene,Pearla Mckinny M, LRT/CTRS 03/23/2016 10:24 AM

## 2016-03-23 NOTE — Tx Team (Signed)
Interdisciplinary Treatment and Diagnostic Plan Update  03/23/2016 Time of Session: 10:30am Jessica ChaletJennifer L [name] MRN: [id number]  Principal Diagnosis: Schizoaffective disorder, bipolar type [facility](HCC)  Secondary Diagnoses: Principal Problem:   Schizoaffective disorder, bipolar type (HCC) Active Problems:   IBS (irritable bowel syndrome)   Current Medications:  Current Facility-Administered Medications  Medication Dose Route Frequency Provider Last Rate Last Dose  . acetaminophen (TYLENOL) tablet 650 mg  650 mg Oral Q6H PRN Jimmy FootmanAndrea Hernandez-Gonzalez, MD      . alum & mag hydroxide-simeth (MAALOX/MYLANTA) 200-200-20 MG/5ML suspension 30 mL  30 mL Oral Q4H PRN Jimmy FootmanAndrea Hernandez-Gonzalez, MD      . feeding supplement (ENSURE ENLIVE) (ENSURE ENLIVE) liquid 237 mL  237 mL Oral BID BM Jimmy FootmanAndrea Hernandez-Gonzalez, MD   237 mL at 03/23/16 0915  . LORazepam (ATIVAN) tablet 1 mg  1 mg Oral Daily Jimmy FootmanAndrea Hernandez-Gonzalez, MD   1 mg at 03/23/16 [id number]  . LORazepam (ATIVAN) tablet 2 mg  2 mg Oral QHS Jimmy FootmanAndrea Hernandez-Gonzalez, MD   2 mg at 03/22/16 2020  . magnesium hydroxide (MILK OF MAGNESIA) suspension 30 mL  30 mL Oral Daily PRN Jimmy FootmanAndrea Hernandez-Gonzalez, MD   30 mL at 03/21/16 1715  . multivitamin with minerals tablet 1 tablet  1 tablet Oral Daily Jimmy FootmanAndrea Hernandez-Gonzalez, MD   1 tablet at 03/23/16 [id number]  . OLANZapine (ZYPREXA) tablet 30 mg  30 mg Oral QHS Jimmy FootmanAndrea Hernandez-Gonzalez, MD      . Oxcarbazepine (TRILEPTAL) tablet 300 mg  300 mg Oral BID Jimmy FootmanAndrea Hernandez-Gonzalez, MD   300 mg at 03/23/16 [id number]   PTA Medications: Prescriptions Prior to Admission  Medication Sig Dispense Refill Last Dose  . ARIPiprazole (ABILIFY) 5 MG tablet Take 5 mg by mouth daily.   02/29/2016 at Unknown time  . doxepin (SINEQUAN) 10 MG capsule Take 2 capsules (20 mg total) by mouth at bedtime as needed (sleep). 30 capsule 0 unknown  . hydrOXYzine (ATARAX/VISTARIL) 25 MG tablet Take 1 tablet (25 mg total) by mouth every 6 (six)  hours as needed for anxiety. 30 tablet 0 unknown  . OLANZapine (ZYPREXA) 2.5 MG tablet Take 5 tablets (12.5 mg total) by mouth at bedtime. (Patient not taking: Reported on 02/29/2016) 30 tablet 0 Not Taking at Unknown time  . OXcarbazepine (TRILEPTAL) 150 MG tablet Take 1 tablet (150 mg total) by mouth 2 (two) times daily. 60 tablet 0 02/29/2016 at Unknown time  . ziprasidone (GEODON) 20 MG capsule Take 1 capsule (20 mg total) by mouth 2 (two) times daily as needed (SEVERE ANXIETY/AGITATION). 30 capsule 0 unknown    Patient Stressors: Financial difficulties Marital or family conflict Medication change or noncompliance  Patient Strengths: Ability for Warden/rangerinsight Communication skills Motivation for treatment/growth  Treatment Modalities: Medication Management, Group therapy, Case management,  1 to 1 session with clinician, Psychoeducation, Recreational therapy.   Physician Treatment Plan for Primary Diagnosis: Schizoaffective disorder, bipolar type (HCC) Long Term Goal(s): Improvement in symptoms so as ready for discharge Improvement in symptoms so as ready for discharge   Short Term Goals: Ability to identify changes in lifestyle to reduce recurrence of condition will improve Ability to demonstrate self-control will improve Ability to identify and develop effective coping behaviors will improve Compliance with prescribed medications will improve Ability to identify triggers associated with substance abuse/mental health issues will improve Ability to identify changes in lifestyle to reduce recurrence of condition will improve Ability to verbalize feelings will improve Ability to demonstrate self-control will improve Compliance with prescribed medications will improve Ability  to identify triggers associated with substance abuse/mental health issues will improve  Medication Management: Evaluate patient's response, side effects, and tolerance of medication regimen.  Therapeutic Interventions: 1  to 1 sessions, Unit Group sessions and Medication administration.  Evaluation of Outcomes: Progressing  Physician Treatment Plan for Secondary Diagnosis: Principal Problem:   Schizoaffective disorder, bipolar type (HCC) Active Problems:   IBS (irritable bowel syndrome)  Long Term Goal(s): Improvement in symptoms so as ready for discharge Improvement in symptoms so as ready for discharge   Short Term Goals: Ability to identify changes in lifestyle to reduce recurrence of condition will improve Ability to demonstrate self-control will improve Ability to identify and develop effective coping behaviors will improve Compliance with prescribed medications will improve Ability to identify triggers associated with substance abuse/mental health issues will improve Ability to identify changes in lifestyle to reduce recurrence of condition will improve Ability to verbalize feelings will improve Ability to demonstrate self-control will improve Compliance with prescribed medications will improve Ability to identify triggers associated with substance abuse/mental health issues will improve     Medication Management: Evaluate patient's response, side effects, and tolerance of medication regimen.  Therapeutic Interventions: 1 to 1 sessions, Unit Group sessions and Medication administration.  Evaluation of Outcomes: Progressing   RN Treatment Plan for Primary Diagnosis: Schizoaffective disorder, bipolar type (HCC) Long Term Goal(s): Knowledge of disease and therapeutic regimen to maintain health will improve  Short Term Goals: Ability to remain free from injury will improve, Ability to demonstrate self-control, Ability to disclose and discuss suicidal ideas and Compliance with prescribed medications will improve  Medication Management: RN will administer medications as ordered by provider, will assess and evaluate patient's response and provide education to patient for prescribed medication. RN  will report any adverse and/or side effects to prescribing provider.  Therapeutic Interventions: 1 on 1 counseling sessions, Psychoeducation, Medication administration, Evaluate responses to treatment, Monitor vital signs and CBGs as ordered, Perform/monitor CIWA, COWS, AIMS and Fall Risk screenings as ordered, Perform wound care treatments as ordered.  Evaluation of Outcomes: Progressing   LCSW Treatment Plan for Primary Diagnosis: Schizoaffective disorder, bipolar type (HCC) Long Term Goal(s): Safe transition to appropriate next level of care at discharge, Engage patient in therapeutic group addressing interpersonal concerns.  Short Term Goals: Engage patient in aftercare planning with referrals and resources, Increase social support, Facilitate acceptance of mental health diagnosis and concerns and Increase skills for wellness and recovery  Therapeutic Interventions: Assess for all discharge needs, 1 to 1 time with Social worker, Explore available resources and support systems, Assess for adequacy in community support network, Educate family and significant other(s) on suicide prevention, Complete Psychosocial Assessment, Interpersonal group therapy.  Evaluation of Outcomes: Progressing   Progress in Treatment: Attending groups: No. Participating in groups: No. Taking medication as prescribed: Yes. Toleration medication: Yes. Family/Significant other contact made: No, will contact:  patient has not consented for family contact Patient understands diagnosis: Yes. Discussing patient identified problems/goals with staff: Yes. Medical problems stabilized or resolved: Yes. Denies suicidal/homicidal ideation: Yes. Issues/concerns per patient self-inventory: No. Other: n/a  New problem(s) identified: None identified at this time.   New Short Term/Long Term Goal(s): None identified at this time.   Discharge Plan or Barriers: Patient is on the wait list for [facility].    Reason for Continuation of Hospitalization: Delusions  Hallucinations Medication stabilization  Estimated Length of Stay: 7 days.   Attendees: Patient: Jessica [name] 03/23/2016 11:00 AM  Physician: Dr. Radene Journey,  MD 03/23/2016 11:00 AM  Nursing: Leonia Reader, BSN, RN 03/23/2016 11:00 AM  RN Care Manager: 03/23/2016 11:00 AM  Social Worker: Hampton Abbot, LCSWA 03/23/2016 11:00 AM  Recreational Therapist: Jacquelynn Cree, LRT/CTRS 03/23/2016 11:00 AM    Scribe for Treatment Team: Lynden Oxford, LCSWA 03/23/2016 11:00 AM

## 2016-03-23 NOTE — Plan of Care (Signed)
Problem: Activity: Goal: Sleeping patterns will improve Outcome: Progressing Patient slept for Estimated Hours of 7.15; every 15 minutes safety round maintained, no injury or falls during this shift.    

## 2016-03-23 NOTE — Progress Notes (Signed)
Patient ID: Jessica [name], female   DOB: 11/18/1975, 41 y.o.   MRN: [id number] Observed in her room sitting up in bed, mood appeared to better until I discussed up coming medications at bedtime; protested "I have already got all my medications and what medications am I supposed to get at bedtime? Patient followed me to the medication room, scanned her wrist bands, pull up her profile and discussed Ativan 2 mg and Zyprexa 20 mg due at bedtime. "I am going to contact my lawyer if you overdose me, I am going to talk to Dr. Ardyth HarpsHernandez, it seems that you guys need the bed for someone else ..." Medications given as scheduled.

## 2016-03-23 NOTE — Progress Notes (Signed)
[facility] MD Progress Note  03/23/2016 3:25 PM Jessica [name]  MRN:  [id number] Subjective:   Patient is a 41 year old Caucasian female with history of schizoaffective disorder bipolar type. She was brought in by police to [facility] emergency department on January 3rd. Per the emergency departments note patient's mother called EMS after the patient grabbed her arm and wouldn't let go. Upon EMS arrival the patient was standing aggressively in the doorway and saying "[name], [name]. There is too much evil in [place]". She was uncooperative with evaluation yelling occasionally. Nurses in the ER described her as agitated and refusing to dressed out in his scrubs tangential.  It has been reported patient was noncompliant with her medications. Per chart review she was hospitalized twice last year in behavioral health [place]. Her last admission was in June 2017. She was discharged with a diagnosis of schizoaffective disorder bipolar type. During her stay in the hospital she was placed on known emergency forced medications.   1/22 Patient irritable, refuses to speak with me. Claims that she has lice and is asking for clippers to shave her head. Per nursing staff there is no evidence of days. Vision has been uncooperative. Despite taking Abilify patient has not improved. She has been during the hospital for 18 days with minimal improvement.  During the early part of her hospitalization she had responded well to olanzapine. She requested to have this medication change because she felt Abilify at work even better for her in the past. This was confirmed by her mother. Here she has been receiving Abilify 30 mg for several weeks with no response. At this point Abilify needs to be discontinued. I will retry her on olanzapine.   1/23 patient was less irritable and somewhat cooperative with assessment today. She continues to be delusional and paranoid. She sat and spoke with me. Patient says that her mother  has been hospitalized and she would like to be discharged in order to help her mother. She also reports today she had visitors from her undergrad (there has been no visitation time today, nobody has come to visit the patient today). She reported having some concern about patient's having intercourse, she complained about the "pimping going on here at the hospital" and having to perform sexual favors in return for things.   He we are doing something illegal here at the hospital giving her medications that are illegal.  She continues to report having auditory and visual hallucinations but mainly occur at nighttime.  She hears multiple voices of people talking. As far as side effects she complains of feeling that medication is causing blurry vision and makes her feel "out of it".   Patient reports feeling better. She denies problems with her mood and denies suicidality. She is actually requesting to be discharged soon.  Ate 50% breakfast and 100% of lunch.  Took meds yesterday and today. Not attending any programming.  1/24 Angelica was seen in treatment team today for the first time in 20 days. She participated and asked questions appropriately.  She continued very disheveled. She reported feeling a little overly sedated with the medications. But did not voice any other problems or concerns. She denied having major problems with mood. Feels that her appetite has been improving. Continues to have auditory hallucinations at night. She denies having any suicidality or homicidality. No delusional thinking was noted.   100% compliant with medications. Appears that oral intake has improved, patient had 3 meals yesterday. Ate 50% of 2 meals and 100%  of another one. Patient was cooperative with labs this morning. Patient continues to refuse  to participate in programming.  1/25 patient sat up in bed and spoke with this Probation officer. She was pleasant and cooperative. She did not voice any delusions today.  Patient has reported improvement in mood and appetite. She says that the hallucinations continue to occur but they're "quick and blurry". She is tolerating medications well her only concern is some sedation however with the recent changes made she feels less tired. She denies major issues with mood, she denies suicidality, homicidality. She denies any physical complaints.  She was ok today to have [place] contact her mother and provide her with the security code.   100% compliant with medications.Appears that oral intake has improved since Monday, patient had 3 meals yesterday. Ate 50% of 2 meals and 100% of another one.   1/26  When I met with [name] this morning she was agreeable to sit and speak with me.  She reports that she did not sleep well and that she slept "on and off" . She reports improving appetite, last night she had "burger, fries, and sweet tea".   When I asked her if she had grown up in the [place] she replied that my question was "inappropriate".  [name] denies suicidal ideation, depressive symptoms, and any thoughts of harming self or others.    [name] states that she is still experiencing auditory hallucinations during the day and more at night.  She also reports visual hallucinations during the day of the "titanic,  [employer], and tsunami's.  She continued to talk about "Karalee Height"  who is responsible for the problems in her neighborhood".   [name] took her meds last night although she challenged the nurse and repeated the encounter to me that is described below.  She appears more more paranoid today.    She told me her back pain is better and most likely due to increased walking/activity, and denies any other physical complaints  Per Nursing: Observed in her room sitting up in bed, mood appeared to better until I discussed up coming medications at bedtime; protested "I have already got all my medications and what medications am I supposed to get at bedtime? Patient  followed me to the medication room, scanned her wrist bands, pull up her profile and discussed Ativan 2 mg and Zyprexa 20 mg due at bedtime. "I am going to contact my lawyer if you overdose me, I am going to talk to Dr. Jerilee Hoh, it seems that you guys need the bed for someone else ..." Medications given as scheduled.   Principal Problem: Schizoaffective disorder, bipolar type ([place]) Diagnosis:   Patient Active Problem List   Diagnosis Date Noted  . IBS (irritable bowel syndrome) [K58.9] 03/21/2016  . Schizoaffective disorder, bipolar type ([place]) [F25.0] 07/25/2015   Total Time spent with patient: 30 minutes  Past Psychiatric History:  Patient has been hospitalized several times at behavioral health in [place]. She was just discharged in July 2017.  I don't have any information regarding prior suicidal attempts or self-injurious behaviors.  Past Medical History:  Past Medical History:  Diagnosis Date  . Anxiety   . Bipolar 1 disorder ([place])   . Depression   . Schizophrenia ([place])    History reviewed. No pertinent surgical history. Family History:  Family History  Problem Relation Age of Onset  . Hypertension Mother   . Mental illness Father    Family Psychiatric  History:  Unknown at this point  as patient is uncooperative   Social History: Not much information is available at this time other than she lives with her mother in [place]. 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 Marital status: Single    Spouse name: N/A  . Number of children: N/A  . Years of education: N/A   Social History Main Topics  . Smoking status: Never Smoker  . Smokeless tobacco: Never Used  . Alcohol use No  . Drug use: No  . Sexual activity: No   Other Topics Concern  . None   Social History Narrative  . None   Additional Social History:    Pain  Medications: none Prescriptions: none Over the Counter: none History of alcohol / drug use?: No history of alcohol / drug abuse Negative Consequences of Use: Personal relationships, Financial, Work / School Withdrawal Symptoms: Other (Comment)                    Sleep: Poor  Appetite:  Fair  Current Medications: Current Facility-Administered Medications  Medication Dose Route Frequency Provider Last Rate Last Dose  . acetaminophen (TYLENOL) tablet 650 mg  650 mg Oral Q6H PRN Hildred Priest, MD      . alum & mag hydroxide-simeth (MAALOX/MYLANTA) 200-200-20 MG/5ML suspension 30 mL  30 mL Oral Q4H PRN Hildred Priest, MD      . feeding supplement (ENSURE ENLIVE) (ENSURE ENLIVE) liquid 237 mL  237 mL Oral BID BM Hildred Priest, MD   237 mL at 03/23/16 0915  . LORazepam (ATIVAN) tablet 1 mg  1 mg Oral Daily Hildred Priest, MD   1 mg at 03/23/16 3244  . LORazepam (ATIVAN) tablet 2 mg  2 mg Oral QHS Hildred Priest, MD   2 mg at 03/22/16 2020  . magnesium hydroxide (MILK OF MAGNESIA) suspension 30 mL  30 mL Oral Daily PRN Hildred Priest, MD   30 mL at 03/21/16 1715  . multivitamin with minerals tablet 1 tablet  1 tablet Oral Daily Hildred Priest, MD   1 tablet at 03/23/16 [phone]  . OLANZapine (ZYPREXA) tablet 30 mg  30 mg Oral QHS Hildred Priest, MD      . Oxcarbazepine (TRILEPTAL) tablet 300 mg  300 mg Oral BID Hildred Priest, MD   300 mg at 03/23/16 7253    Lab Results: No results found for this or any previous visit (from the past 48 hour(s)).  Blood Alcohol level:  Lab Results  Component Value Date   ETH <5 02/29/2016   ETH <5 66/44/0347    Metabolic Disorder Labs: Lab Results  Component Value Date   HGBA1C 5.4 03/07/2016   MPG 108 03/07/2016   MPG 117 07/27/2015   Lab Results  Component Value Date   PROLACTIN 20.7 07/27/2015   Lab Results  Component Value Date   CHOL  165 03/07/2016   TRIG 67 03/07/2016   HDL 48 03/07/2016   CHOLHDL 3.4 03/07/2016   VLDL 13 03/07/2016   LDLCALC 104 (H) 03/07/2016   [place] 95 07/27/2015    Physical Findings: AIMS:  , ,  ,  ,    CIWA:    COWS:  COWS Total Score: 1  Musculoskeletal: Strength & Muscle Tone: within normal limits Gait & Station: normal Patient leans: N/A  Psychiatric Specialty Exam: Physical Exam  Constitutional: She is oriented to person, place, and time. She appears well-developed and well-nourished.  HENT:  Head: Normocephalic and atraumatic.  Eyes: Conjunctivae and EOM are normal.  Neck: Normal range of motion.  Respiratory: Effort normal.  Musculoskeletal: Normal range of motion.  Neurological: She is alert and oriented to person, place, and time.  Psychiatric: Judgment normal. Her affect is angry. She is agitated and combative. She is not actively hallucinating. Thought content is paranoid. Cognition and memory are normal.    Review of Systems  Constitutional: Negative.   HENT: Negative.        Rhinorrhea  Eyes: Positive for blurred vision.  Neurological: Negative.   Psychiatric/Behavioral: Positive for hallucinations.       Irritable    Blood pressure 125/76, pulse 89, temperature 98.3 F (36.8 C), temperature source Oral, resp. rate 20, height 5' 4" (1.626 m), weight 90.7 kg (200 lb), last menstrual period 02/27/2016, SpO2 98 %.Body mass index is 34.33 kg/m.  General Appearance: Fairly Groomed  Eye Contact:  Absent  Speech:  Clear and Coherent  Volume:  Normal  Mood:  Dysphoric and Irritable  Affect:  Constricted  Thought Process:  Disorganized  Orientation:  Full (Time, Place, and Person)  Thought Content:  Hallucinations: Auditory Visual  Suicidal Thoughts:  No  Homicidal Thoughts:  No  Memory:  Negative NA  Judgement:  Negative  Insight:  Good  Psychomotor Activity:  Normal  Concentration:  Concentration: Good  Recall:  Good  Fund of Knowledge:  Good  Language:   Good  Akathisia:  No  Handed:   AIMS (if indicated):     Assets:   ADL's:  Intact  Cognition:  WNL  Sleep:  Number of Hours: 7.15   Treatment Plan Summary:  Limited improvement since admission. Basically nonverbal. Withdrawn to her room minimal oral intake. Only eating one meal a day and Ativan in she is only eating about 50%. She has been drinking ensures per nursing staff.  Patient is a 41 year old Caucasian female with history of schizoaffective bipolar type. He presented to the emergency department due to aggression, agitation, and psychosis. Patient has not been compliant with medications.  For schizoaffective disorder: Lack of response to Abilify monotherapy. Lack of response to Abilify in combination with haloperidol 5 mg twice a day. This point in time haloperidol and Abilify will be discontinued.   Restarted Olanzapine on 1/22. Continue  Olanzapine but will increase dose to 30 mg at bedtime..  Mouth checks have been ordered.  Mood stabilization: Patient has been taking Trileptal 300 mg a day. She prefers this medication over any other mood stabilizers  For catatonia:continue ativan 1 mg q am and 2 mg qhs  For agitation: continue Ativan 2 mg every 4 hours as needed  Patient require Manual Hold on 1/4 as she refused to leave the office where she was being interviewed on 1/4.   Ordered multivitamins  Diet: regular  Vital signs daily--- stable vital signs today  Oral intake is fair  Precautions every 15 minute checks  Hospitalization status continue involuntary commitment------ referral has been made to [facility] recommending guardian she was mailed to the patient's mother on 1/11.   Labs: TSH, hemoglobin A1c and lipid panel, b12, BMP--has refused all labs---finally cooperative with labs on 1/10.  All wnl.  Basic metabolic panel from this morning was within the normal limits  Spoke with pt's mother again on 1/24. Paper work for  [employer] has been completed she plans  to take it to [place] today.  We will touch base again on Monday at 1:30 pm.  Left message for mother with pt security code, visitation and phone hours on 1/26  Annia Friendly, [place] 03/23/2016, 3:25 PM

## 2016-03-23 NOTE — Progress Notes (Signed)
2235: Patient presented to the nurses station complaining of hair itching and reporting that she may have lice. Was carefully assessed and no lice found. Patient has not been washing her hair. Was encouraged to wash in order to decrease itching. Patient currently washing her hair. Staff continue to monitor.

## 2016-03-24 NOTE — Plan of Care (Signed)
Problem: Self-Care: Goal: Ability to participate in self-care as condition permits will improve Outcome: Not Progressing Continues to have very poor hygiene, appears disheveled.

## 2016-03-24 NOTE — Progress Notes (Signed)
Pt noted to be out of her room, sitting in dayroom, watching TV with peers. Safety maintained. Will continue to monitor.

## 2016-03-24 NOTE — Progress Notes (Signed)
[facility] MD Progress Note  03/24/2016 1:41 PM Jessica [name]  MRN:  [id number] Subjective:  Patient is a 41 year old Caucasian female with history of schizoaffective disorder bipolar type. She was brought in by police to [facility] emergency department on January 3rd. Per the emergency departments note patient's mother called EMS after the patient grabbed her arm and wouldn't let go. Upon EMS arrival the patient was standing aggressively in the doorway and saying "[place], [place]. There is too much evil in [place]". She was uncooperative with evaluation yelling occasionally. Nurses in the ER described her as agitated and refusing to dressed out in his scrubs tangential.  It has been reported patient was noncompliant with her medications.  Per chart review she was hospitalized twice last year in behavioral health . Her last admission was in June 2017. She was discharged with a diagnosis of schizoaffective disorder bipolar type. During her stay in the hospital she was placed on known emergency forced medications.    1/22 Patient irritable, refuses to speak with me. Claims that she has lice and is asking for clippers to shave her head. Per nursing staff there is no evidence of days. Vision has been uncooperative. Despite taking Abilify patient has not improved. She has been during the hospital for 18 days with minimal improvement.  During the early part of her hospitalization she had responded well to olanzapine. She requested to have this medication change because she felt Abilify at work even better for her in the past. This was confirmed by her mother. Here she has been receiving Abilify 30 mg for several weeks with no response. At this point Abilify needs to be discontinued. I will retry her on olanzapine.   1/23 patient was less irritable and somewhat cooperative with assessment today. She continues to be delusional and paranoid. She sat and spoke with me. Patient says that her mother has  been hospitalized and she would like to be discharged in order to help her mother. She also reports today she had visitors from her undergrad (there has been no visitation time today, nobody has come to visit the patient today). She reported having some concern about patient's having intercourse, she complained about the "pimping going on here at the hospital" and having to perform sexual favors in return for things.   He we are doing something illegal here at the hospital giving her medications that are illegal.  She continues to report having auditory and visual hallucinations but mainly occur at nighttime.  She hears multiple voices of people talking. As far as side effects she complains of feeling that medication is causing blurry vision and makes her feel "out of it".   Patient reports feeling better. She denies problems with her mood and denies suicidality. She is actually requesting to be discharged soon.  Ate 50% breakfast and 100% of lunch.  Took meds yesterday and today. Not attending any programming.  1/24 Redith was seen in treatment team today for the first time in 20 days. She participated and asked questions appropriately.  She continued very disheveled. She reported feeling a little overly sedated with the medications. But did not voice any other problems or concerns. She denied having major problems with mood. Feels that her appetite has been improving. Continues to have auditory hallucinations at night. She denies having any suicidality or homicidality. No delusional thinking was noted.   100% compliant with medications. Appears that oral intake has improved, patient had 3 meals yesterday. Ate 50% of 2 meals and  100% of another one. Patient was cooperative with labs this morning. Patient continues to refuse  to participate in programming.  1/25 patient sat up in bed and spoke with this Clinical research associatewriter. She was pleasant and cooperative. She did not voice any delusions today. Patient has  reported improvement in mood and appetite. She says that the hallucinations continue to occur but they're "quick and blurry". She is tolerating medications well her only concern is some sedation however with the recent changes made she feels less tired. She denies major issues with mood, she denies suicidality, homicidality. She denies any physical complaints.  She was ok today to have us contact her mother and provide her with the security code.   100% compliant with medications. Appears that oral intake has improved since Monday, patient had 3 meals yesterday. Ate 50% of 2 meals and 100% of another one.   1/26 patient did not appear as cooperative as she has been over the last 2 days. She was again, suspicious. Very hesitant about answering questions. Saying she rather not answer certain questions.  Appears last night she was hesitant about taking her medications but eventually did. Patient states in her room most of the day. Yesterday she stated in a group for a few minutes. That is the only group she has participated in that I can see. Patient has started to eat more however yesterday her oral intake was little less that over the last 2 days. Continues to worry that were trying to overmedicate her and that were doing illegal things (using illegal drugs)  1/27 was contacted by the patient's mother yesterday. The guardianship papers have been [place]. Mother told me that the patient will likely be served with papers this weekend. Patient has unfortunately not improve much. She is very disheveled. Very withdrawn, stays in her room all day. She is guarded suspicious paranoid.   She has been compliant with medications. Appears that eventually she took her medications this morning. Her oral intake is much improved ranging from 90-100% of her meals. She is now allowing her mother to be contacted. Mother plans to visit her this weekend. I will also help us figure out her  baseline.    Per nursing: At medication administration in the medication room, pt swallows multivitamin first. Refuses to drink water because "the water has medication in it too" and swallows multivitamin without water. Pt begins to gag, acting as if she is going to vomit, but continues to refuse water. Pt leaves medication room without taking her other medications stating, "I will have to come back to take my other medications." Safety maintained. Will continue to monitor.   Not Progressing Remains isolative, paranoid, endorsing poor insight and poor judgement  Principal Problem: Schizoaffective disorder, bipolar type (HCC) Diagnosis:   Patient Active Problem List   Diagnosis Date Noted  . IBS (irritable bowel syndrome) [K58.9] 03/21/2016  . Schizoaffective disorder, bipolar type (HCC) [F25.0] 07/25/2015   Total Time spent with patient: 30 minutes  Past Psychiatric History: Patient has been hospitalized several times at behavioral health in [place]. She was just discharged in July 2017.  I don't have any information regarding prior suicidal attempts or self-injurious behaviors   Past Medical History:  Past Medical History:  Diagnosis Date  . Anxiety   . Bipolar 1 disorder (HCC)   . Depression   . Schizophrenia (HCC)    History reviewed. No pertinent surgical history.  Family History:  Family History  Problem  Relation Age of Onset  . Hypertension Mother   . Mental illness Father    Family Psychiatric  History: Unknown at this point as patient is uncooperative   Social History: Not much information is available at this time other than she lives with her mother in [place]. 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 Marital status: Single    Spouse name: N/A  . Number of children: N/A  . Years of education: N/A   Social History Main  Topics  . Smoking status: Never Smoker  . Smokeless tobacco: Never Used  . Alcohol use No  . Drug use: No  . Sexual activity: No   Other Topics Concern  . None   Social History Narrative  . None   Additional Social History:    Pain Medications: none Prescriptions: none Over the Counter: none History of alcohol / drug use?: No history of alcohol / drug abuse Negative Consequences of Use: Personal relationships, Financial, Work / School Withdrawal Symptoms: Other (Comment)      Current Medications: Current Facility-Administered Medications  Medication Dose Route Frequency Provider Last Rate Last Dose  . acetaminophen (TYLENOL) tablet 650 mg  650 mg Oral Q6H PRN Jimmy Footman, MD      . alum & mag hydroxide-simeth (MAALOX/MYLANTA) 200-200-20 MG/5ML suspension 30 mL  30 mL Oral Q4H PRN Jimmy Footman, MD      . feeding supplement (ENSURE ENLIVE) (ENSURE ENLIVE) liquid 237 mL  237 mL Oral BID BM Jimmy Footman, MD   237 mL at 03/24/16 1000  . LORazepam (ATIVAN) tablet 1 mg  1 mg Oral Daily Jimmy Footman, MD   1 mg at 03/24/16 0818  . LORazepam (ATIVAN) tablet 2 mg  2 mg Oral QHS Jimmy Footman, MD   2 mg at 03/23/16 2214  . magnesium hydroxide (MILK OF MAGNESIA) suspension 30 mL  30 mL Oral Daily PRN Jimmy Footman, MD   30 mL at 03/21/16 1715  . multivitamin with minerals tablet 1 tablet  1 tablet Oral Daily Jimmy Footman, MD   1 tablet at 03/24/16 0817  . OLANZapine (ZYPREXA) tablet 30 mg  30 mg Oral QHS Jimmy Footman, MD   30 mg at 03/23/16 2213  . Oxcarbazepine (TRILEPTAL) tablet 300 mg  300 mg Oral BID Jimmy Footman, MD   300 mg at 03/24/16 0818    Lab Results:  No results found for this or any previous visit (from the past 48 hour(s)).  Blood Alcohol level:  Lab Results  Component Value Date   ETH <5 02/29/2016   ETH <5 08/03/2015    Metabolic Disorder Labs: Lab  Results  Component Value Date   HGBA1C 5.4 03/07/2016   MPG 108 03/07/2016   MPG 117 07/27/2015   Lab Results  Component Value Date   PROLACTIN 20.7 07/27/2015   Lab Results  Component Value Date   CHOL 165 03/07/2016   TRIG 67 03/07/2016   HDL 48 03/07/2016   CHOLHDL 3.4 03/07/2016   VLDL 13 03/07/2016   LDLCALC 104 (H) 03/07/2016   LDLCALC 95 07/27/2015    Physical Findings: AIMS:  , ,  ,  ,    CIWA:    COWS:  COWS Total Score: 1  Musculoskeletal: Strength & Muscle Tone: within normal limits Gait & Station: normal Patient leans:  N/A  Psychiatric Specialty Exam: Physical Exam  Nursing note and vitals reviewed. Constitutional: She is oriented to person, place, and time. She appears well-nourished.  HENT:  Head: Normocephalic and atraumatic.  Eyes: Conjunctivae and EOM are normal.  Neck: Normal range of motion.  Respiratory: Effort normal.  Musculoskeletal: Normal range of motion.  Neurological: She is alert and oriented to person, place, and time.    Review of Systems  Constitutional: Negative.   HENT: Negative.   Eyes: Negative.   Respiratory: Negative.   Cardiovascular: Negative.   Gastrointestinal: Negative.   Genitourinary: Negative.   Musculoskeletal: Negative.   Skin: Negative.   Neurological: Negative.   Endo/Heme/Allergies: Negative.   Psychiatric/Behavioral: Positive for hallucinations. The patient has insomnia.   All other systems reviewed and are negative.   Blood pressure 105/63, pulse 87, temperature 98.4 F (36.9 C), temperature source Oral, resp. rate 20, height 5\' 4"  (1.626 m), weight 90.7 kg (200 lb), last menstrual period 02/27/2016, SpO2 98 %.Body mass index is 34.33 kg/m.  General Appearance: Disheveled  Eye Contact:  Minimal  Speech:  Normal Rate  Volume:  Normal  Mood:  less Irrtiablte   Affect:  Constricted  Thought Process:  Disorganized and Descriptions of Associations: Loose  Orientation:  Full (Time, Place, and Person)   Thought Content:  Delusions and hallucinations  Suicidal Thoughts:  No  Homicidal Thoughts:  No  Memory:  Immediate;   Poor Recent;   Poor Remote;   Poor  Judgement:  Impaired  Insight:  Lacking  Psychomotor Activity:  Decreased  Concentration:  Concentration: Poor and Attention Span: Poor  Recall:  Poor  Fund of Knowledge:  Poor  Language:  Fair  Akathisia:  No  Handed:    AIMS (if indicated):     Assets:  Communication Skills Leisure Time  ADL's:  Intact  Cognition:  Impaired,  Mild  Sleep:  Number of Hours: 6.5     Treatment Plan Summary:  Limited improvement since admission. Basically nonverbal. Withdrawn to her room minimal oral intake. Only eating one meal a day and Ativan in she is only eating about 50%. She has been drinking ensures per nursing staff.  Patient is a 41 year old Caucasian female with history of schizoaffective bipolar type. He presented to the emergency department due to aggression, agitation, and psychosis. Patient has not been compliant with medications.  For schizoaffective disorder: Lack of response to Abilify monotherapy. Lack of response to Abilify in combination with haloperidol 5 mg twice a day. This point in time haloperidol and Abilify will be discontinued.   Restarted Olanzapine on 1/22. Continue  Olanzapine which has been increased to 30 mg at bedtime..  Mouth checks have been ordered.  Mood stabilization: Patient has been taking Trileptal 300 mg a day. She prefers this medication over any other mood stabilizers  For catatonia:continue ativan 1 mg q am and 2 mg qhs  For agitation: continue Ativan 2 mg every 4 hours as needed  Patient require Manual Hold on 1/4 as she refused to leave the office where she was being interviewed on 1/4.   Ordered multivitamins--- will discontinue these as patient had trouble swallowing it and she is now eating more  Diet regular  Vital signs daily--- stable vital signs today  Oral intake is  fair  Precautions every 15 minute checks  Hospitalization status continue involuntary commitment------ referral has been made to [facility].  Letter recommending guardian she was mailed to the patient's mother on 1/11.  Labs: TSH, hemoglobin A1c and lipid panel, b12, BMP--has refused all labs---finally cooperative with labs on 1/10.  All wnl.  Basic metabolic panel from this morning was within the normal limits    Jimmy Footman, MD 03/24/2016, 1:41 PM

## 2016-03-24 NOTE — Plan of Care (Signed)
Problem: Coping: Goal: Ability to verbalize feelings will improve Outcome: Not Progressing Denial, avoiding.olative

## 2016-03-24 NOTE — [facility] Group Notes (Signed)
[facility] Group Notes:  (Nursing/MHT/Case Management/Adjunct)  Date:  03/24/2016  Time:  6:00 AM  Type of Therapy:  Psychoeducational Skills  Participation Level:  Did Not Attend  Summary of Progress/Problems:  Chancy MilroyLaquanda Y Eulene Pekar 03/24/2016, 6:00 AM

## 2016-03-24 NOTE — Progress Notes (Signed)
Pt in dayroom, visiting with her mother.

## 2016-03-24 NOTE — Progress Notes (Signed)
Patient has been sleeping. No sign of discomfort noted. Staff continue to monitor for safety.

## 2016-03-24 NOTE — Progress Notes (Addendum)
Pt awake, alert, oriented today. Isolative to her room. No interaction with peers. Interacts with staff only when approached.Only comes out for meals and medications. Denies SI/HI/AVH. Continues to appear disheveled with very poor hygiene. Night shift did report pt washed her hair last night, but it continues to appear very oily. Appears suspicious, paranoid, guarded. Reports fair sleep last night without medications to sleep, fair appetite, low energy, poor concentration. Rates depression 0/10, hopelessness 0/10, anxiety 2/10 (low 0-10 high). Goal today is "to be kind to others and to be a better christian." Pt is medication compliant, took all medications ordered this morning.  Support and encouragement provided with use of therapeutic communication. Medications administered as ordered with education. Safety maintained with every 15 minute checks. Will continue to monitor.

## 2016-03-24 NOTE — Progress Notes (Signed)
At medication administration in the medication room, pt swallows multivitamin first. Refuses to drink water because "the water has medication in it too" and swallows multivitamin without water. Pt begins to gag, acting as if she is going to vomit, but continues to refuse water. Pt leaves medication room without taking her other medications stating, "I will have to come back to take my other medications." Safety maintained. Will continue to monitor.

## 2016-03-24 NOTE — Plan of Care (Signed)
Problem: Health Behavior/Discharge Planning: Goal: Ability to manage health-related needs will improve Outcome: Not Progressing Remains isolative, paranoid, endorsing poor insight and poor judgement

## 2016-03-24 NOTE — [facility] Group Notes (Signed)
[facility] LCSW Group Therapy  03/24/2016 2:13 PM (Note Entry for Claudine M. Bandi, LCSW)  Type of Therapy:  Group Therapy  Participation Level:  Patient did not attend group. CSW invited patient to group.   Summary of Progress/Problems: Balance in life: Patients will discuss the concept of balance and how it looks and feels to be unbalanced. Pt will identify areas in their life that is unbalanced and ways to become more balanced. They discussed what aspects in their lives has influenced their self care. Patients also discussed self care in the areas of self regulation/control, hygiene/appearance, sleep/relaxation, healthy leisure, healthy eating habits, exercise, inner peace/spirituality, self improvement, sobriety, and health management. They were challenged to identify changes that are needed in order to improve self care.  Scribe for Group Therapy Note Satya Buttram [name] MSW, LCSWA 03/24/2016 2:13 PM

## 2016-03-25 MED ORDER — TOPIRAMATE 25 MG PO TABS
50.0000 mg | ORAL_TABLET | Freq: Every day | ORAL | Status: DC
  Administered 2016-03-25 – 2016-03-27 (×3): 50 mg via ORAL
  Filled 2016-03-25 (×3): qty 2

## 2016-03-25 NOTE — Progress Notes (Signed)
Pt resistant to care. Poor hygiene. Empty and depressed. Paces halls or stays isolated to room. Refuses interaction. Safety maintained. Will continue to monitor.

## 2016-03-25 NOTE — Plan of Care (Signed)
Problem: Nutrition: Goal: Adequate nutrition will be maintained Outcome: Progressing Pt eating more of her meals. Refuses ensure sometimes, see MAR.

## 2016-03-25 NOTE — Plan of Care (Signed)
Problem: Education: Goal: Will be free of psychotic symptoms Outcome: Not Progressing Pt presents with bizarre behavior. Remains hyper religious and ritualistic at times.

## 2016-03-25 NOTE — Progress Notes (Signed)
[facility] MD Progress Note  03/25/2016 1:52 PM Jessica [name]  MRN:  [id number] Subjective:  Patient is a 41 year old Caucasian female with history of schizoaffective disorder bipolar type. She was brought in by police to [facility] emergency department on January 3rd. Per the emergency departments note patient's mother called EMS after the patient grabbed her arm and wouldn't let go. Upon EMS arrival the patient was standing aggressively in the doorway and saying "[place], [place]. There is too much evil in [place]". She was uncooperative with evaluation yelling occasionally. Nurses in the ER described her as agitated and refusing to dressed out in his scrubs tangential.  It has been reported patient was noncompliant with her medications.  Per chart review she was hospitalized twice last year in behavioral health [place]. Her last admission was in June 2017. She was discharged with a diagnosis of schizoaffective disorder bipolar type. During her stay in the hospital she was placed on known emergency forced medications.    1/22 Patient irritable, refuses to speak with me. Claims that she has lice and is asking for clippers to shave her head. Per nursing staff there is no evidence of days. Vision has been uncooperative. Despite taking Abilify patient has not improved. She has been during the hospital for 18 days with minimal improvement.  During the early part of her hospitalization she had responded well to olanzapine. She requested to have this medication change because she felt Abilify at work even better for her in the past. This was confirmed by her mother. Here she has been receiving Abilify 30 mg for several weeks with no response. At this point Abilify needs to be discontinued. I will retry her on olanzapine.   1/23 patient was less irritable and somewhat cooperative with assessment today. She continues to be delusional and paranoid. She sat and spoke with me. Patient says that her mother has  been hospitalized and she would like to be discharged in order to help her mother. She also reports today she had visitors from her undergrad (there has been no visitation time today, nobody has come to visit the patient today). She reported having some concern about patient's having intercourse, she complained about the "pimping going on here at the hospital" and having to perform sexual favors in return for things.   He we are doing something illegal here at the hospital giving her medications that are illegal.  She continues to report having auditory and visual hallucinations but mainly occur at nighttime.  She hears multiple voices of people talking. As far as side effects she complains of feeling that medication is causing blurry vision and makes her feel "out of it".   Patient reports feeling better. She denies problems with her mood and denies suicidality. She is actually requesting to be discharged soon.  Ate 50% breakfast and 100% of lunch.  Took meds yesterday and today. Not attending any programming.  1/24 Jessica [name] was seen in treatment team today for the first time in 20 days. She participated and asked questions appropriately.  She continued very disheveled. She reported feeling a little overly sedated with the medications. But did not voice any other problems or concerns. She denied having major problems with mood. Feels that her appetite has been improving. Continues to have auditory hallucinations at night. She denies having any suicidality or homicidality. No delusional thinking was noted.   100% compliant with medications. Appears that oral intake has improved, patient had 3 meals yesterday. Ate 50% of 2 meals and  100% of another one. Patient was cooperative with labs this morning. Patient continues to refuse  to participate in programming.  1/25 patient sat up in bed and spoke with this Clinical research associate. She was pleasant and cooperative. She did not voice any delusions today. Patient has  reported improvement in mood and appetite. She says that the hallucinations continue to occur but they're "quick and blurry". She is tolerating medications well her only concern is some sedation however with the recent changes made she feels less tired. She denies major issues with mood, she denies suicidality, homicidality. She denies any physical complaints.  She was ok today to have [place] contact her mother and provide her with the security code.   100% compliant with medications. Appears that oral intake has improved since Monday, patient had 3 meals yesterday. Ate 50% of 2 meals and 100% of another one.   1/26 patient did not appear as cooperative as she has been over the last 2 days. She was again, suspicious. Very hesitant about answering questions. Saying she rather not answer certain questions.  Appears last night she was hesitant about taking her medications but eventually did. Patient states in her room most of the day. Yesterday she stated in a group for a few minutes. That is the only group she has participated in that I can see. Patient has started to eat more however yesterday her oral intake was little less that over the last 2 days. Continues to worry that were trying to overmedicate her and that were doing illegal things (using illegal drugs)  1/27 was contacted by the patient's mother yesterday. The guardianship papers have been submitted to the court house in [facility]. Mother told me that the patient will likely be served with papers this weekend. Patient has unfortunately not improve much. She is very disheveled. Very withdrawn, stays in her room all day. She is guarded suspicious paranoid.   She has been compliant with medications. Appears that eventually she took her medications this morning. Her oral intake is much improved ranging from 90-100% of her meals. She is now allowing her mother to be contacted. Mother plans to visit her this weekend. I will also help [place] figure out her  baseline.   1/28 patient appears somewhat more depressed today. She is hoping to be discharged to home but is concerned as she does not have a place to go. She had a visitation from her mother last night. Says that she doesn't think her mother will allow her to return there. She continues to have hallucinations but she says they're not as intense. She denies major problems with appetite, energy, sleep or concentration. Says that she has been eating much better. She is tolerating medications well and asked about their side effects. We discussed the risks of weight gain. Patient was open to start treatment with Topamax to prevent any potential weight gain from the olanzapine.   Per nursing: Pt with very poor hygiene and body odor. Provided hygiene products, clean scrubs, socks and underwear and clean towels to pt. Much encouragement given for shower today. Offered support with shower, but pt refuses. Verbalized understanding. Safety maintained. Will continue to monitor.   Pt noted to be out of her room, sitting in dayroom, watching TV with peers. Safety maintained. Will continue to monitor.  Principal Problem: Schizoaffective disorder, bipolar type (HCC) Diagnosis:   Patient Active Problem List   Diagnosis Date Noted  . IBS (irritable bowel syndrome) [K58.9] 03/21/2016  . Schizoaffective disorder, bipolar type (  HCC) [F25.0] 07/25/2015   Total Time spent with patient: 30 minutes  Past Psychiatric History: Patient has been hospitalized several times at behavioral health in [place]. She was just discharged in July 2017.  I don't have any information regarding prior suicidal attempts or self-injurious behaviors   Past Medical History:  Past Medical History:  Diagnosis Date  . Anxiety   . Bipolar 1 disorder (HCC)   . Depression   . Schizophrenia (HCC)    History reviewed. No pertinent surgical history.  Family History:  Family History  Problem Relation Age of Onset  . Hypertension  Mother   . Mental illness Father    Family Psychiatric  History: Unknown at this point as patient is uncooperative   Social History: Not much information is available at this time other than she lives with her mother in [place]. 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 Marital status: Single    Spouse name: N/A  . Number of children: N/A  . Years of education: N/A   Social History Main Topics  . Smoking status: Never Smoker  . Smokeless tobacco: Never Used  . Alcohol use No  . Drug use: No  . Sexual activity: No   Other Topics Concern  . None   Social History Narrative  . None   Additional Social History:    Pain Medications: none Prescriptions: none Over the Counter: none History of alcohol / drug use?: No history of alcohol / drug abuse Negative Consequences of Use: Personal relationships, Financial, Work / School Withdrawal Symptoms: Other (Comment)      Current Medications: Current Facility-Administered Medications  Medication Dose Route Frequency Provider Last Rate Last Dose  . acetaminophen (TYLENOL) tablet 650 mg  650 mg Oral Q6H PRN Jimmy Footman, MD      . alum & mag hydroxide-simeth (MAALOX/MYLANTA) 200-200-20 MG/5ML suspension 30 mL  30 mL Oral Q4H PRN Jimmy Footman, MD      . feeding supplement (ENSURE ENLIVE) (ENSURE ENLIVE) liquid 237 mL  237 mL Oral BID BM Jimmy Footman, MD   237 mL at 03/24/16 1000  . LORazepam (ATIVAN) tablet 1 mg  1 mg Oral Daily Jimmy Footman, MD   1 mg at 03/25/16 0752  . LORazepam (ATIVAN) tablet 2 mg  2 mg Oral QHS Jimmy Footman, MD   2 mg at 03/24/16 2116  . magnesium hydroxide (MILK OF MAGNESIA) suspension 30 mL  30 mL Oral Daily PRN Jimmy Footman, MD   30 mL at 03/21/16 1715  . OLANZapine (ZYPREXA) tablet 30 mg  30 mg Oral  QHS Jimmy Footman, MD   30 mg at 03/24/16 2115  . topiramate (TOPAMAX) tablet 50 mg  50 mg Oral QHS Jimmy Footman, MD        Lab Results:  No results found for this or any previous visit (from the past 48 hour(s)).  Blood Alcohol level:  Lab Results  Component Value Date   [facility] <5 02/29/2016   ETH <5 08/03/2015    Metabolic Disorder Labs: Lab Results  Component Value Date   HGBA1C 5.4 03/07/2016   MPG 108 03/07/2016   MPG 117 07/27/2015   Lab Results  Component Value Date   PROLACTIN 20.7 07/27/2015   Lab Results  Component Value Date   CHOL 165 03/07/2016   TRIG  67 03/07/2016   HDL 48 03/07/2016   CHOLHDL 3.4 03/07/2016   VLDL 13 03/07/2016   LDLCALC 104 (H) 03/07/2016   LDLCALC 95 07/27/2015    Physical Findings: AIMS:  , ,  ,  ,    CIWA:    COWS:  COWS Total Score: 1  Musculoskeletal: Strength & Muscle Tone: within normal limits Gait & Station: normal Patient leans: N/A  Psychiatric Specialty Exam: Physical Exam  Nursing note and vitals reviewed. Constitutional: She is oriented to person, place, and time. She appears well-nourished.  HENT:  Head: Normocephalic and atraumatic.  Eyes: Conjunctivae and EOM are normal.  Neck: Normal range of motion.  Respiratory: Effort normal.  Musculoskeletal: Normal range of motion.  Neurological: She is alert and oriented to person, place, and time.    Review of Systems  Constitutional: Negative.   HENT: Negative.   Eyes: Negative.   Respiratory: Negative.   Cardiovascular: Negative.   Gastrointestinal: Negative.   Genitourinary: Negative.   Musculoskeletal: Negative.   Skin: Negative.   Neurological: Negative.   Endo/Heme/Allergies: Negative.   Psychiatric/Behavioral: Positive for hallucinations. The patient has insomnia.   All other systems reviewed and are negative.   Blood pressure 119/67, pulse 84, temperature 98.5 F (36.9 C), temperature source Oral, resp. rate 18, height 5\' 4"   (1.626 m), weight 90.7 kg (200 lb), last menstrual period 02/27/2016, SpO2 98 %.Body mass index is 34.33 kg/m.  General Appearance: Disheveled  Eye Contact:  Minimal  Speech:  Normal Rate  Volume:  Normal  Mood:  less Irrtiablte   Affect:  Constricted  Thought Process:  Disorganized and Descriptions of Associations: Loose  Orientation:  Full (Time, Place, and Person)  Thought Content:  Delusions and hallucinations  Suicidal Thoughts:  No  Homicidal Thoughts:  No  Memory:  Immediate;   Poor Recent;   Poor Remote;   Poor  Judgement:  Impaired  Insight:  Lacking  Psychomotor Activity:  Decreased  Concentration:  Concentration: Poor and Attention Span: Poor  Recall:  Poor  Fund of Knowledge:  Poor  Language:  Fair  Akathisia:  No  Handed:    AIMS (if indicated):     Assets:  Communication Skills Leisure Time  ADL's:  Intact  Cognition:  Impaired,  Mild  Sleep:  Number of Hours: 7.5     Treatment Plan Summary:  Limited improvement since admission. Basically nonverbal. Withdrawn to her room minimal oral intake. Only eating one meal a day and Ativan in she is only eating about 50%. She has been drinking ensures per nursing staff.  Patient is a 41 year old Caucasian female with history of schizoaffective bipolar type. He presented to the emergency department due to aggression, agitation, and psychosis. Patient has not been compliant with medications.  For schizoaffective disorder: Lack of response to Abilify monotherapy. Lack of response to Abilify in combination with haloperidol 5 mg twice a day. This point in time haloperidol and Abilify will be discontinued.   Restarted Olanzapine on 1/22. Continue  Olanzapine which has been increased to 30 mg at bedtime..  Mouth checks have been ordered.  Mood stabilization: Today patient was concerned about possible weight gain from antipsychotics. She said that this is a big concern for her. We discussed the possibility of adding Topamax as  a mood stabilizer and also as a way to prevent any further weight gain. Patient was in agreement with this. I will discontinue Trileptal. Restart her on Topamax 50 mg daily at bedtime  For catatonia:continue  ativan 1 mg q am and 2 mg qhs  For agitation: continue Ativan 2 mg every 4 hours as needed  Patient require Manual Hold on 1/4 as she refused to leave the office where she was being interviewed on 1/4.   Ordered multivitamins--- will discontinue these as patient had trouble swallowing it and she is now eating more  Diet regular  Vital signs daily--- stable vital signs today  Oral intake is fair  Precautions every 15 minute checks  Hospitalization status continue involuntary commitment------ [facility].  Letter recommending guardian she was mailed to the patient's mother on 1/11.   Labs: TSH, hemoglobin A1c and lipid panel, b12, BMP--has refused all labs---finally cooperative with labs on 1/10.  All wnl.  Basic metabolic panel from this morning was within the normal limits    Jimmy FootmanHernandez-Gonzalez,  Wright Gravely, MD 03/25/2016, 1:52 PM

## 2016-03-25 NOTE — Progress Notes (Signed)
Pt appears to have changed her clothing to scrubs provided earlier, but does not appear or smell as if she showered as encouraged. Pt has been observed in the dayroom some today. When this nurse asked her about her visit with her mother last night, pt stated, "it was informative," but would not answer further questions regarding the visit. Pt depressed, guarded. Pt is medication compliant. Safety maintained. Will continue to monitor.

## 2016-03-25 NOTE — [facility] Group Notes (Signed)
[facility] LCSW Group Therapy  03/24/2016 1:00 PM (Note Entry for Infinity Jeffords M. Enzo Treu, LCSW)  Type of Therapy:  Group Therapy  Participation Level:  Patient did not attend group. CSW invited patient to group.   Summary of Progress/Problems:The topic for group today was emotional regulation. This group focused on both positive and negative emotion identification and allowed group members to process ways to identify feelings, regulate negative emotions, and find healthy ways to manage internal/external emotions. Group members were asked to reflect on a time when their reaction to an emotion led to a negative outcome and explored how alternative responses using emotion regulation would have benefited them. Group members were also asked to discuss a time when emotion regulation was utilized when a negative emotion was experienced.   Cecelia Graciano LCSW

## 2016-03-25 NOTE — Progress Notes (Signed)
Pt with very poor hygiene and body odor. Provided hygiene products, clean scrubs, socks and underwear and clean towels to pt. Much encouragement given for shower today. Offered support with shower, but pt refuses. Verbalized understanding. Safety maintained. Will continue to monitor.

## 2016-03-25 NOTE — [facility] Group Notes (Signed)
[facility] LCSW Group Therapy  1/28/20181:00 PM(Note Entry for Jessica [name] M. Emon Miggins, LCSW)  Type of Therapy: Group Therapy  Participation Level: Patient did not attend group. CSW invited patient to group.   Summary of Progress/Problems:The topic for group today was emotional regulation. This group focused on both positive and negative emotion identification and allowed group members to process ways to identify feelings, regulate negative emotions, and find healthy ways to manage internal/external emotions. Group members were asked to reflect on a time when their reaction to an emotion led to a negative outcome and explored how alternative responses using emotion regulation would have benefited them. Group members were also asked to discuss a time when emotion regulation was utilized when a negative emotion was experienced.   Vivianna Piccini LCSW  

## 2016-03-25 NOTE — [facility] Group Notes (Signed)
[facility] Group Notes:  (Nursing/MHT/Case Management/Adjunct)  Date:  03/25/2016  Time:  12:23 AM  Type of Therapy:  Evening Wrap-up Group  Participation Level:  Did Not Attend  Participation Quality:  N/A  Affect:  N/A  Cognitive:  N/A  Insight:  None  Engagement in Group:  Did Not Attend  Modes of Intervention:  Discussion  Summary of Progress/Problems:  Tomasita MorrowChelsea Nanta Cristin Penaflor 03/25/2016, 12:23 AM

## 2016-03-26 NOTE — Progress Notes (Signed)
Recreation Therapy Notes  Date: 01.29.18 Time: 9:30 am Location: Craft Room  Group Topic: Self-expression  Goal Area(s) Addresses:  Patient will effectively use art as a means of self-expression. Patient will recognize positive benefit of self-expression. Patient will be able to identify one emotion experienced during group session. Patient will identify use of art as a coping skill.  Behavioral Response: Did not attend  Intervention: Two Faces of Me  Activity: Patients were given a blank face worksheet and were instructed to draw a line down the middle. On one side of the worksheet, patients were instructed to draw or write how they felt when they were admitted. On the other side, patients were instructed to draw or write how they want to feel when they are d/c.   Education: LRT educated patients on different forms of self-expression.   Education Outcome: Patient did not attend group.   Clinical Observations/Feedback: Patient did not attend group.  Jacquelynn CreeGreene,Satsuki Zillmer M, LRT/CTRS 03/26/2016 10:01 AM

## 2016-03-26 NOTE — Progress Notes (Signed)
Patient pleasant and cooperative. Poor hygiene. Encouraged patient to shower and change clothes. Pt refuses assistance with showering. Patient is med compliant and isolates to self and room.  Encouragement and support offered. Medications given as prescribed. Safety checks maintained. Pt acknowledges and continues to isolate to self and room. Pt remains safe on unit with q 15 min checks.

## 2016-03-26 NOTE — Plan of Care (Signed)
Problem: Safety: Goal: Ability to remain free from injury will improve Outcome: Progressing No injury reported or observed   

## 2016-03-26 NOTE — [facility] Group Notes (Signed)
[facility] Group Notes:  (Nursing/MHT/Case Management/Adjunct)  Date:  03/26/2016  Time:  3:44 PM  Type of Therapy:  Psychoeducational Skills  Participation Level:  Did Not Attend  Judeen Geralds Travis Alizandra Loh 03/26/2016, 3:44 PM 
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
## 2016-03-26 NOTE — [facility] Group Notes (Signed)
[facility] Group Notes:  (Nursing/MHT/Case Management/Adjunct)  Date:  03/26/2016  Time:  4:01 AM  Type of Therapy:  Group Therapy  Participation Level:  Did Not Attend    Veva Holesshley Imani Dakisha Schoof 03/26/2016, 4:01 AM

## 2016-03-26 NOTE — [facility] Group Notes (Signed)
[facility] Group Notes:  (Nursing/MHT/Case Management/Adjunct)  Date:  03/26/2016  Time:  9:33 PM  Type of Therapy:  Psychoeducational Skills  Participation Level:  Did Not Attend  Participation Quality:   Summary of Progress/Problems:  Jessica [name] [name] 03/26/2016, 9:33 PM 

## 2016-03-27 NOTE — Progress Notes (Signed)
Patient pleasant and cooperative with care. Reports did not sleep well last night. Pt refused to share with Clinical research associatewriter. Denies SI, HI, AVH. Isolates to self and room. Comes out for meals.  Encouragement and support offered. Encouraged patient to share with nurse issues from last evening.  Pt refused. Pt remains safe on unit with q 15 min checks.

## 2016-03-27 NOTE — Progress Notes (Signed)
D: Pt denies SI/HI/AVH. Pt is pleasant and cooperative, patient appears less anxious, less hyper religious no bizarre behavior and she is interacting just staff members appropriately.  A: Pt was offered support and encouragement. Pt was given scheduled medications. Pt was encouraged to attend groups. Q 15 minute checks were done for safety.  R:Pt did not attends group.Pt is taking medication. Pt has no complaints.Pt receptive to treatment and safety maintained on unit.
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
## 2016-03-27 NOTE — Progress Notes (Signed)
Recreation Therapy Notes  Date: 01.30.18 Time: 9:30 am Location: Craft Room  Group Topic: Self-expression  Goal Area(s) Addresses:  Patient will identify one color per emotion listed on wheel. Patient will verbalize one emotion experienced during session. Patient will be educated on other forms of self-expression.  Behavioral Response: Did not attend  Intervention: Emotion Wheel  Activity: Patients were given an Emotion Wheel worksheet and were instructed to pick a color for each emotion listed on the wheel.  Education: LRT educated patients on different forms of self-expression.  Education Outcome: Patient did not attend group.  Clinical Observations/Feedback: Patient did not attend group.  Onyx Edgley M, LRT/CTRS 03/27/2016 10:07 AM 

## 2016-03-27 NOTE — Progress Notes (Signed)
One of Behavioral Health's social worker suggested that I should visit the patient. Pt. Was still in bed and she did not want to leave her room even though she agreed to talk with Chaplain. Since the Chaplain was not comfortable in talking with the patient in her room, one of the nurses who introduced Chaplain to patient said it was okay to talk with Pt. In her room. Chaplain sat on the bed opposite to where Pt. Sat and the door was left wide open. Suddenly, Pt. Became hostile and said that she did not need a Chaplain because she talks to God alone. She suggested Chaplain should leave her room and go spend his time with patients who needed him most. With a smile and calm voice, Chaplain appreciated the time Pt. Allowed him to visit and said goodbye. As  Chaplain was leaving the room, Pt. Said, "I appreciated your visit, Chaplain."

## 2016-03-27 NOTE — Plan of Care (Signed)
Problem: Education: Goal: Will be free of psychotic symptoms Outcome: Progressing Pt not showing any psychotic features

## 2016-03-28 MED ORDER — HALOPERIDOL 5 MG PO TABS
5.0000 mg | ORAL_TABLET | Freq: Every day | ORAL | Status: DC
  Administered 2016-03-28 – 2016-03-29 (×2): 5 mg via ORAL
  Filled 2016-03-28 (×2): qty 1

## 2016-03-28 MED ORDER — AMANTADINE HCL 100 MG PO CAPS
100.0000 mg | ORAL_CAPSULE | Freq: Two times a day (BID) | ORAL | Status: DC
  Administered 2016-03-28 – 2016-04-06 (×18): 100 mg via ORAL
  Filled 2016-03-28 (×18): qty 1

## 2016-03-28 NOTE — [facility] Group Notes (Signed)
[facility] Group Notes:  (Nursing/MHT/Case Management/Adjunct)  Date:  03/28/2016  Time:  1:36 AM  Type of Therapy:  Group Therapy  Participation Level:  Did Not Attend    Veva Holesshley Imani Bartlett Enke 03/28/2016, 1:36 AM

## 2016-03-28 NOTE — Progress Notes (Signed)
Patient remain in bed throughout the shift except for medications. Did not attend group. Med compliant. No PRNs given. No behavior issues noted. No voiced thoughts of hurting herself. q 15 min checks maintained for safety.

## 2016-03-28 NOTE — Progress Notes (Signed)
Recreation Therapy Notes  Date: 01.31.18 Time: 9:30 am Location: Craft Room  Group Topic: Self-esteem  Goal Area(s) Addresses:  Patient will write at least one positive trait about self. Patient will verbalize benefit of having healthy self-esteem.  Behavioral Response: Did not attend  Intervention: I Am  Activity: Patients were given worksheets with the letter I on it and were instructed to write as many positive traits inside the letter.  Education: LRT educated patients on ways to increase their self-esteem.  Education Outcome: Patient did not attend group.  Clinical Observations/Feedback: Patient did not attend group.  Marisela Line M, LRT/CTRS 03/28/2016 10:23 AM 

## 2016-03-28 NOTE — Progress Notes (Addendum)
Pt continues to be psychotic, paranoid, flat, disheveled with very poor hygiene, poor eye contact. Continues to become irritable regarding medications ordered stating, "I thought the doctor was going to discontinue these medications" at medication administration. Pt is compliant with taking medications this morning though, but required redirection regarding the medications were not discontinued. Pt stays in bed all morning, only coming out for meals/meds. Did not attend group. Denies SI/HI/AVH. Cooperative. Reports fair sleep last night, fair appetite, low energy, good concentration. Today's goal "to be kind to others and to be grateful for each moment I am here on earth" by "pray."  Support and encouragement provided with therapeutic communication. Medications administered as ordered with education. Safety maintained with every 15 minute checks. Will continue to monitor.

## 2016-03-28 NOTE — Plan of Care (Signed)
Problem: Activity: Goal: Will verbalize the importance of balancing activity with adequate rest periods Outcome: Not Progressing Pt stays in room, in bed most of the day and night. Does not attend group. Reports poor sleep at night.

## 2016-03-28 NOTE — Tx Team (Signed)
Interdisciplinary Treatment and Diagnostic Plan Update  03/28/2016 Time of Session: 10:30am Jessica ChaletJennifer L [name] MRN: [id number]  Principal Diagnosis: Schizoaffective disorder, bipolar type [facility](HCC)  Secondary Diagnoses: Principal Problem:   Schizoaffective disorder, bipolar type (HCC) Active Problems:   IBS (irritable bowel syndrome)   Current Medications:  Current Facility-Administered Medications  Medication Dose Route Frequency Provider Last Rate Last Dose  . acetaminophen (TYLENOL) tablet 650 mg  650 mg Oral Q6H PRN Jimmy FootmanAndrea Hernandez-Gonzalez, MD      . alum & mag hydroxide-simeth (MAALOX/MYLANTA) 200-200-20 MG/5ML suspension 30 mL  30 mL Oral Q4H PRN Jimmy FootmanAndrea Hernandez-Gonzalez, MD      . feeding supplement (ENSURE ENLIVE) (ENSURE ENLIVE) liquid 237 mL  237 mL Oral BID BM Jimmy FootmanAndrea Hernandez-Gonzalez, MD   237 mL at 03/27/16 1400  . LORazepam (ATIVAN) tablet 1 mg  1 mg Oral Daily Jimmy FootmanAndrea Hernandez-Gonzalez, MD   1 mg at 03/28/16 0813  . LORazepam (ATIVAN) tablet 2 mg  2 mg Oral QHS Jimmy FootmanAndrea Hernandez-Gonzalez, MD   2 mg at 03/27/16 2153  . magnesium hydroxide (MILK OF MAGNESIA) suspension 30 mL  30 mL Oral Daily PRN Jimmy FootmanAndrea Hernandez-Gonzalez, MD   30 mL at 03/21/16 1715  . OLANZapine (ZYPREXA) tablet 30 mg  30 mg Oral QHS Jimmy FootmanAndrea Hernandez-Gonzalez, MD   30 mg at 03/27/16 2153  . topiramate (TOPAMAX) tablet 50 mg  50 mg Oral QHS Jimmy FootmanAndrea Hernandez-Gonzalez, MD   50 mg at 03/27/16 2152   PTA Medications: Prescriptions Prior to Admission  Medication Sig Dispense Refill Last Dose  . ARIPiprazole (ABILIFY) 5 MG tablet Take 5 mg by mouth daily.   02/29/2016 at Unknown time  . doxepin (SINEQUAN) 10 MG capsule Take 2 capsules (20 mg total) by mouth at bedtime as needed (sleep). 30 capsule 0 unknown  . hydrOXYzine (ATARAX/VISTARIL) 25 MG tablet Take 1 tablet (25 mg total) by mouth every 6 (six) hours as needed for anxiety. 30 tablet 0 unknown  . OLANZapine (ZYPREXA) 2.5 MG tablet Take 5 tablets (12.5 mg  total) by mouth at bedtime. (Patient not taking: Reported on 02/29/2016) 30 tablet 0 Not Taking at Unknown time  . OXcarbazepine (TRILEPTAL) 150 MG tablet Take 1 tablet (150 mg total) by mouth 2 (two) times daily. 60 tablet 0 02/29/2016 at Unknown time  . ziprasidone (GEODON) 20 MG capsule Take 1 capsule (20 mg total) by mouth 2 (two) times daily as needed (SEVERE ANXIETY/AGITATION). 30 capsule 0 unknown    Patient Stressors: Financial difficulties Marital or family conflict Medication change or noncompliance  Patient Strengths: Ability for Warden/rangerinsight Communication skills Motivation for treatment/growth  Treatment Modalities: Medication Management, Group therapy, Case management,  1 to 1 session with clinician, Psychoeducation, Recreational therapy.   Physician Treatment Plan for Primary Diagnosis: Schizoaffective disorder, bipolar type (HCC) Long Term Goal(s): Improvement in symptoms so as ready for discharge Improvement in symptoms so as ready for discharge   Short Term Goals: Ability to identify changes in lifestyle to reduce recurrence of condition will improve Ability to demonstrate self-control will improve Ability to identify and develop effective coping behaviors will improve Compliance with prescribed medications will improve Ability to identify triggers associated with substance abuse/mental health issues will improve Ability to identify changes in lifestyle to reduce recurrence of condition will improve Ability to verbalize feelings will improve Ability to demonstrate self-control will improve Compliance with prescribed medications will improve Ability to identify triggers associated with substance abuse/mental health issues will improve  Medication Management: Evaluate patient's response, side effects, and  tolerance of medication regimen.  Therapeutic Interventions: 1 to 1 sessions, Unit Group sessions and Medication administration.  Evaluation of Outcomes:  Progressing  Physician Treatment Plan for Secondary Diagnosis: Principal Problem:   Schizoaffective disorder, bipolar type (HCC) Active Problems:   IBS (irritable bowel syndrome)  Long Term Goal(s): Improvement in symptoms so as ready for discharge Improvement in symptoms so as ready for discharge   Short Term Goals: Ability to identify changes in lifestyle to reduce recurrence of condition will improve Ability to demonstrate self-control will improve Ability to identify and develop effective coping behaviors will improve Compliance with prescribed medications will improve Ability to identify triggers associated with substance abuse/mental health issues will improve Ability to identify changes in lifestyle to reduce recurrence of condition will improve Ability to verbalize feelings will improve Ability to demonstrate self-control will improve Compliance with prescribed medications will improve Ability to identify triggers associated with substance abuse/mental health issues will improve     Medication Management: Evaluate patient's response, side effects, and tolerance of medication regimen.  Therapeutic Interventions: 1 to 1 sessions, Unit Group sessions and Medication administration.  Evaluation of Outcomes: Progressing   RN Treatment Plan for Primary Diagnosis: Schizoaffective disorder, bipolar type (HCC) Long Term Goal(s): Knowledge of disease and therapeutic regimen to maintain health will improve  Short Term Goals: Ability to remain free from injury will improve, Ability to demonstrate self-control, Ability to disclose and discuss suicidal ideas and Compliance with prescribed medications will improve  Medication Management: RN will administer medications as ordered by provider, will assess and evaluate patient's response and provide education to patient for prescribed medication. RN will report any adverse and/or side effects to prescribing provider.  Therapeutic  Interventions: 1 on 1 counseling sessions, Psychoeducation, Medication administration, Evaluate responses to treatment, Monitor vital signs and CBGs as ordered, Perform/monitor CIWA, COWS, AIMS and Fall Risk screenings as ordered, Perform wound care treatments as ordered.  Evaluation of Outcomes: Progressing   LCSW Treatment Plan for Primary Diagnosis: Schizoaffective disorder, bipolar type (HCC) Long Term Goal(s): Safe transition to appropriate next level of care at discharge, Engage patient in therapeutic group addressing interpersonal concerns.  Short Term Goals: Engage patient in aftercare planning with referrals and resources, Increase social support, Facilitate acceptance of mental health diagnosis and concerns and Increase skills for wellness and recovery  Therapeutic Interventions: Assess for all discharge needs, 1 to 1 time with Social worker, Explore available resources and support systems, Assess for adequacy in community support network, Educate family and significant other(s) on suicide prevention, Complete Psychosocial Assessment, Interpersonal group therapy.  Evaluation of Outcomes: Progressing   Progress in Treatment: Attending groups: No. Participating in groups: No. Taking medication as prescribed: Yes. Toleration medication: Yes. Family/Significant other contact made: No, will contact:  patient has not consented for family contact Patient understands diagnosis: Yes. Discussing patient identified problems/goals with staff: Yes. Medical problems stabilized or resolved: Yes. Denies suicidal/homicidal ideation: Yes. Issues/concerns per patient self-inventory: No. Other: n/a  New problem(s) identified: None identified at this time.   New Short Term/Long Term Goal(s): None identified at this time.   Discharge Plan or Barriers: Patient is on [facility].   Reason for Continuation of Hospitalization: Delusions  Hallucinations Medication  stabilization  Estimated Length of Stay: 7 days.   Attendees: Patient: Jessica [name] 03/28/2016 11:04 AM  Physician: Dr. Radene Journey, MD 03/28/2016 11:04 AM  Nursing: Hulan Amato, RN 03/28/2016 11:04 AM  RN Care Manager: 03/28/2016 11:04 AM  Social Worker: Hampton Abbot, Theresia Majors 03/28/2016 11:04 AM  Recreational Therapist: Jacquelynn Cree, LRT/CTRS 03/28/2016 11:04 AM    Scribe for Treatment Team: Lynden Oxford, LCSWA 03/28/2016 11:04 AM

## 2016-03-28 NOTE — [facility] Group Notes (Signed)
[facility] Group Notes:  (Nursing/MHT/Case Management/Adjunct)  Date:  03/28/2016  Time:  3:53 PM  Type of Therapy:  Psychoeducational Skills    Jessica [name] 03/28/2016, 3:53 PM
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
## 2016-03-29 MED ORDER — LORAZEPAM 1 MG PO TABS
1.0000 mg | ORAL_TABLET | Freq: Two times a day (BID) | ORAL | Status: DC
  Administered 2016-03-29 – 2016-04-06 (×14): 1 mg via ORAL
  Filled 2016-03-29 (×15): qty 1

## 2016-03-29 NOTE — [facility] Group Notes (Signed)
[facility] Group Notes:  (Nursing/MHT/Case Management/Adjunct)  Date:  03/29/2016  Time:  12:44 AM  Type of Therapy:  Psychoeducational Skills  Participation Level:  Did Not Attend  Summary of Progress/Problems:  Jessica MilroyLaquanda Y Thelia [name] 03/29/2016, 12:44 AM

## 2016-03-29 NOTE — [facility] Group Notes (Signed)
[facility] LCSW Group Therapy Note  Date/Time: 03/29/16, 1300  Type of Therapy/Topic:  Group Therapy:  Balance in Life  Participation Level:  Pt did not attend group  Description of Group:    This group will address the concept of balance and how it feels and looks when one is unbalanced. Patients will be encouraged to process areas in their lives that are out of balance, and identify reasons for remaining unbalanced. Facilitators will guide patients utilizing problem- solving interventions to address and correct the stressor making their life unbalanced. Understanding and applying boundaries will be explored and addressed for obtaining  and maintaining a balanced life. Patients will be encouraged to explore ways to assertively make their unbalanced needs known to significant others in their lives, using other group members and facilitator for support and feedback.  Therapeutic Goals: 1. Patient will identify two or more emotions or situations they have that consume much of in their lives. 2. Patient will identify signs/triggers that life has become out of balance:  3. Patient will identify two ways to set boundaries in order to achieve balance in their lives:  4. Patient will demonstrate ability to communicate their needs through discussion and/or role plays  Summary of Patient Progress:     Therapeutic Modalities:   Cognitive Behavioral Therapy Solution-Focused Therapy Assertiveness Training  Daleen SquibbGreg Mackinley Kiehn, LCSW

## 2016-03-29 NOTE — Plan of Care (Signed)
Problem: Coping: Goal: Ability to cope will improve Outcome: Progressing Patient able to utilize coping skills at this time [place] RN   

## 2016-03-29 NOTE — Progress Notes (Signed)
D: Patient is alert and oriented on the unit this shift. Patient not attended and actively participated in groups today. Patient denies suicidal ideation, homicidal ideation, auditory or visual hallucinations at the present time.  A: Scheduled medications are administered to patient as per MD orders. Emotional support and encouragement are provided. Patient is maintained on q.15 minute safety checks. Patient is informed to notify staff with questions or concerns. R: No adverse medication reactions are noted. Patient is cooperative with medication administration  Patient is receptive, calm and cooperative on the unit at this time. Patient isolative  on the unit this shift. Patient contracts for safety at this time. Patient remains safe at this time. Anxiety 4/10 Depression 4/10

## 2016-03-29 NOTE — [facility] Group Notes (Signed)
[facility] Group Notes:  (Nursing/MHT/Case Management/Adjunct)  Date:  03/29/2016  Time:  3:48 PM  Type of Therapy:  Group Therapy  Participation Level:  Did Not Attend   Savi Lastinger De'Chelle Tremayne Sheldon 03/29/2016, 3:48 PM

## 2016-03-29 NOTE — Progress Notes (Signed)
Affect flat , Isolates to room . Limited  Involvement   With  Peers and staff. Compliant with medications . Appropriate ADL'S  D: Patient stated slept good last night .Stated appetite is good and energy level  Is normal. Stated concentration is good . Stated on Depression scale 2 , hopeless 3 and anxiety 4 .( low 0-10 high) Denies suicidal  homicidal ideations  .  No auditory hallucinations  No pain concerns . Appropriate ADL'S. Interacting with peers and staff.  Goal for today  Pray  A: Encourage patient participation with unit programming . Instruction  Given on  Medication , verbalize understanding. R: Voice no other concerns. Staff continue to monitor
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
## 2016-03-29 NOTE — [facility] Group Notes (Signed)
[facility] LCSW Group Therapy Note  (Note Entry for Lynden OxfordKadijah R. Grant, LCSWA, 03/29/2016)  Type of Therapy and Topic:  Group Therapy:  Goals Group: SMART Goals  Participation Level:  Patient did not attend group. CSW invited patient to group.   Description of Group:   The purpose of a daily goals group is to assist and guide patients in setting recovery/wellness-related goals.  The objective is to set goals as they relate to the crisis in which they were admitted. Patients will be using SMART goal modalities to set measurable goals.  Characteristics of realistic goals will be discussed and patients will be assisted in setting and processing how one will reach their goal. Facilitator will also assist patients in applying interventions and coping skills learned in psycho-education groups to the SMART goal and process how one will achieve defined goal.  Therapeutic Goals: -Patients will develop and document one goal related to or their crisis in which brought them into treatment. -Patients will be guided by LCSW using SMART goal setting modality in how to set a measurable, attainable, realistic and time sensitive goal.  -Patients will process barriers in reaching goal. -Patients will process interventions in how to overcome and successful in reaching goal.   Summary of Patient Progress:  Patient Goal: Patient did not attend group. CSW invited patient to group.    Therapeutic Modalities:   Motivational Interviewing Engineer, manufacturing systemsCognitive Behavioral Therapy Crisis Intervention Model SMART goals setting  Scribe for Group Therapy Note Harlene Petralia [name] MSW, LCSWA 03/29/2016 10:30 AM

## 2016-03-29 NOTE — Plan of Care (Signed)
Problem: Coping: Goal: Ability to cope will improve Outcome: Progressing Working on coping skills , handout given   

## 2016-03-29 NOTE — [facility] Group Notes (Signed)
[facility] Group Notes:  (Nursing/MHT/Case Management/Adjunct)  Date:  03/29/2016  Time:  9:30 PM  Type of Therapy:  Evening Wrap-up Group  Participation Level:  Did Not Attend  Participation Quality:  N/A  Affect:  N/A  Cognitive:  N/A  Insight:  None  Engagement in Group:  Did Not Attend  Modes of Intervention:  Discussion  Summary of Progress/Problems:  Jessica MorrowChelsea Nanta Huldah [name] 03/29/2016, 9:30 PM

## 2016-03-30 MED ORDER — HALOPERIDOL 1 MG PO TABS
1.0000 mg | ORAL_TABLET | Freq: Two times a day (BID) | ORAL | Status: DC
  Administered 2016-03-30 – 2016-04-06 (×12): 1 mg via ORAL
  Filled 2016-03-30 (×14): qty 1

## 2016-03-30 NOTE — [facility] Group Notes (Signed)
[facility] LCSW Group Therapy   03/30/2016 1 PM   Type of Therapy: Group Therapy   Participation Level: Pt invited but did not attend.  Participation Quality: Pt invited but did not attend.  Summary of Progress/Problems: The topic for today was feelings about relapse. Pt discussed what relapse prevention is to them and identified triggers that they are on the path to relapse. Pt processed their feeling towards relapse and was able to relate to peers. Pt discussed coping skills that can be used for relapse prevention.    Hampton AbbotKadijah Darrie Macmillan, MSW, LCSWA 03/30/2016, 2:04 PM

## 2016-03-30 NOTE — Progress Notes (Signed)
March 30, 2016.    Patient Identification: Jessica [name] MRN:  [id number] Date of Evaluation:  03/01/2016 Principal Diagnosis: Schizoaffective disorder, bipolar type [facility])  To [facility] Court:   Patient is a 41 year old Caucasian female with history of schizoaffective disorder bipolar type. She was brought in by police to [facility] emergency department on January 3rd. Per the emergency departments note patient's mother called EMS after the patient grabbed her arm and would not let go. Upon EMS arrival, the patient was standing aggressively in the doorway and saying "[place], [place]. There is too much evil in [place]". She was uncooperative with evaluation yelling occasionally. Nurses in the ER described her as agitated and refusing to dress out in his scrubs, tangential.   It has been reported patient was noncompliant with her medications.  Per chart review she was hospitalized twice last year in behavioral health [place]. Her last admission was in June 2017. She was discharged with a diagnosis of schizoaffective disorder bipolar type. During her stay in the hospital, she was placed on known emergency forced medications.   On 03/02/15 during assessment, the patient was hyperreligious. She changed her voice during assessment and stated that God talking to me. Her tone of voice was threatening and she got close to my face. The assessment and had to be terminated. Patient then refused to leave the office. Staff was called in the attempted to encourage patient to step out of the office but she refused. Eventually staff had to perform a manual hold.  Patient during assessment did not want to tell me anything about her past psychiatric history. She said that that information was personal. She did not want to tell me who her psychiatrist was or the medication she was taking. She was unwilling to tell me she was taking medications prior to admission as she had a diagnosis of bipolar disorder.     She initially refused all her medications and was started on non emergency forced medications for 2 weeks. She did not eat and drank only minimally for >7 days as she said she is fasting.  This patient has been in our psychiatric unit for 29 days. She has only minimally improved. She is withdrawn, does not interact with others or participate in any activities. During daily rounds, she only answers questions vaguely. She is still clearly very suspicious and guarded. She stays in bed all day and only leaves her room for meals and medications.  She is unlikely to comply with medications after discharge. She has a long history of noncompliance. She believes that in the hospital she has been receiving illegal drugs. Despite our efforts to educate her about the medications she is receiving, she believes she is taking different medications from the ones that are actually prescribed to her. She is currently homeless and does not have a place to go after discharge. Her mother who used to be her caregiver is afraid of her own safety as the patient was aggressive due to her lack of a adherence to medications. Patient does not have any funds to support placement. She has refused to apply for disability, as she does not have insight into the severity of her illness.  At this time, I fear that if the patient were to be discharged from the hospital she will immediately decompensate. I recommend for this patient to be transferred to [facility] for longer-term hospitalization were her severe psychiatric condition and social issues could be addressed.  Patient is currently on the  waiting list [facility].   There is also a guardianship hearing that has been is scheduled for February 14.   Patient is not stable for discharge and I recommend extending her involuntary commitment for up to 60 days.   If more information is needed about this case, please do not hesitated to contact me at  [phone].   Sincerely,   Radene JourneyAndrea Hernandez M.D. [phone] [facility]/Behavioral health Unit

## 2016-03-30 NOTE — Plan of Care (Signed)
Problem: Pain Managment: Goal: General experience of comfort will improve Outcome: Not Met (add Reason) Patient will describe satisfactory pain control <3 to 4 on rating scale of 0 to 10.

## 2016-03-30 NOTE — Progress Notes (Signed)
Patient stayed in bed except for meds and meals.Hesitant to talk to staff.Stated that she took a shower this morning that makes her feel fresh.Denies suicidal or homicidal ideations and AV hallucinations.Compliant with medications.

## 2016-03-30 NOTE — Tx Team (Signed)
Interdisciplinary Treatment and Diagnostic Plan Update  03/30/2016 Time of Session: 10:30am Jessica [name] MRN: [id number]  Principal Diagnosis: Schizoaffective disorder, bipolar type [facility])  Secondary Diagnoses: Principal Problem:   Schizoaffective disorder, bipolar type (HCC) Active Problems:   IBS (irritable bowel syndrome)   Current Medications:  Current Facility-Administered Medications  Medication Dose Route Frequency Provider Last Rate Last Dose  . acetaminophen (TYLENOL) tablet 650 mg  650 mg Oral Q6H PRN Jimmy Footman, MD      . alum & mag hydroxide-simeth (MAALOX/MYLANTA) 200-200-20 MG/5ML suspension 30 mL  30 mL Oral Q4H PRN Jimmy Footman, MD      . amantadine (SYMMETREL) capsule 100 mg  100 mg Oral BID Jimmy Footman, MD   100 mg at 03/30/16 0855  . feeding supplement (ENSURE ENLIVE) (ENSURE ENLIVE) liquid 237 mL  237 mL Oral BID BM Jimmy Footman, MD   237 mL at 03/28/16 1000  . haloperidol (HALDOL) tablet 1 mg  1 mg Oral BID Jimmy Footman, MD      . LORazepam (ATIVAN) tablet 1 mg  1 mg Oral BID Jimmy Footman, MD   1 mg at 03/30/16 0856  . magnesium hydroxide (MILK OF MAGNESIA) suspension 30 mL  30 mL Oral Daily PRN Jimmy Footman, MD   30 mL at 03/21/16 1715  . OLANZapine (ZYPREXA) tablet 30 mg  30 mg Oral QHS Jimmy Footman, MD   30 mg at 03/29/16 2123   PTA Medications: Prescriptions Prior to Admission  Medication Sig Dispense Refill Last Dose  . ARIPiprazole (ABILIFY) 5 MG tablet Take 5 mg by mouth daily.   02/29/2016 at Unknown time  . doxepin (SINEQUAN) 10 MG capsule Take 2 capsules (20 mg total) by mouth at bedtime as needed (sleep). 30 capsule 0 unknown  . hydrOXYzine (ATARAX/VISTARIL) 25 MG tablet Take 1 tablet (25 mg total) by mouth every 6 (six) hours as needed for anxiety. 30 tablet 0 unknown  . OLANZapine (ZYPREXA) 2.5 MG tablet Take 5 tablets (12.5 mg total) by mouth at  bedtime. (Patient not taking: Reported on 02/29/2016) 30 tablet 0 Not Taking at Unknown time  . OXcarbazepine (TRILEPTAL) 150 MG tablet Take 1 tablet (150 mg total) by mouth 2 (two) times daily. 60 tablet 0 02/29/2016 at Unknown time  . ziprasidone (GEODON) 20 MG capsule Take 1 capsule (20 mg total) by mouth 2 (two) times daily as needed (SEVERE ANXIETY/AGITATION). 30 capsule 0 unknown    Patient Stressors: Financial difficulties Marital or family conflict Medication change or noncompliance  Patient Strengths: Ability for Warden/ranger for treatment/growth  Treatment Modalities: Medication Management, Group therapy, Case management,  1 to 1 session with clinician, Psychoeducation, Recreational therapy.   Physician Treatment Plan for Primary Diagnosis: Schizoaffective disorder, bipolar type (HCC) Long Term Goal(s): Improvement in symptoms so as ready for discharge Improvement in symptoms so as ready for discharge   Short Term Goals: Ability to identify changes in lifestyle to reduce recurrence of condition will improve Ability to demonstrate self-control will improve Ability to identify and develop effective coping behaviors will improve Compliance with prescribed medications will improve Ability to identify triggers associated with substance abuse/mental health issues will improve Ability to identify changes in lifestyle to reduce recurrence of condition will improve Ability to verbalize feelings will improve Ability to demonstrate self-control will improve Compliance with prescribed medications will improve Ability to identify triggers associated with substance abuse/mental health issues will improve  Medication Management: Evaluate patient's response, side effects, and tolerance of medication  regimen.  Therapeutic Interventions: 1 to 1 sessions, Unit Group sessions and Medication administration.  Evaluation of Outcomes: Progressing  Physician Treatment  Plan for Secondary Diagnosis: Principal Problem:   Schizoaffective disorder, bipolar type (HCC) Active Problems:   IBS (irritable bowel syndrome)  Long Term Goal(s): Improvement in symptoms so as ready for discharge Improvement in symptoms so as ready for discharge   Short Term Goals: Ability to identify changes in lifestyle to reduce recurrence of condition will improve Ability to demonstrate self-control will improve Ability to identify and develop effective coping behaviors will improve Compliance with prescribed medications will improve Ability to identify triggers associated with substance abuse/mental health issues will improve Ability to identify changes in lifestyle to reduce recurrence of condition will improve Ability to verbalize feelings will improve Ability to demonstrate self-control will improve Compliance with prescribed medications will improve Ability to identify triggers associated with substance abuse/mental health issues will improve     Medication Management: Evaluate patient's response, side effects, and tolerance of medication regimen.  Therapeutic Interventions: 1 to 1 sessions, Unit Group sessions and Medication administration.  Evaluation of Outcomes: Progressing   RN Treatment Plan for Primary Diagnosis: Schizoaffective disorder, bipolar type (HCC) Long Term Goal(s): Knowledge of disease and therapeutic regimen to maintain health will improve  Short Term Goals: Ability to remain free from injury will improve, Ability to demonstrate self-control, Ability to disclose and discuss suicidal ideas and Compliance with prescribed medications will improve  Medication Management: RN will administer medications as ordered by provider, will assess and evaluate patient's response and provide education to patient for prescribed medication. RN will report any adverse and/or side effects to prescribing provider.  Therapeutic Interventions: 1 on 1 counseling sessions,  Psychoeducation, Medication administration, Evaluate responses to treatment, Monitor vital signs and CBGs as ordered, Perform/monitor CIWA, COWS, AIMS and Fall Risk screenings as ordered, Perform wound care treatments as ordered.  Evaluation of Outcomes: Progressing   LCSW Treatment Plan for Primary Diagnosis: Schizoaffective disorder, bipolar type (HCC) Long Term Goal(s): Safe transition to appropriate next level of care at discharge, Engage patient in therapeutic group addressing interpersonal concerns.  Short Term Goals: Engage patient in aftercare planning with referrals and resources, Increase social support, Facilitate acceptance of mental health diagnosis and concerns and Increase skills for wellness and recovery  Therapeutic Interventions: Assess for all discharge needs, 1 to 1 time with Social worker, Explore available resources and support systems, Assess for adequacy in community support network, Educate family and significant other(s) on suicide prevention, Complete Psychosocial Assessment, Interpersonal group therapy.  Evaluation of Outcomes: Progressing   Progress in Treatment: Attending groups: No. Participating in groups: No. Taking medication as prescribed: Yes. Toleration medication: Yes. Family/Significant other contact made: No, will contact:  patient continues to refuse for family consent Patient understands diagnosis: Yes.  Discussing patient identified problems/goals with staff: Yes. Medical problems stabilized or resolved: Yes. Denies suicidal/homicidal ideation: Yes. Issues/concerns per patient self-inventory: No. Other: n/a  New problem(s) identified: None identified at this time.   New Short Term/Long Term Goal(s): None identified at this time.   Discharge Plan or Barriers: Patient is on the the wait list for [facility].   Reason for Continuation of Hospitalization: Delusions  Hallucinations Medication stabilization  Estimated Length of  Stay: 7 days.   Attendees: Patient: Jessica [name] 03/30/2016 11:20 AM  Physician: Dr. Radene JourneyJayme Cloud, MD 03/30/2016 11:20 AM  Nursing: Leonia Reader, RN 03/30/2016 11:20 AM  RN Care Manager: 03/30/2016 11:20 AM  Social Worker: Lynden OxfordKadijah R. Grant. LCSWA 03/30/2016 11:20 AM  Recreational Therapist:  03/30/2016 11:20 AM  Other:  03/30/2016 11:20 AM  Other:  03/30/2016 11:20 AM  Other: 03/30/2016 11:20 AM    Scribe for Treatment Team: Arelia LongestAmaris G Richie Bonanno, LCSWA 03/30/2016 11:25 AM
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
## 2016-03-30 NOTE — Progress Notes (Signed)
Recreation Therapy Notes  Date: 02.02.18 Time: 9:30 am Location: Craft Room  Group Topic: Coping Skills  Goal Area(s) Addresses:  Patient will participate in healthy coping skill. Patient will verbalize benefit of using art as a coping skill.  Behavioral Response: Did not attend  Intervention: Coloring  Activity: Patients were given coloring sheets and were instructed to think of what emotions they were feeling and what their minds were focused on.  Education: LRT educated patients on healthy coping skills.  Education Outcome: Patient did not attend group.  Clinical Observations/Feedback: Patient did not attend group.  Jacquelynn CreeGreene,Blandina Renaldo M, LRT/CTRS 03/30/2016 10:14 AM

## 2016-03-30 NOTE — Progress Notes (Signed)
Patient remains in bed. Isolative. Did not attend group. Med compliant. No PRNs given. Malodorous. Refused shower. Denies SI/HI/AV/H. No c/o pain/discomfort noted.

## 2016-03-31 NOTE — Plan of Care (Signed)
Problem: Coping: Goal: Ability to verbalize feelings will improve Outcome: Not Progressing Pt unable to verbalize feelings this shift.

## 2016-03-31 NOTE — Plan of Care (Signed)
Problem: Education: Goal: Will be free of psychotic symptoms Outcome: Not Progressing Pt paranoid and delusional stating that "they are making me have sex for things"

## 2016-03-31 NOTE — [facility] Group Notes (Signed)
[facility] LCSW Group Therapy  03/31/2016 2:14 PM  Type of Therapy:  Group Therapy [facility].  Participation Level:  Did Not Attend patient was invited to participate in group therapy today and elected not to participate.  Leydi Winstead M 03/31/2016, 2:14 PM  

## 2016-03-31 NOTE — Progress Notes (Signed)
Patient continues to isolate to room except for meals and med pass. Poor hygiene.  Declined to take shower offered change of clothes and fresh towels given.  Continues to be delusional although is polite to this Clinical research associatewriter during medication pass. Support and encouragement offered.  Safety maintained.

## 2016-03-31 NOTE — [facility] Group Notes (Signed)
[facility] Group Notes:  (Nursing/MHT/Case Management/Adjunct)  Date:  03/31/2016  Time:  9:25 PM  Type of Therapy:  Psychoeducational Skills  Participation Level:  Did Not Attend  Summary of Progress/Problems:  Jessica MilroyLaquanda Y Alysiah [name] 03/31/2016, 9:25 PM

## 2016-03-31 NOTE — Progress Notes (Signed)
Pt isolated to room. Difficult to assess due to Pt being disorganized. She did go to medication room for evening meds which is a noted improvement from last weekend. Poor hygiene. Avoids eye contact. Mood empty and preoccupied. Encouragement and support provided. Safety maintained. Will continue to monitor.

## 2016-03-31 NOTE — [facility] Group Notes (Signed)
[facility] Group Notes:  (Nursing/MHT/Case Management/Adjunct)  Date:  03/31/2016  Time:  12:38 AM  Type of Therapy:  Psychoeducational Skills  Participation Level:  Did Not Attend  Summary of Progress/Problems:  Chancy MilroyLaquanda Y Annalyn Blecher 03/31/2016, 12:38 AM
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
## 2016-04-01 NOTE — [facility] Group Notes (Signed)
[facility] Group Notes:  (Nursing/MHT/Case Management/Adjunct)  Date:  04/01/2016  Time:  10:37 PM  Type of Therapy:  Psychoeducational Skills  Participation Level:  Did Not Attend  Summary of Progress/Problems:  Chancy MilroyLaquanda Y Zekiah Caruth 04/01/2016, 10:37 PM
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
## 2016-04-01 NOTE — [facility] Group Notes (Signed)
LCSW Therapeutic Group- Emotional regulation  Feb 4th 2018 1pm  Patient was invited to group and has not attended   Eli Lilly and CompanyClaudine Tamana Hatfield LCSW

## 2016-04-01 NOTE — Progress Notes (Signed)
Patient quiet, isolative, depressed and flat in affect. Patient avoided any interaction. She was medication compliant, denied SI/HI and contracted for safety.

## 2016-04-01 NOTE — Plan of Care (Signed)
Problem: Health Behavior/Discharge Planning: Goal: Compliance with prescribed medication regimen will improve Outcome: Progressing Patient took her medications this morning without being suspicious. Cooperative.

## 2016-04-01 NOTE — Progress Notes (Signed)
Pt appears to be declining. She has been in bed duration of shift. Refused all medications and interaction. Denies pain. Dismissive of staff when they attempt to engage pt in conversation. Encouragement offered. Safety maintained. Will continue to monitor.

## 2016-04-01 NOTE — Plan of Care (Signed)
Problem: Self-Care: Goal: Ability to participate in self-care as condition permits will improve Outcome: Not Progressing Pt not attending to hygiene.

## 2016-04-01 NOTE — Progress Notes (Signed)
Pt unwilling to participate in assessment. Isolated to room majority of shift. Suspicious of med pass and staff intentions. Stated " I shouldn't be taking this many medications". Encouragement to shower and assistance offered. Pt refused and stated " I wash my body but will not wash my hair". Pt  Would not given reason as to why and asked writer to leave her room". Avoids eye contact. Hygiene is becoming a problem for Pt. Hair is matting up. Denies pain and voices no additional concerns at this time. Safety maintained. Will continue to monitor.

## 2016-04-02 MED ORDER — ENSURE ENLIVE PO LIQD: 237.0000 mL | Freq: Three times a day (TID) | ORAL | Status: DC | PRN

## 2016-04-02 NOTE — Progress Notes (Signed)
D: Pt denies SI/HI/AVH. Pt is pleasant and cooperative, affect is bright, thoughts are organized. Patient appears less anxious, still isolates to self and not interacting with peers.   A: Pt was offered support and encouragement. Pt was given scheduled medications. Pt was encouraged to attend groups. Q 15 minute checks were done for safety.  R:Pt did not attend group. Pt is taking medication. Pt has no complaints.Pt receptive to treatment and safety maintained on unit.
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
## 2016-04-02 NOTE — Progress Notes (Signed)
Recreation Therapy Notes  Date: 02.05.18 Time: 9:30 am Location: Craft Room  Group Topic: Self-expression  Goal Area(s) Addresses:  Patient will effectively use art as a means of self-expression. Patient will be able to identify one emotion experienced during group session.  Behavioral Response: Did not attend  Intervention: Two Faces of Me  Activity: Patients were given blank face worksheets and were instructed to draw a line down the middle. On one side, patients were instructed to draw or write how they felt when they were admitted to the hospital. On the other side, patients were instructed to draw or write how they want to feel when they are d/c.  Education: LRT educated patients on different forms of self-expression.  Education Outcome: Patient did not attend group.   Clinical Observations/Feedback: Patient did not attend group.  Jacquelynn CreeGreene,Seidy Labreck M, LRT/CTRS 04/02/2016 10:04 AM

## 2016-04-02 NOTE — Progress Notes (Signed)
Pt pleasant and cooperative. Medication compliant. Does not attend group. Pt did drink ensure this morning and ate meals. Denies SI, HI, AVH. Pt did engage in conversation.  Encouragement and support offered. Medications given as prescribed. Safety checks maintained.  Pt receptive and remains safe on unit with q 15 min checks.

## 2016-04-03 NOTE — Progress Notes (Signed)
Patient with flat affect, cooperative behavior. Brief eye contact, quiet speech. Minimal interaction with peers. Guarded when staff initiates interaction.  No SI/HI at this time. Therapy groups encouraged. Patient states her goal today is "to pray and meditate". Patient with poor adls, states I showered today". Safety maintained.

## 2016-04-03 NOTE — Progress Notes (Signed)
Recreation Therapy Notes  Date: 02.06.18 Time: 9:30 am Location: Craft Room  Group Topic: Self-expression  Goal Area(s) Addresses:  Patient will identify one color per emotion listed on wheel. Patient will verbalize benefit of using art as a means of self-expression. Patient will verbalize one emotion experienced during group. Patient will be educated on other forms of self-expression.  Behavioral Response: Did not attend  Intervention: Emotion Wheel  Activity: Patients were given an Emotion Wheel worksheet and were instructed to pick a color for each emotion listed on the wheel and to color in the section.  Education: LRT educated patients on other forms of self-expression.  Education Outcome: Patient did not attend group.  Clinical Observations/Feedback: Patient did not attend group.  Jacquelynn CreeGreene,Deshunda Thackston M, LRT/CTRS 04/03/2016 10:12 AM

## 2016-04-03 NOTE — [facility] Group Notes (Signed)
Goals Group Date/Time: 04/03/2016 9:00 AM Type of Therapy and Topic: Group Therapy: Goals Group: SMART Goals   Participation Level: PT did not attend group.  Description of Group:    The purpose of a daily goals group is to assist and guide patients in setting recovery/wellness-related goals. The objective is to set goals as they relate to the crisis in which they were admitted. Patients will be using SMART goal modalities to set measurable goals. Characteristics of realistic goals will be discussed and patients will be assisted in setting and processing how one will reach their goal. Facilitator will also assist patients in applying interventions and coping skills learned in psycho-education groups to the SMART goal and process how one will achieve defined goal.   Therapeutic Goals:   -Patients will develop and document one goal related to or their crisis in which brought them into treatment.  -Patients will be guided by LCSW using SMART goal setting modality in how to set a measurable, attainable, realistic and time sensitive goal.  -Patients will process barriers in reaching goal.  -Patients will process interventions in how to overcome and successful in reaching goal.   Patient's Goal:   Therapeutic Modalities:  Motivational Interviewing  Cognitive Behavioral Therapy  Crisis Intervention Model  SMART goals setting   Greg Zyria Fiscus, LCSW   

## 2016-04-03 NOTE — [facility] Group Notes (Signed)
[facility] Group Notes:  (Nursing/MHT/Case Management/Adjunct)  Date:  04/03/2016  Time:  4:07 PM  Type of Therapy:  Psychoeducational Skills  Participation Level:  Did Not Attend  Lynelle SmokeCara [facility] Center[facility] 04/03/2016, 4:07 PM
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
## 2016-04-04 NOTE — [facility] Group Notes (Signed)
[facility] Group Notes:  (Nursing/MHT/Case Management/Adjunct)  Date:  04/04/2016  Time:  7:01 AM  Type of Therapy:  Psychoeducational Skills  Participation Level:  Did Not Attend  Participation QualitSummary of Progress/Problems:  Jessica NeerJackie L [name] 04/04/2016, 7:01 AM

## 2016-04-04 NOTE — [facility] Group Notes (Signed)
[facility] LCSW Group Therapy  04/04/2016 2:08 PM  Type of Therapy:  Group Therapy  Participation Level:  Patient did not attend group. CSW invited patient to group.   Summary of Progress/Problems: Emotional Regulation: Patients will identify both negative and positive emotions. They will discuss emotions they have difficulty regulating and how they impact their lives. Patients will be asked to identify healthy coping skills to combat unhealthy reactions to negative emotions.   Jessica [name] [name] MSW, LCSWA 04/04/2016, 2:09 PM

## 2016-04-04 NOTE — Progress Notes (Signed)
D: Patient is alert and oriented on the unit this shift. Patient states she attended and actively participated in groups today. Patient denies suicidal ideation, homicidal ideation, auditory or visual hallucinations at the present time.  A: Scheduled medications are administered to patient as per MD orders. Emotional support and encouragement are provided. Patient is maintained on q.15 minute safety checks. Patient is informed to notify staff with questions or concerns. R: No adverse medication reactions are noted. Patient is cooperative with medication administration and treatment plan today. Patient is receptive, calm and cooperative on the unit at this time. Patient does not  Interact  with others on the unit this shift. Patient contracts for safety at this time. Patient remains safe at this time. Depression 2/10 Anxiety 2/10 Denies pain

## 2016-04-04 NOTE — Progress Notes (Signed)
Patient with flat affect, brief eye contact, quiet speech when staff initiates interaction. No SI/HI at this time. Patient cooperative with meals and meds. Therapy groups encouraged, patient is isolative. Completes daily audit sheet with mindfulness as her goal. Patient with poort adls. Brushes teeth and states she has showered. Hair is wet, patient states she wets her hair and does not use the shampoo because it makes her hair greasy. Safety maintained.

## 2016-04-04 NOTE — Tx Team (Signed)
Interdisciplinary Treatment and Diagnostic Plan Update  04/04/2016 Time of Session: 10:30am [name] MRN: [id number]  Principal Diagnosis: Schizoaffective disorder, bipolar type [facility])  Secondary Diagnoses: Principal Problem:   Schizoaffective disorder, bipolar type (HCC) Active Problems:   IBS (irritable bowel syndrome)   Current Medications:  Current Facility-Administered Medications  Medication Dose Route Frequency Provider Last Rate Last Dose  . acetaminophen (TYLENOL) tablet 650 mg  650 mg Oral Q6H PRN Jimmy Footman, MD      . alum & mag hydroxide-simeth (MAALOX/MYLANTA) 200-200-20 MG/5ML suspension 30 mL  30 mL Oral Q4H PRN Jimmy Footman, MD      . amantadine (SYMMETREL) capsule 100 mg  100 mg Oral BID Jimmy Footman, MD   100 mg at 04/04/16 0737  . feeding supplement (ENSURE ENLIVE) (ENSURE ENLIVE) liquid 237 mL  237 mL Oral TID PRN Jimmy Footman, MD      . haloperidol (HALDOL) tablet 1 mg  1 mg Oral BID Jimmy Footman, MD   1 mg at 04/04/16 0737  . LORazepam (ATIVAN) tablet 1 mg  1 mg Oral BID Jimmy Footman, MD   1 mg at 04/04/16 0737  . magnesium hydroxide (MILK OF MAGNESIA) suspension 30 mL  30 mL Oral Daily PRN Jimmy Footman, MD   30 mL at 03/21/16 1715  . OLANZapine (ZYPREXA) tablet 30 mg  30 mg Oral QHS Jimmy Footman, MD   30 mg at 04/03/16 2122   PTA Medications: Prescriptions Prior to Admission  Medication Sig Dispense Refill Last Dose  . ARIPiprazole (ABILIFY) 5 MG tablet Take 5 mg by mouth daily.   02/29/2016 at Unknown time  . doxepin (SINEQUAN) 10 MG capsule Take 2 capsules (20 mg total) by mouth at bedtime as needed (sleep). 30 capsule 0 unknown  . hydrOXYzine (ATARAX/VISTARIL) 25 MG tablet Take 1 tablet (25 mg total) by mouth every 6 (six) hours as needed for anxiety. 30 tablet 0 unknown  . OLANZapine (ZYPREXA) 2.5 MG tablet Take 5 tablets (12.5 mg total) by mouth at  bedtime. (Patient not taking: Reported on 02/29/2016) 30 tablet 0 Not Taking at Unknown time  . OXcarbazepine (TRILEPTAL) 150 MG tablet Take 1 tablet (150 mg total) by mouth 2 (two) times daily. 60 tablet 0 02/29/2016 at Unknown time  . ziprasidone (GEODON) 20 MG capsule Take 1 capsule (20 mg total) by mouth 2 (two) times daily as needed (SEVERE ANXIETY/AGITATION). 30 capsule 0 unknown    Patient Stressors: Financial difficulties Marital or family conflict Medication change or noncompliance  Patient Strengths: Ability for Warden/ranger for treatment/growth  Treatment Modalities: Medication Management, Group therapy, Case management,  1 to 1 session with clinician, Psychoeducation, Recreational therapy.   Physician Treatment Plan for Primary Diagnosis: Schizoaffective disorder, bipolar type (HCC) Long Term Goal(s): Improvement in symptoms so as ready for discharge Improvement in symptoms so as ready for discharge   Short Term Goals: Ability to identify changes in lifestyle to reduce recurrence of condition will improve Ability to demonstrate self-control will improve Ability to identify and develop effective coping behaviors will improve Compliance with prescribed medications will improve Ability to identify triggers associated with substance abuse/mental health issues will improve Ability to identify changes in lifestyle to reduce recurrence of condition will improve Ability to verbalize feelings will improve Ability to demonstrate self-control will improve Compliance with prescribed medications will improve Ability to identify triggers associated with substance abuse/mental health issues will improve  Medication Management: Evaluate patient's response, side effects, and tolerance of medication  regimen.  Therapeutic Interventions: 1 to 1 sessions, Unit Group sessions and Medication administration.  Evaluation of Outcomes: Progressing  Physician Treatment  Plan for Secondary Diagnosis: Principal Problem:   Schizoaffective disorder, bipolar type (HCC) Active Problems:   IBS (irritable bowel syndrome)  Long Term Goal(s): Improvement in symptoms so as ready for discharge Improvement in symptoms so as ready for discharge   Short Term Goals: Ability to identify changes in lifestyle to reduce recurrence of condition will improve Ability to demonstrate self-control will improve Ability to identify and develop effective coping behaviors will improve Compliance with prescribed medications will improve Ability to identify triggers associated with substance abuse/mental health issues will improve Ability to identify changes in lifestyle to reduce recurrence of condition will improve Ability to verbalize feelings will improve Ability to demonstrate self-control will improve Compliance with prescribed medications will improve Ability to identify triggers associated with substance abuse/mental health issues will improve     Medication Management: Evaluate patient's response, side effects, and tolerance of medication regimen.  Therapeutic Interventions: 1 to 1 sessions, Unit Group sessions and Medication administration.  Evaluation of Outcomes: Progressing   RN Treatment Plan for Primary Diagnosis: Schizoaffective disorder, bipolar type (HCC) Long Term Goal(s): Knowledge of disease and therapeutic regimen to maintain health will improve  Short Term Goals: Ability to remain free from injury will improve, Ability to demonstrate self-control, Ability to disclose and discuss suicidal ideas and Compliance with prescribed medications will improve  Medication Management: RN will administer medications as ordered by provider, will assess and evaluate patient's response and provide education to patient for prescribed medication. RN will report any adverse and/or side effects to prescribing provider.  Therapeutic Interventions: 1 on 1 counseling sessions,  Psychoeducation, Medication administration, Evaluate responses to treatment, Monitor vital signs and CBGs as ordered, Perform/monitor CIWA, COWS, AIMS and Fall Risk screenings as ordered, Perform wound care treatments as ordered.  Evaluation of Outcomes: Progressing   LCSW Treatment Plan for Primary Diagnosis: Schizoaffective disorder, bipolar type (HCC) Long Term Goal(s): Safe transition to appropriate next level of care at discharge, Engage patient in therapeutic group addressing interpersonal concerns.  Short Term Goals: Engage patient in aftercare planning with referrals and resources, Increase social support, Facilitate acceptance of mental health diagnosis and concerns and Increase skills for wellness and recovery  Therapeutic Interventions: Assess for all discharge needs, 1 to 1 time with Social worker, Explore available resources and support systems, Assess for adequacy in community support network, Educate family and significant other(s) on suicide prevention, Complete Psychosocial Assessment, Interpersonal group therapy.  Evaluation of Outcomes: Progressing   Progress in Treatment: Attending groups: No. Participating in groups: No. Taking medication as prescribed: Yes. Toleration medication: Yes. Family/Significant other contact made: No, will contact:  patient continues to refuse for family consent Patient understands diagnosis: Yes.  Discussing patient identified problems/goals with staff: Yes. Medical problems stabilized or resolved: Yes. Denies suicidal/homicidal ideation: Yes. Issues/concerns per patient self-inventory: No. Other: n/a  New problem(s) identified: None identified at this time.   New Short Term/Long Term Goal(s): None identified at this time.   Discharge Plan or Barriers: [facility]   Reason for Continuation of Hospitalization: Delusions  Hallucinations Medication stabilization  Estimated Length of  Stay: 7 days.   Attendees: Patient: Jessica ChaletJennifer L Veale 04/04/2016 11:55 AM  Physician: Dr. Radene JourneyAndrea HernandezJayme Cloud- Gonzalez, MD 04/04/2016 11:55 AM  Nursing: Elenore PaddyJennifer Morrow, RN 04/04/2016 11:55 AM  RN Care Manager: 04/04/2016 11:55 AM  Social Worker: Lynden Oxford. LCSWA 04/04/2016 11:55 AM  Recreational Therapist: Hershal Coria, LRT, CTRS  04/04/2016 11:55 AM    Scribe for Treatment Team: Lynden Oxford, LCSWA 04/04/2016 11:55 AM

## 2016-04-04 NOTE — Progress Notes (Signed)
Recreation Therapy Notes  Date: 02.07.18 Time: 9:30 am Location: Craft Room  Group Topic: Self-esteem  Goal Area(s) Addresses:  Patient will identify at least one positive trait about self. Patient will verbalize benefit of having healthy self-esteem.  Behavioral Response: Did not attend group.  Intervention: I Am  Activity: Patients were given a worksheet with the letter I on it and were instructed to write as many positive traits about themselves inside the letter.  Education: LRT educated patients on ways they can increase their self-esteem.  Education Outcome: Patient did not attend group.   Clinical Observations/Feedback: Patient did not attend group.   Jacquelynn CreeGreene,Sharni Negron M, LRT/CTRS 04/04/2016 10:10 AM

## 2016-04-04 NOTE — [facility] Group Notes (Signed)
[facility] Group Notes:  (Nursing/MHT/Case Management/Adjunct)  Date:  04/04/2016  Time:  9:54 PM  Type of Therapy:  Evening Wrap-up Group  Participation Level:  Did Not Attend  Participation Quality:  N/A  Affect:  N/A  Cognitive:  N/A  Insight:  None  Engagement in Group:  Did Not Attend  Modes of Intervention:  Discussion  Summary of Progress/Problems:  Jessica MorrowChelsea [name] Jessica [name] 04/04/2016, 9:54 PM

## 2016-04-04 NOTE — Plan of Care (Signed)
Problem: Coping: Goal: Ability to cope will improve Outcome: Progressing Patient has not caused any disturbances on the unit and utilizes coping skills [place] RN
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
## 2016-04-05 NOTE — [facility] Group Notes (Signed)
[facility] Group Notes:  (Nursing/MHT/Case Management/Adjunct)  Date:  04/05/2016  Time:  4:00 PM  Type of Therapy:  Psychoeducational Skills  Participation Level:  Did Not Attend    Jessica Farberamela M Moses Ellison 04/05/2016, 4:00 PM

## 2016-04-05 NOTE — Progress Notes (Signed)
Patient is polite.  Keeps eyes downcast.  Answers yes and know questions.  Verbalized that she is hearing voices but they are low.  When asked if she can understand what they are saying just states "they are low"  Forwards little.  Isolates to room.  No group attendance.  Medication compliant.  Medicated x 1 for constipation with poor results.  Will continue to monitor. Support and encouragement offered.  Safety maintained.

## 2016-04-05 NOTE — Progress Notes (Signed)
Recreation Therapy Notes  Date: 02.08.18 Time: 9:30 am Location: Craft Room  Group Topic: Leisure Education  Goal Area(s) Addresses:  Patient will identify things they are grateful for. Patient will identify how being grateful can influence decision making.  Behavioral Response: Did not attend  Intervention: Grateful Wheel  Activity: Patients were given an I Am Grateful For worksheet and were instructed to write things they are grateful for under each category.  Education: LRT educated patients on leisure.  Education Outcome: Patient did not attend group.   Clinical Observations/Feedback: Patient did not attend group.  Jacquelynn CreeGreene,Helayne Metsker M, LRT/CTRS 04/05/2016 10:18 AM
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
## 2016-04-05 NOTE — [facility] Group Notes (Signed)
[facility] LCSW Group Therapy  04/05/2016 1:48 PM  Type of Therapy:  Group Therapy  Participation Level:  Patient did not attend group. CSW invited patient to group.   Summary of Progress/Problems: Balance in life: Patients will discuss the concept of balance and how it looks and feels to be unbalanced. Pt will identify areas in their life that is unbalanced and ways to become more balanced. They discussed what aspects in their lives has influenced their self care. Patients also discussed self care in the areas of self regulation/control, hygiene/appearance, sleep/relaxation, healthy leisure, healthy eating habits, exercise, inner peace/spirituality, self improvement, sobriety, and health management. They were challenged to identify changes that are needed in order to improve self care.  [name] [name] MSW, LCSWA 04/05/2016, 1:48 PM

## 2016-04-05 NOTE — Plan of Care (Signed)
Problem: Coping: Goal: Ability to verbalize feelings will improve Outcome: Not Progressing Forwards little.  Answers yes and no questions.

## 2016-04-05 NOTE — [facility] Group Notes (Signed)
[facility] LCSW Group Therapy Note  Type of Therapy and Topic:  Group Therapy:  Goals Group: SMART Goals  Participation Level:  Patient did not attend group. CSW invited patient to group.   Description of Group:   The purpose of a daily goals group is to assist and guide patients in setting recovery/wellness-related goals.  The objective is to set goals as they relate to the crisis in which they were admitted. Patients will be using SMART goal modalities to set measurable goals.  Characteristics of realistic goals will be discussed and patients will be assisted in setting and processing how one will reach their goal. Facilitator will also assist patients in applying interventions and coping skills learned in psycho-education groups to the SMART goal and process how one will achieve defined goal.  Therapeutic Goals: -Patients will develop and document one goal related to or their crisis in which brought them into treatment. -Patients will be guided by LCSW using SMART goal setting modality in how to set a measurable, attainable, realistic and time sensitive goal.  -Patients will process barriers in reaching goal. -Patients will process interventions in how to overcome and successful in reaching goal.   Summary of Patient Progress:  Patient Goal: Patient did not attend group. CSW invited patient to group.    Therapeutic Modalities:   Motivational Interviewing Engineer, manufacturing systemsCognitive Behavioral Therapy Crisis Intervention Model SMART goals setting  Raidyn Breiner [name] MSW, LCSWA 04/05/2016 10:08 AM

## 2016-04-06 MED ORDER — HALOPERIDOL 2 MG PO TABS
2.0000 mg | ORAL_TABLET | Freq: Every day | ORAL | Status: DC
  Administered 2016-04-06 – 2016-04-15 (×10): 2 mg via ORAL
  Filled 2016-04-06 (×11): qty 1

## 2016-04-06 MED ORDER — AMANTADINE HCL 100 MG PO CAPS
100.0000 mg | ORAL_CAPSULE | Freq: Two times a day (BID) | ORAL | Status: DC
  Administered 2016-04-06 – 2016-04-20 (×28): 100 mg via ORAL
  Filled 2016-04-06 (×30): qty 1

## 2016-04-06 MED ORDER — OLANZAPINE 10 MG PO TABS
30.0000 mg | ORAL_TABLET | Freq: Every day | ORAL | Status: DC
  Administered 2016-04-06 – 2016-04-12 (×7): 30 mg via ORAL
  Filled 2016-04-06 (×7): qty 3

## 2016-04-06 MED ORDER — OLANZAPINE 10 MG IM SOLR: 10.0000 mg | Freq: Every day | INTRAMUSCULAR | Status: DC

## 2016-04-06 MED ORDER — HALOPERIDOL 1 MG PO TABS
1.0000 mg | ORAL_TABLET | Freq: Every day | ORAL | Status: DC
  Administered 2016-04-07 – 2016-04-16 (×10): 1 mg via ORAL
  Filled 2016-04-06 (×2): qty 1
  Filled 2016-04-06: qty 11
  Filled 2016-04-06 (×8): qty 1

## 2016-04-06 MED ORDER — LORAZEPAM 0.5 MG PO TABS
0.5000 mg | ORAL_TABLET | Freq: Two times a day (BID) | ORAL | Status: DC
  Administered 2016-04-06 – 2016-04-09 (×6): 0.5 mg via ORAL
  Filled 2016-04-06 (×6): qty 1

## 2016-04-06 NOTE — Plan of Care (Signed)
Problem: Activity: Goal: Risk for activity intolerance will decrease Outcome: Not Progressing Pt isolative to room, stays in her bed except during meal/med times. Support and encouragement provided, pt continues to refuse.

## 2016-04-06 NOTE — Tx Team (Signed)
Interdisciplinary Treatment and Diagnostic Plan Update  04/06/2016 Time of Session: 10:30am Jessica [name] MRN: [id number]  Principal Diagnosis: Schizoaffective disorder, bipolar type [facility])  Secondary Diagnoses: Principal Problem:   Schizoaffective disorder, bipolar type (HCC) Active Problems:   IBS (irritable bowel syndrome)   Current Medications:  Current Facility-Administered Medications  Medication Dose Route Frequency Provider Last Rate Last Dose  . acetaminophen (TYLENOL) tablet 650 mg  650 mg Oral Q6H PRN Jimmy Footman, MD      . alum & mag hydroxide-simeth (MAALOX/MYLANTA) 200-200-20 MG/5ML suspension 30 mL  30 mL Oral Q4H PRN Jimmy Footman, MD      . amantadine (SYMMETREL) capsule 100 mg  100 mg Oral BID Jimmy Footman, MD      . feeding supplement (ENSURE ENLIVE) (ENSURE ENLIVE) liquid 237 mL  237 mL Oral TID PRN Jimmy Footman, MD      . Melene Muller ON 04/07/2016] haloperidol (HALDOL) tablet 1 mg  1 mg Oral Daily Jimmy Footman, MD      . haloperidol (HALDOL) tablet 2 mg  2 mg Oral QHS Jimmy Footman, MD      . LORazepam (ATIVAN) tablet 0.5 mg  0.5 mg Oral BID Jimmy Footman, MD      . magnesium hydroxide (MILK OF MAGNESIA) suspension 30 mL  30 mL Oral Daily PRN Jimmy Footman, MD   30 mL at 04/05/16 1150  . OLANZapine (ZYPREXA) injection 10 mg  10 mg Intramuscular QHS Jimmy Footman, MD       Or  . OLANZapine (ZYPREXA) tablet 30 mg  30 mg Oral QHS Jimmy Footman, MD       PTA Medications: Prescriptions Prior to Admission  Medication Sig Dispense Refill Last Dose  . ARIPiprazole (ABILIFY) 5 MG tablet Take 5 mg by mouth daily.   02/29/2016 at Unknown time  . doxepin (SINEQUAN) 10 MG capsule Take 2 capsules (20 mg total) by mouth at bedtime as needed (sleep). 30 capsule 0 unknown  . hydrOXYzine (ATARAX/VISTARIL) 25 MG tablet Take 1 tablet (25 mg total) by mouth every 6  (six) hours as needed for anxiety. 30 tablet 0 unknown  . OLANZapine (ZYPREXA) 2.5 MG tablet Take 5 tablets (12.5 mg total) by mouth at bedtime. (Patient not taking: Reported on 02/29/2016) 30 tablet 0 Not Taking at Unknown time  . OXcarbazepine (TRILEPTAL) 150 MG tablet Take 1 tablet (150 mg total) by mouth 2 (two) times daily. 60 tablet 0 02/29/2016 at Unknown time  . ziprasidone (GEODON) 20 MG capsule Take 1 capsule (20 mg total) by mouth 2 (two) times daily as needed (SEVERE ANXIETY/AGITATION). 30 capsule 0 unknown    Patient Stressors: Financial difficulties Marital or family conflict Medication change or noncompliance  Patient Strengths: Ability for Warden/ranger for treatment/growth  Treatment Modalities: Medication Management, Group therapy, Case management,  1 to 1 session with clinician, Psychoeducation, Recreational therapy.   Physician Treatment Plan for Primary Diagnosis: Schizoaffective disorder, bipolar type (HCC) Long Term Goal(s): Improvement in symptoms so as ready for discharge Improvement in symptoms so as ready for discharge   Short Term Goals: Ability to identify changes in lifestyle to reduce recurrence of condition will improve Ability to demonstrate self-control will improve Ability to identify and develop effective coping behaviors will improve Compliance with prescribed medications will improve Ability to identify triggers associated with substance abuse/mental health issues will improve Ability to identify changes in lifestyle to reduce recurrence of condition will improve Ability to verbalize feelings will improve Ability to demonstrate self-control  will improve Compliance with prescribed medications will improve Ability to identify triggers associated with substance abuse/mental health issues will improve  Medication Management: Evaluate patient's response, side effects, and tolerance of medication regimen.  Therapeutic  Interventions: 1 to 1 sessions, Unit Group sessions and Medication administration.  Evaluation of Outcomes: Progressing  Physician Treatment Plan for Secondary Diagnosis: Principal Problem:   Schizoaffective disorder, bipolar type (HCC) Active Problems:   IBS (irritable bowel syndrome)  Long Term Goal(s): Improvement in symptoms so as ready for discharge Improvement in symptoms so as ready for discharge   Short Term Goals: Ability to identify changes in lifestyle to reduce recurrence of condition will improve Ability to demonstrate self-control will improve Ability to identify and develop effective coping behaviors will improve Compliance with prescribed medications will improve Ability to identify triggers associated with substance abuse/mental health issues will improve Ability to identify changes in lifestyle to reduce recurrence of condition will improve Ability to verbalize feelings will improve Ability to demonstrate self-control will improve Compliance with prescribed medications will improve Ability to identify triggers associated with substance abuse/mental health issues will improve     Medication Management: Evaluate patient's response, side effects, and tolerance of medication regimen.  Therapeutic Interventions: 1 to 1 sessions, Unit Group sessions and Medication administration.  Evaluation of Outcomes: Progressing   RN Treatment Plan for Primary Diagnosis: Schizoaffective disorder, bipolar type (HCC) Long Term Goal(s): Knowledge of disease and therapeutic regimen to maintain health will improve  Short Term Goals: Ability to remain free from injury will improve, Ability to demonstrate self-control, Ability to disclose and discuss suicidal ideas and Compliance with prescribed medications will improve  Medication Management: RN will administer medications as ordered by provider, will assess and evaluate patient's response and provide education to patient for prescribed  medication. RN will report any adverse and/or side effects to prescribing provider.  Therapeutic Interventions: 1 on 1 counseling sessions, Psychoeducation, Medication administration, Evaluate responses to treatment, Monitor vital signs and CBGs as ordered, Perform/monitor CIWA, COWS, AIMS and Fall Risk screenings as ordered, Perform wound care treatments as ordered.  Evaluation of Outcomes: Progressing   LCSW Treatment Plan for Primary Diagnosis: Schizoaffective disorder, bipolar type (HCC) Long Term Goal(s): Safe transition to appropriate next level of care at discharge, Engage patient in therapeutic group addressing interpersonal concerns.  Short Term Goals: Engage patient in aftercare planning with referrals and resources, Increase social support, Facilitate acceptance of mental health diagnosis and concerns and Increase skills for wellness and recovery  Therapeutic Interventions: Assess for all discharge needs, 1 to 1 time with Social worker, Explore available resources and support systems, Assess for adequacy in community support network, Educate family and significant other(s) on suicide prevention, Complete Psychosocial Assessment, Interpersonal group therapy.  Evaluation of Outcomes: Progressing   Progress in Treatment: Attending groups: No. Participating in groups: No. Taking medication as prescribed: Yes. Toleration medication: Yes. Family/Significant other contact made: No, will contact:  patient refused for family contact.  Patient understands diagnosis: Yes. Discussing patient identified problems/goals with staff: Yes. Medical problems stabilized or resolved: Yes. Denies suicidal/homicidal ideation: Yes. Issues/concerns per patient self-inventory: No. Other: n/a  New problem(s) identified: None identified at this time.   New Short Term/Long Term Goal(s): None identified at this time.   Discharge Plan or Barriers: Patient is on wait list for [facility].    Reason for Continuation of Hospitalization: Delusions  Depression Hallucinations Medication stabilization  Estimated Length of Stay: 7 days.   Attendees: Patient: Jessica [name]  04/06/2016 1:49 PM  Physician: Dr. Radene JourneyAndrea HernandezJayme Cloud- Gonzalez, MD 04/06/2016 1:49 PM  Nursing: Leonia ReaderPhyllis Cobb, RN 04/06/2016 1:49 PM  RN Care Manager: 04/06/2016 1:49 PM  Social Worker: Fredrich BirksAmaris [name] MSW, LCSWA 04/06/2016 1:49 PM  Recreational Therapist: Jacquelynn CreeElizabeth M. Greene, LRT/CTRS 04/06/2016 1:49 PM  Other:  04/06/2016 1:49 PM  Other:  04/06/2016 1:49 PM  Other: 04/06/2016 1:49 PM    Scribe for Treatment Team: Arelia LongestAmaris G Nikholas Geffre, LCSWA 04/06/2016 1:53 PM

## 2016-04-06 NOTE — Plan of Care (Signed)
Problem: Health Behavior/Discharge Planning: Goal: Compliance with prescribed medication regimen will improve Outcome: Progressing Patient compliant with medication
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
## 2016-04-06 NOTE — Progress Notes (Signed)
Pt continues to come out only for meals/meds. Isolates to room. Does not attend group. Poor hygiene, disheveled. Denies SI/HI/AVH. Poor eye contact, looks down when approaching others. Will not engage in much conversation. Forwards little. Compliant with medications this morning without argument. No improvement noted.  Support and encouragement provided. Medications administered with education. Safety maintained with every 15 minute checks. Will continue to monitor.

## 2016-04-06 NOTE — [facility] Group Notes (Signed)
[facility] LCSW Group Therapy   04/06/2016 1 PM   Type of Therapy: Group Therapy   Participation Level: Pt invited but did not attend.   Participation Quality: Pt invited but did not attend.   Summary of Progress/Problems: The topic for today was feelings about relapse. Pt discussed what relapse prevention is to them and identified triggers that they are on the path to relapse. Pt processed their feeling towards relapse and was able to relate to peers. Pt discussed coping skills that can be used for relapse prevention.    Hampton AbbotKadijah Joselyne Spake, MSW, LCSWA 04/06/2016, 2:11 PM

## 2016-04-06 NOTE — Progress Notes (Signed)
D: Pt denies SI/HI/AVH. Pt is alert and oriented x 3  less psychosis noted, denies pain or discomfort. Pt stated he feels better from doing ECT, she appears less anxious, isolates to self, not interacting with peers.  A: Pt was offered support and encouragement. Pt was offered scheduled medications. Pt was encouraged to attend groups. Q 15 minute checks were done for safety.  R:Pt did not attend group  Pt did not take evening  medication. Pt has no complaints.Pt was not receptive to treatment. Safety maintained on unit.

## 2016-04-06 NOTE — [facility] Group Notes (Signed)
[facility] Group Notes:  (Nursing/MHT/Case Management/Adjunct)  Date:  04/06/2016  Time:  12:28 AM  Type of Therapy:  Psychoeducational Skills  Participation Level:  Did Not Attend  Participation Quality:   Summary of Progress/Problems:  Mayra NeerJackie L Sufyaan Palma 04/06/2016, 12:28 AM

## 2016-04-06 NOTE — Progress Notes (Signed)
Recreation Therapy Notes  Date: 02.09.18 Time: 9:30 am Location: Craft Room  Group Topic: Coping Skills  Goal Area(s) Addresses:  Patient will participate in healthy coping skill. Patient will verbalize benefit of using art as a coping skill.  Behavioral Response: Did not attend  Intervention: Coloring  Activity: Patients were given coloring sheets to color and were instructed to think of what emotions they were feeling and what their minds were focused on.  Education: LRT educated patients on healthy coping skills.  Education Outcome: Patient did not attend group.  Clinical Observations/Feedback: Patient did not attend group.  Heliodoro Domagalski M, LRT/CTRS 04/06/2016 10:19 AM 

## 2016-04-06 NOTE — Progress Notes (Signed)
D: Pt denies SI/HI/AVH. Pt is pleasant and cooperative, affect is bright, thoughts are organized. Patient is not irritable with staff and appears less anxious.. Patient had a visitor in the evening; she interacted appropriately no bizarre behavior noted.  A: Pt was offered support and encouragement. Pt was given scheduled medications. Pt was encouraged to attend groups. Q 15 minute checks were done for safety.  R:Pt did not attend evening group. Pt is complaint with medication. Pt has no complaints.Pt is receptive to treatment and safety maintained on unit.

## 2016-04-06 NOTE — Progress Notes (Signed)
[facility] Opinion Progress Note for Medication Administration to Non-consenting Patients (For Involuntarily Committed Patients)  Patient: Jessica ChaletJennifer L Littman Date of Birth: 16109611/16/1977 MRN: [id number]  Reason for the Medication: The patient, without the benefit of the specific treatment measure, is incapable of participating in any available treatment plan that will give the patient a realistic opportunity of improving the patient's condition. There is, without the benefit of the specific treatment measure, a significant possibility that the patient will harm self or others before improvement of the patient's condition is realized.  Consideration of Side Effects: Consideration of the side effects related to the medication plan has been given.  Rationale for Medication Administration: The patient is unable to consider her best interest due to psychosis.    Kristine LineaJolanta Janita Camberos, MD 04/06/16  10:57 AM   This documentation is good for (7) seven days from the date of the MD signature. New documentation must be completed every seven (7) days with detailed justification in the medical record if the patient requires continued non-emergent administration of psychotropic medications.

## 2016-04-07 DIAGNOSIS — K589 Irritable bowel syndrome without diarrhea: Secondary | ICD-10-CM

## 2016-04-07 DIAGNOSIS — Z79899 Other long term (current) drug therapy: Secondary | ICD-10-CM
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
## 2016-04-07 NOTE — [facility] Group Notes (Signed)
[facility] LCSW Group Therapy  04/07/2016 3:00 PM  Type of Therapy:  Group Therapy  Participation Level:  Patient did not attend group. CSW invited patient to group.   Summary of Progress/Problems: Self esteem: Patients discussed self esteem and how it impacts them. They discussed what aspects in their lives has influenced their self esteem. They were challenged to identify changes that are needed in order to improve self esteem. Patients participated in activity where they had to identify positive adjectives they felt described their personality. Patients shared with the group on the following areas: Things I am good at, What I like about my appearance, I've helped others by, What I value the most, compliments I have received, challenges I have overcome, thing that make me unique, and Times I've made others happy.    Iszabella Hebenstreit G. Castin Donaghue MSW, LCSWA 04/07/2016, 3:00 PM

## 2016-04-07 NOTE — Progress Notes (Signed)
[name] still remains paranoid, she spends most of the time in her room and she refuses to see visitors. She was medication compliant and seemed a little more logical on approach. She had no interaction with peers. She appears to be resting in bed quietly.

## 2016-04-07 NOTE — Plan of Care (Signed)
Problem: Education: Goal: Will be free of psychotic symptoms Outcome: Not Progressing Pt brighter today. Isolative to room. Ritualistic with interactions.

## 2016-04-07 NOTE — Progress Notes (Signed)
Awake, alert today. Brightens on approach, smiles. Noted to be ritualistic with her approach this morning. Less anxious, pleasant and cooperative. Stays in her room most of the day, isolative. Medication compliant without argument. Poor hygiene, disheveled. Reports she had a visitor last night, "just a friend of mine. I hope he comes back to visit." Does not attend group.  Support and encouragement provided. Medications administered with education. Safety maintained with every 15 minute checks. Will continue to monitor.
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
## 2016-04-08 NOTE — Progress Notes (Signed)
Patient observed with a brighter affect today. She was smiling while in the medication room, responded appropriately to questions, stated that she is still having auditory hallucinations but the voices were "Low". Denied SI and contracted for safety. She was seen in the dayroom but was not interacting with peers. She did not attend groups either. Frequent verbal contacts made, fluids and groups encouraged. Will continue to monitor patient to ensure safety.

## 2016-04-08 NOTE — Plan of Care (Signed)
Problem: Coping: Goal: Ability to verbalize feelings will improve Outcome: Not Progressing Minimizing, denial

## 2016-04-08 NOTE — Progress Notes (Signed)
1930: Patient in bed, awake. Responsive upon approach. Alert and oriented. Responding with short answers, not very motivated to discuss her condition. Denies suicidal thoughts, homicidal thoughts and hallucinations. Was encouraged to attend bed time group: patient stated she will attend. Safety precautions maintained.

## 2016-04-08 NOTE — Progress Notes (Signed)
2300: Patient returned to bed after taking her medications. Did not participate in evening activities. Minimizing and stating that everything is fine. Paranoid with brief eye contact.Was encouraged to talk to staff and express her feelings and needs. Safety precautions reinforced

## 2016-04-08 NOTE — Plan of Care (Signed)
Problem: Self-Care: Goal: Ability to participate in self-care as condition permits will improve Outcome: Not Progressing Did not participate in group activities. Isolative in room

## 2016-04-08 NOTE — Plan of Care (Signed)
Problem: Activity: Goal: Will verbalize the importance of balancing activity with adequate rest periods Outcome: Not Progressing Remained isolative in room, denial, minimizing

## 2016-04-08 NOTE — [facility] Group Notes (Signed)
[facility] LCSW Group Therapy  04/08/2016 2:53 PM  Type of Therapy:  Group Therapy  Participation Level:  Patient did not attend group. CSW invited patient to group.   Summary of Progress/Problems: Communications: Patients identify how individuals communicate with one another appropriately and inappropriately. Patients will be guided to discuss their thoughts, feelings, and behaviors related to barriers when communicating. The group will process together ways to execute positive and appropriate communications.   Millicent Blazejewski [name] MSW, LCSWA 04/08/2016, 2:54 PM

## 2016-04-08 NOTE — Progress Notes (Signed)
Patient ID: Jessica [name], female   DOB: 02/10/1976, 41 y.o.   MRN: [id number]  Court Appointed guardian Ad Litem came to see patient on this date. Her name is Jessica [name] [phone].  Martrell Eguia [name] MSW, [facility] CampusCSWA 04/08/2016 4:50 PM

## 2016-04-08 NOTE — [facility] Group Notes (Signed)
[facility] Group Notes:  (Nursing/MHT/Case Management/Adjunct)  Date:  04/08/2016  Time:  5:53 AM  Type of Therapy:  Psychoeducational Skills  Participation Level:  Did Not Attend  Summary of Progress/Problems:  Chancy MilroyLaquanda Y Barlow Harrison 04/08/2016, 5:53 AM

## 2016-04-08 NOTE — Plan of Care (Signed)
Problem: Fluid Volume: Goal: Ability to maintain a balanced intake and output will improve Outcome: Progressing Patient compliant with fluid intake.

## 2016-04-09 MED ORDER — LORAZEPAM 0.5 MG PO TABS
0.2500 mg | ORAL_TABLET | Freq: Two times a day (BID) | ORAL | Status: DC
  Administered 2016-04-09 – 2016-04-11 (×4): 0.25 mg via ORAL
  Filled 2016-04-09 (×4): qty 1

## 2016-04-09 NOTE — Progress Notes (Addendum)
Patient with flat affect, eye contact is improved. Sad and quiet with minimal interaction with peers. Guarded when staff initiates interaction. No SI/HI a this time. Completes daily goal sheet with mindfulness as her daily goal. Patient using prayer and mindfullness as daily goal. Religiously preoccupied. Safety maintained. Med adjustments continue.
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
## 2016-04-09 NOTE — Plan of Care (Signed)
Problem: Activity: Goal: Risk for activity intolerance will decrease Outcome: Progressing Pt is tolerating activities well, denies any needs or concerns at this time.

## 2016-04-09 NOTE — Progress Notes (Signed)
Recreation Therapy Notes  Date: 02.12.18 Time: 9:30 am Location: Craft Room  Group Topic: Self-expression  Goal Area(s) Addresses:  Patient will effectively use art as a means of self-expression. Patient will recognize positive benefit of self-expression. Patient will be able to identify one emotion experienced during group session. Patient will identify use of art as a coping skill.  Behavioral Response: Did not attend  Intervention: Two Faces of Me  Activity: Patients were given blank face worksheets and were instructed to draw a line down the middle. On one side of the worksheet, patients were instructed to draw or write how they felt when they were admitted to the hospital. On the other side, patients were instructed to draw or write how they want to feel when they are d/c.  Education: LRT educated patients on other forms of self-expression.  Education Outcome: Patient did not attend group.   Clinical Observations/Feedback: Patient did not attend group.  Morganna Styles M, LRT/CTRS 04/09/2016 10:11 AM 

## 2016-04-09 NOTE — [facility] Group Notes (Signed)
[facility] LCSW Group Therapy Note  Date/Time: 04/09/16 1300  Type of Therapy and Topic:  Group Therapy:  Overcoming Obstacles  Participation Level:  PT did not attend group.  Description of Group:    In this group patients will be encouraged to explore what they see as obstacles to their own wellness and recovery. They will be guided to discuss their thoughts, feelings, and behaviors related to these obstacles. The group will process together ways to cope with barriers, with attention given to specific choices patients can make. Each patient will be challenged to identify changes they are motivated to make in order to overcome their obstacles. This group will be process-oriented, with patients participating in exploration of their own experiences as well as giving and receiving support and challenge from other group members.  Therapeutic Goals: 1. Patient will identify personal and current obstacles as they relate to admission. 2. Patient will identify barriers that currently interfere with their wellness or overcoming obstacles.  3. Patient will identify feelings, thought process and behaviors related to these barriers. 4. Patient will identify two changes they are willing to make to overcome these obstacles:    Summary of Patient Progress      Therapeutic Modalities:   Cognitive Behavioral Therapy Solution Focused Therapy Motivational Interviewing Relapse Prevention Therapy  Daleen SquibbGreg Absalom Aro, LCSW

## 2016-04-10 NOTE — Progress Notes (Signed)
Provided clean linens and assisted pt to change her bed linens. Pt cooperative. Also provided much encouragement for pt to wash her hair. Pt states, "I washed my hair this morning, I just don't use shampoo." Encouraged pt to use shampoo, questioned what type of shampoo she usually uses. Pt stated, "i'll just use the shampoo you have here. I guess I'll take another shower." Pt went into her bathroom. This nurse went to put dirty linens in dirty linen room. When this nurse entered room again, pt was in shower, aroma of soap was present. Safety maintained. Will continue to monitor.

## 2016-04-10 NOTE — Progress Notes (Signed)
Pt appears to have washed her hair using shampoo today as encouraged. Hair looks less greasy/oily. Improved hygiene. Reports good sleep last night with the help of sleep medication. Reports fair appetite, normal energy, good concentration. Rates depression 0/10, hopelessness 0/10, anxiety 1/10 (low 0-10 high). Denies SI/HI, endorses "low" auditory hallucinations. States todays goal is "to work on my stress levels and to be kind to others" by "breathing exercise and pray."  Improvement noted in eye contact, pt very pleasant and cooperative today. Continues to spend most of her day in bed, staring at the ceiling. Pt is medication compliant. Support and encouragement provided. Safety maintained with every 15 minute checks. Will continue to monitor.

## 2016-04-10 NOTE — Plan of Care (Addendum)
Problem: Skin Integrity: Goal: Risk for impaired skin integrity will decrease Outcome: Progressing Pt encouraged to shower and change linens. Pt did shower and washed her hair with shampoo. Cooperative with linen change.
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
## 2016-04-10 NOTE — [facility] Group Notes (Signed)
[facility] Group Notes:  (Nursing/MHT/Case Management/Adjunct)  Date:  04/10/2016  Time:  1:24 AM  Type of Therapy:  Group Therapy  Participation Level:  Did Not Attend    Jessica [name] 04/10/2016, 1:24 AM

## 2016-04-10 NOTE — Progress Notes (Signed)
Pt spent most evening hours in her room, in bed with eyes closed. Pt did not attend evening groups. However, pt was compliant with medication and denies any SI/HI/VAH, pt was pleasant on approach and voices no concerns. Will monitor closely for safety.

## 2016-04-10 NOTE — Progress Notes (Signed)
Recreation Therapy Notes  Date: 02.13.18 Time: 9:30 am Location: Craft Room  Group Topic: Goal Setting  Goal Area(s) Addresses:  Patient will write at least one goal. Patient will write at least one obstacle.  Behavioral Response: Did not attend  Intervention: Recovery Goal Chart  Activity: Patients were instructed to make a Recovery Goal Chart including goals, obstacles, the date they started working on their goal, and the date they achieved their goal.  Education: LRT educated patients on healthy ways they can celebrate reaching their goals.  Education Outcome: Patient did not attend group.   Clinical Observations/Feedback: Patient did not attend group.   Jacquelynn CreeGreene,Tiffanye Hartmann M, LRT/CTRS 04/10/2016 10:11 AM

## 2016-04-10 NOTE — [facility] Group Notes (Signed)
[facility] LCSW Group Therapy Note  Date/Time: 04/10/16, 1500  Type of Therapy/Topic:  Group Therapy:  Feelings about Diagnosis  Participation Level:  Did Not Attend   Mood:   Description of Group:    This group will allow patients to explore their thoughts and feelings about diagnoses they have received. Patients will be guided to explore their level of understanding and acceptance of these diagnoses. Facilitator will encourage patients to process their thoughts and feelings about the reactions of others to their diagnosis, and will guide patients in identifying ways to discuss their diagnosis with significant others in their lives. This group will be process-oriented, with patients participating in exploration of their own experiences as well as giving and receiving support and challenge from other group members.   Therapeutic Goals: 1. Patient will demonstrate understanding of diagnosis as evidence by identifying two or more symptoms of the disorder:  2. Patient will be able to express two feelings regarding the diagnosis 3. Patient will demonstrate ability to communicate their needs through discussion and/or role plays  Summary of Patient Progress:     Therapeutic Modalities:   Cognitive Behavioral Therapy Brief Therapy Feelings Identification   Greg Nonna Renninger, LCSW 

## 2016-04-11 MED ORDER — LORAZEPAM 0.5 MG PO TABS
0.2500 mg | ORAL_TABLET | Freq: Every day | ORAL | Status: DC
  Administered 2016-04-12: 0.25 mg via ORAL
  Filled 2016-04-11: qty 1

## 2016-04-11 NOTE — Plan of Care (Signed)
Problem: Self-Concept: Goal: Ability to verbalize positive feelings about self will improve Outcome: Not Met (add Reason) Patient remains dishelved and malodorous. Pt will verbalize acceptance of self.    

## 2016-04-11 NOTE — Tx Team (Signed)
Interdisciplinary Treatment and Diagnostic Plan Update  04/11/2016 Time of Session: 10:15am  Drucilla ChaletJennifer L Middendorf MRN: [id number]  Principal Diagnosis: Schizoaffective disorder, bipolar type [facility](HCC)  Secondary Diagnoses: Principal Problem:   Schizoaffective disorder, bipolar type (HCC) Active Problems:   IBS (irritable bowel syndrome)   Current Medications:  Current Facility-Administered Medications  Medication Dose Route Frequency Provider Last Rate Last Dose  . acetaminophen (TYLENOL) tablet 650 mg  650 mg Oral Q6H PRN Jimmy FootmanAndrea Hernandez-Gonzalez, MD      . alum & mag hydroxide-simeth (MAALOX/MYLANTA) 200-200-20 MG/5ML suspension 30 mL  30 mL Oral Q4H PRN Jimmy FootmanAndrea Hernandez-Gonzalez, MD      . amantadine (SYMMETREL) capsule 100 mg  100 mg Oral BID Jimmy FootmanAndrea Hernandez-Gonzalez, MD   100 mg at 04/11/16 0829  . feeding supplement (ENSURE ENLIVE) (ENSURE ENLIVE) liquid 237 mL  237 mL Oral TID PRN Jimmy FootmanAndrea Hernandez-Gonzalez, MD      . haloperidol (HALDOL) tablet 1 mg  1 mg Oral Daily Jimmy FootmanAndrea Hernandez-Gonzalez, MD   1 mg at 04/11/16 [id number]  . haloperidol (HALDOL) tablet 2 mg  2 mg Oral QHS Jimmy FootmanAndrea Hernandez-Gonzalez, MD   2 mg at 04/10/16 2108  . [START ON 04/12/2016] LORazepam (ATIVAN) tablet 0.25 mg  0.25 mg Oral QHS Jimmy FootmanAndrea Hernandez-Gonzalez, MD      . magnesium hydroxide (MILK OF MAGNESIA) suspension 30 mL  30 mL Oral Daily PRN Jimmy FootmanAndrea Hernandez-Gonzalez, MD   30 mL at 04/05/16 1150  . OLANZapine (ZYPREXA) injection 10 mg  10 mg Intramuscular QHS Jimmy FootmanAndrea Hernandez-Gonzalez, MD       Or  . OLANZapine (ZYPREXA) tablet 30 mg  30 mg Oral QHS Jimmy FootmanAndrea Hernandez-Gonzalez, MD   30 mg at 04/10/16 2107   PTA Medications: Prescriptions Prior to Admission  Medication Sig Dispense Refill Last Dose  . ARIPiprazole (ABILIFY) 5 MG tablet Take 5 mg by mouth daily.   02/29/2016 at Unknown time  . doxepin (SINEQUAN) 10 MG capsule Take 2 capsules (20 mg total) by mouth at bedtime as needed (sleep). 30 capsule 0 unknown  .  hydrOXYzine (ATARAX/VISTARIL) 25 MG tablet Take 1 tablet (25 mg total) by mouth every 6 (six) hours as needed for anxiety. 30 tablet 0 unknown  . OLANZapine (ZYPREXA) 2.5 MG tablet Take 5 tablets (12.5 mg total) by mouth at bedtime. (Patient not taking: Reported on 02/29/2016) 30 tablet 0 Not Taking at Unknown time  . OXcarbazepine (TRILEPTAL) 150 MG tablet Take 1 tablet (150 mg total) by mouth 2 (two) times daily. 60 tablet 0 02/29/2016 at Unknown time  . ziprasidone (GEODON) 20 MG capsule Take 1 capsule (20 mg total) by mouth 2 (two) times daily as needed (SEVERE ANXIETY/AGITATION). 30 capsule 0 unknown    Patient Stressors: Financial difficulties Marital or family conflict Medication change or noncompliance  Patient Strengths: Ability for Warden/rangerinsight Communication skills Motivation for treatment/growth  Treatment Modalities: Medication Management, Group therapy, Case management,  1 to 1 session with clinician, Psychoeducation, Recreational therapy.   Physician Treatment Plan for Primary Diagnosis: Schizoaffective disorder, bipolar type (HCC) Long Term Goal(s): Improvement in symptoms so as ready for discharge Improvement in symptoms so as ready for discharge   Short Term Goals: Ability to identify changes in lifestyle to reduce recurrence of condition will improve Ability to demonstrate self-control will improve Ability to identify and develop effective coping behaviors will improve Compliance with prescribed medications will improve Ability to identify triggers associated with substance abuse/mental health issues will improve Ability to identify changes in lifestyle to reduce recurrence of  condition will improve Ability to verbalize feelings will improve Ability to demonstrate self-control will improve Compliance with prescribed medications will improve Ability to identify triggers associated with substance abuse/mental health issues will improve  Medication Management: Evaluate  patient's response, side effects, and tolerance of medication regimen.  Therapeutic Interventions: 1 to 1 sessions, Unit Group sessions and Medication administration.  Evaluation of Outcomes: Progressing  Physician Treatment Plan for Secondary Diagnosis: Principal Problem:   Schizoaffective disorder, bipolar type (HCC) Active Problems:   IBS (irritable bowel syndrome)  Long Term Goal(s): Improvement in symptoms so as ready for discharge Improvement in symptoms so as ready for discharge   Short Term Goals: Ability to identify changes in lifestyle to reduce recurrence of condition will improve Ability to demonstrate self-control will improve Ability to identify and develop effective coping behaviors will improve Compliance with prescribed medications will improve Ability to identify triggers associated with substance abuse/mental health issues will improve Ability to identify changes in lifestyle to reduce recurrence of condition will improve Ability to verbalize feelings will improve Ability to demonstrate self-control will improve Compliance with prescribed medications will improve Ability to identify triggers associated with substance abuse/mental health issues will improve     Medication Management: Evaluate patient's response, side effects, and tolerance of medication regimen.  Therapeutic Interventions: 1 to 1 sessions, Unit Group sessions and Medication administration.  Evaluation of Outcomes: Progressing   RN Treatment Plan for Primary Diagnosis: Schizoaffective disorder, bipolar type (HCC) Long Term Goal(s): Knowledge of disease and therapeutic regimen to maintain health will improve  Short Term Goals: Ability to remain free from injury will improve, Ability to demonstrate self-control, Ability to disclose and discuss suicidal ideas and Compliance with prescribed medications will improve  Medication Management: RN will administer medications as ordered by  provider, will assess and evaluate patient's response and provide education to patient for prescribed medication. RN will report any adverse and/or side effects to prescribing provider.  Therapeutic Interventions: 1 on 1 counseling sessions, Psychoeducation, Medication administration, Evaluate responses to treatment, Monitor vital signs and CBGs as ordered, Perform/monitor CIWA, COWS, AIMS and Fall Risk screenings as ordered, Perform wound care treatments as ordered.  Evaluation of Outcomes: Progressing   LCSW Treatment Plan for Primary Diagnosis: Schizoaffective disorder, bipolar type (HCC) Long Term Goal(s): Safe transition to appropriate next level of care at discharge, Engage patient in therapeutic group addressing interpersonal concerns.  Short Term Goals: Engage patient in aftercare planning with referrals and resources, Increase social support, Facilitate acceptance of mental health diagnosis and concerns and Increase skills for wellness and recovery  Therapeutic Interventions: Assess for all discharge needs, 1 to 1 time with Social worker, Explore available resources and support systems, Assess for adequacy in community support network, Educate family and significant other(s) on suicide prevention, Complete Psychosocial Assessment, Interpersonal group therapy.  Evaluation of Outcomes: Progressing   Progress in Treatment: Attending groups: No. Participating in groups: No. Taking medication as prescribed: Yes. Toleration medication: Yes. Family/Significant other contact made: No, will contact:  patient refused for family contact.  Patient understands diagnosis: Yes. Discussing patient identified problems/goals with staff: Yes. Medical problems stabilized or resolved: Yes. Denies suicidal/homicidal ideation: Yes. Issues/concerns per patient self-inventory: No. Other: n/a  New problem(s) identified: None identified at this time.   New Short Term/Long Term Goal(s): None  identified at this time.   Discharge Plan or Barriers: Patient is on wait list for [facility]. Will also pursue transitional housing options and ACTT referral  Reason for Continuation of  Hospitalization: Delusions  Depression Hallucinations Medication stabilization  Estimated Length of Stay: 7 days.    Attendees: Patient:Jessica [name] 04/11/2016 6:10 PM  Physician: Ardyth Harps 04/11/2016 6:10 PM  Nursing: Leonia Reader 04/11/2016 6:10 PM  RN Care Manager: 04/11/2016 6:10 PM  Social Worker: Jake Shark, LCSW 04/11/2016 6:10 PM  Recreational Therapist: Hershal Coria, LRT 04/11/2016 6:10 PM  Other:  04/11/2016 6:10 PM  Other:  04/11/2016 6:10 PM  Other: 04/11/2016 6:10 PM    Scribe for Treatment Team: Glennon Mac, LCSW 04/11/2016 6:10 PM

## 2016-04-11 NOTE — Progress Notes (Signed)
Affect bright.  Pleasant and cooperative.  States that she feels better than she has.  Showered today although hair still looks greasy.  Verbalizes that shampoo here makes her hair greasy.  Medication compliant.  Still no group attendance.  Up to dayroom some today other than for meals.  Continues to stay to self.
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
## 2016-04-11 NOTE — Progress Notes (Signed)
Recreation Therapy Notes  Date: 02.14.18 Time: 9:30 am Location: Craft Room  Group Topic: Self-esteem  Goal Area(s) Addresses:  Patient will write at least one positive trait about self. Patient will verbalize benefit of having a healthy self-esteem.  Behavioral Response: Did not attend  Intervention: I Am  Activity: Patients were given a worksheet with the letter I on it and were instructed to write as many positive traits inside the letter.  Education: LRT educated patients on ways they can increase their self-esteem.  Education Outcome: Patient did not attend group.   Clinical Observations/Feedback: Patient did not attend group.  Jacquelynn CreeGreene,Averyanna Sax M, LRT/CTRS 04/11/2016 9:59 AM

## 2016-04-11 NOTE — [facility] Group Notes (Signed)
[facility] Group Notes:  (Nursing/MHT/Case Management/Adjunct)  Date:  04/11/2016  Time:  4:17 AM  Type of Therapy:  Psychoeducational Skills  Participation Level:  Did Not Attend  Summary of Progress/Problems:  Chancy MilroyLaquanda Y Jian [name] 04/11/2016, 4:17 AM

## 2016-04-12 NOTE — [facility] Group Notes (Signed)
[facility] LCSW Group Therapy Note  Date/Time: 04/12/16, 1300  Type of Therapy/Topic:  Group Therapy:  Balance in Life  Participation Level:  Pt was invited but did not attend group.  Description of Group:    This group will address the concept of balance and how it feels and looks when one is unbalanced. Patients will be encouraged to process areas in their lives that are out of balance, and identify reasons for remaining unbalanced. Facilitators will guide patients utilizing problem- solving interventions to address and correct the stressor making their life unbalanced. Understanding and applying boundaries will be explored and addressed for obtaining  and maintaining a balanced life. Patients will be encouraged to explore ways to assertively make their unbalanced needs known to significant others in their lives, using other group members and facilitator for support and feedback.  Therapeutic Goals: 1. Patient will identify two or more emotions or situations they have that consume much of in their lives. 2. Patient will identify signs/triggers that life has become out of balance:  3. Patient will identify two ways to set boundaries in order to achieve balance in their lives:  4. Patient will demonstrate ability to communicate their needs through discussion and/or role plays  Summary of Patient Progress:     Therapeutic Modalities:   Cognitive Behavioral Therapy Solution-Focused Therapy Assertiveness Training  Greg Marney Treloar, LCSW 

## 2016-04-12 NOTE — Progress Notes (Signed)
Pt is asleep in bed this evening, but she is oriented and has appropriate responses to questions. Pt mood is depressed and her affect is sad, but she is pleasant and cooperative with staff. Pt denies SI/HI and AVH at this time. Pt is also somewhat anxious but also interacts. She acknowledges needing more assistance than she previously thought. Pt remained in room throughout the night but seems otherwise vested in treatment. She is taking medications as prescribed. 15 minute checks are ongoing for safety.
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
## 2016-04-12 NOTE — Progress Notes (Signed)
Recreation Therapy Notes  Date: 02.15.18 Time: 9:30 am Location: Craft Room  Group Topic: Leisure Education  Goal Area(s) Addresses:  Patient will identify things they are grateful for.  Patient will identify how being grateful can influence decision making.  Behavioral Response: Did not attend  Intervention: Grateful Wheel  Activity: Patients were given an I Am Grateful For worksheet and were instructed to write things they are grateful for under each category.  Education: LRT educated patients on leisure and why it is important.  Education Outcome: Patient did not attend group.   Clinical Observations/Feedback: Patient did not attend group.  Arielle Eber M, LRT/CTRS 04/12/2016 10:01 AM 

## 2016-04-12 NOTE — [facility] Group Notes (Signed)
[facility] LCSW Group Therapy Note  Type of Therapy and Topic:  Group Therapy:  Goals Group: SMART Goals  Participation Level:  Patient did not attend group. CSW invited patient to group.   Description of Group:   The purpose of a daily goals group is to assist and guide patients in setting recovery/wellness-related goals.  The objective is to set goals as they relate to the crisis in which they were admitted. Patients will be using SMART goal modalities to set measurable goals.  Characteristics of realistic goals will be discussed and patients will be assisted in setting and processing how one will reach their goal. Facilitator will also assist patients in applying interventions and coping skills learned in psycho-education groups to the SMART goal and process how one will achieve defined goal.  Therapeutic Goals: -Patients will develop and document one goal related to or their crisis in which brought them into treatment. -Patients will be guided by LCSW using SMART goal setting modality in how to set a measurable, attainable, realistic and time sensitive goal.  -Patients will process barriers in reaching goal. -Patients will process interventions in how to overcome and successful in reaching goal.   Summary of Patient Progress:  Patient Goal: Patient did not attend group. CSW invited patient to group.    Therapeutic Modalities:   Motivational Interviewing Engineer, manufacturing systemsCognitive Behavioral Therapy Crisis Intervention Model SMART goals setting  Delainy Mcelhiney [name] MSW, LCSWA 04/12/2016 11:05 AM

## 2016-04-12 NOTE — [facility] Group Notes (Signed)
[facility] Group Notes:  (Nursing/MHT/Case Management/Adjunct)  Date:  04/12/2016  Time:  9:28 PM  Type of Therapy:  Psychoeducational Skills  Participation Level:  Did Not Attend  Participation Quality:   Mayra NeerJackie L Kaci Dillie 04/12/2016, 9:28 PM

## 2016-04-12 NOTE — Progress Notes (Signed)
Patient ID: Jessica ChaletJennifer L Lacaze, female   DOB: 07/22/1975, 41 y.o.   MRN: [id number]  CSW spoke with admission staff at [facility]. Informed CSW that patient is still on wait list. CSW inquired about sending additional progress notes and if anything is needed to speed of patient's transfer to [facility]. Staff informed CSW that they still do not have any current beds but CSW would be informed when a bed becomes available. Staff also informed CSW that no additional progress notes or documentation is needed at this time.   Imer Foxworth [name] MSW, LCSWA 04/12/2016 10:40 AM

## 2016-04-12 NOTE — Progress Notes (Signed)
Continues to isolate to room with minimal interaction with staff.  No interaction noted with peers.  No group attendance.  Support and encouragement offered.  Safety maintained.

## 2016-04-13 MED ORDER — OLANZAPINE 10 MG PO TABS
30.0000 mg | ORAL_TABLET | Freq: Every day | ORAL | Status: DC
  Administered 2016-04-13 – 2016-04-19 (×7): 30 mg via ORAL
  Filled 2016-04-13 (×8): qty 3

## 2016-04-13 NOTE — Progress Notes (Signed)
Recreation Therapy Notes  Date: 02.16.18 Time: 9:30 am Location: Craft Room  Group Topic: Coping Skills  Goal Area(s) Addresses:  Patient will participate in healthy coping skill. Patient will verbalize benefit of using art as a coping skill.  Behavioral Response: Did not attend  Intervention: Coloring  Activity: Patients were given coloring sheets to color and were instructed to think about the emotions they were feeling as well as what their minds were focused on.  Education: LRT educated patients on healthy coping skills.  Education Outcome: Patient did not attend group.   Clinical Observations/Feedback: Patient did not attend group.  Karene Bracken M, LRT/CTRS 04/13/2016 10:20 AM 

## 2016-04-13 NOTE — Progress Notes (Signed)
Continues to isolate to room.  Forwards little information.  Denies SI/HI/AVH.  Pleasant and polite.  Smiles at times.  Medication compliant.  Support and encouragement offered.  Safety maintained.
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
## 2016-04-13 NOTE — Plan of Care (Signed)
Problem: Safety: Goal: Ability to remain free from injury will improve Outcome: Not Met (add Reason) Medications administered as ordered. No PRNs given. S/E and adverse reactions discussed, questions encouraged. q 15 min checks maintained for safety.

## 2016-04-13 NOTE — [facility] Group Notes (Signed)
[facility] LCSW Group Therapy Note  Date/Time: 04/13/16, 1300  Type of Therapy and Topic:  Group Therapy:  Feelings around Relapse and Recovery  Participation Level:  Did Not Attend.  Pt was invited to group.   Mood:  Description of Group:    Patients in this group will discuss emotions they experience before and after a relapse. They will process how experiencing these feelings, or avoidance of experiencing them, relates to having a relapse. Facilitator will guide patients to explore emotions they have related to recovery. Patients will be encouraged to process which emotions are more powerful. They will be guided to discuss the emotional reaction significant others in their lives may have to patients' relapse or recovery. Patients will be assisted in exploring ways to respond to the emotions of others without this contributing to a relapse.  Therapeutic Goals: 1. Patient will identify two or more emotions that lead to relapse for them:  2. Patient will identify two emotions that result when they relapse:  3. Patient will identify two emotions related to recovery:  4. Patient will demonstrate ability to communicate their needs through discussion and/or role plays.   Summary of Patient Progress:     Therapeutic Modalities:   Cognitive Behavioral Therapy Solution-Focused Therapy Assertiveness Training Relapse Prevention Therapy  Daleen SquibbGreg Tukker Byrns, LCSW

## 2016-04-13 NOTE — Progress Notes (Signed)
Pt affect is little brighter. Pt continues to remain in bed throughout the shift except for medications and snack. Refused shower. Appears unkempt and malodorous. Med compliant. No PRNs given. q15 min checks maintained for safety.

## 2016-04-14 NOTE — Plan of Care (Signed)
Problem: Coping: Goal: Ability to cope will improve Outcome: Not Progressing Remains isolative in room, not participating in unit activities

## 2016-04-14 NOTE — Plan of Care (Signed)
Problem: Self-Care: Goal: Ability to participate in self-care as condition permits will improve Outcome: Not Progressing Continues to experience poor hygiene, refusing to wash her hair. Disheveled

## 2016-04-14 NOTE — [facility] Group Notes (Signed)
[facility] LCSW Group Therapy  04/14/2016 3:47 PM  Type of Therapy:  Group Therapy  Participation Level:  Patient did not attend group. CSW invited patient to group.   Summary of Progress/Problems: Self-responsibility/accountability- Patients discussed self responsibility/accountability  and how it impacts them. Patients were asked to define these concepts in their own words. They discussed taking ownership of their actions and the challenges they have with taking accountability for their self. CSW introduced "YOU vs. I" statements and explained how "I" statements identifies how they feel about the situation without being threatening or offensive to others and could help improve their communication with others. Examples were provided of each. They were challenged to identify changes that are needed in order to improve self responsibility/accountability. CSW provided inspirational quotes that focused on the patients taking accountability for actions both good and bad. Patients were asked to read the quotes out loud and share their thoughts with the group about their quote.  Jessica [name] [name] MSW, LCSWA 04/14/2016, 3:48 PM

## 2016-04-14 NOTE — Tx Team (Signed)
Interdisciplinary Treatment and Diagnostic Plan Update  04/14/2016 (Late Entry from 04/13/2016) Time of Session: 10:30am Jessica [name] MRN: [id number]  Principal Diagnosis: Schizoaffective disorder, bipolar type [facility](HCC)  Secondary Diagnoses: Principal Problem:   Schizoaffective disorder, bipolar type (HCC) Active Problems:   IBS (irritable bowel syndrome)   Current Medications:  Current Facility-Administered Medications  Medication Dose Route Frequency Provider Last Rate Last Dose  . acetaminophen (TYLENOL) tablet 650 mg  650 mg Oral Q6H PRN Jimmy FootmanAndrea Hernandez-Gonzalez, MD      . alum & mag hydroxide-simeth (MAALOX/MYLANTA) 200-200-20 MG/5ML suspension 30 mL  30 mL Oral Q4H PRN Jimmy FootmanAndrea Hernandez-Gonzalez, MD      . amantadine (SYMMETREL) capsule 100 mg  100 mg Oral BID Jimmy FootmanAndrea Hernandez-Gonzalez, MD   100 mg at 04/14/16 0755  . feeding supplement (ENSURE ENLIVE) (ENSURE ENLIVE) liquid 237 mL  237 mL Oral TID PRN Jimmy FootmanAndrea Hernandez-Gonzalez, MD      . haloperidol (HALDOL) tablet 1 mg  1 mg Oral Daily Jimmy FootmanAndrea Hernandez-Gonzalez, MD   1 mg at 04/14/16 0756  . haloperidol (HALDOL) tablet 2 mg  2 mg Oral QHS Jimmy FootmanAndrea Hernandez-Gonzalez, MD   2 mg at 04/13/16 2201  . magnesium hydroxide (MILK OF MAGNESIA) suspension 30 mL  30 mL Oral Daily PRN Jimmy FootmanAndrea Hernandez-Gonzalez, MD   30 mL at 04/05/16 1150  . OLANZapine (ZYPREXA) tablet 30 mg  30 mg Oral QHS Jimmy FootmanAndrea Hernandez-Gonzalez, MD   30 mg at 04/13/16 2201   PTA Medications: Prescriptions Prior to Admission  Medication Sig Dispense Refill Last Dose  . ARIPiprazole (ABILIFY) 5 MG tablet Take 5 mg by mouth daily.   02/29/2016 at Unknown time  . doxepin (SINEQUAN) 10 MG capsule Take 2 capsules (20 mg total) by mouth at bedtime as needed (sleep). 30 capsule 0 unknown  . hydrOXYzine (ATARAX/VISTARIL) 25 MG tablet Take 1 tablet (25 mg total) by mouth every 6 (six) hours as needed for anxiety. 30 tablet 0 unknown  . OLANZapine (ZYPREXA) 2.5 MG tablet Take 5  tablets (12.5 mg total) by mouth at bedtime. (Patient not taking: Reported on 02/29/2016) 30 tablet 0 Not Taking at Unknown time  . OXcarbazepine (TRILEPTAL) 150 MG tablet Take 1 tablet (150 mg total) by mouth 2 (two) times daily. 60 tablet 0 02/29/2016 at Unknown time  . ziprasidone (GEODON) 20 MG capsule Take 1 capsule (20 mg total) by mouth 2 (two) times daily as needed (SEVERE ANXIETY/AGITATION). 30 capsule 0 unknown    Patient Stressors: Financial difficulties Marital or family conflict Medication change or noncompliance  Patient Strengths: Ability for Warden/rangerinsight Communication skills Motivation for treatment/growth  Treatment Modalities: Medication Management, Group therapy, Case management,  1 to 1 session with clinician, Psychoeducation, Recreational therapy.   Physician Treatment Plan for Primary Diagnosis: Schizoaffective disorder, bipolar type (HCC) Long Term Goal(s): Improvement in symptoms so as ready for discharge Improvement in symptoms so as ready for discharge   Short Term Goals: Ability to identify changes in lifestyle to reduce recurrence of condition will improve Ability to demonstrate self-control will improve Ability to identify and develop effective coping behaviors will improve Compliance with prescribed medications will improve Ability to identify triggers associated with substance abuse/mental health issues will improve Ability to identify changes in lifestyle to reduce recurrence of condition will improve Ability to verbalize feelings will improve Ability to demonstrate self-control will improve Compliance with prescribed medications will improve Ability to identify triggers associated with substance abuse/mental health issues will improve  Medication Management: Evaluate patient's response, side effects,  and tolerance of medication regimen.  Therapeutic Interventions: 1 to 1 sessions, Unit Group sessions and Medication administration.  Evaluation of Outcomes:  Progressing  Physician Treatment Plan for Secondary Diagnosis: Principal Problem:   Schizoaffective disorder, bipolar type (HCC) Active Problems:   IBS (irritable bowel syndrome)  Long Term Goal(s): Improvement in symptoms so as ready for discharge Improvement in symptoms so as ready for discharge   Short Term Goals: Ability to identify changes in lifestyle to reduce recurrence of condition will improve Ability to demonstrate self-control will improve Ability to identify and develop effective coping behaviors will improve Compliance with prescribed medications will improve Ability to identify triggers associated with substance abuse/mental health issues will improve Ability to identify changes in lifestyle to reduce recurrence of condition will improve Ability to verbalize feelings will improve Ability to demonstrate self-control will improve Compliance with prescribed medications will improve Ability to identify triggers associated with substance abuse/mental health issues will improve     Medication Management: Evaluate patient's response, side effects, and tolerance of medication regimen.  Therapeutic Interventions: 1 to 1 sessions, Unit Group sessions and Medication administration.  Evaluation of Outcomes: Progressing   RN Treatment Plan for Primary Diagnosis: Schizoaffective disorder, bipolar type (HCC) Long Term Goal(s): Knowledge of disease and therapeutic regimen to maintain health will improve  Short Term Goals: Ability to remain free from injury will improve, Ability to demonstrate self-control, Ability to disclose and discuss suicidal ideas and Compliance with prescribed medications will improve  Medication Management: RN will administer medications as ordered by provider, will assess and evaluate patient's response and provide education to patient for prescribed medication. RN will report any adverse and/or side effects to prescribing provider.  Therapeutic  Interventions: 1 on 1 counseling sessions, Psychoeducation, Medication administration, Evaluate responses to treatment, Monitor vital signs and CBGs as ordered, Perform/monitor CIWA, COWS, AIMS and Fall Risk screenings as ordered, Perform wound care treatments as ordered.  Evaluation of Outcomes: Progressing   LCSW Treatment Plan for Primary Diagnosis: Schizoaffective disorder, bipolar type (HCC) Long Term Goal(s): Safe transition to appropriate next level of care at discharge, Engage patient in therapeutic group addressing interpersonal concerns.  Short Term Goals: Engage patient in aftercare planning with referrals and resources, Increase social support, Facilitate acceptance of mental health diagnosis and concerns and Increase skills for wellness and recovery  Therapeutic Interventions: Assess for all discharge needs, 1 to 1 time with Social worker, Explore available resources and support systems, Assess for adequacy in community support network, Educate family and significant other(s) on suicide prevention, Complete Psychosocial Assessment, Interpersonal group therapy.  Evaluation of Outcomes: Progressing   Progress in Treatment: Attending groups: No. Participating in groups: No. Taking medication as prescribed: Yes. Toleration medication: Yes. Family/Significant other contact made: No, will contact:  Patient has refused for family contact.  Patient understands diagnosis: Yes. Discussing patient identified problems/goals with staff: Yes. Medical problems stabilized or resolved: Yes. Denies suicidal/homicidal ideation: Yes. Issues/concerns per patient self-inventory: No. Other: n/a  New problem(s) identified: None identified at this time.   New Short Term/Long Term Goal(s): None identified at this time.   Discharge Plan or Barriers: Patient is still on wait list for [facility].   Reason for Continuation of Hospitalization: Anxiety Depression Medication  stabilization  Estimated Length of Stay: 7 days.   Attendees: Patient: Jessica [name] 04/14/2016 10:01 AM  Physician: Dr. Radene JourneyJayme Cloud, MD 04/14/2016 10:01 AM  Nursing: Leonia Reader, RN 04/14/2016 10:01 AM  RN Care Manager: 04/14/2016 10:01 AM  Social Worker: Fredrich Birks. Garnette Czech MSW, LCSWA 04/14/2016 10:01 AM  Recreational Therapist: Jacquelynn Cree, LRT/CTRS 04/14/2016 10:01 AM  Other:  04/14/2016 10:01 AM  Other:  04/14/2016 10:01 AM  Other: 04/14/2016 10:01 AM    Scribe for Treatment Team: Arelia Longest, LCSWA 04/14/2016 10:04 AM

## 2016-04-14 NOTE — Progress Notes (Addendum)
[facility] MD Progress Note  04/14/2016 2:10 PM Jessica [name]  MRN:  [id number] Subjective:  Patient is a 41 year old Caucasian female with history of schizoaffective disorder admitted for worsening psychosis and non compliance. Pt contniue to be psychotic, responding to internal stimuli. , pt guarded, isolates self, poor hygiene, reports hearing voices, would not elaborate. Denies SI/Hi.  Principal Problem: Schizoaffective disorder, bipolar type (HCC) Diagnosis:   Patient Active Problem List   Diagnosis Date Noted  . IBS (irritable bowel syndrome) [K58.9] 03/21/2016  . Schizoaffective disorder, bipolar type (HCC) [F25.0] 07/25/2015   Total Time spent with patient: 30 minutes  Past Psychiatric History:   Past Medical History:  Past Medical History:  Diagnosis Date  . Anxiety   . Bipolar 1 disorder (HCC)   . Depression   . Schizophrenia (HCC)    History reviewed. No pertinent surgical history. Family History:  Family History  Problem Relation Age of Onset  . Hypertension Mother   . Mental illness Father    Family Psychiatric  History:  Social History:  History  Alcohol Use No     History  Drug Use No    Social History   Social History  . Marital status: Single    Spouse name: N/A  . Number of children: N/A  . Years of education: N/A   Social History Main Topics  . Smoking status: Never Smoker  . Smokeless tobacco: Never Used  . Alcohol use No  . Drug use: No  . Sexual activity: No   Other Topics Concern  . None   Social History Narrative  . None   Additional Social History:    Pain Medications: none Prescriptions: none Over the Counter: none History of alcohol / drug use?: No history of alcohol / drug abuse Negative Consequences of Use: Personal relationships, Surveyor, quantity, Work / School Withdrawal Symptoms: Other (Comment)                    Sleep: Good  Appetite:  Fair  Current Medications: Current Facility-Administered Medications   Medication Dose Route Frequency Provider Last Rate Last Dose  . acetaminophen (TYLENOL) tablet 650 mg  650 mg Oral Q6H PRN Jimmy Footman, MD      . alum & mag hydroxide-simeth (MAALOX/MYLANTA) 200-200-20 MG/5ML suspension 30 mL  30 mL Oral Q4H PRN Jimmy Footman, MD      . amantadine (SYMMETREL) capsule 100 mg  100 mg Oral BID Jimmy Footman, MD   100 mg at 04/14/16 0755  . feeding supplement (ENSURE ENLIVE) (ENSURE ENLIVE) liquid 237 mL  237 mL Oral TID PRN Jimmy Footman, MD      . haloperidol (HALDOL) tablet 1 mg  1 mg Oral Daily Jimmy Footman, MD   1 mg at 04/14/16 0756  . haloperidol (HALDOL) tablet 2 mg  2 mg Oral QHS Jimmy Footman, MD   2 mg at 04/13/16 2201  . magnesium hydroxide (MILK OF MAGNESIA) suspension 30 mL  30 mL Oral Daily PRN Jimmy Footman, MD   30 mL at 04/05/16 1150  . OLANZapine (ZYPREXA) tablet 30 mg  30 mg Oral QHS Jimmy Footman, MD   30 mg at 04/13/16 2201    Lab Results: No results found for this or any previous visit (from the past 48 hour(s)).  Blood Alcohol level:  Lab Results  Component Value Date   [facility] <5 02/29/2016   ETH <5 08/03/2015    Metabolic Disorder Labs: Lab Results  Component Value Date   HGBA1C  5.4 03/07/2016   MPG 108 03/07/2016   MPG 117 07/27/2015   Lab Results  Component Value Date   PROLACTIN 20.7 07/27/2015   Lab Results  Component Value Date   CHOL 165 03/07/2016   TRIG 67 03/07/2016   HDL 48 03/07/2016   CHOLHDL 3.4 03/07/2016   VLDL 13 03/07/2016   LDLCALC 104 (H) 03/07/2016   LDLCALC 95 07/27/2015    Physical Findings: AIMS:  , ,  ,  ,    CIWA:    COWS:  COWS Total Score: 1  Musculoskeletal: Strength & Muscle Tone: within normal limits Gait & Station: normal Patient leans: N/A  Psychiatric Specialty Exam: Physical Exam  Nursing note and vitals reviewed.   Review of Systems  Psychiatric/Behavioral: Positive for  depression.    Blood pressure 122/65, pulse 75, temperature 98.4 F (36.9 C), resp. rate 18, height 5\' 4"  (1.626 m), weight 90.7 kg (200 lb), last menstrual period 02/27/2016, SpO2 98 %.Body mass index is 34.33 kg/m.  General Appearance: Fairly Groomed  Eye Contact:  Minimal  Speech:  Normal Rate  Volume:  Normal  Mood:  ok  Affect:  restricted  Thought Process:  Linear and Descriptions of Associations: Intact  Orientation:  Full (Time, Place, and Person)  Thought Content:  Delusions and Paranoid Ideation  Suicidal Thoughts:  No  Homicidal Thoughts:  No  Memory:  Immediate;   Fair Recent;   Fair Remote;   Fair  Judgement:  Impaired  Insight:  Lacking  Psychomotor Activity:  Decreased  Concentration:  Concentration: Poor and Attention Span: Poor  Recall:  [employer]Fair  Fund of Knowledge:  Fair  Language:  Good  Akathisia:  No  Handed:    AIMS (if indicated):     Assets:  Manufacturing systems engineerCommunication Skills Physical Health  ADL's:  Intact  Cognition:  Impaired,  Mild  Sleep:  Number of Hours: 7.75                                                          Treatment Plan Summary: Patient is a [age] year old Caucasian female with history of schizoaffective bipolar type. He presented due to aggression, agitation, and psychosis. Patient has not been compliant with medications.  For schizoaffective disorder: Patient  started on olanzapine zydis 10 mg by mouth twice a day.  will order EKG for QTc.  Also started depakote ER 2000 mg qhs.  Will check depakote level in 3-4  days  For agitation- Ativan 2 mg every 4 hours as needed   Diet regular  Vital signs daily  Precautions every 15 minute checks  Hospitalization status continue involuntary commitment  Labs -TSH, hemoglobin A1c and lipid panel  Collateral information will be obtained from the patient's mother  Chart review has been completed  Labs have been reviewed (pregnancy neg, no UTI.  Utox + for  benzos, cbc, cmp wnl)   Physician Treatment Plan for Primary Diagnosis: Schizoaffective disorder, bipolar type (HCC) Long Term Goal(s): Improvement in symptoms so as ready for discharge  Short Term Goals: Ability to identify changes in lifestyle to reduce recurrence of condition will improve, Ability to demonstrate self-control will improve, Ability to identify and develop effective coping behaviors will improve, Compliance with prescribed medications will improve and Ability to identify triggers associated with substance abuse/mental health issues will  improve  Physician Treatment Plan for Secondary Diagnosis: Principal Problem:   Schizoaffective disorder, bipolar type (HCC)  Long Term Goal(s): Improvement in symptoms so as ready for discharge  Short Term Goals: Ability to identify changes in lifestyle to reduce recurrence of condition will improve, Ability to verbalize feelings will improve, Ability to demonstrate self-control will improve, Compliance with prescribed medications will improve and Ability to identify triggers associated with substance abuse/mental health issues will improve  I certify that inpatient services furnished can reasonably be expected to improve the patient's condition.    Beverly Sessions, MD 04/14/2016, 2:10 PM

## 2016-04-14 NOTE — Progress Notes (Signed)
Continues to isolate.  Medication compliant.  Denies SI/HI/AVH.  Support and encouragement offered.  Safety maintained.

## 2016-04-14 NOTE — Progress Notes (Signed)
Patient remained isolative in bed, did not participate in unit activities. Presented to the medication room. Alert and oriented but paranoid and avoiding conversations. "I am alright, everything is fine". Had no major concern. Patient took her medications and returned to bed. Currently sleeping. Safety precautions maintained.

## 2016-04-15 NOTE — [facility] Group Notes (Signed)
[facility] LCSW Group Therapy  04/15/2016 3:16 PM  Type of Therapy:  Group Therapy  Participation Level:  Patient did not attend group. CSW invited patient to group.   Summary of Progress/Problems: Recognizing Triggers: Patients defined triggers and discussed the importance of recognizing their personal warning signs. Patients identified their own triggers and how they tend to cope with stressful situations. Patients discussed areas such as people, places, things, and thoughts that rigger certain emotions for them. CSW provided support to patients and discussed safety planning for when these triggers occur. Group participants had opportunities to share openly with the group and participate in a group discussion while providing support and feedback to their peers.  Aadarsh Cozort [name] MSW, LCSWA 04/15/2016, 3:16 PM

## 2016-04-15 NOTE — Progress Notes (Signed)
[facility] MD Progress Note  04/15/2016 12:10 PM Jessica [name]  MRN:  [id number] Subjective:  Patient is a 41 year old Caucasian female with history of schizoaffective disorder admitted for worsening psychosis and non compliance. Pt contniue to be psychotic, responding to internal stimuli. , pt guarded, isolates self, denies  hearing voices today, mood brighter, denies depression, Denies SI/Hi. EKG -wnl. Principal Problem: Schizoaffective disorder, bipolar type (HCC) Diagnosis:   Patient Active Problem List   Diagnosis Date Noted  . IBS (irritable bowel syndrome) [K58.9] 03/21/2016  . Schizoaffective disorder, bipolar type (HCC) [F25.0] 07/25/2015   Total Time spent with patient: 30 minutes  Past Psychiatric History:   Past Medical History:  Past Medical History:  Diagnosis Date  . Anxiety   . Bipolar 1 disorder (HCC)   . Depression   . Schizophrenia (HCC)    History reviewed. No pertinent surgical history. Family History:  Family History  Problem Relation Age of Onset  . Hypertension Mother   . Mental illness Father    Family Psychiatric  History:  Social History:  History  Alcohol Use No     History  Drug Use No    Social History   Social History  . Marital status: Single    Spouse name: N/A  . Number of children: N/A  . Years of education: N/A   Social History Main Topics  . Smoking status: Never Smoker  . Smokeless tobacco: Never Used  . Alcohol use No  . Drug use: No  . Sexual activity: No   Other Topics Concern  . None   Social History Narrative  . None   Additional Social History:    Pain Medications: none Prescriptions: none Over the Counter: none History of alcohol / drug use?: No history of alcohol / drug abuse Negative Consequences of Use: Personal relationships, Surveyor, quantity, Work / School Withdrawal Symptoms: Other (Comment)                    Sleep: Good  Appetite:  Fair  Current Medications: Current Facility-Administered  Medications  Medication Dose Route Frequency Provider Last Rate Last Dose  . acetaminophen (TYLENOL) tablet 650 mg  650 mg Oral Q6H PRN Jimmy Footman, MD      . alum & mag hydroxide-simeth (MAALOX/MYLANTA) 200-200-20 MG/5ML suspension 30 mL  30 mL Oral Q4H PRN Jimmy Footman, MD      . amantadine (SYMMETREL) capsule 100 mg  100 mg Oral BID Jimmy Footman, MD   100 mg at 04/15/16 0840  . feeding supplement (ENSURE ENLIVE) (ENSURE ENLIVE) liquid 237 mL  237 mL Oral TID PRN Jimmy Footman, MD      . haloperidol (HALDOL) tablet 1 mg  1 mg Oral Daily Jimmy Footman, MD   1 mg at 04/15/16 0840  . haloperidol (HALDOL) tablet 2 mg  2 mg Oral QHS Jimmy Footman, MD   2 mg at 04/14/16 2134  . magnesium hydroxide (MILK OF MAGNESIA) suspension 30 mL  30 mL Oral Daily PRN Jimmy Footman, MD   30 mL at 04/14/16 1650  . OLANZapine (ZYPREXA) tablet 30 mg  30 mg Oral QHS Jimmy Footman, MD   30 mg at 04/14/16 2134    Lab Results: No results found for this or any previous visit (from the past 48 hour(s)).  Blood Alcohol level:  Lab Results  Component Value Date   [facility] <5 02/29/2016   ETH <5 08/03/2015    Metabolic Disorder Labs: Lab Results  Component Value Date  HGBA1C 5.4 03/07/2016   MPG 108 03/07/2016   MPG 117 07/27/2015   Lab Results  Component Value Date   PROLACTIN 20.7 07/27/2015   Lab Results  Component Value Date   CHOL 165 03/07/2016   TRIG 67 03/07/2016   HDL 48 03/07/2016   CHOLHDL 3.4 03/07/2016   VLDL 13 03/07/2016   LDLCALC 104 (H) 03/07/2016   LDLCALC 95 07/27/2015    Physical Findings: AIMS:  , ,  ,  ,    CIWA:    COWS:  COWS Total Score: 1  Musculoskeletal: Strength & Muscle Tone: within normal limits Gait & Station: normal Patient leans: N/A  Psychiatric Specialty Exam: Physical Exam  Nursing note and vitals reviewed.   ROS  Blood pressure 109/71, pulse 82, temperature  98.7 F (37.1 C), temperature source Oral, resp. rate 18, height 5\' 4"  (1.626 m), weight 90.7 kg (200 lb), last menstrual period 02/27/2016, SpO2 98 %.Body mass index is 34.33 kg/m.  General Appearance: Fairly Groomed  Eye Contact:  Minimal  Speech:  Normal Rate  Volume:  Normal  Mood:  ok  Affect:  restricted  Thought Process:  Linear and Descriptions of Associations: Intact  Orientation:  Full (Time, Place, and Person)  Thought Content:  Delusions and Paranoid Ideation  Suicidal Thoughts:  No  Homicidal Thoughts:  No  Memory:  Immediate;   Fair Recent;   Fair Remote;   Fair  Judgement:  Impaired  Insight:  Lacking  Psychomotor Activity:  Decreased  Concentration:  Concentration: Poor and Attention Span: Poor  Recall:  [employer]Fair  Fund of Knowledge:  Fair  Language:  Good  Akathisia:  No  Handed:    AIMS (if indicated):     Assets:  Manufacturing systems engineerCommunication Skills Physical Health  ADL's:  Intact  Cognition:  Impaired,  Mild  Sleep:  Number of Hours:                                                           Treatment Plan Summary: Patient is a 41 year old Caucasian female with history of schizoaffective bipolar type. He presented due to aggression, agitation, and psychosis. Patient has not been compliant with medications.  For schizoaffective disorder: Patient  started on olanzapine zydis 10 mg by mouth twice a day.  QTc-420, NSR.  Also started depakote ER 2000 mg qhs.  Will check depakote level in 2-3  days  For agitation- Ativan 2 mg every 4 hours as needed   Diet regular  Vital signs daily  Precautions every 15 minute checks  Hospitalization status continue involuntary commitment  Labs -TSH, hemoglobin A1c and lipid panel  Collateral information will be obtained from the patient's mother  Chart review has been completed  Labs have been reviewed (pregnancy neg, no UTI.  Utox + for benzos, cbc, cmp wnl)   Physician Treatment Plan for  Primary Diagnosis: Schizoaffective disorder, bipolar type (HCC) Long Term Goal(s): Improvement in symptoms so as ready for discharge  Short Term Goals: Ability to identify changes in lifestyle to reduce recurrence of condition will improve, Ability to demonstrate self-control will improve, Ability to identify and develop effective coping behaviors will improve, Compliance with prescribed medications will improve and Ability to identify triggers associated with substance abuse/mental health issues will improve  Physician Treatment Plan for Secondary Diagnosis:  Principal Problem:   Schizoaffective disorder, bipolar type (HCC)  Long Term Goal(s): Improvement in symptoms so as ready for discharge  Short Term Goals: Ability to identify changes in lifestyle to reduce recurrence of condition will improve, Ability to verbalize feelings will improve, Ability to demonstrate self-control will improve, Compliance with prescribed medications will improve and Ability to identify triggers associated with substance abuse/mental health issues will improve  I certify that inpatient services furnished can reasonably be expected to improve the patient's condition.    Beverly Sessions, MD 04/15/2016, 12:10 PMPatient ID: Jessica [name], female   DOB: [birth year], 41 y.o.   MRN: [id number]

## 2016-04-15 NOTE — Progress Notes (Signed)
Patient ID: Jessica [name], female   DOB: 12/13/1975, 41 y.o.   MRN: [id number]  CSW received call from Jessica RossettiBarbara [name] (admission RN) [facility] asking if patient was still in hospital. CSW informed admission RN that patient was still inpatient. Informed CSW that patient is still on the wait list for [facility].  Jessica [name] MSW, LCSWA 04/15/2016 9:40 AM

## 2016-04-15 NOTE — Plan of Care (Signed)
Problem: Health Behavior/Discharge Planning: Goal: Compliance with prescribed medication regimen will improve Outcome: Progressing Pt has been compliant with prescribed medications.   

## 2016-04-15 NOTE — Plan of Care (Signed)
Problem: Education: Goal: Will be free of psychotic symptoms Outcome: Progressing Pt noted to interact with staff more and able to ask and answer logical questions.

## 2016-04-15 NOTE — Progress Notes (Signed)
Pt noted to appear less psychotic. Presented to medication room for meds. Isolated to room majority of shift. Pleasant during interaction. Did ask logical questions about Zyprexa and verbalized understanding of education of medication. Denies pain. Safety maintained. Will continue to monitor.

## 2016-04-15 NOTE — Progress Notes (Signed)
Patient with flat affect, increased eye contact, quiet speech. Cooperative with meals, meds and plan of care. No SI/HI at this time. Safety maintained.

## 2016-04-16 MED ORDER — HALOPERIDOL 2 MG PO TABS
2.0000 mg | ORAL_TABLET | Freq: Two times a day (BID) | ORAL | Status: DC
  Administered 2016-04-16 – 2016-04-20 (×8): 2 mg via ORAL
  Filled 2016-04-16 (×8): qty 1

## 2016-04-16 NOTE — [facility] Group Notes (Signed)
[facility] Group Notes:  (Nursing/MHT/Case Management/Adjunct)  Date:  04/16/2016  Time:  10:19 PM  Type of Therapy:    Participation Level:  Did Not Attend  Participation Quality: Summary of Progress/Problems:  Jessica [name] 04/16/2016, 10:19 PM

## 2016-04-16 NOTE — [facility] Group Notes (Signed)
[facility] Group Notes:  (Nursing/MHT/Case Management/Adjunct)  Date:  04/16/2016  Time:  1:11 AM  Type of Therapy:  Group Therapy  Participation Level:  Did Not Attend   Veva Holesshley Imani Elnore Cosens 04/16/2016, 1:11 AM
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
## 2016-04-16 NOTE — Progress Notes (Signed)
Recreation Therapy Notes  Date: 02.19.18 Time: 9:30 am Location: Day Room on [facility]   Group Topic: Self-expression   Goal Area(s) Addresses:  Patient will be able to identify a color that represents each emotion. Patient will verbalize benefit of using as a means of self-expression. Patient will verbalize one emotion experienced while participating in activity.   Behavioral Response: Did not attend   Intervention: The Colors Within Me   Activity: Patients were given a blank face worksheet and were instructed to pick a color for each emotion they were feeling and show on the worksheet how much of that emotion they were feeling.   Education: LRT educated patients on other forms of self-expression.   Education Outcome: Patient did not attend group.   Clinical Observations/Feedback: Patient did not attend group.  Jacquelynn CreeGreene,Crissa Sowder M, LRT/CTRS 04/16/2016 10:11 AM

## 2016-04-16 NOTE — Progress Notes (Signed)
Denies SI/HI/AVH. Noted to be out of room more often than previously noted. Visible in dayroom with minimal interaction. Presented to med room for medications. Denies pain. Voices no additional concerns at this time. Pleasant during interaction. Safety maintained. Will continue to monitor.

## 2016-04-16 NOTE — Progress Notes (Signed)
Patient is pleasant on approach.Isolates in the room.Minimal interactions with peers.Denies SI/HI and AVH.Support & encouragement given.

## 2016-04-17 NOTE — Progress Notes (Signed)
Recreation Therapy Notes  Date: 02.20.18 Time: 9:30 am Location: Day Room on [facility]   Group Topic: Goal Setting   Goal Area(s) Addresses:  Patient will be able to identify one goal. Patient will verbalize benefit of setting goals.   Behavioral Response: Attentive, Interactive   Intervention: Step By Step   Activity: Patients were given a worksheet with a foot on it and were instructed to write a goal inside the foot. Patients were instructed to write supportive statements on the outside of the foot.   Education: LRT educated patients on ways to celebrate reaching their goals in a healthy way.   Education Outcome: In group clarification offered   Clinical Observations/Feedback: Patient wrote a goal and supportive statements. Patient contributed to group discussion by stating it was not difficult to think of a goal and it was more difficult to think of the supporting statements.  Jacquelynn CreeGreene,Graceyn Fodor M, LRT/CTRS 04/17/2016 10:07 AM

## 2016-04-17 NOTE — Progress Notes (Signed)
Pt pleasant and cooperative with care. Med and group compliant. Appropriate with staff and peers. Denies SI, HI, AVH. Pt did attend 2 groups today.  Encouragement and support offered. Medications given as prescribed. Safety maintained. Pt receptive and remains safe on unit with q 15 min checks

## 2016-04-17 NOTE — Progress Notes (Signed)
Patient stayed in room, isolative but pleasant and cooperative upon approach. Reports that her thought process is becoming more clear "I feel different...  medication is definitely helping me...". Patient received her bed time medication and returned to bed. Currently sleeping . No sign of discomfort. Safety precautions maintained.

## 2016-04-17 NOTE — Plan of Care (Signed)
Problem: Activity: Goal: Will verbalize the importance of balancing activity with adequate rest periods Outcome: Progressing Verbalizing improvement in mood and thought process

## 2016-04-17 NOTE — Progress Notes (Signed)
Pt awake, alert, oriented and up on unit tonight. Observed in dayroom with minimal interactions with peers. Much improvement noted. Pt smiles brightly on interaction, carries on conversation with this nurse. Reports her thoughts are "much better." Reports she did go to group today and "learned some things." Pt very pleasant and cooperative. Took medications without complaint before bed. Support and encouragement provided. Medications administered as ordered with education. Safety maintained with every 15 minute checks. Will continue to monitor.

## 2016-04-17 NOTE — Plan of Care (Signed)
Problem: Coping: Goal: Ability to cope will improve Outcome: Progressing Pt discussing her thoughts and feelings

## 2016-04-17 NOTE — Plan of Care (Signed)
Problem: Self-Care: Goal: Ability to participate in self-care as condition permits will improve Outcome: Progressing Pt hygiene much improved, pt adequately washes her hair. Completes ADLs independently.
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
## 2016-04-18 MED ORDER — AMANTADINE HCL 100 MG PO CAPS: 100.0000 mg | ORAL_CAPSULE | Freq: Two times a day (BID) | ORAL | 0 refills | Status: AC

## 2016-04-18 MED ORDER — HALOPERIDOL 2 MG PO TABS: 2.0000 mg | ORAL_TABLET | Freq: Two times a day (BID) | ORAL | 0 refills | Status: DC

## 2016-04-18 MED ORDER — OLANZAPINE 15 MG PO TABS: 30.0000 mg | ORAL_TABLET | Freq: Every day | ORAL | 0 refills | Status: DC
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
## 2016-04-18 NOTE — Progress Notes (Signed)
Recreation Therapy Notes  Date: 02.21.18 Time: 9:30 am Location: Craft Room  Group Topic: Self-esteem  Goal Area(s) Addresses:  Patients will be able to identify at least one positive trait about self. Patient will verbalize benefit of having a healthy self-esteem.  Behavioral Response: Attentive, Interactive  Intervention: I Am  Activity: Patients were given worksheets with the letter I on it and were instructed to write as many positive traits about themselves inside the letter.  Education: LRT educated patients on ways they can increase their self-esteem.  Education Outcome: Acknowledges education/In group clarification offered  Clinical Observations/Feedback: Patient wrote positive traits about self. Patient contributed to group discussion by stating that it was difficult at first to think of positive traits then it got easier, what makes it difficult to think of positive traits, and how we can increase our self-esteem.  Jacquelynn CreeGreene,Jessica [name] M, LRT/CTRS 04/18/2016 10:25 AM

## 2016-04-18 NOTE — [facility] Group Notes (Signed)
[facility] Group Notes:  (Nursing/MHT/Case Management/Adjunct)  Date:  04/18/2016  Time:  2:02 AM  Type of Therapy:  Psychoeducational Skills  Participation Level:  Active  Participation Quality:  Appropriate, Attentive and Sharing  Affect:  Appropriate  Cognitive:  Appropriate  Insight:  Appropriate, Good and Improving  Engagement in Group:  Engaged  Modes of Intervention:  Discussion, Socialization and Support  Summary of Progress/Problems:  Jessica [name] 04/18/2016, 2:02 AM

## 2016-04-18 NOTE — Discharge Summary (Addendum)
Physician Discharge Summary Note  Patient:  Jessica [name] is an 41 y.o., female MRN:  [id number] DOB:  1976/01/02 Patient phone:  [phone] (home)  Patient address:   [address] [place] [address],  Total Time spent with patient: 45 minutes  Date of Admission:  03/01/2016 Date of Discharge: 04/20/16  Reason for Admission:  Psychosis, aggression   Principal Problem: Schizoaffective disorder, bipolar type [facility]) Discharge Diagnoses: Patient Active Problem List   Diagnosis Date Noted  . IBS (irritable bowel syndrome) [K58.9] 03/21/2016  . Schizoaffective disorder, bipolar type ([place]) [F25.0] 07/25/2015    History of Present Illness:  Patient is a 41 year old Caucasian female with history of schizoaffective disorder bipolar type. She was brought in by police to [facility] emergency department on January 3rd. Per the emergency departments note patient's mother called EMS after the patient grabbed her arm and wouldn't let go. Upon EMS arrival the patient was standing aggressively in the doorway and saying "Jessica [name], Jessica [name]. There is too much evil in [place]". She was uncooperative with evaluation yelling occasionally. Nurses in the ER described her as agitated and refusing to dressed out in his scrubs tangential.  It has been reported patient was noncompliant with her medications.  Per chart review she was hospitalized twice last year in behavioral health [place]. Her last admission was in June 2017. She was discharged with a diagnosis of schizoaffective disorder bipolar type. During her stay in the hospital she was placed on known emergency forced medications.  Today during assessment the patient was hyperreligious. She changed her voice during assessment and  stated  she was God talking to me. Her tone of voice was threatening and she got close to my face. The assessment and had to be terminated. Patient then refused to leave the office. Staff was called in the attempted to  encourage patient to step out of the office but she refused. Eventually staff had to perform a manual hold.  Patient during assessment did not want to tell me anything about her past psychiatric history. She said that that information was personal. She did not want to tell me who her psychiatrist was or the medication she was taking. She was unwilling to tell me she was taking medications prior to admission as she had a diagnosis of bipolar disorder.  She denied suicidality, homicidality or having auditory or visual hallucinations. She complained of having poor appetite, poor energy and poor concentration.  I was unable to obtain any information related to substance abuse or trauma  Associated Signs/Symptoms: Depression Symptoms:  insomnia, fatigue, difficulty concentrating, decreased appetite, (Hypo) Manic Symptoms:  Delusions, Labiality of Mood, Anxiety Symptoms:  uncooperative Psychotic Symptoms:  Delusions, Paranoia, PTSD Symptoms: NA Total Time spent with patient: 1 hour  Past Psychiatric History: Patient has been hospitalized several times at behavioral health in [place]. She was just discharged in July 2017.  I don't have any information regarding prior suicidal attempts or self-injurious behaviors   Past Medical History: There is no known past history of medical issues such as seizures or head trauma Past Medical History:  Diagnosis Date  . Anxiety   . Bipolar 1 disorder ([place])   . Depression   . Schizophrenia ([place])    History reviewed. No pertinent surgical history.  Family History:  Family History  Problem Relation Age of Onset  . Hypertension Mother   . Mental illness Father    Family Psychiatric  History: Unknown at this point as patient is uncooperative   Social History:  Not much information is available at this time other than she lives with her mother in [place]. 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 Marital status: Single    Spouse name: N/A  . Number of children: N/A  . Years of education: N/A   Social History Main Topics  . Smoking status: Never Smoker  . Smokeless tobacco: Never Used  . Alcohol use No  . Drug use: No  . Sexual activity: No   Other Topics Concern  . None   Social History Narrative  . None    Hospital Course:   Dramatic improvement seen today. She is smiling. She is out of her room attending groups without prompting. Reporting great benefit from medications. Hygiene and grooming are still poor.  Patient is a 41 year old Caucasian female with history of schizoaffective bipolar type. He presented to the emergency department due to aggression, agitation, and psychosis. Patient has not been compliant with medications.  For schizoaffective disorder: During the early part of the hospitalization the patient required nonemergency forced medications. Orders were given for olanzapine. This medication helped the patient improved. Once she became more cooperative and verbal, she requested to be started on Abilify and Trileptal which had been beneficial in the past. This also was confirmed with her family. Unfortunately this combination did not improve her symptomatology. Haloperidol was added but no further benefit was noted. Therefore we decided to discontinue Abilify, trileptal and haloperidol. She was tried again on olanzapine which was titrated up to 30 mg a day. The patient had partial improvement with the olanzapine. We decided to add Haldol again at a low dose.  Currently on olanzapine 30 mg daily at bedtime and Haldol 2 mg bid (increased on monday). No evidence of akathisia or dystonia. Patient is tolerating this combination quite well  Patient not interested in trying Clozaril--- this was once again revisited but patient is still not open.  For catatonia:  At this time no evidence of catatonia.   EPS: continueamantidine13m bid (now on 2 antipsychotics).   Patient require Manual Hold on 1/4 as she refused to leave the office where she was being interviewed on 1/4. No restraints since then.    During 2 different occasions the patient was started on nonemergency forced medications due to her refusal with orals antipsychotics  Labs: TSH, hemoglobin A1c and lipid panel, b12, BMP all wnl  Letter recommending guardianship  was mailed to the patient's mother on 1/11. Patient's hearing for guardianship was held on Feb 22.   [facility]. Social worker was able to obtain [place] the [place] help family pay for the first month of rent.    Patient stated in our unit for 50 days. She failed to respond to a multitude of medications. She is doing very well right now on the combination of Haldol and olanzapine. She has significantly improved as far as her communication, her attendance in group and her hygiene and grooming. She is compliant with medication she is no longer argumentative about taking them.   Patient has a very appropriate during interactions with me and the rest of the staff. She does not appear to be responding to internal stimuli, does not appear to be suspicious or paranoid.   She has good understanding of the plans for  discharge and guardianship.  She also understands our recommendation about applying for disability and Medicaid.  Today the patient denies having any auditory or visual hallucinations. There is no evidence of depression, mania or hypomania. She has been tolerating well her medications, she denies any physical complaints or side effects. She has maintained appropriate oral intake over the last several days. She has been is sleeping well. There is no evidence of sedation or drowsiness. Patient denies any suicidality or homicidality. Staff working with the patient does  not voice any concerns about her safety or the safety of others upon discharge. Staff that has worked with the patient feels that the patient has significantly improved and they feel she is safe for discharge.  Patient does not have any access to guns   14 day supply of medications will be given to the patient. Patient also will need a prescription for 30 days.   Physical Findings: AIMS:  , ,  ,  ,    CIWA:    COWS:  COWS Total Score: 1  Musculoskeletal: Strength & Muscle Tone: within normal limits Gait & Station: normal Patient leans: N/A  Psychiatric Specialty Exam: Physical Exam  Constitutional: She is oriented to person, place, and time. She appears well-developed and well-nourished.  HENT:  Head: Normocephalic and atraumatic.  Eyes: Conjunctivae and EOM are normal.  Neck: Normal range of motion.  Respiratory: Effort normal.  Musculoskeletal: Normal range of motion.  Neurological: She is alert and oriented to person, place, and time.    Review of Systems  Constitutional: Negative.   HENT: Negative.   Eyes: Negative.   Respiratory: Negative.   Cardiovascular: Negative.   Gastrointestinal: Negative.   Genitourinary: Negative.   Musculoskeletal: Negative.   Skin: Negative.   Neurological: Negative.   Endo/Heme/Allergies: Negative.   Psychiatric/Behavioral: Negative.     Blood pressure 111/71, pulse 82, temperature 98.6 F (37 C), temperature source Oral, resp. rate 18, height 5' 4"  (1.626 m), weight 90.7 kg (200 lb), last menstrual period 02/27/2016, SpO2 99 %.Body mass index is 34.33 kg/m.  General Appearance: Fairly Groomed  Eye Contact:  Good  Speech:  Clear and Coherent  Volume:  Normal  Mood:  Euthymic  Affect:  Appropriate and Congruent  Thought Process:  Linear and Descriptions of Associations: Intact  Orientation:  Full (Time, Place, and Person)  Thought Content:  Hallucinations: None  Suicidal Thoughts:  No  Homicidal Thoughts:  No  Memory:   Immediate;   Fair Recent;   Fair Remote;   Fair  Judgement:  Poor  Insight:  Shallow  Psychomotor Activity:  Normal  Concentration:  Concentration: Fair and Attention Span: Fair  Recall:  [place] of Knowledge:  Good  Language:  Good  Akathisia:  No  Handed:    AIMS (if indicated):     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 (Cigarettes, Smokeless Tobacco, Cigars, and/or Pipes): No  Has this patient used any form of tobacco in the last 30 days? (Cigarettes, Smokeless Tobacco, Cigars, and/or Pipes) Yes, No  Blood Alcohol level:  Lab Results  Component Value Date   [facility] <5 02/29/2016   ETH <5 48/18/5631    Metabolic Disorder Labs:  Lab Results  Component Value Date   HGBA1C 5.4 03/07/2016   MPG 108 03/07/2016  MPG 117 07/27/2015   Lab Results  Component Value Date   PROLACTIN 20.7 07/27/2015   Lab Results  Component Value Date   CHOL 165 03/07/2016   TRIG 67 03/07/2016   HDL 48 03/07/2016   CHOLHDL 3.4 03/07/2016   VLDL 13 03/07/2016   LDLCALC 104 (H) 03/07/2016   [place] 95 07/27/2015   Results for Daughtry, Jessica [name] (MRN [id number]) as of 04/18/2016 12:16  Ref. Range 03/07/2016 17:44 03/21/2016 91:79  BASIC METABOLIC PANEL Unknown Rpt (A) Rpt (A)  Sodium Latest Ref Range: 135 - 145 mmol/[name] 141 141  Potassium Latest Ref Range: 3.5 - 5.1 mmol/[name] 3.9 3.5  Chloride Latest Ref Range: 101 - 111 mmol/[name] 103 105  CO2 Latest Ref Range: 22 - 32 mmol/[name] 29 26  Glucose Latest Ref Range: 65 - 99 mg/dL 169 (H) 99  Mean Plasma Glucose Latest Units: mg/dL 108   BUN Latest Ref Range: 6 - 20 mg/dL 15 14  Creatinine Latest Ref Range: 0.44 - 1.00 mg/dL 0.76 0.67  Calcium Latest Ref Range: 8.9 - 10.3 mg/dL 9.1 8.6 ([name])  Anion gap Latest Ref Range: 5 - 15  9 10   EGFR (African American) Latest Ref Range: >60 mL/min >60 >60  EGFR  (Non-African Amer.) Latest Ref Range: >60 mL/min >60 >60  Total CHOL/HDL Ratio Latest Units: RATIO 3.4   Cholesterol Latest Ref Range: 0 - 200 mg/dL 165   HDL Cholesterol Latest Ref Range: >40 mg/dL 48   LDL (calc) Latest Ref Range: 0 - 99 mg/dL 104 (H)   Triglycerides Latest Ref Range: <150 mg/dL 67   VLDL Latest Ref Range: 0 - 40 mg/dL 13   Vitamin B12 Latest Ref Range: 180 - 914 pg/mL 524   Hemoglobin A1C Latest Ref Range: 4.8 - 5.6 % 5.4   TSH Latest Ref Range: 0.350 - 4.500 uIU/mL 0.943    Results for Jessica [name], Jessica [name] (MRN [id number]) as of 04/18/2016 12:16  Ref. Range 02/29/2016 14:45  Preg Test, Ur Latest Ref Range: NEGATIVE  NEGATIVE  URINALYSIS, ROUTINE W REFLEX MICROSCOPIC Unknown Rpt (A)  Appearance Latest Ref Range: CLEAR  CLEAR  Bacteria, UA Latest Ref Range: NONE SEEN  RARE (A)  Bilirubin Urine Latest Ref Range: NEGATIVE  NEGATIVE  Color, Urine Latest Ref Range: YELLOW  YELLOW  Glucose Latest Ref Range: NEGATIVE mg/dL NEGATIVE  Hgb urine dipstick Latest Ref Range: NEGATIVE  SMALL (A)  Ketones, ur Latest Ref Range: NEGATIVE mg/dL 20 (A)  Leukocytes, UA Latest Ref Range: NEGATIVE  NEGATIVE  Mucous Unknown PRESENT  Nitrite Latest Ref Range: NEGATIVE  NEGATIVE  pH Latest Ref Range: 5.0 - 8.0  5.0  Protein Latest Ref Range: NEGATIVE mg/dL NEGATIVE  RBC / HPF Latest Ref Range: 0 - 5 RBC/hpf 0-5  Specific Gravity, Urine Latest Ref Range: 1.005 - 1.030  1.020  Squamous Epithelial / LPF Latest Ref Range: NONE SEEN  0-5 (A)  WBC, UA Latest Ref Range: 0 - 5 WBC/hpf 0-5  Amphetamines Latest Ref Range: NONE DETECTED  NONE DETECTED  Barbiturates Latest Ref Range: NONE DETECTED  NONE DETECTED  Benzodiazepines Latest Ref Range: NONE DETECTED  POSITIVE (A)  Opiates Latest Ref Range: NONE DETECTED  NONE DETECTED  COCAINE Latest Ref Range: NONE DETECTED  NONE DETECTED  Tetrahydrocannabinol Latest Ref Range: NONE DETECTED  NONE DETECTED   See Psychiatric Specialty Exam and Suicide Risk  Assessment completed by Attending Physician prior to discharge.  Discharge destination:  Other:  Transitional Housing  Is patient  on multiple antipsychotic therapies at discharge:  Yes,   Do you recommend tapering to monotherapy for antipsychotics?  Yes   Has Patient had three or more failed trials of antipsychotic monotherapy by history:  Yes,   Antipsychotic medications that previously failed include:   1.  abilify., 2.  latuda. and 3.  zyprexa.  Recommended Plan for Multiple Antipsychotic Therapies: Additional reason(s) for multiple antispychotic treatment:  failure to monotherapy   Allergies as of 04/20/2016      Reactions   Prednisone Other (See Comments)   Prednisone psychosis with anxiety.   Abilify [aripiprazole] Anxiety      Medication List    STOP taking these medications   ARIPiprazole 5 MG tablet Commonly known as:  ABILIFY   doxepin 10 MG capsule Commonly known as:  SINEQUAN   hydrOXYzine 25 MG tablet Commonly known as:  ATARAX/VISTARIL   OXcarbazepine 150 MG tablet Commonly known as:  TRILEPTAL   ziprasidone 20 MG capsule Commonly known as:  GEODON     TAKE these medications     Indication  amantadine 100 MG capsule Commonly known as:  SYMMETREL Take 1 capsule (100 mg total) by mouth 2 (two) times daily.  Indication:  Extrapyramidal Reaction caused by Medications, prevent side effects   haloperidol 2 MG tablet Commonly known as:  HALDOL Take 1 tablet (2 mg total) by mouth 2 (two) times daily.  Indication:  Psychosis, SCHIZOAFFECTIVE   OLANZapine 15 MG tablet Commonly known as:  ZYPREXA Take 2 tablets (30 mg total) by mouth at bedtime. What changed:  medication strength  how much to take  Indication:  schizoaffective      Follow-up Information    [facility]. Go on 04/23/2016.   Specialty:  Behavioral Health Why:  Please go to [facility] for your medication managment appointment and intake assessment. Your appointment is on Feb. 26th at 9:45 AM.  Please bring your discharge paperwork to this appointment. If you have questions, contact [facility]. Contact information: [place] [address] [phone]        [facility] Call on 04/24/2016.   Why:  Please plan to meet with your new ACT Team at [facility].; call Tuesday morning. They will do your initial intake assessment for medication management and other services they provide. If you have questions, contact the ACTT. Contact information: Address: [address] [place], [place] [address] Phone: [phone] Fax: [phone]          >30 minutes. >50 % of the time was spent in coordination of care.   Signed: Hildred Priest, MD 05/14/2016, 11:43 AM

## 2016-04-18 NOTE — Social Work (Addendum)
CSW contacted [facility] to coordinate aftercare plans for patient. [facility] does have [facility]PRS funding and will review referral to determine if they can meet pt's need.  Pt [facility]able to [facility]/23 [facility] [place]. Pt's mother is aware and will make adjustments to pay for transitional housing as needed.   Hampton AbbotKadijah Itzel [place]ore, MSW, LCSW-A 04/18/2016, 3:32PM

## 2016-04-18 NOTE — Progress Notes (Signed)
April 18, 2016 Patient:  Drucilla ChaletJennifer L Frazee is a 41 y.o., female MRN:  [id number] DOB:  03/25/1975 Patient phone:  [phone] (home)          Patient address:   [address] [place] [id number],       To Whom It May Concern:  Miss Renato GailsReed is a 41 year old single [place]. She carries a diagnosis of schizoaffective disorder bipolar type. She was admitted to our unit on 03/01/2016 due to psychosis, aggression and bizarre behavior due to noncompliance with medications. Patient has had a multitude of other psychiatric hospitalizations under very similar circumstances. She has a history of poor insight into her illness and noncompliance after discharge.  She has failed to apply for Medicaid and disability as she does not believe she suffers from a severe mental illness. The patient has a struggle to maintain independence and keep a job for a significant period of time.   Patient has been in our psychiatric unit for 48 days. Her hospitalization has been complicated due to the fact that her symptoms are quite severe and failed to respond to monotherapy with multiple medications. She is now on a combination of 2 antipsychotics. Discharge planning has been quite challenging as the patient is currently homeless and does not have Medicaid or disability.   The treatment team that has been working with the patient since her admission has recommended guardianship for the patient. The patient's mother has been willing to do this and we provided her with a letter supporting the need for Mrs. Reed's to have a guardian.  We believe Ms. Reed's has limited medical capacity to understand the need for treatment and limited social capacity to participate actively in a realistic discharge plan.  We also believe that without significant supervision needs Reed's will not be able to maintain stability for long. Ideally, once she receives disability, she could be placed in a group home. She  is certainly unable to maintain a job and support herself.  If more information is needed about this case, please do not hesitate to contact me at [phone]   Sincerely,  Radene JourneyAndrea Hernandez MD Behavioral Health Unit [facility]

## 2016-04-18 NOTE — Tx Team (Signed)
Interdisciplinary Treatment and Diagnostic Plan Update  04/18/2016  Time of Session: 10:30am Jessica [name] MRN: [id number]  Principal Diagnosis: Schizoaffective disorder, bipolar type [facility])  Secondary Diagnoses: Principal Problem:   Schizoaffective disorder, bipolar type (HCC) Active Problems:   IBS (irritable bowel syndrome)   Current Medications:  Current Facility-Administered Medications  Medication Dose Route Frequency Provider Last Rate Last Dose  . acetaminophen (TYLENOL) tablet 650 mg  650 mg Oral Q6H PRN Jimmy Footman, MD      . alum & mag hydroxide-simeth (MAALOX/MYLANTA) 200-200-20 MG/5ML suspension 30 mL  30 mL Oral Q4H PRN Jimmy Footman, MD      . amantadine (SYMMETREL) capsule 100 mg  100 mg Oral BID Jimmy Footman, MD   100 mg at 04/18/16 4098  . feeding supplement (ENSURE ENLIVE) (ENSURE ENLIVE) liquid 237 mL  237 mL Oral TID PRN Jimmy Footman, MD      . haloperidol (HALDOL) tablet 1 mg  1 mg Oral Daily Jimmy Footman, MD   1 mg at 04/16/16 1191  . haloperidol (HALDOL) tablet 2 mg  2 mg Oral BID Jimmy Footman, MD   2 mg at 04/18/16 4782  . magnesium hydroxide (MILK OF MAGNESIA) suspension 30 mL  30 mL Oral Daily PRN Jimmy Footman, MD   30 mL at 04/14/16 1650  . OLANZapine (ZYPREXA) tablet 30 mg  30 mg Oral QHS Jimmy Footman, MD   30 mg at 04/17/16 2108   PTA Medications: Prescriptions Prior to Admission  Medication Sig Dispense Refill Last Dose  . ARIPiprazole (ABILIFY) 5 MG tablet Take 5 mg by mouth daily.   02/29/2016 at Unknown time  . doxepin (SINEQUAN) 10 MG capsule Take 2 capsules (20 mg total) by mouth at bedtime as needed (sleep). 30 capsule 0 unknown  . hydrOXYzine (ATARAX/VISTARIL) 25 MG tablet Take 1 tablet (25 mg total) by mouth every 6 (six) hours as needed for anxiety. 30 tablet 0 unknown  . OLANZapine (ZYPREXA) 2.5 MG tablet Take 5 tablets (12.5 mg total) by  mouth at bedtime. (Patient not taking: Reported on 02/29/2016) 30 tablet 0 Not Taking at Unknown time  . OXcarbazepine (TRILEPTAL) 150 MG tablet Take 1 tablet (150 mg total) by mouth 2 (two) times daily. 60 tablet 0 02/29/2016 at Unknown time  . ziprasidone (GEODON) 20 MG capsule Take 1 capsule (20 mg total) by mouth 2 (two) times daily as needed (SEVERE ANXIETY/AGITATION). 30 capsule 0 unknown    Patient Stressors: Financial difficulties Marital or family conflict Medication change or noncompliance  Patient Strengths: Ability for Warden/ranger for treatment/growth  Treatment Modalities: Medication Management, Group therapy, Case management,  1 to 1 session with clinician, Psychoeducation, Recreational therapy.   Physician Treatment Plan for Primary Diagnosis: Schizoaffective disorder, bipolar type (HCC) Long Term Goal(s): Improvement in symptoms so as ready for discharge Improvement in symptoms so as ready for discharge   Short Term Goals: Ability to identify changes in lifestyle to reduce recurrence of condition will improve Ability to demonstrate self-control will improve Ability to identify and develop effective coping behaviors will improve Compliance with prescribed medications will improve Ability to identify triggers associated with substance abuse/mental health issues will improve Ability to identify changes in lifestyle to reduce recurrence of condition will improve Ability to verbalize feelings will improve Ability to demonstrate self-control will improve Compliance with prescribed medications will improve Ability to identify triggers associated with substance abuse/mental health issues will improve  Medication Management: Evaluate patient's response, side effects, and tolerance of  medication regimen.  Therapeutic Interventions: 1 to 1 sessions, Unit Group sessions and Medication administration.  Evaluation of Outcomes: Progressing  Physician  Treatment Plan for Secondary Diagnosis: Principal Problem:   Schizoaffective disorder, bipolar type (HCC) Active Problems:   IBS (irritable bowel syndrome)  Long Term Goal(s): Improvement in symptoms so as ready for discharge Improvement in symptoms so as ready for discharge   Short Term Goals: Ability to identify changes in lifestyle to reduce recurrence of condition will improve Ability to demonstrate self-control will improve Ability to identify and develop effective coping behaviors will improve Compliance with prescribed medications will improve Ability to identify triggers associated with substance abuse/mental health issues will improve Ability to identify changes in lifestyle to reduce recurrence of condition will improve Ability to verbalize feelings will improve Ability to demonstrate self-control will improve Compliance with prescribed medications will improve Ability to identify triggers associated with substance abuse/mental health issues will improve     Medication Management: Evaluate patient's response, side effects, and tolerance of medication regimen.  Therapeutic Interventions: 1 to 1 sessions, Unit Group sessions and Medication administration.  Evaluation of Outcomes: Progressing   RN Treatment Plan for Primary Diagnosis: Schizoaffective disorder, bipolar type (HCC) Long Term Goal(s): Knowledge of disease and therapeutic regimen to maintain health will improve  Short Term Goals: Ability to remain free from injury will improve, Ability to demonstrate self-control, Ability to disclose and discuss suicidal ideas and Compliance with prescribed medications will improve  Medication Management: RN will administer medications as ordered by provider, will assess and evaluate patient's response and provide education to patient for prescribed medication. RN will report any adverse and/or side effects to prescribing provider.  Therapeutic Interventions: 1 on 1 counseling  sessions, Psychoeducation, Medication administration, Evaluate responses to treatment, Monitor vital signs and CBGs as ordered, Perform/monitor CIWA, COWS, AIMS and Fall Risk screenings as ordered, Perform wound care treatments as ordered.  Evaluation of Outcomes: Progressing   LCSW Treatment Plan for Primary Diagnosis: Schizoaffective disorder, bipolar type (HCC) Long Term Goal(s): Safe transition to appropriate next level of care at discharge, Engage patient in therapeutic group addressing interpersonal concerns.  Short Term Goals: Engage patient in aftercare planning with referrals and resources, Increase social support, Facilitate acceptance of mental health diagnosis and concerns and Increase skills for wellness and recovery  Therapeutic Interventions: Assess for all discharge needs, 1 to 1 time with Social worker, Explore available resources and support systems, Assess for adequacy in community support network, Educate family and significant other(s) on suicide prevention, Complete Psychosocial Assessment, Interpersonal group therapy.  Evaluation of Outcomes: Progressing   Progress in Treatment: Attending groups: Yes. Participating in groups: Yes. Taking medication as prescribed: Yes. Toleration medication: Yes. Family/Significant other contact made: Yes, CSW spoke with patient's mother, Jeraldean Wechter. Patient understands diagnosis: Yes. Discussing patient identified problems/goals with staff: Yes. Medical problems stabilized or resolved: Yes. Denies suicidal/homicidal ideation: Yes. Issues/concerns per patient self-inventory: No. Other: n/a  New problem(s) identified: None identified at this time.   New Short Term/Long Term Goal(s): None identified at this time.   Discharge Plan or Barriers: Patient will go to [facility] transitional house. CSW still assessing [employer] funding for ACTT services.  Reason for Continuation of Hospitalization: Anxiety Depression Medication  stabilization  Estimated Length of Stay: 7 days.   Attendees: Patient: Jessica [name] 04/18/2016 11:00 AM  Physician: Dr. Radene JourneyJayme Cloud, MD 04/18/2016 11:00 AM  Nursing: Hulan Amato, RN 04/18/2016 11:00 AM  RN Care Manager: 04/18/2016 11:00 AM  Social Worker: Hampton AbbotKadijah Ollie Delano, MSW, LCSWA 04/18/2016 11:00 AM  Recreational Therapist: Jacquelynn CreeElizabeth M. Greene, LRT/CTRS 04/18/2016 11:00 AM   Scribe for Treatment Team: Lynden OxfordKadijah R Marcedes Tech, LCSWA 04/18/2016 11:00 AM

## 2016-04-18 NOTE — [facility] Suicide Risk Assessment (Signed)
[facility]   Principal Problem: Schizoaffective disorder, bipolar type [facility](HCC) Discharge Diagnoses:  Patient Active Problem List   Diagnosis Date Noted  . IBS (irritable bowel syndrome) [K58.9] 03/21/2016  . Schizoaffective disorder, bipolar type (HCC) [F25.0] 07/25/2015      Psychiatric Specialty Exam: ROS  Blood pressure 118/73, pulse 88, temperature 98.5 F (36.9 C), resp. rate 18, height 5\' 4"  (1.626 m), weight 90.7 kg (200 lb), last menstrual period 02/27/2016, SpO2 98 %.Body mass index is 34.33 kg/m.                                                       Mental Status Per Nursing Assessment::   On Admission:  NA  Demographic Factors:  Caucasian, Low socioeconomic status, Living alone and Unemployed  Loss Factors: Decrease in vocational status and Financial problems/change in socioeconomic status  Historical Factors: Family history of mental illness or substance abuse  Risk Reduction Factors:   Sense of responsibility to family and Positive social support  No access to guns   Continued Clinical Symptoms:  Schizophrenia:   Less than 40 years old Previous Psychiatric Diagnoses and Treatments  Cognitive Features That Contribute To Risk:  Loss of executive function    Suicide Risk:  Minimal: No identifiable suicidal ideation.  Patients presenting with no risk factors but with morbid ruminations; may be classified as minimal risk based on the severity of the depressive symptoms  Follow-up Information    [facility].   Why:  Follow-up appointment on this date with Dr. Omelia BlackwaterHeaden at 11:00am for outpatient services.  Contact information: [address] [place], [place] [address] P: [phone] F:             Jimmy FootmanHernandez-Gonzalez,  Rashawd Laskaris, MD 04/18/2016, 3:36 PM

## 2016-04-18 NOTE — [facility] Group Notes (Signed)
[facility] LCSW Group Therapy  04/18/2016 2:05 PM  Type of Therapy:  Group Therapy  Participation Level:  Patient did not attend group. CSW invited patient to group.   Summary of Progress/Problems: Emotional Regulation: Patients will identify both negative and positive emotions. They will discuss emotions they have difficulty regulating and how they impact their lives. Patients will be asked to identify healthy coping skills to combat unhealthy reactions to negative emotions.    Terek Bee [name] MSW, LCSWA 04/18/2016, 2:05 PM

## 2016-04-18 NOTE — Progress Notes (Signed)
Patient is pleasant on approach and keeps eye contact.Denies suicidal or homicidal ideations & AV hallucinations.Visible in the milieu for short period of time.Attended one group.Compliant with medications.Appetite & energy level good.Support & encouragement given.Safety maintained.

## 2016-04-19 NOTE — Progress Notes (Signed)
Recreation Therapy Notes  Date: 02.22.18 Time: 1:00 pm Location: Craft Room  Group Topic: Leisure Education  Goal Area(s) Addresses:  Patient will identify activities for each letter of the alphabet. Patient will verbalize ability to integrate positive leisure into life post d/c. Patient will verbalize ability to use leisure as a Associate Professorcoping skill.  Behavioral Response: Did not attend  Intervention: Leisure Alphabet  Activity: Patients were given a Leisure Information systems managerAlphabet worksheet and were instructed to write healthy leisure activities for each letter of the alphabet.  Education: LRT educated patients on what they need to participate in leisure.  Education Outcome: Patient did not attend group.  Clinical Observations/Feedback: Patient did not attend group.  Jacquelynn CreeGreene,Kathan Kirker M, LRT/CTRS 04/19/2016 2:03 PM

## 2016-04-19 NOTE — Progress Notes (Signed)
D: Pt denies SI/HI/AVH. Pt is pleasant and cooperative, affect is bright, thoughts are organized, no bizarre behavior noted. Pt stated "she feels better from taking her medication" Patient appears less anxious and she is interacting with peers and staff appropriately.  A: Pt was offered support and encouragement. Pt was given scheduled medications. Pt was encouraged to attend groups. Q 15 minute checks were done for safety.  R:Pt did not attends group. Pt is taking medication. Pt has no complaints.Pt receptive to treatment and safety maintained on unit.

## 2016-04-19 NOTE — [facility] Group Notes (Signed)
[facility] LCSW Group Therapy  04/19/2016 11:15 AM  Type of Therapy:  Group Therapy  Participation Level:  Active  Participation Quality:  Attentive and Sharing  Affect:  Appropriate  Cognitive:  Alert  Insight:  Developing/Improving  Engagement in Therapy:  Developing/Improving  Modes of Intervention:  Activity, Discussion, Education, Problem-solving, Reality Testing and Support  Summary of Progress/Problems: Balance in life: Patients will discuss the concept of balance and how it looks and feels to be unbalanced. Pt will identify areas in their life that is unbalanced and ways to become more balanced. They discussed what aspects in their lives has influenced their self care. Patients also discussed self care in the areas of self regulation/control, hygiene/appearance, sleep/relaxation, healthy leisure, healthy eating habits, exercise, inner peace/spirituality, self improvement, sobriety, and health management. They were challenged to identify changes that are needed in order to improve self care. Patient stated she needs to improve her health management by continuing to take her medications and follow-up with her psychiatrist and therapist.   Fredrich Birksmaris [name] MSW, LCSWA 04/19/2016, 11:20 AM

## 2016-04-19 NOTE — Progress Notes (Signed)
[facility] MD Progress Note  04/19/2016 9:49 AM Jessica [name]  MRN:  [id number] Subjective:   Patient is a 41 year old Caucasian female with history of schizoaffective disorder bipolar type. She was brought in by police to [facility] emergency department on January 3rd. Per the emergency departments note patient's mother called EMS after the patient grabbed her arm and wouldn't let go. Upon EMS arrival the patient was standing aggressively in the doorway and saying "[place], [place]. There is too much evil in [place]". She was uncooperative with evaluation yelling occasionally. Nurses in the ER described her as agitated and refusing to dressed out in his scrubs tangential.  Per chart review she was hospitalized twice last year in behavioral health [place]. Her last admission was in June 2017. She was discharged with a diagnosis of schizoaffective disorder bipolar type. During her stay in the hospital she was placed on known emergency forced medications.  2/19 patient says she is feeling well and denies having any auditory or visual hallucinations. She says that she feels cloudy and thinks is because of her medications. She denies problems with appetite or sleep. Says that her energy is low which he also attributes to taking her medications. She does not appear to be having any EPS. She was cooperative during assessment but still vague and somewhat suspicious. Hygiene and grooming are still quite poor not improve. Still wearing the same clothes and is still has to watch her hair.  Has been compliant with medications. Oral intake is good. 0% participation in groups  2/20 this morning without prompting patient was found attending group. Later on in the morning she was found sitting in a second group. She says smiling, she has appropriate affect. She has good eye contact. A very drastic change today. Patient denies side effects. Denies physical complaints. Denies hallucinations. Denies HI, SI, problems  with mood, appetite, energy is sleep or concentration.  2/21  grooming and hygiene are much improved. Patient has been attending groups without prompting. She has a better eye contact, her affect is much more brighter and reactive, appropriate. There's no evidence of delusions. She denies having hallucinations. She is compliant with medications and is states that she is not having any side effects.   2/22 patient is very excited about possible discharge plans for tomorrow. She says she is very nervous though. She denies major side effects from medications. She denies suicidality homicidality or auditory or visual hallucinations.  Per nursing: D: Pt denies SI/HI/AVH. Pt is pleasant and cooperative, affect is bright, thoughts are organized, no bizarre behavior noted. Pt stated "she feels better from taking her medication" Patient appears less anxious and she is interacting with peers and staff appropriately.  A: Pt was offered support and encouragement. Pt was given scheduled medications. Pt was encouraged to attend groups. Q 15 minute checks were done for safety.  R:Pt did not attends group. Pt is taking medication. Pt has no complaints.Pt receptive to treatment and safety maintained on unit.  Principal Problem: Schizoaffective disorder, bipolar type (HCC) Diagnosis:   Patient Active Problem List   Diagnosis Date Noted  . IBS (irritable bowel syndrome) [K58.9] 03/21/2016  . Schizoaffective disorder, bipolar type (HCC) [F25.0] 07/25/2015   Total Time spent with patient: 30 minutes  Past Psychiatric History: Patient has been hospitalized several times at behavioral health in [place]. She was just discharged in July 2017.  I don't have any information regarding prior suicidal attempts or self-injurious behaviors   Past Medical History:  Past Medical History:  Diagnosis Date  . Anxiety   . Bipolar 1 disorder (HCC)   . Depression   . Schizophrenia (HCC)    History reviewed. No pertinent  surgical history. Family History:  Family History  Problem Relation Age of Onset  . Hypertension Mother   . Mental illness Father    Family Psychiatric  History:  Unknown at this point as patient is uncooperative   Social History:Not much information is available at this time other than she lives with her mother in [place]. 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 Marital status: Single    Spouse name: N/A  . Number of children: N/A  . Years of education: N/A   Social History Main Topics  . Smoking status: Never Smoker  . Smokeless tobacco: Never Used  . Alcohol use No  . Drug use: No  . Sexual activity: No   Other Topics Concern  . None   Social History Narrative  . None   Additional Social History:    Pain Medications: none Prescriptions: none Over the Counter: none History of alcohol / drug use?: No history of alcohol / drug abuse Negative Consequences of Use: Personal relationships, Financial, Work / School Withdrawal Symptoms: Other (Comment)       Current Medications: Current Facility-Administered Medications  Medication Dose Route Frequency Provider Last Rate Last Dose  . acetaminophen (TYLENOL) tablet 650 mg  650 mg Oral Q6H PRN Jimmy Footman, MD      . alum & mag hydroxide-simeth (MAALOX/MYLANTA) 200-200-20 MG/5ML suspension 30 mL  30 mL Oral Q4H PRN Jimmy Footman, MD      . amantadine (SYMMETREL) capsule 100 mg  100 mg Oral BID Jimmy Footman, MD   100 mg at 04/19/16 0810  . feeding supplement (ENSURE ENLIVE) (ENSURE ENLIVE) liquid 237 mL  237 mL Oral TID PRN Jimmy Footman, MD      . haloperidol (HALDOL) tablet 2 mg  2 mg Oral BID Jimmy Footman, MD   2 mg at 04/19/16 7425  . magnesium hydroxide (MILK OF MAGNESIA) suspension 30 mL  30 mL Oral  Daily PRN Jimmy Footman, MD   30 mL at 04/14/16 1650  . OLANZapine (ZYPREXA) tablet 30 mg  30 mg Oral QHS Jimmy Footman, MD   30 mg at 04/18/16 2137    Lab Results: No results found for this or any previous visit (from the past 48 hour(s)).  Blood Alcohol level:  Lab Results  Component Value Date   ETH <5 02/29/2016   ETH <5 08/03/2015    Metabolic Disorder Labs: Lab Results  Component Value Date   HGBA1C 5.4 03/07/2016   MPG 108 03/07/2016   MPG 117 07/27/2015   Lab Results  Component Value Date   PROLACTIN 20.7 07/27/2015   Lab Results  Component Value Date   CHOL 165 03/07/2016   TRIG 67 03/07/2016   HDL 48 03/07/2016   CHOLHDL 3.4 03/07/2016   VLDL 13 03/07/2016   LDLCALC 104 (H) 03/07/2016   LDLCALC 95 07/27/2015    Physical Findings: AIMS:  , ,  ,  ,    CIWA:    COWS:  COWS Total Score: 1  Musculoskeletal: Strength & Muscle Tone: within normal limits Gait & Station: patient sat in bed Patient leans: N/A  Psychiatric Specialty  Exam: Physical Exam  Constitutional: She is oriented to person, place, and time. She appears well-developed and well-nourished.  HENT:  Head: Normocephalic and atraumatic.  Eyes: Conjunctivae and EOM are normal.  Neck: Normal range of motion.  Respiratory: Effort normal.  Musculoskeletal: Normal range of motion.  Neurological: She is alert and oriented to person, place, and time.  Psychiatric: Her speech is normal. Her mood appears not anxious. Her affect is not angry, not blunt, not labile and not inappropriate. She is not agitated, not aggressive, not hyperactive, not slowed, not withdrawn, not actively hallucinating and not combative. Thought content is not delusional. Cognition and memory are not impaired. She does not express impulsivity or inappropriate judgment. She does not exhibit a depressed mood. She expresses no homicidal and no suicidal ideation. She expresses no suicidal plans and no homicidal  plans. She exhibits normal recent memory and normal remote memory. She is attentive.    Review of Systems  Constitutional: Negative.   HENT: Negative.   Eyes: Negative.   Respiratory: Negative.   Cardiovascular: Negative.   Gastrointestinal: Negative.   Genitourinary: Negative.   Musculoskeletal: Negative.   Skin: Negative.   Neurological: Negative.   Endo/Heme/Allergies: Negative.   Psychiatric/Behavioral: Negative.  Negative for depression, hallucinations, memory loss, substance abuse and suicidal ideas. The patient is not nervous/anxious and does not have insomnia.     Blood pressure 108/73, pulse 79, temperature 98.7 F (37.1 C), resp. rate 18, height 5\' 4"  (1.626 m), weight 90.7 kg (200 lb), last menstrual period 02/27/2016, SpO2 98 %.Body mass index is 34.33 kg/m.  General Appearance: Disheveled,   Eye Contact:  Good  Speech:  Clear and Coherent  Volume:  Normal  Mood:  Euthymic   Affect:  Appropriate and Congruent  Thought Process:  Goal Directed, Linear and Descriptions of Associations: Intact   Orientation:  Full (Time, Place, and Person)  Thought Content:  Hallucinations: None   Suicidal Thoughts:  No  Homicidal Thoughts:  No  Memory:  Immediate;   Fair Recent;   Fair Remote;   Fair  Judgement:  improving  Insight:  Shallow  Psychomotor Activity:  patient sat up in bed during interview  Concentration:  Concentration: Fair and Attention Span: Fair  Recall:  [employer] of Knowledge:  Fair  Language:  better  Akathisia:  No  Handed:   AIMS (if indicated):     Assets:    ADL's:  Intact  Cognition:  WNL  Sleep:  Number of Hours: 8   Treatment Plan Summary:  Dramatic improvement seen today. She is smiling. She is out of her room attending groups without prompting. Reporting great benefit from medications. Hygiene and grooming are still poor.  Patient is a 41 year old Caucasian female with history of schizoaffective bipolar type. He presented to the emergency  department due to aggression, agitation, and psychosis. Patient has not been compliant with medications.  For schizoaffective disorder: During the early part of the hospitalization the patient required nonemergency forced medications. Orders were given for olanzapine. This medication helped the patient improved. Once she became more cooperative and verbal, she requested to be started on Abilify and Trileptal which had been beneficial in the past. This also was confirmed with her family. Unfortunately this combination did not improve her symptomatology. Haloperidol was added but no further benefit was noted. Therefore we decided to discontinue Abilify, trileptal and haloperidol. She was tried again on olanzapine which was titrated up to 30 mg a day. The patient had partial  improvement with the olanzapine. We decided to add Haldol again at a low dose.  Currently on olanzapine 30 mg daily at bedtime and Haldol 2 mg bid (increased on monday)   Patient not interested in trying Clozaril--- this was once again revisited but patient is still not open.  For catatonia: At this time no evidence of catatonia. Ativan has been gradually decreased and was discontinued  last week  EPS: continueamantidine100mg  bid (now on 2 antipsychotics).   Patient require Manual Hold on 1/4 as she refused to leave the office where she was being interviewed on 1/4. No restraints since then.    During 2 different occasions the patient was started on nonemergency forced medications due to her refusal with orals antipsychotics  Diet: regular  Vital signs daily--- stable vital signs today  Oral intake is good  Precautions every 15 minute checks  Labs: TSH, hemoglobin A1c and lipid panel, b12, BMP all wnl  Hospitalization status continue involuntary commitment.   Letter recommending guardianship  was mailed to the patient's mother on 1/11. Patient's hearing for guardianship has been is scheduled for today February  22.  [facility]. Social worker was able to obtain $200 from the charity foundation to help family pay for the first month of rent. Social worker is trying to that the patient connected with [facility] through [employer].   Jimmy FootmanHernandez-Gonzalez,  Akshita Italiano, MD 04/19/2016, 9:49 AM

## 2016-04-19 NOTE — Progress Notes (Signed)
Per self inventory rates depression as 1/10 and anxiety as 2/10.  Denies SI/HI/AVH.  Pleasant and cooperative.  Verbalizes feelings and concerns.  More visible in the milieu.  Maintaining personal care chores.  Support and encouragement offered.  Safety maintained.

## 2016-04-19 NOTE — [facility] Group Notes (Signed)
[facility] LCSW Group Therapy Note  Type of Therapy and Topic:  Group Therapy:  Goals Group: SMART Goals  Participation Level:  Patient attended group on this date. Patient participated in goal setting and was able to share openly with the group.   Description of Group:   The purpose of a daily goals group is to assist and guide patients in setting recovery/wellness-related goals.  The objective is to set goals as they relate to the crisis in which they were admitted. Patients will be using SMART goal modalities to set measurable goals.  Characteristics of realistic goals will be discussed and patients will be assisted in setting and processing how one will reach their goal. Facilitator will also assist patients in applying interventions and coping skills learned in psycho-education groups to the SMART goal and process how one will achieve defined goal.  Therapeutic Goals: -Patients will develop and document one goal related to or their crisis in which brought them into treatment. -Patients will be guided by LCSW using SMART goal setting modality in how to set a measurable, attainable, realistic and time sensitive goal.  -Patients will process barriers in reaching goal. -Patients will process interventions in how to overcome and successful in reaching goal.   Summary of Patient Progress:  Patient Goal: "I want to attend groups". Patient states attending groups have helped her develop coping skills.    Therapeutic Modalities:   Motivational Interviewing Engineer, manufacturing systemsCognitive Behavioral Therapy Crisis Intervention Model SMART goals setting  Talar Fraley [name] MSW, [facility] HospitalCSWA 04/19/2016 10:45 AM

## 2016-04-20 NOTE — Tx Team (Signed)
Interdisciplinary Treatment and Diagnostic Plan Update  04/20/2016  Time of Session: 10:30am MILIYAH [name] MRN: [id number]  Principal Diagnosis: Schizoaffective disorder, bipolar type [facility])  Secondary Diagnoses: Principal Problem:   Schizoaffective disorder, bipolar type (HCC) Active Problems:   IBS (irritable bowel syndrome)   Current Medications:  Current Facility-Administered Medications  Medication Dose Route Frequency Provider Last Rate Last Dose  . acetaminophen (TYLENOL) tablet 650 mg  650 mg Oral Q6H PRN Jimmy Footman, MD      . alum & mag hydroxide-simeth (MAALOX/MYLANTA) 200-200-20 MG/5ML suspension 30 mL  30 mL Oral Q4H PRN Jimmy Footman, MD      . amantadine (SYMMETREL) capsule 100 mg  100 mg Oral BID Jimmy Footman, MD   100 mg at 04/20/16 0825  . feeding supplement (ENSURE ENLIVE) (ENSURE ENLIVE) liquid 237 mL  237 mL Oral TID PRN Jimmy Footman, MD      . haloperidol (HALDOL) tablet 2 mg  2 mg Oral BID Jimmy Footman, MD   2 mg at 04/20/16 0825  . magnesium hydroxide (MILK OF MAGNESIA) suspension 30 mL  30 mL Oral Daily PRN Jimmy Footman, MD   30 mL at 04/14/16 1650  . OLANZapine (ZYPREXA) tablet 30 mg  30 mg Oral QHS Jimmy Footman, MD   30 mg at 04/19/16 2150   PTA Medications: Prescriptions Prior to Admission  Medication Sig Dispense Refill Last Dose  . ARIPiprazole (ABILIFY) 5 MG tablet Take 5 mg by mouth daily.   02/29/2016 at Unknown time  . doxepin (SINEQUAN) 10 MG capsule Take 2 capsules (20 mg total) by mouth at bedtime as needed (sleep). 30 capsule 0 unknown  . hydrOXYzine (ATARAX/VISTARIL) 25 MG tablet Take 1 tablet (25 mg total) by mouth every 6 (six) hours as needed for anxiety. 30 tablet 0 unknown  . OLANZapine (ZYPREXA) 2.5 MG tablet Take 5 tablets (12.5 mg total) by mouth at bedtime. (Patient not taking: Reported on 02/29/2016) 30 tablet 0 Not Taking at Unknown time  .  OXcarbazepine (TRILEPTAL) 150 MG tablet Take 1 tablet (150 mg total) by mouth 2 (two) times daily. 60 tablet 0 02/29/2016 at Unknown time  . ziprasidone (GEODON) 20 MG capsule Take 1 capsule (20 mg total) by mouth 2 (two) times daily as needed (SEVERE ANXIETY/AGITATION). 30 capsule 0 unknown    Patient Stressors: Financial difficulties Marital or family conflict Medication change or noncompliance  Patient Strengths: Ability for Warden/ranger for treatment/growth  Treatment Modalities: Medication Management, Group therapy, Case management,  1 to 1 session with clinician, Psychoeducation, Recreational therapy.   Physician Treatment Plan for Primary Diagnosis: Schizoaffective disorder, bipolar type (HCC) Long Term Goal(s): Improvement in symptoms so as ready for discharge Improvement in symptoms so as ready for discharge   Short Term Goals: Ability to identify changes in lifestyle to reduce recurrence of condition will improve Ability to demonstrate self-control will improve Ability to identify and develop effective coping behaviors will improve Compliance with prescribed medications will improve Ability to identify triggers associated with substance abuse/mental health issues will improve Ability to identify changes in lifestyle to reduce recurrence of condition will improve Ability to verbalize feelings will improve Ability to demonstrate self-control will improve Compliance with prescribed medications will improve Ability to identify triggers associated with substance abuse/mental health issues will improve  Medication Management: Evaluate patient's response, side effects, and tolerance of medication regimen.  Therapeutic Interventions: 1 to 1 sessions, Unit Group sessions and Medication administration.  Evaluation of Outcomes: Adequate for discharge  Physician Treatment Plan for Secondary Diagnosis: Principal Problem:   Schizoaffective disorder, bipolar  type (HCC) Active Problems:   IBS (irritable bowel syndrome)  Long Term Goal(s): Improvement in symptoms so as ready for discharge Improvement in symptoms so as ready for discharge   Short Term Goals: Ability to identify changes in lifestyle to reduce recurrence of condition will improve Ability to demonstrate self-control will improve Ability to identify and develop effective coping behaviors will improve Compliance with prescribed medications will improve Ability to identify triggers associated with substance abuse/mental health issues will improve Ability to identify changes in lifestyle to reduce recurrence of condition will improve Ability to verbalize feelings will improve Ability to demonstrate self-control will improve Compliance with prescribed medications will improve Ability to identify triggers associated with substance abuse/mental health issues will improve     Medication Management: Evaluate patient's response, side effects, and tolerance of medication regimen.  Therapeutic Interventions: 1 to 1 sessions, Unit Group sessions and Medication administration.  Evaluation of Outcomes: Adequate for discharge    RN Treatment Plan for Primary Diagnosis: Schizoaffective disorder, bipolar type (HCC) Long Term Goal(s): Knowledge of disease and therapeutic regimen to maintain health will improve  Short Term Goals: Ability to remain free from injury will improve, Ability to demonstrate self-control, Ability to disclose and discuss suicidal ideas and Compliance with prescribed medications will improve  Medication Management: RN will administer medications as ordered by provider, will assess and evaluate patient's response and provide education to patient for prescribed medication. RN will report any adverse and/or side effects to prescribing provider.  Therapeutic Interventions: 1 on 1 counseling sessions, Psychoeducation, Medication administration, Evaluate responses to treatment,  Monitor vital signs and CBGs as ordered, Perform/monitor CIWA, COWS, AIMS and Fall Risk screenings as ordered, Perform wound care treatments as ordered.  Evaluation of Outcomes: Adequate for discharge.   LCSW Treatment Plan for Primary Diagnosis: Schizoaffective disorder, bipolar type (HCC) Long Term Goal(s): Safe transition to appropriate next level of care at discharge, Engage patient in therapeutic group addressing interpersonal concerns.  Short Term Goals: Engage patient in aftercare planning with referrals and resources, Increase social support, Facilitate acceptance of mental health diagnosis and concerns and Increase skills for wellness and recovery  Therapeutic Interventions: Assess for all discharge needs, 1 to 1 time with Social worker, Explore available resources and support systems, Assess for adequacy in community support network, Educate family and significant other(s) on suicide prevention, Complete Psychosocial Assessment, Interpersonal group therapy.  Evaluation of Outcomes: Adequate for discharge.   Progress in Treatment: Attending groups: Yes. Participating in groups: Yes. Taking medication as prescribed: Yes. Toleration medication: Yes. Family/Significant other contact made: Yes, CSW spoke with patient's mother, Jessica [name]. Patient understands diagnosis: Yes. Discussing patient identified problems/goals with staff: Yes. Medical problems stabilized or resolved: Yes. Denies suicidal/homicidal ideation: Yes. Issues/concerns per patient self-inventory: No. Other: n/a  New problem(s) identified: None identified at this time.   New Short Term/Long Term Goal(s): None identified at this time.   Discharge Plan or Barriers: [facility] Patient will start ACTT services with [facility].  Reason for Continuation of Hospitalization: Anxiety Depression Medication stabilization  Estimated Length of Stay: 7 days.   Attendees: Patient:  Jessica [name] 04/20/2016 10:53 AM  Physician: Dr. Radene JourneyAndrea HernandezJayme Cloud- Gonzalez, MD 04/20/2016 10:53 AM  Nursing: Leonia ReaderPhyllis Cobb, RN 04/20/2016 10:53 AM  RN Care Manager: 04/20/2016 10:53 AM  Social Worker: Hampton AbbotKadijah Collyn [name], MSW, LCSWA 04/20/2016 10:53 AM  Recreational Therapist: Tennis MustElizabeth M.  Neva [name], LRT/CTRS 04/20/2016 10:53 AM   Scribe for Treatment Team: Jessica [name], LCSWA 04/20/2016 10:53 AM

## 2016-04-20 NOTE — Social Work (Signed)
Patient referred to [facility] ACT Team and has been accepted. [facility] Team will conduct an assessment with patient Monday, 2/26 in the afternoon. CSW made transitional housing Interior and spatial designerdirector, [facility] aware.   Hampton AbbotKadijah Shoshannah Faubert, MSW, LCSW-A 04/20/2016, 12:14 PM

## 2016-04-20 NOTE — Progress Notes (Signed)
Recreation Therapy Notes  Date: 02.23.18 Time: 1:00 pm Location: Craft Room  Group Topic: Social Skills  Goal Area(s) Addresses:  Patient will effectively work with peers towards shared goal. Patient will identify skill used to make activity successful. Patient will identify how skills used during activity can be used to reach post d/c. goals.  Behavioral Response: Did not attend  Intervention: Life Boat  Activity: Patients were given a list of 16 people Ship broker([employer], Runner, broadcasting/film/videoteacher, doctor, etc.) and were told they had to pick 8 people to be on a bigger, faster boat with them and 8 people who would be on an inflatable boat.  Education: LRT educated patients on healthy support systems.  Education Outcome: Patient did not attend group.   Clinical Observations/Feedback: Patient did not attend group.  Jacquelynn CreeGreene,Jazlene Bares M, LRT/CTRS 04/20/2016 2:05 PM

## 2016-04-20 NOTE — Plan of Care (Signed)
Problem: Education: Goal: Will be free of psychotic symptoms Outcome: Progressing Patient is free of psychotic features

## 2016-04-20 NOTE — [facility] Group Notes (Signed)
[facility] Group Notes:  (Nursing/MHT/Case Management/Adjunct)  Date:  04/20/2016  Time:  12:12 AM  Type of Therapy:  Psychoeducational Skills  Participation Level:  Did Not Attend  Participation Quality: Summary of Progress/Problems:  Mayra NeerJackie L Jerre Diguglielmo 04/20/2016, 12:12 AM

## 2016-04-20 NOTE — Progress Notes (Signed)
  [facility] Management Discharge Plan :  Will you be returning to the same living situation after discharge:  No. At discharge, do you have transportation home?: Yes,  group home director will pick pt up. Do you have the ability to pay for your medications: Yes,  insurance coverage.  Release of information consent forms completed and in the chart;  Patient's signature needed at discharge.  Patient to Follow up at: Follow-up Information    [facility]. Go on 04/23/2016.   Specialty:  Behavioral Health Why:  [facility] for [facility] medication managment appointment and intake assessment. Your appointment is on Feb. 26th at 9:45 AM. Please bring your discharge paperwork to this appointment. If you have questions, contact [facility]. Contact information: [address] [place] [address] [phone]        [facility] Call on 04/24/2016.   Why:  Please plan to meet with your [facility] call Tuesday morning. They will do your initial intake assessment for medication management and other services they provide. If you have questions, contact the ACTT. Contact information: Address: [address] [place], [place] [address] Phone: [phone] Fax: [phone]) [phone]          Next level of care provider has [facility] Link:no  Safety Planning and Suicide Prevention discussed: Yes,  SPE completed with patient.  Have you used any form of tobacco in the last 30 days? (Cigarettes, Smokeless Tobacco, Cigars, and/or Pipes): No  Has patient been referred to the Quitline?: N/A patient is not a smoker  Patient has been referred for addiction treatment: N/A  Jessica OxfordKadijah R Nadja [name], MSW, LCSW-A 04/20/2016, 11:34 AM

## 2016-04-20 NOTE — Progress Notes (Signed)
Pleasant and cooperative.  Denies SI/HI/AVH.    Discharge instructions given, verbalized understanding.  Prescriptions and seven day supply of medications given.  Personal belongings returned.  Escorted off unit by this Clinical research associatewriter to meet Jinny SandersBetty Davis to travel to new home.

## 2016-04-20 NOTE — Progress Notes (Signed)
D: Pt denies SI/HI/AVH. Pt is pleasant and cooperative, affect is bright, feels happy and elated she's getting discharged tomorrow.  Pt appears less anxious and she is interacting with peers and staff appropriately.  A: Pt was offered support and encouragement. Pt was given scheduled medications. Pt was encouraged to attend groups. Q 15 minute checks were done for safety.  R:Pt did not attend group. Pt is taking medication. Pt has no complaints.Pt receptive to treatment and safety maintained on unit.

## 2016-04-23 DIAGNOSIS — F315 Bipolar disorder, current episode depressed, severe, with psychotic features: Secondary | ICD-10-CM | POA: Diagnosis not present

## 2016-12-29 DIAGNOSIS — Z23 Encounter for immunization: Secondary | ICD-10-CM | POA: Diagnosis not present

## 2017-03-25 DIAGNOSIS — Z0000 Encounter for general adult medical examination without abnormal findings: Secondary | ICD-10-CM | POA: Diagnosis not present

## 2017-03-25 DIAGNOSIS — R635 Abnormal weight gain: Secondary | ICD-10-CM | POA: Diagnosis not present

## 2017-03-25 DIAGNOSIS — Z131 Encounter for screening for diabetes mellitus: Secondary | ICD-10-CM | POA: Diagnosis not present

## 2017-03-25 DIAGNOSIS — G8929 Other chronic pain: Secondary | ICD-10-CM | POA: Diagnosis not present

## 2017-03-25 DIAGNOSIS — Z113 Encounter for screening for infections with a predominantly sexual mode of transmission: Secondary | ICD-10-CM | POA: Diagnosis not present

## 2017-03-25 DIAGNOSIS — Z01118 Encounter for examination of ears and hearing with other abnormal findings: Secondary | ICD-10-CM | POA: Diagnosis not present

## 2017-03-25 DIAGNOSIS — Z136 Encounter for screening for cardiovascular disorders: Secondary | ICD-10-CM | POA: Diagnosis not present

## 2017-03-25 DIAGNOSIS — R030 Elevated blood-pressure reading, without diagnosis of hypertension: Secondary | ICD-10-CM | POA: Diagnosis not present

## 2017-03-25 DIAGNOSIS — Z5181 Encounter for therapeutic drug level monitoring: Secondary | ICD-10-CM | POA: Diagnosis not present

## 2017-03-25 DIAGNOSIS — R739 Hyperglycemia, unspecified: Secondary | ICD-10-CM | POA: Diagnosis not present

## 2017-04-23 DIAGNOSIS — R635 Abnormal weight gain: Secondary | ICD-10-CM | POA: Diagnosis not present

## 2017-04-23 DIAGNOSIS — Z124 Encounter for screening for malignant neoplasm of cervix: Secondary | ICD-10-CM | POA: Diagnosis not present

## 2017-04-23 DIAGNOSIS — I110 Hypertensive heart disease with heart failure: Secondary | ICD-10-CM | POA: Diagnosis not present

## 2017-04-23 DIAGNOSIS — R030 Elevated blood-pressure reading, without diagnosis of hypertension: Secondary | ICD-10-CM | POA: Diagnosis not present

## 2017-04-23 DIAGNOSIS — R202 Paresthesia of skin: Secondary | ICD-10-CM | POA: Diagnosis not present

## 2017-09-03 DIAGNOSIS — R030 Elevated blood-pressure reading, without diagnosis of hypertension: Secondary | ICD-10-CM | POA: Diagnosis not present

## 2017-09-03 DIAGNOSIS — W57XXXA Bitten or stung by nonvenomous insect and other nonvenomous arthropods, initial encounter: Secondary | ICD-10-CM | POA: Diagnosis not present

## 2017-09-19 DIAGNOSIS — R030 Elevated blood-pressure reading, without diagnosis of hypertension: Secondary | ICD-10-CM | POA: Diagnosis not present

## 2017-09-19 DIAGNOSIS — R635 Abnormal weight gain: Secondary | ICD-10-CM | POA: Diagnosis not present

## 2017-09-19 DIAGNOSIS — R202 Paresthesia of skin: Secondary | ICD-10-CM | POA: Diagnosis not present

## 2018-09-09 DIAGNOSIS — F250 Schizoaffective disorder, bipolar type: Secondary | ICD-10-CM | POA: Diagnosis not present

## 2018-09-09 DIAGNOSIS — Z79899 Other long term (current) drug therapy: Secondary | ICD-10-CM | POA: Diagnosis not present

## 2020-04-07 ENCOUNTER — Ambulatory Visit (HOSPITAL_COMMUNITY)
Admission: RE | Admit: 2020-04-07 | Discharge: 2020-04-07 | Disposition: A | Discharge status: Home / Self Care | Payer: Medicare (Managed Care) | Attending: Psychiatry | Admitting: Psychiatry

## 2020-04-07 ENCOUNTER — Other Ambulatory Visit: Payer: Self-pay

## 2020-04-07 ENCOUNTER — Ambulatory Visit (HOSPITAL_COMMUNITY)
Admission: EM | Admit: 2020-04-07 | Discharge: 2020-04-08 | Disposition: A | Discharge status: Home / Self Care | Payer: Medicare (Managed Care) | Attending: Psychiatric/Mental Health | Admitting: Psychiatric/Mental Health

## 2020-04-07 DIAGNOSIS — Z20822 Contact with and (suspected) exposure to covid-19: Secondary | ICD-10-CM | POA: Diagnosis not present

## 2020-04-07 DIAGNOSIS — F250 Schizoaffective disorder, bipolar type: Secondary | ICD-10-CM

## 2020-04-07 DIAGNOSIS — Z79899 Other long term (current) drug therapy: Secondary | ICD-10-CM | POA: Insufficient documentation

## 2020-04-07 LAB — URINALYSIS, ROUTINE W REFLEX MICROSCOPIC
Bacteria, UA: NONE SEEN
Bilirubin Urine: NEGATIVE
Glucose, UA: NEGATIVE mg/dL
Ketones, ur: 5 mg/dL — AB
Leukocytes,Ua: NEGATIVE
Nitrite: NEGATIVE
Protein, ur: NEGATIVE mg/dL
Specific Gravity, Urine: 1.025 (ref 1.005–1.030)
pH: 5 (ref 5.0–8.0)

## 2020-04-07 LAB — POCT URINE DRUG SCREEN - MANUAL ENTRY (I-SCREEN)
POC Amphetamine UR: NOT DETECTED
POC Buprenorphine (BUP): NOT DETECTED
POC Cocaine UR: NOT DETECTED
POC Marijuana UR: NOT DETECTED
POC Methadone UR: NOT DETECTED
POC Methamphetamine UR: NOT DETECTED
POC Morphine: NOT DETECTED
POC Oxazepam (BZO): NOT DETECTED
POC Oxycodone UR: NOT DETECTED
POC Secobarbital (BAR): NOT DETECTED

## 2020-04-07 LAB — CBC WITH DIFFERENTIAL/PLATELET
Abs Immature Granulocytes: 0.05 10*3/uL (ref 0.00–0.07)
Basophils Absolute: 0.1 10*3/uL (ref 0.0–0.1)
Basophils Relative: 1 %
Eosinophils Absolute: 0 10*3/uL (ref 0.0–0.5)
Eosinophils Relative: 0 %
HCT: 40.3 % (ref 36.0–46.0)
Hemoglobin: 13.4 g/dL (ref 12.0–15.0)
Immature Granulocytes: 0 %
Lymphocytes Relative: 18 %
Lymphs Abs: 2.1 10*3/uL (ref 0.7–4.0)
MCH: 29.8 pg (ref 26.0–34.0)
MCHC: 33.3 g/dL (ref 30.0–36.0)
MCV: 89.8 fL (ref 80.0–100.0)
Monocytes Absolute: 0.6 10*3/uL (ref 0.1–1.0)
Monocytes Relative: 5 %
Neutro Abs: 9.2 10*3/uL — ABNORMAL HIGH (ref 1.7–7.7)
Neutrophils Relative %: 76 %
Platelets: 318 10*3/uL (ref 150–400)
RBC: 4.49 MIL/uL (ref 3.87–5.11)
RDW: 13.1 % (ref 11.5–15.5)
WBC: 12.1 10*3/uL — ABNORMAL HIGH (ref 4.0–10.5)
nRBC: 0 % (ref 0.0–0.2)

## 2020-04-07 LAB — COMPREHENSIVE METABOLIC PANEL
ALT: 29 U/L (ref 0–44)
AST: 24 U/L (ref 15–41)
Albumin: 4.1 g/dL (ref 3.5–5.0)
Alkaline Phosphatase: 92 U/L (ref 38–126)
Anion gap: 15 (ref 5–15)
BUN: 11 mg/dL (ref 6–20)
CO2: 21 mmol/L — ABNORMAL LOW (ref 22–32)
Calcium: 8.9 mg/dL (ref 8.9–10.3)
Chloride: 104 mmol/L (ref 98–111)
Creatinine, Ser: 0.73 mg/dL (ref 0.44–1.00)
GFR, Estimated: >60 mL/min (ref >60)
Glucose, Bld: 200 mg/dL — ABNORMAL HIGH (ref 70–99)
Potassium: 3.5 mmol/L (ref 3.5–5.1)
Sodium: 140 mmol/L (ref 135–145)
Total Bilirubin: 0.3 mg/dL (ref 0.3–1.2)
Total Protein: 7 g/dL (ref 6.5–8.1)

## 2020-04-07 LAB — RESP PANEL BY RT-PCR (FLU A&B, COVID) ARPGX2
Influenza A by PCR: NEGATIVE
Influenza B by PCR: NEGATIVE
SARS Coronavirus 2 by RT PCR: NEGATIVE

## 2020-04-07 LAB — HEMOGLOBIN A1C
Hgb A1c MFr Bld: 5.6 % (ref 4.8–5.6)
Mean Plasma Glucose: 114.02 mg/dL

## 2020-04-07 LAB — TSH: TSH: 1.479 u[IU]/mL (ref 0.350–4.500)

## 2020-04-07 LAB — LIPID PANEL
Cholesterol: 188 mg/dL (ref 0–200)
HDL: 65 mg/dL (ref >40)
LDL Cholesterol: 112 mg/dL — ABNORMAL HIGH (ref 0–99)
Total CHOL/HDL Ratio: 2.9 RATIO
Triglycerides: 55 mg/dL (ref <150)
VLDL: 11 mg/dL (ref 0–40)

## 2020-04-07 LAB — POC SARS CORONAVIRUS 2 AG -  ED: SARS Coronavirus 2 Ag: NEGATIVE

## 2020-04-07 LAB — POCT PREGNANCY, URINE: Preg Test, Ur: NEGATIVE

## 2020-04-07 LAB — PREGNANCY, URINE: Preg Test, Ur: NEGATIVE

## 2020-04-07 LAB — POC SARS CORONAVIRUS 2 AG: SARS Coronavirus 2 Ag: NEGATIVE

## 2020-04-07 LAB — ETHANOL: Alcohol, Ethyl (B): <10 mg/dL (ref <10)

## 2020-04-07 MED ORDER — AMANTADINE HCL 100 MG PO CAPS
100.0000 mg | ORAL_CAPSULE | Freq: Every day | ORAL | Status: DC
  Administered 2020-04-08: 100 mg via ORAL
  Filled 2020-04-07: qty 1

## 2020-04-07 MED ORDER — OLANZAPINE 7.5 MG PO TABS
15.0000 mg | ORAL_TABLET | Freq: Every day | ORAL | Status: DC
  Administered 2020-04-07: 15 mg via ORAL
  Filled 2020-04-07: qty 2

## 2020-04-07 MED ORDER — ACETAMINOPHEN 325 MG PO TABS: 650.0000 mg | ORAL_TABLET | Freq: Four times a day (QID) | ORAL | Status: DC | PRN

## 2020-04-07 MED ORDER — OLANZAPINE 5 MG PO TBDP: 5.0000 mg | ORAL_TABLET | Freq: Every day | ORAL | Status: DC | PRN

## 2020-04-07 MED ORDER — DOCUSATE SODIUM 100 MG PO CAPS
100.0000 mg | ORAL_CAPSULE | Freq: Every day | ORAL | Status: DC
  Administered 2020-04-08: 100 mg via ORAL
  Filled 2020-04-07: qty 1

## 2020-04-07 MED ORDER — ALUM & MAG HYDROXIDE-SIMETH 200-200-20 MG/5ML PO SUSP: 30.0000 mL | ORAL | Status: DC | PRN

## 2020-04-07 MED ORDER — MAGNESIUM HYDROXIDE 400 MG/5ML PO SUSP: 30.0000 mL | Freq: Every day | ORAL | Status: DC | PRN

## 2020-04-07 MED ORDER — VITAMIN D 25 MCG (1000 UNIT) PO TABS
2000.0000 [IU] | ORAL_TABLET | Freq: Every day | ORAL | Status: DC
  Administered 2020-04-08: 2000 [IU] via ORAL
  Filled 2020-04-07: qty 2

## 2020-04-07 MED ORDER — LAMOTRIGINE 25 MG PO TABS
25.0000 mg | ORAL_TABLET | Freq: Every day | ORAL | Status: DC
  Administered 2020-04-08: 25 mg via ORAL
  Filled 2020-04-07: qty 1

## 2020-04-07 MED ORDER — TRAZODONE HCL 50 MG PO TABS: 50.0000 mg | ORAL_TABLET | Freq: Every evening | ORAL | Status: DC | PRN

## 2020-04-07 MED ORDER — HYDROXYZINE HCL 25 MG PO TABS: 25.0000 mg | ORAL_TABLET | Freq: Four times a day (QID) | ORAL | Status: DC | PRN

## 2020-04-07 NOTE — ED Notes (Signed)
Pt asleep in bed. Respirations even and unlabored. Will continue to monitor for safety. ?

## 2020-04-07 NOTE — ED Provider Notes (Signed)
Behavioral Health Admission H&P [facility])  Date: 04/07/20 Patient Name: Jessica [name] MRN: [id number] Chief Complaint: No chief complaint on file.     Diagnoses:  Final diagnoses:  Schizoaffective disorder, bipolar type [facility])    HPI:  Patient presents voluntarily to [facility] behavioral health center, transferred from [facility] where she was a walk-in.  Patient reports she is seen twice weekly by her [facility] act team.  Patient has history of schizoaffective disorder, bipolar type.  Patient reports compliance with medications.  Patient reports [facility] act team transported patient to call behavioral health today related to increased auditory hallucinations.  Patient reports "I hear voices, like a conversation in my head, right now."  Patient reports increasing auditory hallucinations x2 weeks.  Patient denies command hallucinations currently.   Patient resides in [facility].  Patient denies access to weapons.  Patient receives disability benefits.  Patient enjoys taking walks, watching Netflix and reading books.  Patient denies alcohol and drug use.  Patient reports she believes she sleeps "too much."  Patient reports increased appetite, relates this to Zyprexa.  Patient assessed by nurse practitioner.  Patient alert and oriented, answers appropriately.  Patient pleasant cooperative during assessment.  Patient insightful, states "I wanted to come in before things get bad."  Patient denies suicidal ideations.  Patient denies visual hallucinations.  There is no evidence of delusional thought content.  Patient offered support and encouragement.   PHQ 2-9:     Total Time spent with patient: 30 minutes  Musculoskeletal  Strength & Muscle Tone: within normal limits Gait & Station: normal Patient leans: N/A  Psychiatric Specialty Exam  Presentation General Appearance: Appropriate for Environment; Casual  Eye Contact:Good  Speech:Clear and Coherent;  Normal Rate  Speech Volume:Normal  Handedness:Right   Mood and Affect  Mood:Anxious  Affect:Congruent; Appropriate   Thought Process  Thought Processes:Coherent; Goal Directed  Descriptions of Associations:Intact  Orientation:Full (Time, Place and Person)  Thought Content:Logical  Hallucinations:Hallucinations: Auditory Description of Auditory Hallucinations: conversation in my head  Ideas of Reference:None  Suicidal Thoughts:Suicidal Thoughts: No  Homicidal Thoughts:Homicidal Thoughts: No   Sensorium  Memory:Immediate Fair; Recent Fair; Remote Fair  Judgment:Fair  Insight:Good   Executive Functions  Concentration:Good  Attention Span:Good  Recall:Good  Fund of Knowledge:Good  Language:Good   Psychomotor Activity  Psychomotor Activity:Psychomotor Activity: Normal   Assets  Assets:Communication Skills; Desire for Improvement; Financial Resources/Insurance; Housing; Leisure Time; Physical Health; Resilience; Social Support; Intimacy; Talents/Skills   Sleep  Sleep:Sleep: Good   Physical Exam Vitals and nursing note reviewed.  Constitutional:      Appearance: She is well-developed.  HENT:     Head: Normocephalic.  Cardiovascular:     Rate and Rhythm: Normal rate.  Pulmonary:     Effort: Pulmonary effort is normal.  Neurological:     Mental Status: She is alert and oriented to person, place, and time.  Psychiatric:        Attention and Perception: Attention normal. She perceives auditory hallucinations.        Mood and Affect: Affect normal. Mood is anxious.        Speech: Speech normal.        Behavior: Behavior normal. Behavior is cooperative.        Thought Content: Thought content normal.        Cognition and Memory: Cognition normal.        Judgment: Judgment normal.    Review of Systems  Constitutional: Negative.  HENT: Negative.   Eyes: Negative.   Respiratory: Negative.   Cardiovascular: Negative.   Gastrointestinal:  Negative.   Genitourinary: Negative.   Musculoskeletal: Negative.   Skin: Negative.   Neurological: Negative.   Endo/Heme/Allergies: Negative.   Psychiatric/Behavioral: Positive for hallucinations. The patient is nervous/anxious.     Blood pressure (!) 157/89, pulse (!) 108, temperature 98.3 F (36.8 C), temperature source Oral, resp. rate 20, SpO2 97 %. There is no height or weight on file to calculate BMI.  Past Psychiatric History: Schizoaffective disorder bipolar type  Is the patient at risk to self? No  Has the patient been a risk to self in the past 6 months? No .    Has the patient been a risk to self within the distant past? No   Is the patient a risk to others? No   Has the patient been a risk to others in the past 6 months? No   Has the patient been a risk to others within the distant past? No   Past Medical History:  Past Medical History:  Diagnosis Date  . Anxiety   . Bipolar 1 disorder (HCC)   . Depression   . Schizophrenia (HCC)    No past surgical history on file.  Family History:  Family History  Problem Relation Age of Onset  . Hypertension Mother   . Mental illness Father     Social History:  Social History   Socioeconomic History  . Marital status: Single    Spouse name: Not on file  . Number of children: Not on file  . Years of education: Not on file  . Highest education level: Not on file  Occupational History  . Not on file  Tobacco Use  . Smoking status: Never Smoker  . Smokeless tobacco: Never Used  Substance and Sexual Activity  . Alcohol use: No  . Drug use: No  . Sexual activity: Never    Birth control/protection: Abstinence  Other Topics Concern  . Not on file  Social History Narrative  . Not on file   Social Determinants of Health   Financial Resource Strain: Not on file  Food Insecurity: Not on file  Transportation Needs: Not on file  Physical Activity: Not on file  Stress: Not on file  Social Connections: Not on file   Intimate Partner Violence: Not on file    SDOH:  SDOH Screenings   Alcohol Screen: Not on file  Depression (ATF5-7): Not on file  Financial Resource Strain: Not on file  Food Insecurity: Not on file  Housing: Not on file  Physical Activity: Not on file  Social Connections: Not on file  Stress: Not on file  Tobacco Use: Not on file  Transportation Needs: Not on file    Last Labs:  Admission on 04/07/2020  Component Date Value Ref Range Status  . SARS Coronavirus 2 Ag 04/07/2020 Negative  Negative Final  . POC Amphetamine UR 04/07/2020 None Detected  NONE DETECTED (Cut Off Level 1000 ng/mL) Final  . POC Secobarbital (BAR) 04/07/2020 None Detected  NONE DETECTED (Cut Off Level 300 ng/mL) Final  . POC Buprenorphine (BUP) 04/07/2020 None Detected  NONE DETECTED (Cut Off Level 10 ng/mL) Final  . POC Oxazepam (BZO) 04/07/2020 None Detected  NONE DETECTED (Cut Off Level 300 ng/mL) Final  . POC Cocaine UR 04/07/2020 None Detected  NONE DETECTED (Cut Off Level 300 ng/mL) Final  . POC Methamphetamine UR 04/07/2020 None Detected  NONE DETECTED (Cut Off Level 1000 ng/mL)  Final  . POC Morphine 04/07/2020 None Detected  NONE DETECTED (Cut Off Level 300 ng/mL) Final  . POC Oxycodone UR 04/07/2020 None Detected  NONE DETECTED (Cut Off Level 100 ng/mL) Final  . POC Methadone UR 04/07/2020 None Detected  NONE DETECTED (Cut Off Level 300 ng/mL) Final  . POC Marijuana UR 04/07/2020 None Detected  NONE DETECTED (Cut Off Level 50 ng/mL) Final  . SARS Coronavirus 2 Ag 04/07/2020 NEGATIVE  NEGATIVE Final   Comment: (NOTE) SARS-CoV-2 antigen NOT DETECTED.   Negative results are presumptive.  Negative results do not preclude SARS-CoV-2 infection and should not be used as the sole basis for treatment or other patient management decisions, including infection  control decisions, particularly in the presence of clinical signs and  symptoms consistent with COVID-19, or in those who have been in contact  with the virus.  Negative results must be combined with clinical observations, patient history, and epidemiological information. The expected result is Negative.  Fact Sheet for Patients: https://www.jennings-kim.com/  Fact Sheet for Healthcare Providers: https://alexander-rogers.biz/  This test is not yet approved or cleared by the [place] FDA and  has been authorized for detection and/or diagnosis of SARS-CoV-2 by FDA under an Emergency Use Authorization (EUA).  This EUA will remain in effect (meaning this test can be used) for the duration of  the COV                          ID-19 declaration under Section 564(b)(1) of the Act, 21 U.S.C. section 360bbb-3(b)(1), unless the authorization is terminated or revoked sooner.    . Preg Test, Ur 04/07/2020 NEGATIVE  NEGATIVE Final   Comment:        THE SENSITIVITY OF THIS METHODOLOGY IS >24 mIU/mL     Allergies: Prednisone and Abilify [aripiprazole]  PTA Medications: (Not in a hospital admission)   Medical Decision Making  Discussed restarting home medications with increase in Zyprexa, discussed side effects.  Patient agreement with plan. Patient reviewed with Dr. Bronwen Betters.  Medications:  -Haldol decannulated 200 mg every 30 days (last dose administered 03/20/2020) -Amantadine 100 mg daily -Vitamin D3 2000 units daily -Docusate sodium 100 mg daily -Lamictal 25 mg daily -Zyprexa 15 mg nightly -Hydroxyzine 25 mg 3 times daily as needed/anxiety -Trazodone 50 mg nightly as needed/sleep -Zyprexa 5 mg daily as needed/repeat x1/agitation  Recommendations  Based on my evaluation the patient does not appear to have an emergency medical condition.   Patient will be placed in the continuous assessment area at [facility] for treatment and stabilization.  Patient will be reassessed on 04/08/2020, disposition will be determined at that time.  Patrcia Dolly, FNP 04/07/20  7:38 PM

## 2020-04-07 NOTE — H&P (Signed)
Behavioral Health Medical Screening Exam  Jessica [name] is an 45 y.o. female with history of bipolar 1 disorder. She was dropped off by her [facility] ACT team staff member, who already left and is unable to be reached for collateral information. She is calm, cooperative, and pleasant on assessment but appears anxious. She reports onset of auditory hallucinations two days ago. She says the voices are talking to her and telling her a story. She reports CAH to do things like shout, stand up, and urinate in the bed. Denies CAH to hurt herself, but reports history of CAH to hurt herself and history of suicide attempts- "I'm trying to get help before it gets that bad." Chart review does show history of multiple hospitalizations, with last hospitalization at [facility] in January 2021. She denies current SI/HI. Speech is well-organized and logical. No paranoid or delusional thought content expressed.   I called [facility] crisis line, but staff member who answered was unable to provide further information on why patient had been dropped off at hospital by ACT team. She stated she would leave a message with staff member who was more familiar with patient.  Patient reports compliance with medications- Haloperidol Decanoate (last injection 03/20/20), amantadine 100 mg daily, docusate sodium 100 mg daily, Lamictal 25 mg daily, Zyprexa 10 mg QHS, and vitamin D. Denies any drug/alcohol use. She is unsure of trigger for auditory hallucinations. She reports that she has been on current dose of Zyprexa for one year. She is agreeable to overnight observation and increasing her nighttime dose of Zyprexa.  Total Time spent with patient: 30 minutes  Psychiatric Specialty Exam: Physical Exam Vitals reviewed.  Constitutional:      Appearance: She is well-developed and well-nourished.  Cardiovascular:     Rate and Rhythm: Normal rate.  Pulmonary:     Effort: Pulmonary effort is normal.  Neurological:     Mental Status: She is  alert and oriented to person, place, and time.    Review of Systems  Constitutional: Negative.   Respiratory: Negative for cough and shortness of breath.   Psychiatric/Behavioral: Positive for hallucinations. Negative for agitation, behavioral problems, confusion, decreased concentration, dysphoric mood, self-injury and suicidal ideas. The patient is nervous/anxious. The patient is not hyperactive.    Blood pressure (!) 142/89, pulse (!) 106, temperature 98.4 F (36.9 C), temperature source Oral, resp. rate 18, SpO2 98 %.There is no height or weight on file to calculate BMI. General Appearance: Casual Eye Contact:  Good Speech:  Normal Rate Volume:  Normal Mood:  Anxious Affect:  Congruent Thought Process:  Coherent Orientation:  Full (Time, Place, and Person) Thought Content:  Logical Suicidal Thoughts:  No Homicidal Thoughts:  No Memory:  Immediate;   Good Recent;   Good Remote;   Good Judgement:  Intact Insight:  Good Psychomotor Activity:  Normal Concentration: Concentration: Good and Attention Span: Good Recall:  Good Fund of Knowledge:Fair Language: Good Akathisia:  No Handed:  Right AIMS (if indicated):    Assets:  Communication Skills Desire for Improvement Financial Resources/Insurance Housing Leisure Time Social Support Sleep:     Musculoskeletal: Strength & Muscle Tone: within normal limits Gait & Station: normal Patient leans: N/A  Blood pressure (!) 142/89, pulse (!) 106, temperature 98.4 F (36.9 C), temperature source Oral, resp. rate 18, SpO2 98 %.  Recommendations: Based on my evaluation the patient does not appear to have an emergency medical condition.  Overnight observation. Patient transferring to [facility] via Psychologist, educational.  Marcelyn Ditty  Gerilyn Pilgrim, NP 04/07/2020, 6:00 PM

## 2020-04-07 NOTE — BH Assessment (Addendum)
Comprehensive Clinical Assessment (CCA) Note   Patient is a 45 y.o. female that  presents to [facility] as a walk-in. She is diagnosed with Bipolar I Disorder. Patient transported to [facility] (voluntarily) by her ACTT provider. She is currently under the care of Psychotherapeutic [facility]. She see's her ACTT provider 2x/week. As of March 20, 2020 she started to experience auditory hallucinations. She describes them as multiple voices and "story's in my head". The voices are command type and tell her to shout out loud, get up, urinate in the bed, and stand up. Last year she experienced very similar symptoms that became so bad that she needed to be hospitalized. States that she was hospitalized multiple times, "back to back". In order to prevent these occurrences she asked her ACTT provider to bring her to [facility]. States, "I want to get help before it gets bad".   She does not have suicidal ideations. However, had suicidal ideations in the past triggered by her auditory hallucinations. She has tried to commit suicide 2x's in the past. The suicide attempts were 2008 and 2010 (overdoses). Each suicide attempt was triggered by auditory hallucinations. Patient denies history of self mutilating behaviors. No access to firearms. Denies depressive symptoms. She recently starting sleeping more. She reports sleeping 14-15 hrs per day. Her appetite is "up and down". She has gained 18 pounds recently due to her psychotropic medications. Denis homicidal ideations. No history of aggressive and/or assaultive behaviors. No legal issues. Denies visual hallucinations, delusions, and paranoia. Denies alcohol and drug use. She is compliant with all medications and therapy provided by her ACTT provider. She has received inpatient psychiatric hospitalization at [facility] multiple times, [facility] -3x's, and a facility in [place] in 2008.   Disposition: Per Marciano Sequin, NP, patient to be admitted to the [facility] for overnight observation and medication  adjustments. Pending am psych evaluation.    04/07/2020 Jessica [name] [id number]  Chief Complaint: No chief complaint on file.  Visit Diagnosis: Bipolar I Disorder    CCA Screening, Triage and Referral (STR)  Patient Reported Information How did you hear about [place]? No data recorded Referral name: ACTT: [facility]  Referral phone number: No data recorded  Whom do you see for routine medical problems? -- (It's unknown if pattient has a PCP. She does see her ACTT provider 2x's per week.)  Practice/Facility Name: No data recorded Practice/Facility Phone Number: No data recorded Name of Contact: No data recorded Contact Number: No data recorded Contact Fax Number: No data recorded Prescriber Name: No data recorded Prescriber Address (if known): No data recorded  What Is the Reason for Your Visit/Call Today? Auditory hallucinations; Bipolar I Disorder  How Long Has This Been Causing You Problems? > than 6 months  What Do You Feel Would Help You the Most Today? Medication (Inpatient treatment)   Have You Recently Been in Any Inpatient Treatment (Hospital/Detox/Crisis Center/28-Day Program)? No  Name/Location of Program/Hospital:No data recorded How Long Were You There? No data recorded When Were You Discharged? No data recorded  Have You Ever Received Services From [facility] Before? No  Who Do You See at [facility]? No data recorded  Have You Recently Had Any Thoughts About Hurting Yourself? No  Are You Planning to Commit Suicide/Harm Yourself At This time? No   Have you Recently Had Thoughts About Hurting Someone Karolee Ohs? No  Explanation: No data recorded  Have You Used Any Alcohol or Drugs in the Past 24 Hours? No  How Long Ago Did You Use Drugs  or Alcohol? No data recorded What Did You Use and How Much? No data recorded  Do You Currently Have a Therapist/Psychiatrist? Yes  Name of Therapist/Psychiatrist: [facility] (ACTT services)   Have You Been Recently Discharged  From Any Office Practice or Programs? No  Explanation of Discharge From Practice/Program: No data recorded    CCA Screening Triage Referral Assessment Type of Contact: Face-to-Face  Is this Initial or Reassessment? No data recorded Date Telepsych consult ordered in [facility]:  No data recorded Time Telepsych consult ordered in [facility]:  No data recorded  Patient Reported Information Reviewed? Yes  Patient Left Without Being Seen? No data recorded Reason for Not Completing Assessment: No data recorded  Collateral Involvement: Contacted ACTT provider ([facility]) and left a message with the person answering the crises line.   Does Patient Have a Automotive engineer Guardian? No data recorded Name and Contact of Legal Guardian: No data recorded If Minor and Not Living with Parent(s), Who has Custody? No data recorded Is CPS involved or ever been involved? Never  Is APS involved or ever been involved? Never   Patient Determined To Be At Risk for Harm To Self or Others Based on Review of Patient Reported Information or Presenting Complaint? Yes, for Self-Harm  Method: No data recorded Availability of Means: No data recorded Intent: No data recorded Notification Required: No data recorded Additional Information for Danger to Others Potential: No data recorded Additional Comments for Danger to Others Potential: No data recorded Are There Guns or Other Weapons in Your Home? No data recorded Types of Guns/Weapons: No data recorded Are These Weapons Safely Secured?                            No data recorded Who Could Verify You Are Able To Have These Secured: No data recorded Do You Have any Outstanding Charges, Pending Court Dates, Parole/Probation? No data recorded Contacted To Inform of Risk of Harm To Self or Others: No data recorded  Location of Assessment: -- ([facility] walk in)   Does Patient Present under Involuntary Commitment? No  IVC Papers Initial File Date: No data recorded  [place]  of Residence: [place]   Patient Currently Receiving the Following Services: ACTT Psychologist, educational)   Determination of Need: Emergent (2 hours)   Options For Referral: Medication Management; Other: Comment (Overnight Observation)     CCA Biopsychosocial Intake/Chief Complaint:  depressive symptoms  Current Symptoms/Problems: auditory hallucinations; Bipolar I Disorder   Patient Reported Schizophrenia/Schizoaffective Diagnosis in Past: No   Strengths: great insight on her symptoms and able to communicate those symptoms effectively  Preferences: unk  Abilities: unk   Type of Services Patient Feels are Needed: unk   Initial Clinical Notes/Concerns: unk   Mental Health Symptoms Depression:  Difficulty Concentrating   Duration of Depressive symptoms: Less than two weeks   Mania:  None   Anxiety:   Difficulty concentrating; Worrying   Psychosis:  None   Duration of Psychotic symptoms: No data recorded  Trauma:  None   Obsessions:  None   Compulsions:  None   Inattention:  None   Hyperactivity/Impulsivity:  N/A   Oppositional/Defiant Behaviors:  None   Emotional Irregularity:  None   Other Mood/Personality Symptoms:  anxious    Mental Status Exam Appearance and self-care  Stature:  Average   Weight:  Average weight   Clothing:  Age-appropriate   Grooming:  Normal   Cosmetic use:  Age  appropriate   Posture/gait:  Normal   Motor activity:  Not Remarkable   Sensorium  Attention:  Normal   Concentration:  Normal   Orientation:  X5   Recall/memory:  Normal   Affect and Mood  Affect:  Blunted; Anxious   Mood:  Depressed   Relating  Eye contact:  Normal   Facial expression:  Anxious   Attitude toward examiner:  Cooperative   Thought and Language  Speech flow: Clear and Coherent   Thought content:  Appropriate to Mood and Circumstances   Preoccupation:  No data recorded  Hallucinations:  Auditory    Organization:  No data recorded  [employer] of Knowledge:  Average   Intelligence:  Average   Abstraction:  Concrete   Judgement:  Normal   Reality Testing:  Adequate   Insight:  Good   Decision Making:  Normal   Social Functioning  Social Maturity:  Responsible   Social Judgement:  Normal   Stress  Stressors:  -- (auditory hallucinations)   Coping Ability:  Normal; Resilient   Skill Deficits:  Self-control (hearing voices in the past has led to her hurting herself)   Supports:  -- (mother and ACTT providers)     Religion:    Leisure/Recreation:    Exercise/Diet:     CCA Employment/Education Employment/Work Situation:    Education:     CCA Family/Childhood History Family and Relationship History:    Childhood History:     Child/Adolescent Assessment:     CCA Substance Use Alcohol/Drug Use:                           ASAM's:  Six Dimensions of Multidimensional Assessment  Dimension 1:  Acute Intoxication and/or Withdrawal Potential:      Dimension 2:  Biomedical Conditions and Complications:      Dimension 3:  Emotional, Behavioral, or Cognitive Conditions and Complications:     Dimension 4:  Readiness to Change:     Dimension 5:  Relapse, Continued use, or Continued Problem Potential:     Dimension 6:  Recovery/Living Environment:     ASAM Severity Score:    ASAM Recommended Level of Treatment:     Substance use Disorder (SUD)    Recommendations for Services/Supports/Treatments:    DSM5 Diagnoses: Patient Active Problem List   Diagnosis Date Noted  . IBS (irritable bowel syndrome) 03/21/2016  . Schizoaffective disorder, bipolar type (HCC) 07/25/2015    Patient Centered Plan: Patient is on the following Treatment Plan(s):  Bipolar I Disorder  Referrals to Alternative Service(s): Referred to Alternative Service(s):   Place:   Date:   Time:    Referred to Alternative Service(s):   Place:   Date:    Time:    Referred to Alternative Service(s):   Place:   Date:   Time:    Referred to Alternative Service(s):   Place:   Date:   Time:     Melynda Ripple, CounselorComprehensive Clinical Assessment (CCA) Screening, Triage and Referral Note  04/07/2020 Jessica [name] [id number]  Chief Complaint: No chief complaint on file.  Visit Diagnosis: Bipolar I Disorder   Patient Reported Information How did you hear about [place]? No data recorded  Referral name: ACTT: [facility]   Referral phone number: No data recorded Whom do you see for routine medical problems? -- (It's unknown if pattient has a PCP. She does see her ACTT provider 2x's per week.)   Practice/Facility Name:  No data recorded  Practice/Facility Phone Number: No data recorded  Name of Contact: No data recorded  Contact Number: No data recorded  Contact Fax Number: No data recorded  Prescriber Name: No data recorded  Prescriber Address (if known): No data recorded What Is the Reason for Your Visit/Call Today? Auditory hallucinations; Bipolar I Disorder  How Long Has This Been Causing You Problems? > than 6 months  Have You Recently Been in Any Inpatient Treatment (Hospital/Detox/Crisis Center/28-Day Program)? No   Name/Location of Program/Hospital:No data recorded  How Long Were You There? No data recorded  When Were You Discharged? No data recorded Have You Ever Received Services From [facility] Before? No   Who Do You See at [facility]? No data recorded Have You Recently Had Any Thoughts About Hurting Yourself? No   Are You Planning to Commit Suicide/Harm Yourself At This time?  No  Have you Recently Had Thoughts About Hurting Someone Karolee Ohs? No   Explanation: No data recorded Have You Used Any Alcohol or Drugs in the Past 24 Hours? No   How Long Ago Did You Use Drugs or Alcohol?  No data recorded  What Did You Use and How Much? No data recorded What Do You Feel Would Help You the Most Today? Medication (Inpatient  treatment)  Do You Currently Have a Therapist/Psychiatrist? Yes   Name of Therapist/Psychiatrist: [facility] (ACTT services)   Have You Been Recently Discharged From Any Office Practice or Programs? No   Explanation of Discharge From Practice/Program:  No data recorded    CCA Screening Triage Referral Assessment Type of Contact: Face-to-Face   Is this Initial or Reassessment? No data recorded  Date Telepsych consult ordered in [facility]:  No data recorded  Time Telepsych consult ordered in [facility]:  No data recorded Patient Reported Information Reviewed? Yes   Patient Left Without Being Seen? No data recorded  Reason for Not Completing Assessment: No data recorded Collateral Involvement: Contacted ACTT provider ([facility]) and left a message with the person answering the crises line.  Does Patient Have a Automotive engineer Guardian? No data recorded  Name and Contact of Legal Guardian:  No data recorded If Minor and Not Living with Parent(s), Who has Custody? No data recorded Is CPS involved or ever been involved? Never  Is APS involved or ever been involved? Never  Patient Determined To Be At Risk for Harm To Self or Others Based on Review of Patient Reported Information or Presenting Complaint? Yes, for Self-Harm   Method: No data recorded  Availability of Means: No data recorded  Intent: No data recorded  Notification Required: No data recorded  Additional Information for Danger to Others Potential:  No data recorded  Additional Comments for Danger to Others Potential:  No data recorded  Are There Guns or Other Weapons in Your Home?  No data recorded   Types of Guns/Weapons: No data recorded   Are These Weapons Safely Secured?                              No data recorded   Who Could Verify You Are Able To Have These Secured:    No data recorded Do You Have any Outstanding Charges, Pending Court Dates, Parole/Probation? No data recorded Contacted To Inform of Risk of Harm To Self or  Others: No data recorded Location of Assessment: -- ([facility] walk in)  Does Patient Present under Involuntary Commitment? No  IVC Papers Initial File Date: No data recorded  [place]  Patient Currently Receiving the Following Services: ACTT Psychologist, educational([facility])   Determination of Need: Emergent (2 hours)   Options For Referral: Medication Management; Other: Comment (Overnight Observation)   Melynda Rippleoyka Kaydynce Pat, Counselor

## 2020-04-07 NOTE — ED Notes (Signed)
Pt admitted to continuous assessment due to auditory hallucinations. Pt tolerated lab work and skin assessment well. Pt A&O x4, calm and cooperative. Pt ambulated independently to unit. Oriented to unit/staff. No signs of acute distress noted. Will continue to monitor for safety.

## 2020-04-08 DIAGNOSIS — F250 Schizoaffective disorder, bipolar type: Secondary | ICD-10-CM

## 2020-04-08 MED ORDER — OLANZAPINE 20 MG PO TABS: 20.0000 mg | ORAL_TABLET | Freq: Every day | ORAL | 0 refills | Status: AC

## 2020-04-08 NOTE — ED Provider Notes (Signed)
FBC/OBS ASAP Discharge Summary  Date and Time: 04/08/2020 11:44 AM  Name: Jessica [name]  MRN:  [id number]   Discharge Diagnoses:  Final diagnoses:  Schizoaffective disorder, bipolar type [facility])    Subjective: Patient reports today that she is feeling little bit better.  She states that she is still having some hallucinations but is unable to give specifics on it.  She denies having any suicidal or homicidal ideations.  Patient reports that she has an apartment that she lives in by herself but states that she does not want to be admitted to the hospital.  She is currently not describing any hallucinations that are command in nature and has no thoughts of wanting harm herself or anyone else.  After discussing with her about her medications agreed to increase her Zyprexa to 20 mg.  Patient was provided with a new prescription for medications.  She is also instructed to follow-up with her ACT team.    Stay Summary: Patient is a 45 year old female that presented to the [facility] after being transferred from the [facility] portion presented as a walk-in voluntarily.  Patient has a history of schizoaffective bipolar type and reports that she has been compliant with her medications and that she has been having worsening hallucinations and states that the voices are like a conversation in her head.  Patient was admitted to the continuous observation unit and restarted on her home medications and her Zyprexa was increased.  Today the patient is reporting that she is feeling some better and that she continues to deny any suicidal homicidal ideations and denies any significant hallucinations today.  She states that he does have an from time to time but they do not appear to be as severe today and states that she does not want to be admitted to the hospital.  She states that she will contact her ACT team as they do come and see her twice a week.  Patient was provided with safe transport back to her home and was provided with  a prescription for her increased Zyprexa.  Patient continued to deny having any suicidal or homicidal ideations at discharge.  Total Time spent with patient: 30 minutes  Past Psychiatric History: Bipolar 1 disorder, schizophrenia, depression, anxiety, previous hospitalizations, current with [facility] Past Medical History:  Past Medical History:  Diagnosis Date  . Anxiety   . Bipolar 1 disorder (HCC)   . Depression   . Schizophrenia (HCC)    No past surgical history on file. Family History:  Family History  Problem Relation Age of Onset  . Hypertension Mother   . Mental illness Father    Family Psychiatric History: None reported Social History:  Social History   Substance and Sexual Activity  Alcohol Use No     Social History   Substance and Sexual Activity  Drug Use No    Social History   Socioeconomic History  . Marital status: Single    Spouse name: Not on file  . Number of children: Not on file  . Years of education: Not on file  . Highest education level: Not on file  Occupational History  . Not on file  Tobacco Use  . Smoking status: Never Smoker  . Smokeless tobacco: Never Used  Substance and Sexual Activity  . Alcohol use: No  . Drug use: No  . Sexual activity: Never    Birth control/protection: Abstinence  Other Topics Concern  . Not on file  Social History Narrative  . Not  on file   Social Determinants of Health   Financial Resource Strain: Not on file  Food Insecurity: Not on file  Transportation Needs: Not on file  Physical Activity: Not on file  Stress: Not on file  Social Connections: Not on file   SDOH:  SDOH Screenings   Alcohol Screen: Not on file  Depression (PHQ2-9): Not on file  Financial Resource Strain: Not on file  Food Insecurity: Not on file  Housing: Not on file  Physical Activity: Not on file  Social Connections: Not on file  Stress: Not on file  Tobacco Use: Not on file  Transportation Needs: Not on file    Has  this patient used any form of tobacco in the last 30 days? (Cigarettes, Smokeless Tobacco, Cigars, and/or Pipes) A prescription for an FDA-approved tobacco cessation medication was offered at discharge and the patient refused  Current Medications:  Current Facility-Administered Medications  Medication Dose Route Frequency Provider Last Rate Last Admin  . acetaminophen (TYLENOL) tablet 650 mg  650 mg Oral Q6H PRN Aldean Baker, NP      . alum & mag hydroxide-simeth (MAALOX/MYLANTA) 200-200-20 MG/5ML suspension 30 mL  30 mL Oral Q4H PRN Aldean Baker, NP      . amantadine (SYMMETREL) capsule 100 mg  100 mg Oral Daily Aldean Baker, NP   100 mg at 04/08/20 0905  . cholecalciferol (VITAMIN D3) tablet 2,000 Units  2,000 Units Oral Daily Aldean Baker, NP   2,000 Units at 04/08/20 0905  . docusate sodium (COLACE) capsule 100 mg  100 mg Oral Daily Aldean Baker, NP   100 mg at 04/08/20 3710  . hydrOXYzine (ATARAX/VISTARIL) tablet 25 mg  25 mg Oral Q6H PRN Aldean Baker, NP      . lamoTRIgine (LAMICTAL) tablet 25 mg  25 mg Oral Daily Aldean Baker, NP   25 mg at 04/08/20 0905  . magnesium hydroxide (MILK OF MAGNESIA) suspension 30 mL  30 mL Oral Daily PRN Aldean Baker, NP      . OLANZapine (ZYPREXA) tablet 15 mg  15 mg Oral QHS Aldean Baker, NP   15 mg at 04/07/20 2105  . OLANZapine zydis (ZYPREXA) disintegrating tablet 5 mg  5 mg Oral Daily PRN Aldean Baker, NP      . traZODone (DESYREL) tablet 50 mg  50 mg Oral QHS PRN,MR X 1 Aldean Baker, NP       Current Outpatient Medications  Medication Sig Dispense Refill  . docusate sodium (COLACE) 100 MG capsule Take 100 mg by mouth 2 (two) times daily.    Marland Kitchen amantadine (SYMMETREL) 100 MG capsule Take 1 capsule (100 mg total) by mouth 2 (two) times daily. 60 capsule 0  . haloperidol decanoate (HALDOL DECANOATE) 100 MG/ML injection Inject 200 mg into the muscle every 30 (thirty) days.    Marland Kitchen lamoTRIgine (LAMICTAL) 100 MG tablet Take 100 mg by mouth  daily.    Marland Kitchen OLANZapine (ZYPREXA) 20 MG tablet Take 1 tablet (20 mg total) by mouth at bedtime. 30 tablet 0    PTA Medications: (Not in a hospital admission)   Musculoskeletal  Strength & Muscle Tone: within normal limits Gait & Station: normal Patient leans: N/A  Psychiatric Specialty Exam  Presentation  General Appearance: Appropriate for Environment; Casual  Eye Contact:Good  Speech:Clear and Coherent; Normal Rate  Speech Volume:Decreased  Handedness:Right   Mood and Affect  Mood:Anxious  Affect:Appropriate; Congruent   Thought Process  Thought Processes:Coherent  Descriptions of Associations:Intact  Orientation:Full (Time, Place and Person)  Thought Content:WDL  Hallucinations:Hallucinations: Auditory Description of Auditory Hallucinations: conversation in my head  Ideas of Reference:None  Suicidal Thoughts:Suicidal Thoughts: No  Homicidal Thoughts:Homicidal Thoughts: No   Sensorium  Memory:Immediate Good; Recent Good; Remote Good  Judgment:Fair  Insight:Good   Executive Functions  Concentration:Good  Attention Span:Good  Recall:Good  Fund of Knowledge:Good  Language:Good   Psychomotor Activity  Psychomotor Activity:Psychomotor Activity: Normal   Assets  Assets:Communication Skills; Desire for Improvement; Financial Resources/Insurance; Housing; Social Support; Transportation   Sleep  Sleep:Sleep: Good   Physical Exam  Physical Exam Vitals and nursing note reviewed.  Constitutional:      Appearance: She is well-developed.  HENT:     Head: Normocephalic.  Eyes:     Pupils: Pupils are equal, round, and reactive to light.  Cardiovascular:     Rate and Rhythm: Normal rate.  Pulmonary:     Effort: Pulmonary effort is normal.  Musculoskeletal:        General: Normal range of motion.  Neurological:     Mental Status: She is alert and oriented to person, place, and time.    Review of Systems  Constitutional: Negative.    HENT: Negative.   Eyes: Negative.   Respiratory: Negative.   Cardiovascular: Negative.   Gastrointestinal: Negative.   Genitourinary: Negative.   Musculoskeletal: Negative.   Skin: Negative.   Neurological: Negative.   Endo/Heme/Allergies: Negative.   Psychiatric/Behavioral: Positive for hallucinations. Negative for suicidal ideas.   Blood pressure (!) 144/90, pulse 99, temperature 97.9 F (36.6 C), temperature source Oral, resp. rate 18, SpO2 100 %. There is no height or weight on file to calculate BMI.  Demographic Factors:  Caucasian and Living alone  Loss Factors: NA  Historical Factors: NA  Risk Reduction Factors:   Positive social support, Positive therapeutic relationship and Positive coping skills or problem solving skills  Continued Clinical Symptoms:  Previous Psychiatric Diagnoses and Treatments  Cognitive Features That Contribute To Risk:  None    Suicide Risk:  Minimal: No identifiable suicidal ideation.  Patients presenting with no risk factors but with morbid ruminations; may be classified as minimal risk based on the severity of the depressive symptoms  Plan Of Care/Follow-up recommendations:  Continue activity as tolerated. Continue diet as recommended by your PCP. Ensure to keep all appointments with outpatient providers.  Disposition: Discharge home   Jessica Bunnell, FNP 04/08/2020, 11:44 AM

## 2020-04-08 NOTE — Progress Notes (Signed)
[name] to be D/C'd Home per MD order.  Discussed with the patient and all questions fully answered.  An After Visit Summary was printed and given to the patient. Patient received prescription.  D/c education completed with patient/family including follow up with ACT TEAM., instructions, medication list, d/c activities limitations if indicated, with other d/c instructions as indicated by MD - patient able to verbalize understanding, all questions fully answered.   Patient instructed to return to ED, call 911, or call MD for any changes in condition.   Patient escorted via Safety transport.  Leamon Arnt 04/08/2020 12:12 PM

## 2020-04-08 NOTE — Progress Notes (Signed)
Patient is alert and oriented X 3, denies SI, and  HI. Patient reports auditory hallucinations. Patient seems to be anxious this morning when receiving medications. Patient logical and coherent very quiet and soft spoken. Nursing staff will continue to monitor.

## 2020-04-08 NOTE — ED Notes (Signed)
Pt asleep in bed. Respirations even and unlabored. Will continue to monitor for safety. ?

## 2020-04-08 NOTE — Discharge Instructions (Addendum)

## 2020-04-09 LAB — PROLACTIN: Prolactin: 25.4 ng/mL — ABNORMAL HIGH (ref 4.8–23.3)

## 2020-04-15 ENCOUNTER — Telehealth (HOSPITAL_COMMUNITY): Payer: Self-pay | Admitting: Physician Assistant

## 2020-04-15 NOTE — Telephone Encounter (Signed)
Care Management - Follow Up [facility] Discharges   Writer attempted to make contact with patient today and was unsuccessful.  Writer was able to leave a HIPPA compliant voice message and will await callback.   

## 2022-10-11 ENCOUNTER — Other Ambulatory Visit: Payer: Self-pay | Admitting: Physician Assistant

## 2022-10-11 DIAGNOSIS — Z1231 Encounter for screening mammogram for malignant neoplasm of breast: Secondary | ICD-10-CM
